# Patient Record
Sex: Female | Born: 1935
Health system: Southern US, Community
[De-identification: ages and names within clinical notes are randomized; demographics above are authoritative.]

## PROBLEM LIST (undated history)

## (undated) DIAGNOSIS — M1990 Unspecified osteoarthritis, unspecified site: Secondary | ICD-10-CM

## (undated) DIAGNOSIS — Z860100 Personal history of colon polyps, unspecified: Secondary | ICD-10-CM

## (undated) DIAGNOSIS — E785 Hyperlipidemia, unspecified: Secondary | ICD-10-CM

## (undated) DIAGNOSIS — D696 Thrombocytopenia, unspecified: Secondary | ICD-10-CM

## (undated) DIAGNOSIS — I10 Essential (primary) hypertension: Secondary | ICD-10-CM

## (undated) DIAGNOSIS — M5416 Radiculopathy, lumbar region: Secondary | ICD-10-CM

## (undated) DIAGNOSIS — Z7901 Long term (current) use of anticoagulants: Secondary | ICD-10-CM

## (undated) DIAGNOSIS — M47812 Spondylosis without myelopathy or radiculopathy, cervical region: Secondary | ICD-10-CM

## (undated) DIAGNOSIS — K5790 Diverticulosis of intestine, part unspecified, without perforation or abscess without bleeding: Secondary | ICD-10-CM

## (undated) DIAGNOSIS — K56609 Unspecified intestinal obstruction, unspecified as to partial versus complete obstruction: Secondary | ICD-10-CM

## (undated) DIAGNOSIS — M10071 Idiopathic gout, right ankle and foot: Secondary | ICD-10-CM

## (undated) DIAGNOSIS — I714 Abdominal aortic aneurysm, without rupture: Secondary | ICD-10-CM

## (undated) DIAGNOSIS — K5730 Diverticulosis of large intestine without perforation or abscess without bleeding: Secondary | ICD-10-CM

## (undated) DIAGNOSIS — E119 Type 2 diabetes mellitus without complications: Secondary | ICD-10-CM

## (undated) DIAGNOSIS — R809 Proteinuria, unspecified: Secondary | ICD-10-CM

## (undated) DIAGNOSIS — I499 Cardiac arrhythmia, unspecified: Secondary | ICD-10-CM

## (undated) DIAGNOSIS — I4891 Unspecified atrial fibrillation: Secondary | ICD-10-CM

## (undated) DIAGNOSIS — R1900 Intra-abdominal and pelvic swelling, mass and lump, unspecified site: Secondary | ICD-10-CM

## (undated) DIAGNOSIS — N2889 Other specified disorders of kidney and ureter: Secondary | ICD-10-CM

## (undated) DIAGNOSIS — N179 Acute kidney failure, unspecified: Secondary | ICD-10-CM

## (undated) HISTORY — DX: Personal history of colonic polyps: Z86.010

## (undated) HISTORY — DX: Unspecified osteoarthritis, unspecified site: M19.90

## (undated) HISTORY — DX: Spondylosis without myelopathy or radiculopathy, cervical region: M47.812

## (undated) HISTORY — DX: Proteinuria, unspecified: R80.9

## (undated) HISTORY — DX: Acute kidney failure, unspecified: N17.9

## (undated) HISTORY — DX: Diverticulosis of large intestine without perforation or abscess without bleeding: K57.30

## (undated) HISTORY — DX: Thrombocytopenia, unspecified: D69.6

## (undated) HISTORY — PX: ABDOMINAL AORTIC ANEURYSM REPAIR: SHX42

## (undated) HISTORY — DX: Intra-abdominal and pelvic swelling, mass and lump, unspecified site: R19.00

## (undated) HISTORY — PX: ABDOMINAL HYSTERECTOMY: SHX81

## (undated) HISTORY — DX: Long term (current) use of anticoagulants: Z79.01

## (undated) HISTORY — DX: Abdominal aortic aneurysm, without rupture: I71.4

## (undated) HISTORY — DX: Radiculopathy, lumbar region: M54.16

## (undated) HISTORY — DX: Unspecified atrial fibrillation: I48.91

## (undated) HISTORY — DX: Type 2 diabetes mellitus without complications: E11.9

## (undated) HISTORY — DX: Unspecified intestinal obstruction, unspecified as to partial versus complete obstruction: K56.609

## (undated) HISTORY — DX: Idiopathic gout, right ankle and foot: M10.071

---

## 2011-05-13 ENCOUNTER — Other Ambulatory Visit (HOSPITAL_COMMUNITY): Payer: Self-pay | Admitting: Urology

## 2011-05-13 DIAGNOSIS — N2889 Other specified disorders of kidney and ureter: Secondary | ICD-10-CM

## 2011-08-30 ENCOUNTER — Ambulatory Visit (HOSPITAL_COMMUNITY)
Admission: RE | Admit: 2011-08-30 | Discharge: 2011-08-30 | Disposition: A | Discharge status: Home / Self Care | Payer: PRIVATE HEALTH INSURANCE | Source: Ambulatory Visit | Attending: Urology | Admitting: Urology

## 2011-08-30 DIAGNOSIS — N289 Disorder of kidney and ureter, unspecified: Secondary | ICD-10-CM | POA: Insufficient documentation

## 2011-08-30 DIAGNOSIS — I714 Abdominal aortic aneurysm, without rupture, unspecified: Secondary | ICD-10-CM | POA: Insufficient documentation

## 2011-08-30 DIAGNOSIS — N2889 Other specified disorders of kidney and ureter: Secondary | ICD-10-CM

## 2011-08-30 MED ORDER — GADOBENATE DIMEGLUMINE 529 MG/ML IV SOLN
17.0000 mL | Freq: Once | INTRAVENOUS | Status: AC
  Administered 2011-08-30: 17 mL via INTRAVENOUS

## 2013-04-16 DIAGNOSIS — R809 Proteinuria, unspecified: Secondary | ICD-10-CM | POA: Insufficient documentation

## 2013-04-16 DIAGNOSIS — Z860100 Personal history of colon polyps, unspecified: Secondary | ICD-10-CM

## 2013-04-16 DIAGNOSIS — M1990 Unspecified osteoarthritis, unspecified site: Secondary | ICD-10-CM | POA: Insufficient documentation

## 2013-04-16 DIAGNOSIS — K5730 Diverticulosis of large intestine without perforation or abscess without bleeding: Secondary | ICD-10-CM

## 2013-04-16 DIAGNOSIS — D696 Thrombocytopenia, unspecified: Secondary | ICD-10-CM

## 2013-04-16 HISTORY — DX: Personal history of colon polyps, unspecified: Z86.0100

## 2013-04-16 HISTORY — DX: Thrombocytopenia, unspecified: D69.6

## 2013-04-16 HISTORY — DX: Diverticulosis of large intestine without perforation or abscess without bleeding: K57.30

## 2013-04-16 HISTORY — DX: Personal history of colonic polyps: Z86.010

## 2013-04-16 HISTORY — DX: Proteinuria, unspecified: R80.9

## 2013-04-16 HISTORY — DX: Unspecified osteoarthritis, unspecified site: M19.90

## 2013-07-10 DIAGNOSIS — Z5320 Procedure and treatment not carried out because of patient's decision for unspecified reasons: Secondary | ICD-10-CM | POA: Insufficient documentation

## 2013-09-15 DIAGNOSIS — I714 Abdominal aortic aneurysm, without rupture, unspecified: Secondary | ICD-10-CM | POA: Insufficient documentation

## 2013-09-15 HISTORY — DX: Abdominal aortic aneurysm, without rupture, unspecified: I71.40

## 2013-09-15 HISTORY — DX: Abdominal aortic aneurysm, without rupture: I71.4

## 2014-01-24 DIAGNOSIS — N2889 Other specified disorders of kidney and ureter: Secondary | ICD-10-CM | POA: Insufficient documentation

## 2014-03-28 DIAGNOSIS — M47812 Spondylosis without myelopathy or radiculopathy, cervical region: Secondary | ICD-10-CM | POA: Insufficient documentation

## 2014-03-28 DIAGNOSIS — M5416 Radiculopathy, lumbar region: Secondary | ICD-10-CM

## 2014-03-28 HISTORY — DX: Radiculopathy, lumbar region: M54.16

## 2014-03-28 HISTORY — DX: Spondylosis without myelopathy or radiculopathy, cervical region: M47.812

## 2014-09-12 ENCOUNTER — Other Ambulatory Visit: Payer: Self-pay | Admitting: Urology

## 2014-09-12 DIAGNOSIS — N2889 Other specified disorders of kidney and ureter: Secondary | ICD-10-CM

## 2014-10-10 ENCOUNTER — Ambulatory Visit
Admission: RE | Admit: 2014-10-10 | Discharge: 2014-10-10 | Disposition: A | Discharge status: Home / Self Care | Payer: Medicare Other | Source: Ambulatory Visit | Attending: Urology | Admitting: Urology

## 2014-10-10 DIAGNOSIS — N2889 Other specified disorders of kidney and ureter: Secondary | ICD-10-CM | POA: Insufficient documentation

## 2014-10-10 HISTORY — DX: Type 2 diabetes mellitus without complications: E11.9

## 2014-10-10 HISTORY — DX: Long term (current) use of anticoagulants: Z79.01

## 2014-10-10 HISTORY — DX: Hyperlipidemia, unspecified: E78.5

## 2014-10-10 HISTORY — DX: Personal history of colon polyps, unspecified: Z86.0100

## 2014-10-10 HISTORY — DX: Cardiac arrhythmia, unspecified: I49.9

## 2014-10-10 HISTORY — DX: Unspecified osteoarthritis, unspecified site: M19.90

## 2014-10-10 HISTORY — DX: Personal history of colonic polyps: Z86.010

## 2014-10-10 HISTORY — DX: Essential (primary) hypertension: I10

## 2014-10-10 HISTORY — DX: Diverticulosis of intestine, part unspecified, without perforation or abscess without bleeding: K57.90

## 2014-10-10 NOTE — Consult Note (Signed)
Chief Complaint: Patient was seen in consultation today for  Chief Complaint  Patient presents with  . Advice Only    Right Renal Mass   at the request of Hall,Marshall C  Referring Physician(s): Hall,Marshall C  History of Present Illness: Chelsea Dean is a 81 y.o. female with a prior history of non-insulin-dependent diabetes, hypertension, hyperlipidemia, and atrial fibrillation. Patient is on chronic Coumadin therapy. Patient has a known posterior exophytic right renal mass. Lesion demonstrates enlargement from 1.6 to  2 cm by surveillance imaging. She remains asymptomatic. No flank or abdominal pain. No hematuria. She presents to discuss cryoablation.  Past Medical History  Diagnosis Date  . Diabetes mellitus without complication   . Hypertension   . Hyperlipidemia   . Dysrhythmia     Atrial fib  . Anticoagulated on Coumadin   . Hx of colonic polyps   . Diverticulosis   . Arthritis     Past Surgical History  Procedure Laterality Date  . Abdominal hysterectomy      Allergies: Ace inhibitors; Contrast media; and Nsaids  Medications: Prior to Admission medications   Medication Sig Start Date End Date Taking? Authorizing Provider  atorvastatin (LIPITOR) 20 MG tablet Take 20 mg by mouth daily.   Yes Historical Provider, MD  diltiazem (TIAZAC) 120 MG 24 hr capsule Take 120 mg by mouth daily.   Yes Historical Provider, MD  glipiZIDE (GLUCOTROL XL) 10 MG 24 hr tablet Take 10 mg by mouth daily with breakfast.   Yes Historical Provider, MD  losartan (COZAAR) 25 MG tablet Take 25 mg by mouth daily.   Yes Historical Provider, MD  metoprolol (LOPRESSOR) 50 MG tablet Take 50 mg by mouth 2 (two) times daily.   Yes Historical Provider, MD  omega-3 acid ethyl esters (LOVAZA) 1 G capsule Take by mouth 2 (two) times daily.   Yes Historical Provider, MD  potassium chloride SA (K-DUR,KLOR-CON) 20 MEQ tablet Take 20 mEq by mouth 2 (two) times daily.   Yes Historical Provider,  MD  warfarin (COUMADIN) 5 MG tablet Take 5 mg by mouth daily.   Yes Historical Provider, MD     No family history on file.  Social History   Social History  . Marital Status: Married    Spouse Name: N/A  . Number of Children: N/A  . Years of Education: N/A   Social History Main Topics  . Smoking status: Never Smoker   . Smokeless tobacco: Current User    Types: Snuff  . Alcohol Use: No  . Drug Use: Not on file  . Sexual Activity: Not on file   Other Topics Concern  . Not on file   Social History Narrative  . No narrative on file     Review of Systems: A 12 point ROS discussed and pertinent positives are indicated in the HPI above.  All other systems are negative.  Review of Systems  Constitutional: Negative for fever, diaphoresis, activity change and fatigue.  Respiratory: Negative for shortness of breath.   Cardiovascular: Negative for chest pain.  Gastrointestinal: Negative for abdominal distention.  Genitourinary: Negative for hematuria and flank pain.    Vital Signs: BP 167/72 mmHg  Pulse 65  Temp(Src) 97.7 F (36.5 C) (Oral)  Resp 13  Ht 5\' 2"  (1.575 m)  SpO2 97%  Physical Exam  Constitutional: She appears well-nourished. No distress.  Frail appearing elderly female using a cane.  Neck:  No carotid bruit.  Cardiovascular: Normal heart  sounds.  Exam reveals no gallop and no friction rub.   No murmur heard. Irregular heartbeat compatible with known atrial fibrillation.  Pulmonary/Chest: Effort normal and breath sounds normal. No respiratory distress. She exhibits no tenderness.  Abdominal: Soft. Bowel sounds are normal. She exhibits no distension and no mass. There is no tenderness.  Skin: She is not diaphoretic.    Imaging: Ultrasound and CT imaging from [facility] demonstrates interval enlargement now measuring 2 cm of a posterior exophytic right renal mass. Lesion is solid and has enhancement by MRI and is consistent with a small renal cell  carcinoma. Size and location are amenable to cryoablation.  Labs:  BMP: Creatinine 0.9, 05/13/2014   Assessment and Plan:  81 year old female with a prior history of diabetes, hypertension, hyperlipidemia, and atrial fibrillation. She presents for evaluation of a small right renal mass consistent with a renal cell carcinoma. Surveillance imaging demonstrates slight interval enlargement from 1.6 cm to approximately 2 cm. The right renal cell carcinoma is posterior and exophytic. Patient remains asymptomatic. No flank pain or hematuria. Patient has a CT contrast allergy therefore surveillance imaging has been performed with MRI.  Today, I had a lengthy discussion with the patient and her daughter regarding the enlarging 2 cm right cell carcinoma. MR imaging was reviewed with the patient and her daughter. Lesion location and size is amenable to image guided cryoablation. The procedure, risks, benefits and alternatives were all reviewed. After discussion she does wish to proceed with cryoablation.  This will be scheduled at [facility] in the next few weeks. She will need a Lovenox bridge for the procedure.  Thank you for this interesting consult.  I greatly enjoyed meeting Daney Revoir and look forward to participating in their care.  A copy of this report was sent to the requesting provider on this date.  SignedGreggory Keen 10/10/2014, 11:07 AM   I spent a total of  30 Minutes   in face to face in clinical consultation, greater than 50% of which was counseling/coordinating care for this patient with a small right renal cell carcinoma.

## 2014-11-08 ENCOUNTER — Other Ambulatory Visit: Payer: Self-pay | Admitting: Radiology

## 2014-11-08 DIAGNOSIS — N2889 Other specified disorders of kidney and ureter: Secondary | ICD-10-CM

## 2014-12-10 ENCOUNTER — Ambulatory Visit
Admission: RE | Admit: 2014-12-10 | Discharge: 2014-12-10 | Disposition: A | Discharge status: Home / Self Care | Payer: Medicare Other | Source: Ambulatory Visit | Attending: Radiology | Admitting: Radiology

## 2014-12-10 DIAGNOSIS — N2889 Other specified disorders of kidney and ureter: Secondary | ICD-10-CM

## 2014-12-10 HISTORY — DX: Other specified disorders of kidney and ureter: N28.89

## 2014-12-10 NOTE — Progress Notes (Signed)
Patient ID: Chelsea Dean, female   DOB: 1927/09/01, 81 y.o.   MRN: [id number]       Chief Complaint: Patient was seen in consultation today for  Chief Complaint  Patient presents with  . Follow-up    1 mo follow up Cryoablation of Right Renal Mass   at the request of [place]  Referring Physician(s): Heather Roberts  History of Present Illness: Chelsea Dean is a 81 y.o. female with a history of non-insulin-dependent diabetes, hypertension, hyperlipidemia, and atrial fibrillation. She most recently underwent right renal cell carcinoma cryoablation at [facility] hospital for a 2 cm right renal mass. She has recovered at home very well. She currently is asymptomatic. No flank pain or abdominal pain. No hematuria or fever. She returns for outpatient postop care.  Past Medical History  Diagnosis Date  . Diabetes mellitus without complication ([place])   . Hypertension   . Hyperlipidemia   . Dysrhythmia     Atrial fib  . Anticoagulated on Coumadin   . Hx of colonic polyps   . Diverticulosis   . Arthritis   . Right renal mass     Past Surgical History  Procedure Laterality Date  . Abdominal hysterectomy      Allergies: Ace inhibitors; Contrast media; and Nsaids  Medications: Prior to Admission medications   Medication Sig Start Date End Date Taking? Authorizing Provider  atorvastatin (LIPITOR) 20 MG tablet Take 20 mg by mouth daily.   Yes Historical Provider, MD  diltiazem (TIAZAC) 120 MG 24 hr capsule Take 120 mg by mouth daily.   Yes Historical Provider, MD  glipiZIDE (GLUCOTROL XL) 10 MG 24 hr tablet Take 10 mg by mouth daily with breakfast.   Yes Historical Provider, MD  losartan (COZAAR) 25 MG tablet Take 25 mg by mouth daily.   Yes Historical Provider, MD  metoprolol (LOPRESSOR) 50 MG tablet Take 50 mg by mouth 2 (two) times daily.   Yes Historical Provider, MD  omega-3 acid ethyl esters (LOVAZA) 1 G capsule Take by mouth 2 (two) times daily.   Yes Historical  Provider, MD  potassium chloride SA (K-DUR,KLOR-CON) 20 MEQ tablet Take 20 mEq by mouth 2 (two) times daily.   Yes Historical Provider, MD  warfarin (COUMADIN) 5 MG tablet Take 5 mg by mouth daily.   Yes Historical Provider, MD     No family history on file.  Social History   Social History  . Marital Status: Married    Spouse Name: N/A  . Number of Children: N/A  . Years of Education: N/A   Social History Main Topics  . Smoking status: Never Smoker   . Smokeless tobacco: Current User    Types: Snuff  . Alcohol Use: No  . Drug Use: Not on file  . Sexual Activity: Not on file   Other Topics Concern  . Not on file   Social History Narrative     Review of Systems: A 12 point ROS discussed and pertinent positives are indicated in the HPI above.  All other systems are negative.  Review of Systems  Constitutional: Negative for fever, chills, diaphoresis, activity change, appetite change and fatigue.  Respiratory: Negative for chest tightness and shortness of breath.   Cardiovascular: Negative for chest pain and palpitations.  Genitourinary: Negative for frequency, hematuria and flank pain.    Vital Signs: BP 149/68 mmHg  Pulse 74  Temp(Src) 97.8 F (36.6 C) (Oral)  Resp 14  SpO2 98%  Physical Exam  Constitutional: She  appears well-developed and well-nourished. She appears distressed.  Elderly female who walks with a cane.  Cardiovascular:  Irregular rate compatible with known atrial fibrillation. No murmur.  Pulmonary/Chest: Effort normal and breath sounds normal. No respiratory distress. She has no wheezes.  Abdominal: Soft. Bowel sounds are normal. She exhibits no distension.  Right flank entry site is well-healed.  Skin: She is not diaphoretic.     Imaging: Initial post ablation imaging will be performed in 2 weeks.   Assessment and Plan:  1 month status post cryoablation of a right renal cell carcinoma. Procedure was performed at [facility]. She recovered overnight in the hospital and was discharged following day. She has recovered at home very well. No current flank or abdominal pain. No hematuria or fever. She is back to her baseline. No current complaints.  Plan: Repeat abdominal MRI without and with contrast at [facility] hospital in 2 months. I will see her back in the office after the MRI to review her imaging.  SignedGreggory Keen 12/10/2014, 2:47 PM   I spent a total of    25 Minutes in face to face in clinical consultation, greater than 50% of which was counseling/coordinating care for this patient with a right renal cell carcinoma, one month status post cryoablation.

## 2015-02-05 DIAGNOSIS — Z7901 Long term (current) use of anticoagulants: Secondary | ICD-10-CM

## 2015-02-05 DIAGNOSIS — Z5181 Encounter for therapeutic drug level monitoring: Secondary | ICD-10-CM | POA: Insufficient documentation

## 2015-02-05 HISTORY — DX: Long term (current) use of anticoagulants: Z79.01

## 2015-02-13 ENCOUNTER — Other Ambulatory Visit: Payer: Self-pay | Admitting: Radiology

## 2015-02-13 DIAGNOSIS — N2889 Other specified disorders of kidney and ureter: Secondary | ICD-10-CM

## 2015-02-26 ENCOUNTER — Other Ambulatory Visit: Payer: Self-pay | Admitting: Interventional Radiology

## 2015-02-26 DIAGNOSIS — N2889 Other specified disorders of kidney and ureter: Secondary | ICD-10-CM

## 2015-03-12 ENCOUNTER — Ambulatory Visit
Admission: RE | Admit: 2015-03-12 | Discharge: 2015-03-12 | Disposition: A | Discharge status: Home / Self Care | Payer: Medicare Other | Source: Ambulatory Visit | Attending: Interventional Radiology | Admitting: Interventional Radiology

## 2015-03-12 DIAGNOSIS — N2889 Other specified disorders of kidney and ureter: Secondary | ICD-10-CM

## 2015-03-12 NOTE — Progress Notes (Signed)
Patient ID: Chelsea Dean, female   DOB: 05/31/1927, 82 y.o.   MRN: [id number]    Chief Complaint: 4 months status post cryoablation of a small right renal cell carcinoma. Outpatient follow-up. Subsequent encounter.  Referring Physician(s): Hall  History of Present Illness: Chelsea Dean is a 82 y.o. female who is now 4 months status post cryoablation of a small right renal cell carcinoma measuring 2 cm. Procedure performed at [facility]. She has recovered at home very well. No current symptoms. No significant abdominal or flank pain. Negative for hematuria or fever. She is back to her baseline. She returns for outpatient follow-up and review of her most recent MRI.  Past Medical History  Diagnosis Date  . Diabetes mellitus without complication ([place])   . Hypertension   . Hyperlipidemia   . Dysrhythmia     Atrial fib  . Anticoagulated on Coumadin   . Hx of colonic polyps   . Diverticulosis   . Arthritis   . Right renal mass     Past Surgical History  Procedure Laterality Date  . Abdominal hysterectomy      Allergies: Ace inhibitors; Contrast media; and Nsaids  Medications: Prior to Admission medications   Medication Sig Start Date End Date Taking? Authorizing Provider  atorvastatin (LIPITOR) 20 MG tablet Take 20 mg by mouth daily.   Yes Historical Provider, MD  diltiazem (TIAZAC) 120 MG 24 hr capsule Take 120 mg by mouth daily.   Yes Historical Provider, MD  glipiZIDE (GLUCOTROL XL) 10 MG 24 hr tablet Take 10 mg by mouth daily with breakfast.   Yes Historical Provider, MD  losartan (COZAAR) 25 MG tablet Take 25 mg by mouth daily.   Yes Historical Provider, MD  metoprolol (LOPRESSOR) 50 MG tablet Take 50 mg by mouth 2 (two) times daily.   Yes Historical Provider, MD  omega-3 acid ethyl esters (LOVAZA) 1 G capsule Take by mouth 2 (two) times daily.   Yes Historical Provider, MD  potassium chloride SA (K-DUR,KLOR-CON) 20 MEQ tablet Take 20 mEq by mouth 2  (two) times daily.   Yes Historical Provider, MD  warfarin (COUMADIN) 5 MG tablet Take 5 mg by mouth daily.   Yes Historical Provider, MD     No family history on file.  Social History   Social History  . Marital Status: Married    Spouse Name: N/A  . Number of Children: N/A  . Years of Education: N/A   Social History Main Topics  . Smoking status: Never Smoker   . Smokeless tobacco: Current User    Types: Snuff  . Alcohol Use: No  . Drug Use: Not on file  . Sexual Activity: Not on file   Other Topics Concern  . Not on file   Social History Narrative    ECOG Status: 0 - Asymptomatic  Review of Systems: A 12 point ROS discussed and pertinent positives are indicated in the HPI above.  All other systems are negative.  Review of Systems  Vital Signs: BP 131/84 mmHg  Pulse 74  Temp(Src) 97.7 F (36.5 C) (Oral)  Resp 14  SpO2 97%  Physical Exam  Constitutional: She appears well-developed and well-nourished. No distress.  Pleasant elderly female. No acute distress.  Cardiovascular: Normal rate and regular rhythm.  Exam reveals no friction rub.   No murmur heard. Pulmonary/Chest: Effort normal and breath sounds normal. No respiratory distress.  Abdominal: Soft. Bowel sounds are normal. She exhibits no distension and no mass. There is  no tenderness.  Skin: Skin is warm and dry. No rash noted. She is not diaphoretic. No erythema.  Psychiatric: She has a normal mood and affect. Her behavior is normal.    Imaging: MRI performed at [facility] 02/28/2015. This demonstrates expected post ablation changes of the posterior right kidney. No residual or recurrent tumor. No evidence of metastatic disease. No delay complication or hydronephrosis.  Labs:  CBC: No results for input(s): WBC, HGB, HCT, PLT in the last 8760 hours.  COAGS: No results for input(s): INR, APTT in the last 8760 hours.  BMP: No results for input(s): NA, K, CL, CO2, GLUCOSE, BUN,  CALCIUM, CREATININE, GFRNONAA, GFRAA in the last 8760 hours.  Invalid input(s): CMP  LIVER FUNCTION TESTS: No results for input(s): BILITOT, AST, ALT, ALKPHOS, PROT, ALBUMIN in the last 8760 hours.  TUMOR MARKERS: No results for input(s): AFPTM, CEA, CA199, CHROMGRNA in the last 8760 hours.  Assessment and Plan:  4 months status post CT-guided cryoablation of a small exophytic right renal cell carcinoma. Post treatment MRI demonstrates expected ablation changes. No evidence of residual or recurrent tumor. No delay complication. No hydronephrosis. Overall she is doing very well.  Plan: Outpatient follow-up in 6 months with a repeat MRI without and with contrast.  Thank you for this interesting consult.  I greatly enjoyed meeting Damion Burgoon and look forward to participating in their care.  A copy of this report was sent to the requesting provider on this date.  Electronically Signed: Greggory Keen 03/12/2015, 1:59 PM   I spent a total of    15 Minutes in face to face in clinical consultation, greater than 50% of which was counseling/coordinating care for status post ablation of a small right renal cell carcinoma.

## 2015-06-19 ENCOUNTER — Other Ambulatory Visit: Payer: Self-pay | Admitting: Surgery

## 2015-06-19 DIAGNOSIS — IMO0002 Reserved for concepts with insufficient information to code with codable children: Secondary | ICD-10-CM | POA: Insufficient documentation

## 2015-06-19 DIAGNOSIS — T82330A Leakage of aortic (bifurcation) graft (replacement), initial encounter: Secondary | ICD-10-CM

## 2015-07-01 ENCOUNTER — Ambulatory Visit
Admission: RE | Admit: 2015-07-01 | Discharge: 2015-07-01 | Disposition: A | Discharge status: Home / Self Care | Payer: Medicare Other | Source: Ambulatory Visit | Attending: Surgery | Admitting: Surgery

## 2015-07-01 DIAGNOSIS — T82330A Leakage of aortic (bifurcation) graft (replacement), initial encounter: Secondary | ICD-10-CM

## 2015-07-01 DIAGNOSIS — IMO0002 Reserved for concepts with insufficient information to code with codable children: Secondary | ICD-10-CM

## 2015-07-01 HISTORY — PX: IR GENERIC HISTORICAL: IMG1180011

## 2015-07-01 NOTE — Consult Note (Signed)
Chief Complaint: Patient was seen in consultation today for  Chief Complaint  Patient presents with  . Advice Only    Type 2 Endoleak (s/p EVAR)   at the request of Cruz,Nestor Jr.  Referring Physician(s): [employer].  History of Present Illness: Chelsea Dean is a 82 y.o. female with a history of abdominal aortic aneurysm previously treated with endovascular aortic repair (Dr. Janet Berlin in January 2012) using a bifurcated Endograft. 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  Initial surveillance showed stability of the excluded aneurysm sac and no further intervention was performed. However, on the most recent CTA of the abdomen and pelvis performed 06/13/2015 the aneurysm sac measures 6.7 x 6.3 cm each is a significant enlargement compared to 5.4 cm in 2012.  Chelsea Dean now presents at the kind referral of Dr. Denyce Robert for further evaluation and management.  Chelsea Dean is asymptomatic. She denies abdominal or back pain.  Her renal function is normal although she does have a contrast allergy. She has a history of atrial fibrillation for which she is on chronic Coumadin therapy.  Past Medical History  Diagnosis Date  . Diabetes mellitus without complication ([place])   . Hypertension   . Hyperlipidemia   . Dysrhythmia     Atrial fib  . Anticoagulated on Coumadin   . Hx of colonic polyps   . Diverticulosis   . Arthritis   . Right renal mass     Past Surgical History  Procedure Laterality Date  . Abdominal hysterectomy      Allergies: Ace inhibitors; Contrast media; and Nsaids  Medications: Prior to Admission medications   Medication Sig Start Date End Date Taking? Authorizing Provider  atorvastatin (LIPITOR) 20 MG tablet Take 20 mg by mouth daily.   Yes Historical Provider, MD  diltiazem (CARDIZEM CD) 120 MG 24 hr capsule Take 120 mg by mouth daily.   Yes Historical Provider, MD    diltiazem (TIAZAC) 120 MG 24 hr capsule Take 120 mg by mouth daily.   Yes Historical Provider, MD  DULoxetine (CYMBALTA) 60 MG capsule Take 60 mg by mouth daily.   Yes Historical Provider, MD  glipiZIDE (GLUCOTROL XL) 10 MG 24 hr tablet Take 10 mg by mouth daily with breakfast.   Yes Historical Provider, MD  losartan (COZAAR) 25 MG tablet Take 25 mg by mouth daily.   Yes Historical Provider, MD  metoprolol (LOPRESSOR) 50 MG tablet Take 50 mg by mouth 2 (two) times daily.   Yes Historical Provider, MD  omega-3 acid ethyl esters (LOVAZA) 1 G capsule Take by mouth 2 (two) times daily.   Yes Historical Provider, MD  potassium chloride SA (K-DUR,KLOR-CON) 20 MEQ tablet Take 20 mEq by mouth 2 (two) times daily.   Yes Historical Provider, MD  warfarin (COUMADIN) 5 MG tablet Take 5 mg by mouth daily.   Yes Historical Provider, MD     No family history on file.  Social History   Social History  . Marital Status: Married    Spouse Name: N/A  . Number of Children: N/A  . Years of Education: N/A   Social History Main Topics  . Smoking status: Never Smoker   . Smokeless tobacco: Current User    Types: Snuff  . Alcohol Use: No  . Drug Use: Not on file  . Sexual Activity: Not on file   Other Topics Concern  . Not on file  Social History Narrative    Review of Systems: A 12 point ROS discussed and pertinent positives are indicated in the HPI above.  All other systems are negative.  Review of Systems  Vital Signs: BP 178/100 mmHg  Pulse 70  Temp(Src) 98.2 F (36.8 C) (Oral)  Resp 14  Ht 5\' 2"  (1.575 m)  Wt 170 lb (77.111 kg)  BMI 31.09 kg/m2  SpO2 99%  Physical Exam  Constitutional: She is oriented to person, place, and time. She appears well-developed and well-nourished. No distress.  Eyes: No scleral icterus.  Cardiovascular: Normal rate.   Pulmonary/Chest: Effort normal.  Abdominal: Soft. She exhibits no distension. There is no tenderness.  Neurological: She is alert and  oriented to person, place, and time.  Skin: Skin is warm and dry.  Psychiatric: She has a normal mood and affect. Her behavior is normal.  Nursing note and vitals reviewed.   Imaging: No results found.  Labs:  CBC: No results for input(s): WBC, HGB, HCT, PLT in the last 8760 hours.  COAGS: No results for input(s): INR, APTT in the last 8760 hours.  BMP: No results for input(s): NA, K, CL, CO2, GLUCOSE, BUN, CALCIUM, CREATININE, GFRNONAA, GFRAA in the last 8760 hours.  Invalid input(s): CMP  LIVER FUNCTION TESTS: No results for input(s): BILITOT, AST, ALT, ALKPHOS, PROT, ALBUMIN in the last 8760 hours.  TUMOR MARKERS: No results for input(s): AFPTM, CEA, CA199, CHROMGRNA in the last 8760 hours.  Assessment and Plan:  Pleasant 82 year old female with a significant enlargement of her previously treated abdominal aortic aneurysm when compared to prior imaging from 2012.  Her aneurysm now measures up to 6.7 cm which poses a significant risk for spontaneous rupture.  She has evidence of contrast enhancement within the excluded aneurysm sac likely secondary to a type II endoleak. While the endoleak source is not confirmed on her recent CTA imaging, a potential candidate vessels including the IMA and the L4 lumbar arteries, particularly the left L4 lumbar artery.  The risks including procedure failure, arterial injury, hematoma, pseudoaneurysm and nontarget embolization were discussed in detail as were the alternatives and the potential for a plan B (direct sac puncture under general anesthesia).  1.) We will arrange for arteriogram and probable endovascular repair of presumed type II endoleak. Ideally it would be best for the patient if this could be performed at [facility] but that will require some special supplies not typically in stock at that facility.  Additionally, patient will require bridging of Coumadin therapy with Lovenox as well as 13 hour premedication for  contrast allergy prior to the procedure.  If necessary, we will perform the procedure at [facility].  Thank you for this interesting consult.  I greatly enjoyed meeting Chelsea Dean and look forward to participating in their care.  A copy of this report was sent to the requesting provider on this date.  Electronically Signed: Jacqulynn Cadet 07/01/2015, 4:00 PM   I spent a total of  40 Minutes in face to face in clinical consultation, greater than 50% of which was counseling/coordinating care for AAA complicated by endoleak

## 2015-07-09 ENCOUNTER — Encounter (HOSPITAL_BASED_OUTPATIENT_CLINIC_OR_DEPARTMENT_OTHER): Payer: Self-pay | Admitting: Emergency Medicine

## 2015-07-09 ENCOUNTER — Emergency Department (HOSPITAL_BASED_OUTPATIENT_CLINIC_OR_DEPARTMENT_OTHER): Payer: Medicare Other

## 2015-07-09 ENCOUNTER — Emergency Department (HOSPITAL_BASED_OUTPATIENT_CLINIC_OR_DEPARTMENT_OTHER)
Admission: EM | Admit: 2015-07-09 | Discharge: 2015-07-09 | Disposition: A | Discharge status: Home / Self Care | Payer: Medicare Other | Attending: Emergency Medicine | Admitting: Emergency Medicine

## 2015-07-09 DIAGNOSIS — I10 Essential (primary) hypertension: Secondary | ICD-10-CM | POA: Insufficient documentation

## 2015-07-09 DIAGNOSIS — M545 Low back pain, unspecified: Secondary | ICD-10-CM

## 2015-07-09 DIAGNOSIS — E119 Type 2 diabetes mellitus without complications: Secondary | ICD-10-CM | POA: Diagnosis not present

## 2015-07-09 DIAGNOSIS — E785 Hyperlipidemia, unspecified: Secondary | ICD-10-CM | POA: Diagnosis not present

## 2015-07-09 DIAGNOSIS — Z7901 Long term (current) use of anticoagulants: Secondary | ICD-10-CM | POA: Diagnosis not present

## 2015-07-09 DIAGNOSIS — Z79899 Other long term (current) drug therapy: Secondary | ICD-10-CM | POA: Insufficient documentation

## 2015-07-09 DIAGNOSIS — Z7984 Long term (current) use of oral hypoglycemic drugs: Secondary | ICD-10-CM | POA: Insufficient documentation

## 2015-07-09 DIAGNOSIS — M1990 Unspecified osteoarthritis, unspecified site: Secondary | ICD-10-CM | POA: Diagnosis not present

## 2015-07-09 NOTE — Discharge Instructions (Signed)

## 2015-07-09 NOTE — ED Provider Notes (Signed)
CSN: [id number]     Arrival date & time 07/09/15  1706 History   First MD Initiated Contact with Patient 07/09/15 1724     Chief Complaint  Patient presents with  . Back Pain     HPI   82 year old female presents today with complaints of lower back pain. Patient reports that this morning she was lifting up a basket of close when she felt a sharp pain in her right lower lumbar region. Patient reports that she is never felt pain in this area previously. Patient denies any radiation of symptoms. She notes that pain is only present when she goes from sitting to standing or moving. Patient denies any pain at rest, denies any lower extremity sensory or strength deficits, denies any saddle anesthesia, or changes in bowel or bladder functioning. Patient reports that she is able to ambulate without difficulty without significant pain. Patient denies any abdominal pain, chest pain, shortness of breath, or cool extremities. Patient denies any fever, chills, nausea, vomiting. Patient denies any red flags for back pain. Patient has significant past medical history of AAA that she will be going to have surgery on. Patient has remained asymptomatic from this AAA, she has a stent in place that appears to be bleeding.  Past Medical History  Diagnosis Date  . Diabetes mellitus without complication ([place])   . Hypertension   . Hyperlipidemia   . Dysrhythmia     Atrial fib  . Anticoagulated on Coumadin   . Hx of colonic polyps   . Diverticulosis   . Arthritis   . Right renal mass    Past Surgical History  Procedure Laterality Date  . Abdominal hysterectomy     History reviewed. No pertinent family history. Social History  Substance Use Topics  . Smoking status: Never Smoker   . Smokeless tobacco: Current User    Types: Snuff  . Alcohol Use: No   OB History    No data available     Review of Systems  All other systems reviewed and are negative.   Allergies  Ace inhibitors; Contrast media; and  Nsaids  Home Medications   Prior to Admission medications   Medication Sig Start Date End Date Taking? Authorizing Provider  atorvastatin (LIPITOR) 20 MG tablet Take 20 mg by mouth daily.   Yes Historical Provider, MD  diltiazem (CARDIZEM CD) 120 MG 24 hr capsule Take 120 mg by mouth daily.   Yes Historical Provider, MD  diltiazem (TIAZAC) 120 MG 24 hr capsule Take 120 mg by mouth daily.   Yes Historical Provider, MD  glipiZIDE (GLUCOTROL XL) 10 MG 24 hr tablet Take 10 mg by mouth daily with breakfast.   Yes Historical Provider, MD  losartan (COZAAR) 25 MG tablet Take 25 mg by mouth daily.   Yes Historical Provider, MD  metoprolol (LOPRESSOR) 50 MG tablet Take 50 mg by mouth 2 (two) times daily.   Yes Historical Provider, MD  omega-3 acid ethyl esters (LOVAZA) 1 G capsule Take by mouth 2 (two) times daily.   Yes Historical Provider, MD  potassium chloride SA (K-DUR,KLOR-CON) 20 MEQ tablet Take 20 mEq by mouth 2 (two) times daily.   Yes Historical Provider, MD  warfarin (COUMADIN) 5 MG tablet Take 5 mg by mouth daily.   Yes Historical Provider, MD   BP 120/92 mmHg  Pulse 86  Temp(Src) 98.6 F (37 C) (Oral)  Resp 20  Ht 5\' 2"  (1.575 m)  Wt 77.111 kg  BMI 31.09 kg/m2  SpO2  97%   Physical Exam  Constitutional: She is oriented to person, place, and time. She appears well-developed and well-nourished. No distress.  HENT:  Head: Normocephalic and atraumatic.  Eyes: Conjunctivae are normal. Pupils are equal, round, and reactive to light. Right eye exhibits no discharge. Left eye exhibits no discharge. No scleral icterus.  Neck: Normal range of motion. Neck supple. No JVD present. No tracheal deviation present.  Pulmonary/Chest: Effort normal. No stridor.  Musculoskeletal: Normal range of motion. She exhibits tenderness. She exhibits no edema.  No C, T, or L spine tenderness to palpation. No obvious signs of trauma, deformity, infection, step-offs. Lung expansion normal. No scoliosis or  kyphosis. Bilateral lower extremity strength 5 out of 5, sensation grossly intact, patellar reflexes 2+, Refill less than 3 seconds. Bilateral UE/LE pulses equal and 2 plus   Straight leg negative Ambulates without difficulty  Neurological: She is alert and oriented to person, place, and time. Coordination normal.  Skin: Skin is warm and dry. She is not diaphoretic.  Psychiatric: She has a normal mood and affect. Her behavior is normal. Judgment and thought content normal.  Nursing note and vitals reviewed.   ED Course  Procedures (including critical care time) Labs Review Labs Reviewed - No data to display  Imaging Review Dg Lumbar Spine Complete  07/09/2015  CLINICAL DATA:  Low back pain since this morning. EXAM: LUMBAR SPINE - COMPLETE 4+ VIEW COMPARISON:  None. FINDINGS: There are 5 nonrib bearing lumbar-type vertebral bodies. The vertebral body heights are maintained.The alignment is anatomic. There is no spondylolysis. There is no static listhesis.There is no acute fracture. There is degenerative disc disease with disc height loss at L5-S1. There is bilateral facet arthropathy at L4-5 and L5-S1. The SI joints are unremarkable. There is an aorto bi-iliac stent graft present. IMPRESSION: No acute osseous injury of the lumbar spine. Electronically Signed   By: Kathreen Devoid   On: 07/09/2015 18:15   I have personally reviewed and evaluated these images and lab results as part of my medical decision-making.   EKG Interpretation None      MDM   Final diagnoses:  Right-sided low back pain without sciatica    Labs:  Imaging: DG lumbar complete  Consults:  Therapeutics:  Discharge Meds:   Assessment/Plan: A [age]-year-old female presents today with acute onset of back pain. Patients pain is unilateral, worse with movements, no pain at baseline, she has no abdominal pain, peripheral pulses equal bilateral. This is likely muscular back pain from lifting closer earlier. Patient does  have a history of AAA, she has no abdominal pain, hypertension, dizziness, or any other concerning signs or symptoms. Patient will be discharged home with symptomatic care instructions and strict return precautions. Patient care was shared with Alfonzo Beers M.D. who agreed to my assessment and plan        Okey Regal, PA-C 07/10/15 0015  Okey Regal, PA-C 07/10/15 0022  Alfonzo Beers, MD 07/10/15 [phone]

## 2015-07-09 NOTE — ED Notes (Signed)
Pt states she picked up basket of close this am and developed severe lower back, pt getting ready to have AAA surgery when approved by md

## 2015-07-23 ENCOUNTER — Other Ambulatory Visit: Payer: Self-pay | Admitting: *Deleted

## 2015-07-23 DIAGNOSIS — C641 Malignant neoplasm of right kidney, except renal pelvis: Secondary | ICD-10-CM

## 2015-07-30 ENCOUNTER — Telehealth: Payer: Self-pay | Admitting: Radiology

## 2015-07-30 DIAGNOSIS — T82330A Leakage of aortic (bifurcation) graft (replacement), initial encounter: Secondary | ICD-10-CM

## 2015-07-30 DIAGNOSIS — IMO0001 Reserved for inherently not codable concepts without codable children: Secondary | ICD-10-CM

## 2015-07-30 MED ORDER — DIPHENHYDRAMINE HCL 25 MG PO CAPS: 50.0000 mg | ORAL_CAPSULE | Freq: Once | ORAL | Status: DC

## 2015-07-30 MED ORDER — PREDNISONE 50 MG PO TABS: ORAL_TABLET | ORAL | Status: DC

## 2015-07-30 NOTE — Telephone Encounter (Signed)
Patient received instructions re:  Lovenox bridge from Dian Situ at [facility] for IR procedure (Endoleak Repair) scheduled for 08/14/2015).    Patient requires 13 prep prior to Endoleak repair.  Phoned patient's daughter, [name] to review instructions for 13 hr prep.     13 hr prep called to [employer], [place] [place] Legrand Como, PhD).  Amour Trigg Riki Rusk, [place] 07/30/2015 3:19 PM

## 2015-11-18 ENCOUNTER — Encounter: Payer: Self-pay | Admitting: Surgery

## 2015-11-21 DIAGNOSIS — H6120 Impacted cerumen, unspecified ear: Secondary | ICD-10-CM | POA: Insufficient documentation

## 2016-01-16 DIAGNOSIS — H903 Sensorineural hearing loss, bilateral: Secondary | ICD-10-CM | POA: Insufficient documentation

## 2016-02-03 ENCOUNTER — Other Ambulatory Visit: Payer: Self-pay | Admitting: Radiology

## 2016-02-03 ENCOUNTER — Other Ambulatory Visit: Payer: Self-pay | Admitting: *Deleted

## 2016-02-03 ENCOUNTER — Other Ambulatory Visit (HOSPITAL_COMMUNITY): Payer: Self-pay | Admitting: Interventional Radiology

## 2016-02-03 DIAGNOSIS — IMO0001 Reserved for inherently not codable concepts without codable children: Secondary | ICD-10-CM

## 2016-02-03 DIAGNOSIS — T82330A Leakage of aortic (bifurcation) graft (replacement), initial encounter: Secondary | ICD-10-CM

## 2016-02-03 DIAGNOSIS — IMO0002 Reserved for concepts with insufficient information to code with codable children: Secondary | ICD-10-CM

## 2016-02-03 MED ORDER — DIPHENHYDRAMINE HCL 25 MG PO CAPS: 50.0000 mg | ORAL_CAPSULE | Freq: Once | ORAL | 0 refills | Status: DC

## 2016-02-03 MED ORDER — PREDNISONE 50 MG PO TABS: ORAL_TABLET | ORAL | 0 refills | Status: DC

## 2016-02-25 ENCOUNTER — Other Ambulatory Visit: Payer: Medicare Other

## 2016-02-25 ENCOUNTER — Telehealth: Payer: Self-pay | Admitting: Radiology

## 2016-02-25 NOTE — Telephone Encounter (Signed)
Dr Jacqulynn Cadet phoned [name] (patient's daughter) to review resultts of CT Angio of 02/12/2016 for 6 mo follow upType 2 Endoleak repair s/p EVAR.    Next follow up to be scheduled for late June 2018 repeat CT Angio and app't w/ Dr Laurence Ferrari.  Jediah Horger Riki Rusk, RN 02/25/2016 9:28 AMf

## 2016-07-28 ENCOUNTER — Other Ambulatory Visit: Payer: Self-pay | Admitting: *Deleted

## 2016-07-28 DIAGNOSIS — Z8679 Personal history of other diseases of the circulatory system: Secondary | ICD-10-CM

## 2016-08-09 ENCOUNTER — Other Ambulatory Visit: Payer: Self-pay | Admitting: *Deleted

## 2016-08-09 DIAGNOSIS — Z8679 Personal history of other diseases of the circulatory system: Secondary | ICD-10-CM

## 2016-08-24 ENCOUNTER — Other Ambulatory Visit: Payer: Self-pay | Admitting: Radiology

## 2016-08-24 DIAGNOSIS — Z91041 Radiographic dye allergy status: Secondary | ICD-10-CM

## 2016-08-24 DIAGNOSIS — T82330A Leakage of aortic (bifurcation) graft (replacement), initial encounter: Secondary | ICD-10-CM

## 2016-08-24 DIAGNOSIS — IMO0001 Reserved for inherently not codable concepts without codable children: Secondary | ICD-10-CM

## 2016-08-24 MED ORDER — PREDNISONE 50 MG PO TABS: ORAL_TABLET | ORAL | 0 refills | Status: DC

## 2016-08-24 MED ORDER — DIPHENHYDRAMINE HCL 25 MG PO CAPS: 50.0000 mg | ORAL_CAPSULE | Freq: Once | ORAL | 0 refills | Status: DC

## 2016-10-18 ENCOUNTER — Inpatient Hospital Stay (HOSPITAL_BASED_OUTPATIENT_CLINIC_OR_DEPARTMENT_OTHER)
Admission: EM | Admit: 2016-10-18 | Discharge: 2016-10-27 | DRG: 389 | Disposition: A | Discharge status: Skilled Nursing Facility | Payer: Medicare Other | Attending: Internal Medicine | Admitting: Internal Medicine

## 2016-10-18 ENCOUNTER — Emergency Department (HOSPITAL_BASED_OUTPATIENT_CLINIC_OR_DEPARTMENT_OTHER): Payer: Medicare Other

## 2016-10-18 ENCOUNTER — Inpatient Hospital Stay (HOSPITAL_BASED_OUTPATIENT_CLINIC_OR_DEPARTMENT_OTHER): Payer: Medicare Other

## 2016-10-18 ENCOUNTER — Encounter (HOSPITAL_BASED_OUTPATIENT_CLINIC_OR_DEPARTMENT_OTHER): Payer: Self-pay

## 2016-10-18 DIAGNOSIS — Z90710 Acquired absence of both cervix and uterus: Secondary | ICD-10-CM

## 2016-10-18 DIAGNOSIS — R1900 Intra-abdominal and pelvic swelling, mass and lump, unspecified site: Secondary | ICD-10-CM | POA: Diagnosis present

## 2016-10-18 DIAGNOSIS — R109 Unspecified abdominal pain: Secondary | ICD-10-CM | POA: Diagnosis not present

## 2016-10-18 DIAGNOSIS — Z800 Family history of malignant neoplasm of digestive organs: Secondary | ICD-10-CM

## 2016-10-18 DIAGNOSIS — K56609 Unspecified intestinal obstruction, unspecified as to partial versus complete obstruction: Secondary | ICD-10-CM

## 2016-10-18 DIAGNOSIS — M10071 Idiopathic gout, right ankle and foot: Secondary | ICD-10-CM | POA: Diagnosis present

## 2016-10-18 DIAGNOSIS — E861 Hypovolemia: Secondary | ICD-10-CM | POA: Diagnosis present

## 2016-10-18 DIAGNOSIS — E876 Hypokalemia: Secondary | ICD-10-CM | POA: Diagnosis present

## 2016-10-18 DIAGNOSIS — I482 Chronic atrial fibrillation: Secondary | ICD-10-CM | POA: Diagnosis not present

## 2016-10-18 DIAGNOSIS — I4891 Unspecified atrial fibrillation: Secondary | ICD-10-CM | POA: Diagnosis present

## 2016-10-18 DIAGNOSIS — E785 Hyperlipidemia, unspecified: Secondary | ICD-10-CM | POA: Diagnosis present

## 2016-10-18 DIAGNOSIS — Z79899 Other long term (current) drug therapy: Secondary | ICD-10-CM

## 2016-10-18 DIAGNOSIS — Z888 Allergy status to other drugs, medicaments and biological substances status: Secondary | ICD-10-CM

## 2016-10-18 DIAGNOSIS — Z886 Allergy status to analgesic agent status: Secondary | ICD-10-CM

## 2016-10-18 DIAGNOSIS — Z85528 Personal history of other malignant neoplasm of kidney: Secondary | ICD-10-CM | POA: Diagnosis not present

## 2016-10-18 DIAGNOSIS — Z0189 Encounter for other specified special examinations: Secondary | ICD-10-CM

## 2016-10-18 DIAGNOSIS — N9489 Other specified conditions associated with female genital organs and menstrual cycle: Secondary | ICD-10-CM | POA: Diagnosis present

## 2016-10-18 DIAGNOSIS — R188 Other ascites: Secondary | ICD-10-CM | POA: Diagnosis present

## 2016-10-18 DIAGNOSIS — N179 Acute kidney failure, unspecified: Secondary | ICD-10-CM

## 2016-10-18 DIAGNOSIS — E119 Type 2 diabetes mellitus without complications: Secondary | ICD-10-CM | POA: Diagnosis present

## 2016-10-18 DIAGNOSIS — Z87440 Personal history of urinary (tract) infections: Secondary | ICD-10-CM

## 2016-10-18 DIAGNOSIS — Z7984 Long term (current) use of oral hypoglycemic drugs: Secondary | ICD-10-CM

## 2016-10-18 DIAGNOSIS — F17290 Nicotine dependence, other tobacco product, uncomplicated: Secondary | ICD-10-CM | POA: Diagnosis present

## 2016-10-18 DIAGNOSIS — I10 Essential (primary) hypertension: Secondary | ICD-10-CM | POA: Diagnosis present

## 2016-10-18 DIAGNOSIS — Z7901 Long term (current) use of anticoagulants: Secondary | ICD-10-CM

## 2016-10-18 DIAGNOSIS — Z86010 Personal history of colonic polyps: Secondary | ICD-10-CM

## 2016-10-18 DIAGNOSIS — M1990 Unspecified osteoarthritis, unspecified site: Secondary | ICD-10-CM | POA: Diagnosis present

## 2016-10-18 DIAGNOSIS — R791 Abnormal coagulation profile: Secondary | ICD-10-CM | POA: Diagnosis not present

## 2016-10-18 DIAGNOSIS — E872 Acidosis: Secondary | ICD-10-CM | POA: Diagnosis present

## 2016-10-18 DIAGNOSIS — R269 Unspecified abnormalities of gait and mobility: Secondary | ICD-10-CM | POA: Diagnosis not present

## 2016-10-18 DIAGNOSIS — I714 Abdominal aortic aneurysm, without rupture: Secondary | ICD-10-CM | POA: Diagnosis present

## 2016-10-18 DIAGNOSIS — Z91041 Radiographic dye allergy status: Secondary | ICD-10-CM

## 2016-10-18 HISTORY — DX: Unspecified intestinal obstruction, unspecified as to partial versus complete obstruction: K56.609

## 2016-10-18 HISTORY — DX: Acute kidney failure, unspecified: N17.9

## 2016-10-18 LAB — CBC WITH DIFFERENTIAL/PLATELET
Basophils Absolute: 0 10*3/uL (ref 0.0–0.1)
Basophils Relative: 0 %
EOS ABS: 0 10*3/uL (ref 0.0–0.7)
Eosinophils Relative: 0 %
HCT: 42.7 % (ref 36.0–46.0)
HEMOGLOBIN: 13.4 g/dL (ref 12.0–15.0)
LYMPHS ABS: 0.6 10*3/uL — AB (ref 0.7–4.0)
LYMPHS PCT: 7 %
MCH: 25.7 pg — AB (ref 26.0–34.0)
MCHC: 31.4 g/dL (ref 30.0–36.0)
MCV: 81.8 fL (ref 78.0–100.0)
Monocytes Absolute: 1.7 10*3/uL — ABNORMAL HIGH (ref 0.1–1.0)
Monocytes Relative: 18 %
Neutro Abs: 7.2 10*3/uL (ref 1.7–7.7)
Neutrophils Relative %: 75 %
Platelets: 124 10*3/uL — ABNORMAL LOW (ref 150–400)
RBC: 5.22 MIL/uL — AB (ref 3.87–5.11)
RDW: 14.6 % (ref 11.5–15.5)
WBC: 9.6 10*3/uL (ref 4.0–10.5)

## 2016-10-18 LAB — URINALYSIS, MICROSCOPIC (REFLEX): RBC / HPF: NONE SEEN RBC/hpf (ref 0–5)

## 2016-10-18 LAB — PROTIME-INR
INR: 2.68
PROTHROMBIN TIME: 28.3 s — AB (ref 11.4–15.2)

## 2016-10-18 LAB — COMPREHENSIVE METABOLIC PANEL
ALT: 13 U/L — ABNORMAL LOW (ref 14–54)
ANION GAP: 12 (ref 5–15)
AST: 27 U/L (ref 15–41)
Albumin: 3.4 g/dL — ABNORMAL LOW (ref 3.5–5.0)
Alkaline Phosphatase: 62 U/L (ref 38–126)
BUN: 39 mg/dL — ABNORMAL HIGH (ref 6–20)
CHLORIDE: 94 mmol/L — AB (ref 101–111)
CO2: 25 mmol/L (ref 22–32)
CREATININE: 2.29 mg/dL — AB (ref 0.44–1.00)
Calcium: 9.9 mg/dL (ref 8.9–10.3)
GFR, EST AFRICAN AMERICAN: 21 mL/min — AB (ref >60)
GFR, EST NON AFRICAN AMERICAN: 18 mL/min — AB (ref >60)
Glucose, Bld: 187 mg/dL — ABNORMAL HIGH (ref 65–99)
POTASSIUM: 4.2 mmol/L (ref 3.5–5.1)
Sodium: 131 mmol/L — ABNORMAL LOW (ref 135–145)
Total Bilirubin: 1.1 mg/dL (ref 0.3–1.2)
Total Protein: 6.9 g/dL (ref 6.5–8.1)

## 2016-10-18 LAB — URINALYSIS, ROUTINE W REFLEX MICROSCOPIC
Glucose, UA: NEGATIVE mg/dL
HGB URINE DIPSTICK: NEGATIVE
KETONES UR: 15 mg/dL — AB
Leukocytes, UA: NEGATIVE
NITRITE: NEGATIVE
PH: 5.5 (ref 5.0–8.0)
Protein, ur: 100 mg/dL — AB
Specific Gravity, Urine: >1.03 — ABNORMAL HIGH (ref 1.005–1.030)

## 2016-10-18 LAB — I-STAT CG4 LACTIC ACID, ED: Lactic Acid, Venous: 3.34 mmol/L (ref 0.5–1.9)

## 2016-10-18 MED ORDER — PIPERACILLIN-TAZOBACTAM IN DEX 2-0.25 GM/50ML IV SOLN
2.2500 g | Freq: Four times a day (QID) | INTRAVENOUS | Status: DC
  Administered 2016-10-19 – 2016-10-20 (×4): 2.25 g via INTRAVENOUS
  Filled 2016-10-18 (×7): qty 50

## 2016-10-18 MED ORDER — SODIUM CHLORIDE 0.9 % IV SOLN
INTRAVENOUS | Status: AC
  Administered 2016-10-19 (×2): via INTRAVENOUS

## 2016-10-18 MED ORDER — FENTANYL CITRATE (PF) 100 MCG/2ML IJ SOLN
50.0000 ug | Freq: Once | INTRAMUSCULAR | Status: AC
  Administered 2016-10-18: 50 ug via INTRAVENOUS
  Filled 2016-10-18: qty 2

## 2016-10-18 MED ORDER — SODIUM CHLORIDE 0.9 % IV BOLUS (SEPSIS)
500.0000 mL | Freq: Once | INTRAVENOUS | Status: AC
  Administered 2016-10-18: 500 mL via INTRAVENOUS

## 2016-10-18 MED ORDER — FENTANYL CITRATE (PF) 100 MCG/2ML IJ SOLN
50.0000 ug | Freq: Once | INTRAMUSCULAR | Status: AC
  Administered 2016-10-20: 50 ug via INTRAVENOUS
  Filled 2016-10-18: qty 2

## 2016-10-18 MED ORDER — PIPERACILLIN-TAZOBACTAM 3.375 G IVPB 30 MIN
3.3750 g | Freq: Once | INTRAVENOUS | Status: AC
  Administered 2016-10-18: 3.375 g via INTRAVENOUS
  Filled 2016-10-18 (×2): qty 50

## 2016-10-18 NOTE — Plan of Care (Signed)
Transfer from [facility]: 83 yo F with abd pain.  Has SBO, AKI, ascites with suspected intra-abd infection.  Also has a 16cm cystic ovarian mass, but this isnt really changed in size for several years now according to Gyn-onc's note.  Also aortic aneurysm but that's been stented (also looks stable and unchanged).  Initial BP 70s, but has been fine ever since in ED without any treatment needed.  IVF, NGT, got 1 dose of zosyn in ED but that was before SBO was found.  Not tachy with HR 88 now.  Will go ahead and send patient to inpatient med surg.

## 2016-10-18 NOTE — ED Notes (Signed)
EDP at [facility] with [place] to pt's abd-daughter at [facility]

## 2016-10-18 NOTE — ED Notes (Signed)
Received patient from [place], [place] from Rm [facility]

## 2016-10-18 NOTE — ED Provider Notes (Signed)
[facility] Provider Note   CSN: [id number] Arrival date & time: 10/18/16  2110     History   Chief Complaint Chief Complaint  Patient presents with  . Abdominal Pain    HPI Chelsea Dean is a 83 y.o. female.  HPI  83 year old female with a history of atrial fibrillation on Coumadin, diabetes, and hypertension presents with abdominal pain and distention. She's been having abdominal pain for about one week. Initially thought it was a kidney infection. Was treated as such by her PCP. Went to [facility] about 24 hours ago. She had a CT scan and lab work performed. The CT scan showed a large mass and she was referred to [place]. Since leaving the hospital she has had progressive distention over the last 24 hours. Increasing pain. No nausea, vomiting. No fevers. No cough or shortness of breath. Patient states she's had difficulty urinating and less UOP.  Past Medical History:  Diagnosis Date  . Anticoagulated on Coumadin   . Arthritis   . Diabetes mellitus without complication ([place])   . Diverticulosis   . Dysrhythmia    Atrial fib  . Hx of colonic polyps   . Hyperlipidemia   . Hypertension   . Right renal mass     Patient Active Problem List   Diagnosis Date Noted  . SBO (small bowel obstruction) ([place]) 10/18/2016  . AKI (acute kidney injury) ([place]) 10/18/2016  . Right renal mass     Past Surgical History:  Procedure Laterality Date  . ABDOMINAL HYSTERECTOMY    . IR GENERIC HISTORICAL  07/01/2015   IR RADIOLOGIST EVAL & MGMT 07/01/2015 [facility] INTERV RAD    OB History    No data available       Home Medications    Prior to Admission medications   Medication Sig Start Date End Date Taking? Authorizing Provider  amLODipine (NORVASC) 5 MG tablet Take 10 mg by mouth daily.   Yes [provider]  traMADol (ULTRAM) 50 MG tablet Take by mouth every 8 (eight) hours as needed.   Yes [provider]  atorvastatin (LIPITOR) 20 MG tablet  Take 20 mg by mouth daily.    [provider]  diltiazem (TIAZAC) 120 MG 24 hr capsule Take 120 mg by mouth daily.    [provider]  glipiZIDE (GLUCOTROL XL) 10 MG 24 hr tablet Take 10 mg by mouth daily with breakfast.    [provider]  losartan (COZAAR) 25 MG tablet Take 25 mg by mouth daily.    [provider]  metoprolol (LOPRESSOR) 50 MG tablet Take 50 mg by mouth 2 (two) times daily.    [provider]  omega-3 acid ethyl esters (LOVAZA) 1 G capsule Take by mouth 2 (two) times daily.    [provider]  potassium chloride SA (K-DUR,KLOR-CON) 20 MEQ tablet Take 20 mEq by mouth 2 (two) times daily.    [provider]  warfarin (COUMADIN) 5 MG tablet Take 5 mg by mouth daily.    [provider]    Family History No family history on file.  Social History Social History  Substance Use Topics  . Smoking status: Never Smoker  . Smokeless tobacco: Current User    Types: Snuff  . Alcohol use No     Allergies   Ace inhibitors; Contrast media [iodinated diagnostic agents]; and Nsaids   Review of Systems Review of Systems  Constitutional: Negative for fever.  Respiratory: Negative for cough and  shortness of breath.   Gastrointestinal: Positive for abdominal distention and abdominal pain. Negative for blood in stool, diarrhea and vomiting.  Genitourinary: Positive for decreased urine volume and difficulty urinating. Negative for dysuria.  All other systems reviewed and are negative.    Physical Exam Updated Vital Signs BP (!) 145/76   Pulse (!) 125   Temp 98.4 F (36.9 C) (Oral)   Resp 19   Ht 5\' 2"  (1.575 m)   Wt 74.8 kg (165 lb)   SpO2 (!) 80%   BMI 30.18 kg/m   Physical Exam  Constitutional: She is oriented to person, place, and time. She appears well-developed and well-nourished.  HENT:  Head: Normocephalic and atraumatic.  Right Ear: External ear normal.  Left Ear: External ear normal.    Nose: Nose normal.  Eyes: Right eye exhibits no discharge. Left eye exhibits no discharge.  Cardiovascular: Regular rhythm and normal heart sounds.  Tachycardia present.   Pulmonary/Chest: Effort normal and breath sounds normal.  Abdominal: Soft. She exhibits distension and mass. There is tenderness.  Significant lower abd distention and mass with tenderness in lower abdomen  Neurological: She is alert and oriented to person, place, and time.  Skin: Skin is warm and dry. She is not diaphoretic.  Nursing note and vitals reviewed.    ED Treatments / Results  Labs (all labs ordered are listed, but only abnormal results are displayed) Labs Reviewed  COMPREHENSIVE METABOLIC PANEL - Abnormal; Notable for the following:       Result Value   Sodium 131 (*)    Chloride 94 (*)    Glucose, Bld 187 (*)    BUN 39 (*)    Creatinine, Ser 2.29 (*)    Albumin 3.4 (*)    ALT 13 (*)    GFR calc non Af Amer 18 (*)    GFR calc Af Amer 21 (*)    All other components within normal limits  CBC WITH DIFFERENTIAL/PLATELET - Abnormal; Notable for the following:    RBC 5.22 (*)    MCH 25.7 (*)    Platelets 124 (*)    Lymphs Abs 0.6 (*)    Monocytes Absolute 1.7 (*)    All other components within normal limits  URINALYSIS, ROUTINE W REFLEX MICROSCOPIC - Abnormal; Notable for the following:    APPearance TURBID (*)    Specific Gravity, Urine >1.030 (*)    Bilirubin Urine MODERATE (*)    Ketones, ur 15 (*)    Protein, ur 100 (*)    All other components within normal limits  PROTIME-INR - Abnormal; Notable for the following:    Prothrombin Time 28.3 (*)    All other components within normal limits  URINALYSIS, MICROSCOPIC (REFLEX) - Abnormal; Notable for the following:    Bacteria, UA FEW (*)    Squamous Epithelial / LPF 6-30 (*)    All other components within normal limits  I-STAT CG4 LACTIC ACID, ED - Abnormal; Notable for the following:    Lactic Acid, Venous 3.34 (*)    All other components  within normal limits  URINE CULTURE  CULTURE, BLOOD (ROUTINE X 2)  CULTURE, BLOOD (ROUTINE X 2)    EKG  EKG Interpretation  Date/Time:  Monday October 18 2016 22:33:40 EDT Ventricular Rate:  104 PR Interval:    QRS Duration: 94 QT Interval:  361 QTC Calculation: 471 R Axis:   73 Text Interpretation:  Atrial fibrillation Consider anterior infarct No old tracing to compare Confirmed by Regenia Skeeter,  Yovanna Cogan [phone]) on 10/18/2016 10:50:02 PM       Radiology Ct Abdomen Pelvis Wo Contrast  Result Date: 10/18/2016 CLINICAL DATA:  Worsening abdominal distention and pain for several days. Seen at another facility yesterday. EXAM: CT ABDOMEN AND PELVIS WITHOUT CONTRAST TECHNIQUE: Multidetector CT imaging of the abdomen and pelvis was performed following the standard protocol without IV contrast. COMPARISON:  10/17/2016 FINDINGS: Lower chest: Unchanged small right pleural effusion and minimal atelectatic appearing linear lung base opacities. Hepatobiliary: No focal liver abnormality is seen. No gallstones, gallbladder wall thickening, or biliary dilatation. Pancreas: Unremarkable. No pancreatic ductal dilatation or surrounding inflammatory changes. Spleen: Normal in size without focal abnormality. Adrenals/Urinary Tract: Both adrenals are unremarkable. Stable scarring of the kidneys. No suspicious renal parenchymal lesions. No hydronephrosis. No urinary calculi. Unremarkable ureters. Urinary bladder is decompressed around a Foley catheter. Stomach/Bowel: New marked dilatation of proximal and mid small bowel. Decompressed distal small bowel. Enteric contrast persists in the small bowel from yesterday's CT, although some of the contrast has reached the transverse colon. There is mild mesenteric edema. Colon is unremarkable except for uncomplicated diverticulosis. Vascular/Lymphatic: Unchanged 6-7 cm abdominal aortic aneurysm with endovascular stent graft. No retroperitoneal hemorrhage. Reproductive: Unchanged  16 cm cystic mass in the pelvis, likely of right ovarian origin. Other: Small volume ascites, increased from yesterday. No free intraperitoneal air. Musculoskeletal: No significant skeletal lesion. IMPRESSION: 1. Small bowel obstruction, transition point probably in the low abdomen to the left of midline. 2. Small volume ascites, increased from yesterday. 3. Small right pleural effusion and minimal atelectatic lung base opacities, unchanged. 4. Unchanged abdominal aortic aneurysm with endovascular stent graft. Unchanged 16 cm cystic pelvic mass. Uncomplicated mild colonic diverticulosis. Electronically Signed   By: Andreas Newport M.D.   On: 10/18/2016 23:00   Dg Chest Portable 1 View  Result Date: 10/18/2016 CLINICAL DATA:  Abdominal distension for 8 days EXAM: PORTABLE CHEST 1 VIEW COMPARISON:  CT 01/13/2010 FINDINGS: Cardiomegaly with generous appearance of mediastinum. Aortic atherosclerosis. Small pleural effusions. Streaky bibasilar atelectasis or infiltrates. Diffuse interstitial prominence. No pneumothorax. IMPRESSION: 1. Cardiomegaly with tiny pleural effusions and streaky bibasilar atelectasis or infiltrates 2. Prominent mediastinum, uncertain chronicity and likely augmented by portable technique and rotation. If mediastinal abnormality is suspected, CT chest follow-up could be obtained for further evaluation 3. Mild diffuse interstitial opacity, suspect that this is related to chronic changes. Electronically Signed   By: Donavan Foil M.D.   On: 10/18/2016 23:06    Procedures Procedures (including critical care time)  Medications Ordered in ED Medications  fentaNYL (SUBLIMAZE) injection 50 mcg (50 mcg Intravenous Not Given 10/18/16 2329)  piperacillin-tazobactam (ZOSYN) IVPB 2.25 g (not administered)  0.9 %  sodium chloride infusion (not administered)  sodium chloride 0.9 % bolus 500 mL (0 mLs Intravenous Stopped 10/18/16 2322)  fentaNYL (SUBLIMAZE) injection 50 mcg (50 mcg Intravenous Given  10/18/16 2242)  piperacillin-tazobactam (ZOSYN) IVPB 3.375 g (0 g Intravenous Stopped 10/18/16 2310)     Initial Impression / Assessment and Plan / ED Course  I have reviewed the triage vital signs and the nursing notes.  Pertinent labs & imaging results that were available during my care of the patient were reviewed by me and considered in my medical decision making (see chart for details).     CT shows small bowel obstruction. Likely has reactive ascites. Her initial blood pressure was 75 systolic on arrival. However, when brought back to the treatment room her blood pressure has been over 169 systolic the  entire time without intervention. Unclear why that was low at the beginning but she could just be dehydrated as her creatinine is increased from just 24 hours ago. She was given IV fluids. Due to her hypotension and initial lactic acid, treated with Zosyn for intra-abdominal infection. However now that the CT shows more likely small bowel ejection nothing this can probably be discontinued. A Foley catheter was placed because bedside ultrasound showed a very large cystic fluid collection that seem to be her bladder. However, minimal urine output was obtained and this is more likely to be the cystic structure that she has had for several years seen on multiple CTs. She is feeling much better after NG tube placement. I discussed with hospitalist, Dr. Alcario Drought, who accepts in admission and transfer to [facility] long.  Final Clinical Impressions(s) / ED Diagnoses   Final diagnoses:  Small bowel obstruction ([place])  Acute kidney injury (nontraumatic) ([facility])    New Prescriptions New Prescriptions   No medications on file     Sherwood Gambler, MD 10/19/16 [phone]

## 2016-10-18 NOTE — Progress Notes (Signed)
Pharmacy Antibiotic Note  Chelsea Dean is a 83 y.o. female admitted on 10/18/2016 with C/o abd pain x 8 days/  Pharmacy has been consulted for Zosyn dosing for intra-abdominal infection.  She was seen at [facility] ED yesterday-dx "with mass in her abd".   WBC 9.6, LA 3.34, afebrile SCr = 2.29, estimated CrCl is 16.4 ml/min.   Zosyn 3.375 IV x1  given @ 22:43 in [facility] ED  Plan: Zosyn 2.25 gm IV q6h Monitor renal function and adjust dose accordingly F/u culture results  Height: 5\' 2"  (157.5 cm) Weight: 165 lb (74.8 kg) IBW/kg (Calculated) : 50.1  Temp (24hrs), Avg:98.2 F (36.8 C), Min:98 F (36.7 C), Max:98.4 F (36.9 C)   Recent Labs Lab 10/18/16 2147 10/18/16 2211  WBC 9.6  --   CREATININE 2.29*  --   LATICACIDVEN  --  3.34*    Estimated Creatinine Clearance: 16.4 mL/min (A) (by C-G formula based on SCr of 2.29 mg/dL (H)).    Allergies  Allergen Reactions  . Ace Inhibitors   . Contrast Media [Iodinated Diagnostic Agents]   . Nsaids     Antimicrobials this admission: Zosyn 9/3>>  Dose adjustments this admission: n/a  Microbiology results: 9/3 BCx: sent  9/3 UCx: sent  Thank you for allowing pharmacy to be a part of this patient's care. Nicole Cella, RPh Clinical Pharmacist Pager: [phone] 10/18/2016 10:59 PM    Arman Bogus 10/18/2016 10:57 PM

## 2016-10-18 NOTE — ED Triage Notes (Addendum)
C/o abd pain x 8 days-was seen by PCP treated for UTI-seen at [facility] ED yesterday-dx "with mass in her abd" per daughter-states pt with increase in pain and abd distention-pt is pale-skin w/d-NAD-presents to triage in w/c

## 2016-10-18 NOTE — ED Notes (Signed)
ED Provider at bedside. 

## 2016-10-19 ENCOUNTER — Encounter (HOSPITAL_COMMUNITY): Payer: Self-pay

## 2016-10-19 DIAGNOSIS — I4891 Unspecified atrial fibrillation: Secondary | ICD-10-CM | POA: Diagnosis present

## 2016-10-19 DIAGNOSIS — E119 Type 2 diabetes mellitus without complications: Secondary | ICD-10-CM

## 2016-10-19 DIAGNOSIS — R1900 Intra-abdominal and pelvic swelling, mass and lump, unspecified site: Secondary | ICD-10-CM

## 2016-10-19 DIAGNOSIS — I482 Chronic atrial fibrillation: Secondary | ICD-10-CM

## 2016-10-19 DIAGNOSIS — N179 Acute kidney failure, unspecified: Secondary | ICD-10-CM

## 2016-10-19 HISTORY — DX: Intra-abdominal and pelvic swelling, mass and lump, unspecified site: R19.00

## 2016-10-19 HISTORY — DX: Unspecified atrial fibrillation: I48.91

## 2016-10-19 HISTORY — DX: Type 2 diabetes mellitus without complications: E11.9

## 2016-10-19 LAB — BASIC METABOLIC PANEL
Anion gap: 12 (ref 5–15)
BUN: 41 mg/dL — AB (ref 6–20)
CHLORIDE: 101 mmol/L (ref 101–111)
CO2: 22 mmol/L (ref 22–32)
CREATININE: 1.99 mg/dL — AB (ref 0.44–1.00)
Calcium: 9.4 mg/dL (ref 8.9–10.3)
GFR calc Af Amer: 25 mL/min — ABNORMAL LOW (ref >60)
GFR calc non Af Amer: 21 mL/min — ABNORMAL LOW (ref >60)
GLUCOSE: 139 mg/dL — AB (ref 65–99)
POTASSIUM: 4 mmol/L (ref 3.5–5.1)
Sodium: 135 mmol/L (ref 135–145)

## 2016-10-19 LAB — CBC
HCT: 39.1 % (ref 36.0–46.0)
HEMOGLOBIN: 12.6 g/dL (ref 12.0–15.0)
MCH: 26.1 pg (ref 26.0–34.0)
MCHC: 32.2 g/dL (ref 30.0–36.0)
MCV: 81 fL (ref 78.0–100.0)
Platelets: 106 10*3/uL — ABNORMAL LOW (ref 150–400)
RBC: 4.83 MIL/uL (ref 3.87–5.11)
RDW: 14.5 % (ref 11.5–15.5)
WBC: 8.1 10*3/uL (ref 4.0–10.5)

## 2016-10-19 LAB — GLUCOSE, CAPILLARY
GLUCOSE-CAPILLARY: 144 mg/dL — AB (ref 65–99)
GLUCOSE-CAPILLARY: 167 mg/dL — AB (ref 65–99)
Glucose-Capillary: 124 mg/dL — ABNORMAL HIGH (ref 65–99)
Glucose-Capillary: 110 mg/dL — ABNORMAL HIGH (ref 65–99)
Glucose-Capillary: 122 mg/dL — ABNORMAL HIGH (ref 65–99)

## 2016-10-19 LAB — PROTIME-INR
INR: 2.99
Prothrombin Time: 30.8 seconds — ABNORMAL HIGH (ref 11.4–15.2)

## 2016-10-19 LAB — MAGNESIUM: MAGNESIUM: 1.6 mg/dL — AB (ref 1.7–2.4)

## 2016-10-19 LAB — LACTIC ACID, PLASMA: LACTIC ACID, VENOUS: 1.1 mmol/L (ref 0.5–1.9)

## 2016-10-19 MED ORDER — DEXTROSE-NACL 5-0.9 % IV SOLN
INTRAVENOUS | Status: DC
  Administered 2016-10-19: 13:00:00 via INTRAVENOUS
  Administered 2016-10-19 – 2016-10-21 (×3): 1000 mL via INTRAVENOUS
  Administered 2016-10-21: 15:00:00 via INTRAVENOUS
  Administered 2016-10-22: 1000 mL via INTRAVENOUS
  Administered 2016-10-23 – 2016-10-25 (×5): via INTRAVENOUS

## 2016-10-19 MED ORDER — METOPROLOL TARTRATE 5 MG/5ML IV SOLN
2.5000 mg | Freq: Four times a day (QID) | INTRAVENOUS | Status: DC
  Administered 2016-10-19 – 2016-10-26 (×24): 2.5 mg via INTRAVENOUS
  Filled 2016-10-19 (×23): qty 5

## 2016-10-19 MED ORDER — DIATRIZOATE MEGLUMINE & SODIUM 66-10 % PO SOLN: 90.0000 mL | Freq: Once | ORAL | Status: DC

## 2016-10-19 MED ORDER — INSULIN ASPART 100 UNIT/ML ~~LOC~~ SOLN
0.0000 [IU] | SUBCUTANEOUS | Status: DC
  Administered 2016-10-19 (×2): 1 [IU] via SUBCUTANEOUS
  Administered 2016-10-19: 2 [IU] via SUBCUTANEOUS
  Administered 2016-10-19 – 2016-10-20 (×2): 1 [IU] via SUBCUTANEOUS
  Administered 2016-10-20: 2 [IU] via SUBCUTANEOUS
  Administered 2016-10-20 (×2): 1 [IU] via SUBCUTANEOUS
  Administered 2016-10-20 (×2): 2 [IU] via SUBCUTANEOUS
  Administered 2016-10-21 (×2): 1 [IU] via SUBCUTANEOUS
  Administered 2016-10-21 (×2): 2 [IU] via SUBCUTANEOUS
  Administered 2016-10-21 – 2016-10-22 (×5): 1 [IU] via SUBCUTANEOUS
  Administered 2016-10-22 – 2016-10-23 (×2): 2 [IU] via SUBCUTANEOUS
  Administered 2016-10-23 (×3): 1 [IU] via SUBCUTANEOUS
  Administered 2016-10-23: 2 [IU] via SUBCUTANEOUS
  Administered 2016-10-23: 1 [IU] via SUBCUTANEOUS
  Administered 2016-10-24: 2 [IU] via SUBCUTANEOUS
  Administered 2016-10-24: 1 [IU] via SUBCUTANEOUS
  Administered 2016-10-24 – 2016-10-25 (×4): 2 [IU] via SUBCUTANEOUS
  Administered 2016-10-25: 1 [IU] via SUBCUTANEOUS
  Administered 2016-10-25: 2 [IU] via SUBCUTANEOUS
  Administered 2016-10-25: 1 [IU] via SUBCUTANEOUS
  Administered 2016-10-25 – 2016-10-26 (×3): 2 [IU] via SUBCUTANEOUS
  Administered 2016-10-26: 1 [IU] via SUBCUTANEOUS
  Administered 2016-10-26: 3 [IU] via SUBCUTANEOUS
  Administered 2016-10-26: 1 [IU] via SUBCUTANEOUS
  Administered 2016-10-26 – 2016-10-27 (×3): 2 [IU] via SUBCUTANEOUS
  Administered 2016-10-27: 1 [IU] via SUBCUTANEOUS
  Administered 2016-10-27: 2 [IU] via SUBCUTANEOUS

## 2016-10-19 MED ORDER — ACETAMINOPHEN 325 MG PO TABS
650.0000 mg | ORAL_TABLET | Freq: Four times a day (QID) | ORAL | Status: DC | PRN
  Administered 2016-10-22 – 2016-10-24 (×2): 650 mg via ORAL
  Filled 2016-10-19 (×3): qty 2

## 2016-10-19 MED ORDER — AMLODIPINE BESYLATE 10 MG PO TABS
10.0000 mg | ORAL_TABLET | Freq: Every day | ORAL | Status: DC
  Filled 2016-10-19: qty 1

## 2016-10-19 MED ORDER — ACETAMINOPHEN 650 MG RE SUPP: 650.0000 mg | Freq: Four times a day (QID) | RECTAL | Status: DC | PRN

## 2016-10-19 MED ORDER — ATORVASTATIN CALCIUM 20 MG PO TABS: 20.0000 mg | ORAL_TABLET | Freq: Every day | ORAL | Status: DC

## 2016-10-19 MED ORDER — SODIUM CHLORIDE 0.9 % IV BOLUS (SEPSIS)
500.0000 mL | Freq: Once | INTRAVENOUS | Status: AC
  Administered 2016-10-19: 500 mL via INTRAVENOUS

## 2016-10-19 MED ORDER — ONDANSETRON HCL 4 MG/2ML IJ SOLN
4.0000 mg | Freq: Four times a day (QID) | INTRAMUSCULAR | Status: DC | PRN
Start: 2016-10-19 — End: 2016-10-27
  Administered 2016-10-20: 4 mg via INTRAVENOUS
  Filled 2016-10-19: qty 2

## 2016-10-19 MED ORDER — METOPROLOL TARTRATE 50 MG PO TABS
50.0000 mg | ORAL_TABLET | Freq: Two times a day (BID) | ORAL | Status: DC
  Filled 2016-10-19: qty 1

## 2016-10-19 MED ORDER — ONDANSETRON HCL 4 MG PO TABS
4.0000 mg | ORAL_TABLET | Freq: Four times a day (QID) | ORAL | Status: DC | PRN
  Administered 2016-10-22: 4 mg via ORAL
  Filled 2016-10-19: qty 1

## 2016-10-19 MED ORDER — DILTIAZEM HCL ER COATED BEADS 120 MG PO CP24
120.0000 mg | ORAL_CAPSULE | Freq: Every day | ORAL | Status: DC
  Filled 2016-10-19: qty 1

## 2016-10-19 NOTE — Progress Notes (Signed)
PROGRESS NOTE    Chelsea Dean  TIW:[id number] DOB: [birth year] DOA: 10/18/2016 PCP: [name], MD    Brief Narrative: Chelsea Dean is a 83 y.o. 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  Patient presents to the ED at [facility] with c/o ongoing abd pain and distention.  Symptoms onset about 1 week ago.  Initially thought it was UTI and got Macrobid from PCP.  Went to [facility] 24 hours ago, CT scan and lab work showed large pelvic mass, was referred to Gyn/onc for follow up, although gyn onc notes in their note that mass appears stable and not really changed for several years now (despite its large size).  Returns to [facility] today with worsening abd pain.   ED Course: Repeat CT shows SBO.  Also has ascites that are increased from yesterday.  Otherwise has unchanged AAA EVAR and pelvic mass.   Assessment & Plan:   Principal Problem:   SBO (small bowel obstruction) (HCC) Active Problems:   AKI (acute kidney injury) ([place])   A-fib ([place])   DM2 (diabetes mellitus, type 2) (HCC)   Pelvic mass   1-SBO;  Keep NG tube.  IV fluids.  Surgery consulted.  On IV zosyn.   2-AKI;  Pre- renal , hypovolemia.  Will check renal US.  Bladder scan, will need foley.  Increase IV fluids.  Strict I and O.  Hold Cozaar.   3-Unchanged 16 cm cystic pelvic mass. Unclear if this is causing SBO, Surgery consulted.   4-Lactic acidosis; trending down. Continue with IV fluids.  On IV zosyn. Follow blood culture, urine culture.   5-A fib on coumadin; hold coumadin in case patient required Sx.  Follow INR>  Will order IV metoprolol. Hold oral Cardizem and Toprol.   6-DM; hold oral hypoglycemic agent.   7-HTN; SBP soft. Hold Cardizem and Norvasc.   AAA s/p EVAR    DVT prophylaxis: INR at 2. SCD<  Code Status: Full code. Family will discussed with patient Code status.    Family Communication: son and daughter who were at bedside.  Disposition Plan: to be determine.   Consultants:   Surgery    Procedures:   Renal US>    Antimicrobials: zosyn    Subjective: Per daughter patient had her ovary removed.  Patient started to have abdominal pain since one week ago. She was treated for UTI by PCP. She started to have nausea and vomiting last few days.  She has not had a BM since more than 5 days ago.   Objective: Vitals:   10/19/16 0013 10/19/16 0213 10/19/16 0655 10/19/16 1003  BP: 106/76 104/81 103/61 108/65  Pulse: 98 88 99 (!) 104  Resp: (!) 25 16 16    Temp: 98.3 F (36.8 C) 98.1 F (36.7 C) 99.5 F (37.5 C)   TempSrc: Oral Oral Oral   SpO2: 90% 98% 97%   Weight:  74.8 kg (165 lb)    Height:  5\' 2"  (1.575 m)      Intake/Output Summary (Last 24 hours) at 10/19/16 1132 Last data filed at 10/19/16 1027  Gross per 24 hour  Intake             1950 ml  Output             1220 ml  Net              730  ml   Filed Weights   10/18/16 2121 10/19/16 0213  Weight: 74.8 kg (165 lb) 74.8 kg (165 lb)    Examination:  General exam: sleepy, but wake up  And answer questions. NG tube in place.  Respiratory system: Clear to auscultation. Respiratory effort normal. Cardiovascular system: S1 & S2 heard, RRR. No JVD, murmurs, rubs, gallops or clicks. No pedal edema. Gastrointestinal system: Abdomen is very distended, soft and mild tender. No organomegaly or masses felt. Decrease bowel sound.  Central nervous system: Alert and oriented. No focal neurological deficits. Extremities: Symmetric 5 x 5 power. Skin: No rashes, lesions or ulcers    Data Reviewed: I have personally reviewed following labs and imaging studies  CBC:  Recent Labs Lab 10/18/16 2147 10/19/16 0357  WBC 9.6 8.1  NEUTROABS 7.2  --   HGB 13.4 12.6  HCT 42.7 39.1  MCV 81.8 81.0  PLT 124* 270*   Basic Metabolic Panel:  Recent Labs Lab 10/18/16 2147 10/19/16 0357   NA 131* 135  K 4.2 4.0  CL 94* 101  CO2 25 22  GLUCOSE 187* 139*  BUN 39* 41*  CREATININE 2.29* 1.99*  CALCIUM 9.9 9.4   GFR: Estimated Creatinine Clearance: 18.9 mL/min (A) (by C-G formula based on SCr of 1.99 mg/dL (H)). Liver Function Tests:  Recent Labs Lab 10/18/16 2147  AST 27  ALT 13*  ALKPHOS 62  BILITOT 1.1  PROT 6.9  ALBUMIN 3.4*   No results for input(s): LIPASE, AMYLASE in the last 168 hours. No results for input(s): AMMONIA in the last 168 hours. Coagulation Profile:  Recent Labs Lab 10/18/16 2147 10/19/16 0357  INR 2.68 2.99   Cardiac Enzymes: No results for input(s): CKTOTAL, CKMB, CKMBINDEX, TROPONINI in the last 168 hours. BNP (last 3 results) No results for input(s): PROBNP in the last 8760 hours. HbA1C: No results for input(s): HGBA1C in the last 72 hours. CBG:  Recent Labs Lab 10/19/16 0743  GLUCAP 124*   Lipid Profile: No results for input(s): CHOL, HDL, LDLCALC, TRIG, CHOLHDL, LDLDIRECT in the last 72 hours. Thyroid Function Tests: No results for input(s): TSH, T4TOTAL, FREET4, T3FREE, THYROIDAB in the last 72 hours. Anemia Panel: No results for input(s): VITAMINB12, FOLATE, FERRITIN, TIBC, IRON, RETICCTPCT in the last 72 hours. Sepsis Labs:  Recent Labs Lab 10/18/16 2211 10/19/16 0357  LATICACIDVEN 3.34* 1.1    No results found for this or any previous visit (from the past 240 hour(s)).       Radiology Studies: Ct Abdomen Pelvis Wo Contrast  Result Date: 10/18/2016 CLINICAL DATA:  Worsening abdominal distention and pain for several days. Seen at another facility yesterday. EXAM: CT ABDOMEN AND PELVIS WITHOUT CONTRAST TECHNIQUE: Multidetector CT imaging of the abdomen and pelvis was performed following the standard protocol without IV contrast. COMPARISON:  10/17/2016 FINDINGS: Lower chest: Unchanged small right pleural effusion and minimal atelectatic appearing linear lung base opacities. Hepatobiliary: No focal liver  abnormality is seen. No gallstones, gallbladder wall thickening, or biliary dilatation. Pancreas: Unremarkable. No pancreatic ductal dilatation or surrounding inflammatory changes. Spleen: Normal in size without focal abnormality. Adrenals/Urinary Tract: Both adrenals are unremarkable. Stable scarring of the kidneys. No suspicious renal parenchymal lesions. No hydronephrosis. No urinary calculi. Unremarkable ureters. Urinary bladder is decompressed around a Foley catheter. Stomach/Bowel: New marked dilatation of proximal and mid small bowel. Decompressed distal small bowel. Enteric contrast persists in the small bowel from yesterday's CT, although some of the contrast has reached the transverse colon. There is mild  mesenteric edema. Colon is unremarkable except for uncomplicated diverticulosis. Vascular/Lymphatic: Unchanged 6-7 cm abdominal aortic aneurysm with endovascular stent graft. No retroperitoneal hemorrhage. Reproductive: Unchanged 16 cm cystic mass in the pelvis, likely of right ovarian origin. Other: Small volume ascites, increased from yesterday. No free intraperitoneal air. Musculoskeletal: No significant skeletal lesion. IMPRESSION: 1. Small bowel obstruction, transition point probably in the low abdomen to the left of midline. 2. Small volume ascites, increased from yesterday. 3. Small right pleural effusion and minimal atelectatic lung base opacities, unchanged. 4. Unchanged abdominal aortic aneurysm with endovascular stent graft. Unchanged 16 cm cystic pelvic mass. Uncomplicated mild colonic diverticulosis. Electronically Signed   By: Andreas Newport M.D.   On: 10/18/2016 23:00   Dg Abdomen 1 View  Result Date: 10/19/2016 CLINICAL DATA:  Nasogastric tube placement EXAM: ABDOMEN - 1 VIEW COMPARISON:  CT 10/18/2016 FINDINGS: The nasogastric tube extends into the stomach, tip may be in the proximal duodenum. Persistent dilated stacked small bowel loops consistent with obstruction. No extraluminal  air is evident. IMPRESSION: 1. NG tube extends well into the stomach, possibly into the proximal duodenum. 2. Obstructive gas pattern with dilated stacked small bowel loops. Electronically Signed   By: Andreas Newport M.D.   On: 10/19/2016 00:15   Dg Chest Portable 1 View  Result Date: 10/18/2016 CLINICAL DATA:  Abdominal distension for 8 days EXAM: PORTABLE CHEST 1 VIEW COMPARISON:  CT 01/13/2010 FINDINGS: Cardiomegaly with generous appearance of mediastinum. Aortic atherosclerosis. Small pleural effusions. Streaky bibasilar atelectasis or infiltrates. Diffuse interstitial prominence. No pneumothorax. IMPRESSION: 1. Cardiomegaly with tiny pleural effusions and streaky bibasilar atelectasis or infiltrates 2. Prominent mediastinum, uncertain chronicity and likely augmented by portable technique and rotation. If mediastinal abnormality is suspected, CT chest follow-up could be obtained for further evaluation 3. Mild diffuse interstitial opacity, suspect that this is related to chronic changes. Electronically Signed   By: Donavan Foil M.D.   On: 10/18/2016 23:06        Scheduled Meds: . fentaNYL (SUBLIMAZE) injection  50 mcg Intravenous Once  . insulin aspart  0-9 Units Subcutaneous Q4H   Continuous Infusions: . [COMPLETED] sodium chloride 125 mL/hr at 10/19/16 0810  . dextrose 5 % and 0.9% NaCl    . piperacillin-tazobactam (ZOSYN)  IV 2.25 g (10/19/16 0710)  . sodium chloride       LOS: 1 day    Time spent: 35 minutes,     Courvoisier Hamblen, Cassie Freer, MD [facility] Pager [phone]  If 7PM-7AM, please contact night-coverage [email or web address] Password [email or web address] 10/19/2016, 11:32 AM

## 2016-10-19 NOTE — Consult Note (Signed)
[facility] Consult Note  Chelsea Dean 1927/05/08  [id number].    Requesting MD: Frederic Jericho, MD Chief Complaint/Reason for Consult: SBO  HPI:  Chelsea Dean is an 83 y/o female with PMH HTN, HLD, AAA s/p EVAR 2017, Right renal cell cancer s/p cryoablation 2016, DM2, cystic pelvic mass, and a.fib on coumadin who presents with abdominal pain and distention for one week. Her daughter and son are at bedside reporting a majority of patient history, as she is very somnolent. Patient initially attributed pain/sxs to a UTI but when antibiotic treatment for UTI did not improve her symptoms, she went to [facility] for further evaluation and she was discharged and referred to [facility] to follow up with Gynecology to further evaluate pelvic mass. Due to persistent/pain distention she returned to [facility] for re-evaluation. The patient is unable to describe her pain but she denies similar abdominal pain the the past. She endorses one episode of vomiting. No known history of bowel obstruction. Last BM was one week ago. At baseline patient has 3-4, hard, non-bloody BMs weekly and intermittently takes Senakot. Denies a personal history of colon cancer, but her sister diet of colon cancer. Denies abnormal colonoscopy. Has a history of abdominal hysterectomy. According to patients daughter, the patient is allergic to IV contrast which causes rash and trouble breathing.  Workup: a repeat CT scan showed a new small bowel obstruction, transition point probably in the low abdomen to the left of midline. Repeat labs showed no leukocytosis, UA negative for acute UTI, LFT's normal, AKI (SCr 2.29), Lactate 3.34 >> 1.1 s/p IVF rehydration, and no signs of intestinal or renal mass. She was admitted for treatment of SBO, NG tube placed.   ROS: Review of Systems  Constitutional: Negative for chills and fever.  Gastrointestinal: Positive for abdominal pain, constipation, nausea and vomiting.  All other systems reviewed  and are negative.   Family History  Problem Relation Age of Onset  . Cancer Sister        Rectal and Stomach    Past Medical History:  Diagnosis Date  . Anticoagulated on Coumadin   . Arthritis   . Diabetes mellitus without complication ([place])   . Diverticulosis   . Dysrhythmia    Atrial fib  . Hx of colonic polyps   . Hyperlipidemia   . Hypertension   . Right renal mass     Past Surgical History:  Procedure Laterality Date  . ABDOMINAL AORTIC ANEURYSM REPAIR    . ABDOMINAL HYSTERECTOMY    . IR GENERIC HISTORICAL  07/01/2015   IR RADIOLOGIST EVAL & MGMT 07/01/2015 [facility] INTERV RAD    Social History:  reports that she has never smoked. Her smokeless tobacco use includes Snuff. She reports that she does not drink alcohol. Her drug history is not on file.  Allergies:  Allergies  Allergen Reactions  . Ace Inhibitors   . Contrast Media [Iodinated Diagnostic Agents]   . Nsaids     Medications Prior to Admission  Medication Sig Dispense Refill  . acetaminophen (TYLENOL) 650 MG CR tablet Take 650 mg by mouth daily as needed for pain.    Marland Kitchen amLODipine (NORVASC) 10 MG tablet Take 10 mg by mouth daily.    Marland Kitchen atorvastatin (LIPITOR) 20 MG tablet Take 20 mg by mouth daily.    . diclofenac sodium (VOLTAREN) 1 % GEL Apply 2 g topically daily as needed (KNEE PAIN).    Marland Kitchen diltiazem (CARTIA XT) 120 MG 24 hr capsule Take 120 mg  by mouth daily.    Marland Kitchen glipiZIDE (GLUCOTROL XL) 10 MG 24 hr tablet Take 10 mg by mouth daily with breakfast.    . losartan (COZAAR) 100 MG tablet Take 100 mg by mouth daily.  0  . metoprolol tartrate (LOPRESSOR) 100 MG tablet Take 100 mg by mouth 2 (two) times daily.     . potassium chloride SA (K-DUR,KLOR-CON) 20 MEQ tablet Take 20 mEq by mouth 2 (two) times daily.    . traMADol (ULTRAM) 50 MG tablet Take 50 mg by mouth every 8 (eight) hours as needed for moderate pain.     Marland Kitchen warfarin (COUMADIN) 5 MG tablet Take 5 mg by mouth daily.      Blood pressure 128/70,  pulse 91, temperature 98.3 F (36.8 C), temperature source Oral, resp. rate 17, height 5' 2"  (1.575 m), weight 74.8 kg (165 lb), SpO2 92 %. Physical Exam: Physical Exam  Constitutional: She is oriented to person, place, and time. She appears well-developed. No distress.  HENT:  Head: Normocephalic and atraumatic.  Right Ear: External ear normal.  Left Ear: External ear normal.  Eyes: EOM are normal. Right eye exhibits no discharge. Left eye exhibits no discharge. No scleral icterus.  Neck: Normal range of motion. Neck supple. No tracheal deviation present. No thyromegaly present.  Cardiovascular: Regular rhythm, normal heart sounds and intact distal pulses.   No murmur heard. Pulmonary/Chest: Effort normal and breath sounds normal. No respiratory distress. She has no wheezes. She has no rales.  Abdominal: Soft. Bowel sounds are normal. She exhibits distension. There is tenderness (Right mid-abdomen). There is no rebound and no guarding. No hernia.  Musculoskeletal: She exhibits no edema, tenderness or deformity.  Neurological: She is alert and oriented to person, place, and time. No sensory deficit.  Skin: Skin is warm and dry. She is not diaphoretic.  Psychiatric: She has a normal mood and affect. Her behavior is normal.    Results for orders placed or performed during the hospital encounter of 10/18/16 (from the past 48 hour(s))  Comprehensive metabolic panel     Status: Abnormal   Collection Time: 10/18/16  9:47 PM  Result Value Ref Range   Sodium 131 (L) 135 - 145 mmol/L   Potassium 4.2 3.5 - 5.1 mmol/L   Chloride 94 (L) 101 - 111 mmol/L   CO2 25 22 - 32 mmol/L   Glucose, Bld 187 (H) 65 - 99 mg/dL   BUN 39 (H) 6 - 20 mg/dL   Creatinine, Ser 2.29 (H) 0.44 - 1.00 mg/dL   Calcium 9.9 8.9 - 10.3 mg/dL   Total Protein 6.9 6.5 - 8.1 g/dL   Albumin 3.4 (L) 3.5 - 5.0 g/dL   AST 27 15 - 41 U/L   ALT 13 (L) 14 - 54 U/L   Alkaline Phosphatase 62 38 - 126 U/L   Total Bilirubin 1.1 0.3 -  1.2 mg/dL   GFR calc non Af Amer 18 (L) >60 mL/min   GFR calc Af Amer 21 (L) >60 mL/min    Comment: (NOTE) The eGFR has been calculated using the CKD EPI equation. This calculation has not been validated in all clinical situations. eGFR's persistently <60 mL/min signify possible Chronic Kidney Disease.    Anion gap 12 5 - 15  CBC with Differential     Status: Abnormal   Collection Time: 10/18/16  9:47 PM  Result Value Ref Range   WBC 9.6 4.0 - 10.5 K/uL   RBC 5.22 (H) 3.87 - 5.11  MIL/uL   Hemoglobin 13.4 12.0 - 15.0 g/dL   HCT 42.7 36.0 - 46.0 %   MCV 81.8 78.0 - 100.0 fL   MCH 25.7 (L) 26.0 - 34.0 pg   MCHC 31.4 30.0 - 36.0 g/dL   RDW 14.6 11.5 - 15.5 %   Platelets 124 (L) 150 - 400 K/uL   Neutrophils Relative % 75 %   Neutro Abs 7.2 1.7 - 7.7 K/uL   Lymphocytes Relative 7 %   Lymphs Abs 0.6 (L) 0.7 - 4.0 K/uL   Monocytes Relative 18 %   Monocytes Absolute 1.7 (H) 0.1 - 1.0 K/uL   Eosinophils Relative 0 %   Eosinophils Absolute 0.0 0.0 - 0.7 K/uL   Basophils Relative 0 %   Basophils Absolute 0.0 0.0 - 0.1 K/uL  Urinalysis, Routine w reflex microscopic     Status: Abnormal   Collection Time: 10/18/16  9:47 PM  Result Value Ref Range   Color, Urine YELLOW YELLOW   APPearance TURBID (A) CLEAR   Specific Gravity, Urine >1.030 (H) 1.005 - 1.030   pH 5.5 5.0 - 8.0   Glucose, UA NEGATIVE NEGATIVE mg/dL   Hgb urine dipstick NEGATIVE NEGATIVE   Bilirubin Urine MODERATE (A) NEGATIVE   Ketones, ur 15 (A) NEGATIVE mg/dL   Protein, ur 100 (A) NEGATIVE mg/dL   Nitrite NEGATIVE NEGATIVE   Leukocytes, UA NEGATIVE NEGATIVE  Protime-INR     Status: Abnormal   Collection Time: 10/18/16  9:47 PM  Result Value Ref Range   Prothrombin Time 28.3 (H) 11.4 - 15.2 seconds   INR 2.68   Urinalysis, Microscopic (reflex)     Status: Abnormal   Collection Time: 10/18/16  9:47 PM  Result Value Ref Range   RBC / HPF NONE SEEN 0 - 5 RBC/hpf   WBC, UA 0-5 0 - 5 WBC/hpf   Bacteria, UA FEW (A)  NONE SEEN   Squamous Epithelial / LPF 6-30 (A) NONE SEEN   Amorphous Crystal PRESENT   I-Stat CG4 Lactic Acid, ED     Status: Abnormal   Collection Time: 10/18/16 10:11 PM  Result Value Ref Range   Lactic Acid, Venous 3.34 (HH) 0.5 - 1.9 mmol/L   Comment NOTIFIED PHYSICIAN   Lactic acid, plasma     Status: None   Collection Time: 10/19/16  3:57 AM  Result Value Ref Range   Lactic Acid, Venous 1.1 0.5 - 1.9 mmol/L  CBC     Status: Abnormal   Collection Time: 10/19/16  3:57 AM  Result Value Ref Range   WBC 8.1 4.0 - 10.5 K/uL   RBC 4.83 3.87 - 5.11 MIL/uL   Hemoglobin 12.6 12.0 - 15.0 g/dL   HCT 39.1 36.0 - 46.0 %   MCV 81.0 78.0 - 100.0 fL   MCH 26.1 26.0 - 34.0 pg   MCHC 32.2 30.0 - 36.0 g/dL   RDW 14.5 11.5 - 15.5 %   Platelets 106 (L) 150 - 400 K/uL    Comment: SPECIMEN CHECKED FOR CLOTS REPEATED TO VERIFY PLATELET COUNT CONFIRMED BY SMEAR   Basic metabolic panel     Status: Abnormal   Collection Time: 10/19/16  3:57 AM  Result Value Ref Range   Sodium 135 135 - 145 mmol/L   Potassium 4.0 3.5 - 5.1 mmol/L   Chloride 101 101 - 111 mmol/L   CO2 22 22 - 32 mmol/L   Glucose, Bld 139 (H) 65 - 99 mg/dL   BUN 41 (H) 6 -  20 mg/dL   Creatinine, Ser 1.99 (H) 0.44 - 1.00 mg/dL   Calcium 9.4 8.9 - 10.3 mg/dL   GFR calc non Af Amer 21 (L) >60 mL/min   GFR calc Af Amer 25 (L) >60 mL/min    Comment: (NOTE) The eGFR has been calculated using the CKD EPI equation. This calculation has not been validated in all clinical situations. eGFR's persistently <60 mL/min signify possible Chronic Kidney Disease.    Anion gap 12 5 - 15  Protime-INR     Status: Abnormal   Collection Time: 10/19/16  3:57 AM  Result Value Ref Range   Prothrombin Time 30.8 (H) 11.4 - 15.2 seconds   INR 2.99   Glucose, capillary     Status: Abnormal   Collection Time: 10/19/16  7:43 AM  Result Value Ref Range   Glucose-Capillary 124 (H) 65 - 99 mg/dL  Glucose, capillary     Status: Abnormal   Collection  Time: 10/19/16 11:37 AM  Result Value Ref Range   Glucose-Capillary 110 (H) 65 - 99 mg/dL  Magnesium     Status: Abnormal   Collection Time: 10/19/16  2:05 PM  Result Value Ref Range   Magnesium 1.6 (L) 1.7 - 2.4 mg/dL   Ct Abdomen Pelvis Wo Contrast  Result Date: 10/18/2016 CLINICAL DATA:  Worsening abdominal distention and pain for several days. Seen at another facility yesterday. EXAM: CT ABDOMEN AND PELVIS WITHOUT CONTRAST TECHNIQUE: Multidetector CT imaging of the abdomen and pelvis was performed following the standard protocol without IV contrast. COMPARISON:  10/17/2016 FINDINGS: Lower chest: Unchanged small right pleural effusion and minimal atelectatic appearing linear lung base opacities. Hepatobiliary: No focal liver abnormality is seen. No gallstones, gallbladder wall thickening, or biliary dilatation. Pancreas: Unremarkable. No pancreatic ductal dilatation or surrounding inflammatory changes. Spleen: Normal in size without focal abnormality. Adrenals/Urinary Tract: Both adrenals are unremarkable. Stable scarring of the kidneys. No suspicious renal parenchymal lesions. No hydronephrosis. No urinary calculi. Unremarkable ureters. Urinary bladder is decompressed around a Foley catheter. Stomach/Bowel: New marked dilatation of proximal and mid small bowel. Decompressed distal small bowel. Enteric contrast persists in the small bowel from yesterday's CT, although some of the contrast has reached the transverse colon. There is mild mesenteric edema. Colon is unremarkable except for uncomplicated diverticulosis. Vascular/Lymphatic: Unchanged 6-7 cm abdominal aortic aneurysm with endovascular stent graft. No retroperitoneal hemorrhage. Reproductive: Unchanged 16 cm cystic mass in the pelvis, likely of right ovarian origin. Other: Small volume ascites, increased from yesterday. No free intraperitoneal air. Musculoskeletal: No significant skeletal lesion. IMPRESSION: 1. Small bowel obstruction,  transition point probably in the low abdomen to the left of midline. 2. Small volume ascites, increased from yesterday. 3. Small right pleural effusion and minimal atelectatic lung base opacities, unchanged. 4. Unchanged abdominal aortic aneurysm with endovascular stent graft. Unchanged 16 cm cystic pelvic mass. Uncomplicated mild colonic diverticulosis. Electronically Signed   By: Andreas Newport M.D.   On: 10/18/2016 23:00   Dg Abdomen 1 View  Result Date: 10/19/2016 CLINICAL DATA:  Nasogastric tube placement EXAM: ABDOMEN - 1 VIEW COMPARISON:  CT 10/18/2016 FINDINGS: The nasogastric tube extends into the stomach, tip may be in the proximal duodenum. Persistent dilated stacked small bowel loops consistent with obstruction. No extraluminal air is evident. IMPRESSION: 1. NG tube extends well into the stomach, possibly into the proximal duodenum. 2. Obstructive gas pattern with dilated stacked small bowel loops. Electronically Signed   By: Andreas Newport M.D.   On: 10/19/2016 00:15  Dg Chest Portable 1 View  Result Date: 10/18/2016 CLINICAL DATA:  Abdominal distension for 8 days EXAM: PORTABLE CHEST 1 VIEW COMPARISON:  CT 01/13/2010 FINDINGS: Cardiomegaly with generous appearance of mediastinum. Aortic atherosclerosis. Small pleural effusions. Streaky bibasilar atelectasis or infiltrates. Diffuse interstitial prominence. No pneumothorax. IMPRESSION: 1. Cardiomegaly with tiny pleural effusions and streaky bibasilar atelectasis or infiltrates 2. Prominent mediastinum, uncertain chronicity and likely augmented by portable technique and rotation. If mediastinal abnormality is suspected, CT chest follow-up could be obtained for further evaluation 3. Mild diffuse interstitial opacity, suspect that this is related to chronic changes. Electronically Signed   By: Donavan Foil M.D.   On: 10/18/2016 23:06      Assessment/Plan SBO  - suspect 2/2 to intra-abdominal adhesions, but possibly pelvic mass - will  discuss use of gastrografin PO contrast in the setting of IV contrast allergy with pharmacist/MD prior to ordering small bowel protocol with gastrografin.  - continue NPO and NGT to LIWS.  - repeat AXR in AM - would likely benefit from a bowel regimen once tolerating PO in the setting of baseline constipation.   Hx Renal cell CA - s/p cryoablation small right renal cell cancer at [place] [facility] end of 2016 AKI  DM2  Pelvic mass - cystic, followed by Gyn/Onc, per notes stable/minimally enlarged compared to 2015 AAA s/p EVAR 2017 Afib on coumadin    Jill Alexanders, [facility] 10/19/2016, 3:27 PM Pager: [phone] Consults: [phone] Mon-Fri 7:00 am-4:30 pm Sat-Sun 7:00 am-11:30 am

## 2016-10-19 NOTE — Progress Notes (Signed)
ANTICOAGULATION CONSULT NOTE - Initial Consult  Pharmacy Consult for warfarin Indication: atrial fibrillation  Allergies  Allergen Reactions  . Ace Inhibitors   . Contrast Media [Iodinated Diagnostic Agents]   . Nsaids    Patient Measurements: Height: 5\' 2"  (157.5 cm) Weight: 165 lb (74.8 kg) IBW/kg (Calculated) : 50.1  Vital Signs: Temp: 99.5 F (37.5 C) (09/04 0655) Temp Source: Oral (09/04 0655) BP: 108/65 (09/04 1003) Pulse Rate: 104 (09/04 1003)  Labs:  Recent Labs  10/18/16 2147 10/19/16 0357  HGB 13.4 12.6  HCT 42.7 39.1  PLT 124* 106*  LABPROT 28.3* 30.8*  INR 2.68 2.99  CREATININE 2.29* 1.99*   Estimated Creatinine Clearance: 18.9 mL/min (A) (by C-G formula based on SCr of 1.99 mg/dL (H)).  Medical History: Past Medical History:  Diagnosis Date  . Anticoagulated on Coumadin   . Arthritis   . Diabetes mellitus without complication ([place])   . Diverticulosis   . Dysrhythmia    Atrial fib  . Hx of colonic polyps   . Hyperlipidemia   . Hypertension   . Right renal mass    Medications:  Scheduled:  . fentaNYL (SUBLIMAZE) injection  50 mcg Intravenous Once  . insulin aspart  0-9 Units Subcutaneous Q4H   Infusions:  . [COMPLETED] sodium chloride 125 mL/hr at 10/19/16 0810  . piperacillin-tazobactam (ZOSYN)  IV 2.25 g (10/19/16 0710)    Assessment: 6 yoF with c/o abdominal pain x 8 days.  PTA Warfarin for Afib, home dose 5mg  daily, last dose 9/3 INR this am 2.99, increased from 2.68 on admit 9/3 pm  Goal of Therapy:  INR 2-3 Monitor platelets by anticoagulation protocol: Yes   Plan:  Warfarin discontinued  Nyoka Cowden, Grahm Etsitty L 10/19/2016,10:56 AM

## 2016-10-19 NOTE — H&P (Signed)
History and Physical    Chelsea Dean GHW:[id number] DOB: April 08, 1927 DOA: 10/18/2016  PCP: [name], MD  Patient coming from: Home  I have personally briefly reviewed patient's old medical records in [place]  Chief Complaint: Abd pain  HPI: Chelsea Dean is a 83 y.o. 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  Patient presents to the ED at [facility] with c/o ongoing abd pain and distention.  Symptoms onset about 1 week ago.  Initially thought it was UTI and got Macrobid from PCP.  Went to [facility] 24 hours ago, CT scan and lab work showed large pelvic mass, was referred to Gyn/onc for follow up, although gyn onc notes in their note that mass appears stable and not really changed for several years now (despite its large size).  Returns to [facility] today with worsening abd pain.   ED Course: Repeat CT shows SBO.  Also has ascites that are increased from yesterday.  Otherwise has unchanged AAA EVAR and pelvic mass.   Review of Systems: As per HPI otherwise 10 point review of systems negative.   Past Medical History:  Diagnosis Date  . Anticoagulated on Coumadin   . Arthritis   . Diabetes mellitus without complication ([place])   . Diverticulosis   . Dysrhythmia    Atrial fib  . Hx of colonic polyps   . Hyperlipidemia   . Hypertension   . Right renal mass     Past Surgical History:  Procedure Laterality Date  . ABDOMINAL AORTIC ANEURYSM REPAIR    . ABDOMINAL HYSTERECTOMY    . IR GENERIC HISTORICAL  07/01/2015   IR RADIOLOGIST EVAL & MGMT 07/01/2015 [facility] INTERV RAD     reports that she has never smoked. Her smokeless tobacco use includes Snuff. She reports that she does not drink alcohol. Her drug history is not on file.  Allergies  Allergen Reactions  . Ace Inhibitors   . Contrast Media [Iodinated Diagnostic Agents]   . Nsaids     Family  History  Problem Relation Age of Onset  . Cancer Sister        Rectal and Stomach     Prior to Admission medications   Medication Sig Start Date End Date Taking? Authorizing Provider  amLODipine (NORVASC) 5 MG tablet Take 10 mg by mouth daily.   Yes [provider]  traMADol (ULTRAM) 50 MG tablet Take by mouth every 8 (eight) hours as needed.   Yes [provider]  atorvastatin (LIPITOR) 20 MG tablet Take 20 mg by mouth daily.    [provider]  diltiazem (TIAZAC) 120 MG 24 hr capsule Take 120 mg by mouth daily.    [provider]  glipiZIDE (GLUCOTROL XL) 10 MG 24 hr tablet Take 10 mg by mouth daily with breakfast.    [provider]  losartan (COZAAR) 25 MG tablet Take 25 mg by mouth daily.    [provider]  metoprolol (LOPRESSOR) 50 MG tablet Take 50 mg by mouth 2 (two) times daily.    [provider]  omega-3 acid ethyl esters (LOVAZA) 1 G capsule Take by mouth 2 (two) times daily.    [provider]  potassium chloride SA (K-DUR,KLOR-CON) 20 MEQ tablet Take 20 mEq by mouth 2 (two) times daily.    [provider]  warfarin (COUMADIN) 5 MG tablet Take 5 mg by  mouth daily.    [provider]    Physical Exam: Vitals:   10/18/16 2145 10/18/16 2200 10/19/16 0013 10/19/16 0213  BP: 105/62 (!) 145/76 106/76 104/81  Pulse: 99 (!) 125 98 88  Resp: (!) 29 19 (!) 25 16  Temp:  98.4 F (36.9 C) 98.3 F (36.8 C) 98.1 F (36.7 C)  TempSrc:  Oral Oral Oral  SpO2: 98% (!) 80% 90% 98%  Weight:    74.8 kg (165 lb)  Height:    5\' 2"  (1.575 m)    Constitutional: NAD, calm, comfortable Eyes: PERRL, lids and conjunctivae normal ENMT: Mucous membranes are moist. Posterior pharynx clear of any exudate or lesions.Normal dentition.  Neck: normal, supple, no masses, no thyromegaly Respiratory: clear to auscultation bilaterally, no wheezing, no crackles. Normal respiratory effort. No accessory muscle use.    Cardiovascular: Regular rate and rhythm, no murmurs / rubs / gallops. No extremity edema. 2+ pedal pulses. No carotid bruits.  Abdomen: no tenderness, no masses palpated. No hepatosplenomegaly. Bowel sounds positive.  Musculoskeletal: no clubbing / cyanosis. No joint deformity upper and lower extremities. Good ROM, no contractures. Normal muscle tone.  Skin: no rashes, lesions, ulcers. No induration Neurologic: CN 2-12 grossly intact. Sensation intact, DTR normal. Strength 5/5 in all 4.  Psychiatric: Normal judgment and insight. Alert and oriented x 3. Normal mood.    Labs on Admission: I have personally reviewed following labs and imaging studies  CBC:  Recent Labs Lab 10/18/16 2147  WBC 9.6  NEUTROABS 7.2  HGB 13.4  HCT 42.7  MCV 81.8  PLT 761*   Basic Metabolic Panel:  Recent Labs Lab 10/18/16 2147  NA 131*  K 4.2  CL 94*  CO2 25  GLUCOSE 187*  BUN 39*  CREATININE 2.29*  CALCIUM 9.9   GFR: Estimated Creatinine Clearance: 16.4 mL/min (A) (by C-G formula based on SCr of 2.29 mg/dL (H)). Liver Function Tests:  Recent Labs Lab 10/18/16 2147  AST 27  ALT 13*  ALKPHOS 62  BILITOT 1.1  PROT 6.9  ALBUMIN 3.4*   No results for input(s): LIPASE, AMYLASE in the last 168 hours. No results for input(s): AMMONIA in the last 168 hours. Coagulation Profile:  Recent Labs Lab 10/18/16 2147  INR 2.68   Cardiac Enzymes: No results for input(s): CKTOTAL, CKMB, CKMBINDEX, TROPONINI in the last 168 hours. BNP (last 3 results) No results for input(s): PROBNP in the last 8760 hours. HbA1C: No results for input(s): HGBA1C in the last 72 hours. CBG: No results for input(s): GLUCAP in the last 168 hours. Lipid Profile: No results for input(s): CHOL, HDL, LDLCALC, TRIG, CHOLHDL, LDLDIRECT in the last 72 hours. Thyroid Function Tests: No results for input(s): TSH, T4TOTAL, FREET4, T3FREE, THYROIDAB in the last 72 hours. Anemia Panel: No results for input(s): VITAMINB12,  FOLATE, FERRITIN, TIBC, IRON, RETICCTPCT in the last 72 hours. Urine analysis:    Component Value Date/Time   COLORURINE YELLOW 10/18/2016 2147   APPEARANCEUR TURBID (A) 10/18/2016 2147   LABSPEC >1.030 (H) 10/18/2016 2147   PHURINE 5.5 10/18/2016 2147   GLUCOSEU NEGATIVE 10/18/2016 2147   HGBUR NEGATIVE 10/18/2016 2147   BILIRUBINUR MODERATE (A) 10/18/2016 2147   KETONESUR 15 (A) 10/18/2016 2147   PROTEINUR 100 (A) 10/18/2016 2147   NITRITE NEGATIVE 10/18/2016 2147   LEUKOCYTESUR NEGATIVE 10/18/2016 2147    Radiological Exams on Admission: Ct Abdomen Pelvis Wo Contrast  Result Date: 10/18/2016 CLINICAL DATA:  Worsening abdominal distention and pain for several days.  Seen at another facility yesterday. EXAM: CT ABDOMEN AND PELVIS WITHOUT CONTRAST TECHNIQUE: Multidetector CT imaging of the abdomen and pelvis was performed following the standard protocol without IV contrast. COMPARISON:  10/17/2016 FINDINGS: Lower chest: Unchanged small right pleural effusion and minimal atelectatic appearing linear lung base opacities. Hepatobiliary: No focal liver abnormality is seen. No gallstones, gallbladder wall thickening, or biliary dilatation. Pancreas: Unremarkable. No pancreatic ductal dilatation or surrounding inflammatory changes. Spleen: Normal in size without focal abnormality. Adrenals/Urinary Tract: Both adrenals are unremarkable. Stable scarring of the kidneys. No suspicious renal parenchymal lesions. No hydronephrosis. No urinary calculi. Unremarkable ureters. Urinary bladder is decompressed around a Foley catheter. Stomach/Bowel: New marked dilatation of proximal and mid small bowel. Decompressed distal small bowel. Enteric contrast persists in the small bowel from yesterday's CT, although some of the contrast has reached the transverse colon. There is mild mesenteric edema. Colon is unremarkable except for uncomplicated diverticulosis. Vascular/Lymphatic: Unchanged 6-7 cm abdominal aortic  aneurysm with endovascular stent graft. No retroperitoneal hemorrhage. Reproductive: Unchanged 16 cm cystic mass in the pelvis, likely of right ovarian origin. Other: Small volume ascites, increased from yesterday. No free intraperitoneal air. Musculoskeletal: No significant skeletal lesion. IMPRESSION: 1. Small bowel obstruction, transition point probably in the low abdomen to the left of midline. 2. Small volume ascites, increased from yesterday. 3. Small right pleural effusion and minimal atelectatic lung base opacities, unchanged. 4. Unchanged abdominal aortic aneurysm with endovascular stent graft. Unchanged 16 cm cystic pelvic mass. Uncomplicated mild colonic diverticulosis. Electronically Signed   By: Andreas Newport M.D.   On: 10/18/2016 23:00   Dg Abdomen 1 View  Result Date: 10/19/2016 CLINICAL DATA:  Nasogastric tube placement EXAM: ABDOMEN - 1 VIEW COMPARISON:  CT 10/18/2016 FINDINGS: The nasogastric tube extends into the stomach, tip may be in the proximal duodenum. Persistent dilated stacked small bowel loops consistent with obstruction. No extraluminal air is evident. IMPRESSION: 1. NG tube extends well into the stomach, possibly into the proximal duodenum. 2. Obstructive gas pattern with dilated stacked small bowel loops. Electronically Signed   By: Andreas Newport M.D.   On: 10/19/2016 00:15   Dg Chest Portable 1 View  Result Date: 10/18/2016 CLINICAL DATA:  Abdominal distension for 8 days EXAM: PORTABLE CHEST 1 VIEW COMPARISON:  CT 01/13/2010 FINDINGS: Cardiomegaly with generous appearance of mediastinum. Aortic atherosclerosis. Small pleural effusions. Streaky bibasilar atelectasis or infiltrates. Diffuse interstitial prominence. No pneumothorax. IMPRESSION: 1. Cardiomegaly with tiny pleural effusions and streaky bibasilar atelectasis or infiltrates 2. Prominent mediastinum, uncertain chronicity and likely augmented by portable technique and rotation. If mediastinal abnormality is  suspected, CT chest follow-up could be obtained for further evaluation 3. Mild diffuse interstitial opacity, suspect that this is related to chronic changes. Electronically Signed   By: Donavan Foil M.D.   On: 10/18/2016 23:06    EKG: Independently reviewed.  Assessment/Plan Principal Problem:   SBO (small bowel obstruction) (HCC) Active Problems:   AKI (acute kidney injury) ([place])   A-fib (HCC)   DM2 (diabetes mellitus, type 2) (HCC)   Pelvic mass    1. SBO - 1. NPO 2. NGT 3. IVF 4. Call surgery in AM 2. AKI - 1. IVF 2. Holding ARB 3. Repeat BMP in AM 3. DM2 - 1. Sensitive scale SSI Q4H 4. Pelvic mass -  1. Chronic and apparently stable despite its 16cm size per Gyn/onc note 5. A.Fib - 1. Continue rate control 2. Continue coumadin  DVT prophylaxis: Coumadin Code Status: Full Family Communication: Family at  bedside Disposition Plan: Home after admit Consults called: None Admission status: Admit to inpatient   Etta Quill DO [facility] Pager [phone]  If 7AM-7PM, please contact day team taking care of patient [email or web address] Password [email or web address]  10/19/2016, 3:32 AM

## 2016-10-19 NOTE — Progress Notes (Signed)
ANTICOAGULATION CONSULT NOTE - Initial Consult  Pharmacy Consult for warfarin Indication: atrial fibrillation  Allergies  Allergen Reactions  . Ace Inhibitors   . Contrast Media [Iodinated Diagnostic Agents]   . Nsaids     Patient Measurements: Height: 5\' 2"  (157.5 cm) Weight: 165 lb (74.8 kg) IBW/kg (Calculated) : 50.1   Vital Signs: Temp: 98.1 F (36.7 C) (09/04 0213) Temp Source: Oral (09/04 0213) BP: 104/81 (09/04 0213) Pulse Rate: 88 (09/04 0213)  Labs:  Recent Labs  10/18/16 2147  HGB 13.4  HCT 42.7  PLT 124*  LABPROT 28.3*  INR 2.68  CREATININE 2.29*    Estimated Creatinine Clearance: 16.4 mL/min (A) (by C-G formula based on SCr of 2.29 mg/dL (H)).   Medical History: Past Medical History:  Diagnosis Date  . Anticoagulated on Coumadin   . Arthritis   . Diabetes mellitus without complication ([place])   . Diverticulosis   . Dysrhythmia    Atrial fib  . Hx of colonic polyps   . Hyperlipidemia   . Hypertension   . Right renal mass     Medications:  Scheduled:  . amLODipine  10 mg Oral Daily  . atorvastatin  20 mg Oral q1800  . diltiazem  120 mg Oral Daily  . fentaNYL (SUBLIMAZE) injection  50 mcg Intravenous Once  . insulin aspart  0-9 Units Subcutaneous Q4H  . metoprolol tartrate  50 mg Oral BID   Infusions:  . sodium chloride 125 mL/hr at 10/19/16 0000  . piperacillin-tazobactam (ZOSYN)  IV      Assessment: 62 yoF with abdominal pain.  Warfarin for A-fib. F/u home dose.  LD unsure .  INR=2.68 on admission Goal of Therapy:  INR 2-3 Monitor platelets by anticoagulation protocol: Yes   Plan:  Daily PT/NR  Dorrene German 10/19/2016,3:24 AM

## 2016-10-19 NOTE — Progress Notes (Signed)
Patient given AM medications, but vomited a large amount immediately after. Unsure of how much patient retained. Will continue to monitor.

## 2016-10-20 ENCOUNTER — Inpatient Hospital Stay (HOSPITAL_COMMUNITY): Payer: Medicare Other

## 2016-10-20 LAB — PROTIME-INR
INR: 3.66
Prothrombin Time: 36.1 seconds — ABNORMAL HIGH (ref 11.4–15.2)

## 2016-10-20 LAB — CBC
HEMATOCRIT: 35.6 % — AB (ref 36.0–46.0)
HEMOGLOBIN: 11.2 g/dL — AB (ref 12.0–15.0)
MCH: 25.7 pg — ABNORMAL LOW (ref 26.0–34.0)
MCHC: 31.5 g/dL (ref 30.0–36.0)
MCV: 81.7 fL (ref 78.0–100.0)
Platelets: 114 10*3/uL — ABNORMAL LOW (ref 150–400)
RBC: 4.36 MIL/uL (ref 3.87–5.11)
RDW: 14.5 % (ref 11.5–15.5)
WBC: 3.2 10*3/uL — AB (ref 4.0–10.5)

## 2016-10-20 LAB — BASIC METABOLIC PANEL
Anion gap: 9 (ref 5–15)
BUN: 36 mg/dL — ABNORMAL HIGH (ref 6–20)
CHLORIDE: 104 mmol/L (ref 101–111)
CO2: 24 mmol/L (ref 22–32)
Calcium: 9 mg/dL (ref 8.9–10.3)
Creatinine, Ser: 1.23 mg/dL — ABNORMAL HIGH (ref 0.44–1.00)
GFR calc Af Amer: 44 mL/min — ABNORMAL LOW (ref >60)
GFR, EST NON AFRICAN AMERICAN: 38 mL/min — AB (ref >60)
GLUCOSE: 170 mg/dL — AB (ref 65–99)
POTASSIUM: 2.9 mmol/L — AB (ref 3.5–5.1)
Sodium: 137 mmol/L (ref 135–145)

## 2016-10-20 LAB — GLUCOSE, CAPILLARY
GLUCOSE-CAPILLARY: 168 mg/dL — AB (ref 65–99)
GLUCOSE-CAPILLARY: 138 mg/dL — AB (ref 65–99)
Glucose-Capillary: 173 mg/dL — ABNORMAL HIGH (ref 65–99)
Glucose-Capillary: 152 mg/dL — ABNORMAL HIGH (ref 65–99)
Glucose-Capillary: 134 mg/dL — ABNORMAL HIGH (ref 65–99)

## 2016-10-20 LAB — URINE CULTURE: CULTURE: NO GROWTH

## 2016-10-20 MED ORDER — POTASSIUM CHLORIDE 10 MEQ/100ML IV SOLN
10.0000 meq | INTRAVENOUS | Status: AC
  Administered 2016-10-20 (×3): 10 meq via INTRAVENOUS
  Filled 2016-10-20 (×3): qty 100

## 2016-10-20 MED ORDER — FENTANYL CITRATE (PF) 100 MCG/2ML IJ SOLN
12.5000 ug | INTRAMUSCULAR | Status: DC | PRN
  Administered 2016-10-21: 25 ug via INTRAVENOUS
  Administered 2016-10-22: 12.5 ug via INTRAVENOUS
  Administered 2016-10-22 (×3): 25 ug via INTRAVENOUS
  Administered 2016-10-23 – 2016-10-24 (×2): 12.5 ug via INTRAVENOUS
  Filled 2016-10-20 (×8): qty 2

## 2016-10-20 MED ORDER — MAGNESIUM SULFATE 2 GM/50ML IV SOLN
2.0000 g | Freq: Once | INTRAVENOUS | Status: AC
  Administered 2016-10-20: 2 g via INTRAVENOUS
  Filled 2016-10-20: qty 50

## 2016-10-20 NOTE — Progress Notes (Addendum)
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  Patient presents to the ED at [facility] with c/o ongoing abd pain and distention. Symptoms onset about 1 week ago. Initially thought it was UTI and got Macrobid from PCP. Went to [facility] 24 hours ago, CT scan and lab work showed large pelvic mass, was referred to Gyn/onc for follow up, although gyn onc notes in their note that mass appears stable and not really changed for several years now (despite its large size). Returns to [facility] today with worsening abd pain.   ED Course:Repeat CT shows SBO. Also has ascites that are increased from yesterday. Otherwise has unchanged AAA EVAR and pelvic mass.    Subjective   Patient seen and examined, denies abdominal pain.   Assessment/Plan:     1. Small bowel obstruction- NG tube in place, continue IV fluids. General surgery following. Will discontinue IV Zosyn. 2. Hypokalemia-potassium 2.9 this morning, will replace potassium with IV KCl 10 mg 3. Follow BMP in a.m. 3. Acute kidney injury-creatinine improved with IV fluids. Today creatinine is 1.23. Renal ultrasound showed Dr. distention. Foley catheter placed. Hold Cozaar. Follow BMP in a.m. 4. Cystic pelvic mass- unchanged 6 mm cystic pelvic mass. Followed by OB/GYN as outpatient. 5. Lactic acidosis-resolved, repeat lactate 1.1  On 10/19/2016 will discontinue IV Zosyn. Blood cultures and urine cultures are negative so far 6. Atrial fibrillation- Coumadin is on hold. Continue IV metoprolol 2.5 mg every 6 hours. 7. Diabetes mellitus-continue sliding scale insulin with NovoLog. 8. Hypertension- blood pressure is stable.     DVT prophylaxis: SCDs, INR 3.66  Code Status: Full  code  Family Communication: Discussed with patient's daughter at bedside   Disposition Plan: To be Determined   Consultants:  General surgery  Procedures:  None  Continuous infusions . dextrose 5 % and 0.9% NaCl 1,000 mL (10/20/16 0542)      Antibiotics:   Anti-infectives    Start     Dose/Rate Route Frequency Ordered Stop   10/19/16 0600  piperacillin-tazobactam (ZOSYN) IVPB 2.25 g  Status:  Discontinued     2.25 g 100 mL/hr over 30 Minutes Intravenous Every 6 hours 10/18/16 2303 10/20/16 1106   10/18/16 2215  piperacillin-tazobactam (ZOSYN) IVPB 3.375 g     3.375 g 100 mL/hr over 30 Minutes Intravenous  Once 10/18/16 2214 10/18/16 2310       Objective   Vitals:   10/20/16 0100 10/20/16 0501 10/20/16 1400 10/20/16 1756  BP: (!) 103/47 118/73 117/67 129/63  Pulse: (!) 111 100 97 100  Resp: 15 14  16   Temp: 99.6 F (37.6 C) 98.1 F (36.7 C) 98 F (36.7 C)   TempSrc: Oral Oral Oral   SpO2: 91% 92% 90% 90%  Weight:      Height:        Intake/Output Summary (Last 24 hours) at 10/20/16 1907 Last data filed at 10/20/16 1800  Gross per 24 hour  Intake          3510.01 ml  Output             1450 ml  Net          2060.01 ml   Filed Weights   10/18/16 2121 10/19/16 0213  Weight:  74.8 kg (165 lb) 74.8 kg (165 lb)     Physical Examination:   Physical Exam: Eyes: No icterus, extraocular muscles intact  Mouth: Oral mucosa is moist, no lesions on palate,  Neck: Supple, no deformities, masses, or tenderness Lungs: Normal respiratory effort, bilateral clear to auscultation, no crackles or wheezes.  Heart: Regular rate and rhythm, S1 and S2 normal, no murmurs, rubs auscultated Abdomen: Distended, absent bowel sounds Extremities:  no erythema, no cyanosis, no clubbing, bilateral 1+ pitting edema Neuro : Alert and oriented to time, place and person, No focal deficits  Skin: No rashes seen on exam     Data Reviewed: I have personally reviewed following  labs and imaging studies  CBG:  Recent Labs Lab 10/19/16 2358 10/20/16 0447 10/20/16 0746 10/20/16 1147 10/20/16 1611  GLUCAP 144* 173* 168* 152* 134*    CBC:  Recent Labs Lab 10/18/16 2147 10/19/16 0357 10/20/16 0519  WBC 9.6 8.1 3.2*  NEUTROABS 7.2  --   --   HGB 13.4 12.6 11.2*  HCT 42.7 39.1 35.6*  MCV 81.8 81.0 81.7  PLT 124* 106* 114*    Basic Metabolic Panel:  Recent Labs Lab 10/18/16 2147 10/19/16 0357 10/19/16 1405 10/20/16 0519  NA 131* 135  --  137  K 4.2 4.0  --  2.9*  CL 94* 101  --  104  CO2 25 22  --  24  GLUCOSE 187* 139*  --  170*  BUN 39* 41*  --  36*  CREATININE 2.29* 1.99*  --  1.23*  CALCIUM 9.9 9.4  --  9.0  MG  --   --  1.6*  --     Recent Results (from the past 240 hour(s))  Blood Culture (routine x 2)     Status: None (Preliminary result)   Collection Time: 10/18/16  9:40 PM  Result Value Ref Range Status   Specimen Description BLOOD RIGHT WRIST  Final   Special Requests   Final    BOTTLES DRAWN AEROBIC AND ANAEROBIC Blood Culture adequate volume   Culture   Final    NO GROWTH 1 DAY Performed at [facility], [address] [place], [place] [address]    Report Status PENDING  Incomplete  Urine culture     Status: None   Collection Time: 10/18/16  9:47 PM  Result Value Ref Range Status   Specimen Description URINE, CATHETERIZED  Final   Special Requests NONE  Final   Culture   Final    NO GROWTH Performed at [facility], [address] [place], [place] [address]    Report Status 10/20/2016 FINAL  Final  Blood Culture (routine x 2)     Status: None (Preliminary result)   Collection Time: 10/18/16 10:40 PM  Result Value Ref Range Status   Specimen Description BLOOD RIGHT WRIST  Final   Special Requests IN PEDIATRIC BOTTLE Blood Culture adequate volume  Final   Culture   Final    NO GROWTH 1 DAY Performed at [facility], [place] [address], [place], [place] [address]    Report Status PENDING   Incomplete     Liver Function Tests:  Recent Labs Lab 10/18/16 2147  AST 27  ALT 13*  ALKPHOS 62  BILITOT 1.1  PROT 6.9  ALBUMIN 3.4*   No results for input(s): LIPASE, AMYLASE in the last 168 hours. No results for input(s): AMMONIA in the last 168 hours.  Cardiac Enzymes: No results  for input(s): CKTOTAL, CKMB, CKMBINDEX, TROPONINI in the last 168 hours. BNP (last 3 results) No results for input(s): BNP in the last 8760 hours.  ProBNP (last 3 results) No results for input(s): PROBNP in the last 8760 hours.    Studies: Ct Abdomen Pelvis Wo Contrast  Result Date: 10/18/2016 CLINICAL DATA:  Worsening abdominal distention and pain for several days. Seen at another facility yesterday. EXAM: CT ABDOMEN AND PELVIS WITHOUT CONTRAST TECHNIQUE: Multidetector CT imaging of the abdomen and pelvis was performed following the standard protocol without IV contrast. COMPARISON:  10/17/2016 FINDINGS: Lower chest: Unchanged small right pleural effusion and minimal atelectatic appearing linear lung base opacities. Hepatobiliary: No focal liver abnormality is seen. No gallstones, gallbladder wall thickening, or biliary dilatation. Pancreas: Unremarkable. No pancreatic ductal dilatation or surrounding inflammatory changes. Spleen: Normal in size without focal abnormality. Adrenals/Urinary Tract: Both adrenals are unremarkable. Stable scarring of the kidneys. No suspicious renal parenchymal lesions. No hydronephrosis. No urinary calculi. Unremarkable ureters. Urinary bladder is decompressed around a Foley catheter. Stomach/Bowel: New marked dilatation of proximal and mid small bowel. Decompressed distal small bowel. Enteric contrast persists in the small bowel from yesterday's CT, although some of the contrast has reached the transverse colon. There is mild mesenteric edema. Colon is unremarkable except for uncomplicated diverticulosis. Vascular/Lymphatic: Unchanged 6-7 cm abdominal aortic aneurysm with  endovascular stent graft. No retroperitoneal hemorrhage. Reproductive: Unchanged 16 cm cystic mass in the pelvis, likely of right ovarian origin. Other: Small volume ascites, increased from yesterday. No free intraperitoneal air. Musculoskeletal: No significant skeletal lesion. IMPRESSION: 1. Small bowel obstruction, transition point probably in the low abdomen to the left of midline. 2. Small volume ascites, increased from yesterday. 3. Small right pleural effusion and minimal atelectatic lung base opacities, unchanged. 4. Unchanged abdominal aortic aneurysm with endovascular stent graft. Unchanged 16 cm cystic pelvic mass. Uncomplicated mild colonic diverticulosis. Electronically Signed   By: Andreas Newport M.D.   On: 10/18/2016 23:00   Dg Abdomen 1 View  Result Date: 10/19/2016 CLINICAL DATA:  Nasogastric tube placement EXAM: ABDOMEN - 1 VIEW COMPARISON:  CT 10/18/2016 FINDINGS: The nasogastric tube extends into the stomach, tip may be in the proximal duodenum. Persistent dilated stacked small bowel loops consistent with obstruction. No extraluminal air is evident. IMPRESSION: 1. NG tube extends well into the stomach, possibly into the proximal duodenum. 2. Obstructive gas pattern with dilated stacked small bowel loops. Electronically Signed   By: Andreas Newport M.D.   On: 10/19/2016 00:15   US Renal  Result Date: 10/20/2016 CLINICAL DATA:  Acute kidney injury. EXAM: RENAL / URINARY TRACT ULTRASOUND COMPLETE COMPARISON:  Abdomen and pelvis CT dated 10/18/2016. FINDINGS: Right Kidney: Length: 10.9 cm. Mildly echogenic. Diffuse parenchymal thinning and prominent renal sinus fat. No hydronephrosis. Left Kidney: Length: 11.9 cm. Mildly echogenic. Diffuse parenchymal thinning and prominent renal sinus fat. 2.2 cm mid renal cyst. There is also a 1.4 cm upper pole cyst with internal echoes. No hydronephrosis. Bladder: Dilated urinary bladder with a calculated volume of 1075 cc. Small inferior bladder  diverticulum. IMPRESSION: 1. Dilated urinary bladder with a small inferior diverticulum. 2. Mildly echogenic kidneys compatible with medical renal disease. 3. Moderate bilateral renal parenchymal atrophy. 4. No hydronephrosis. Electronically Signed   By: Claudie Revering M.D.   On: 10/20/2016 11:46   Dg Chest Portable 1 View  Result Date: 10/18/2016 CLINICAL DATA:  Abdominal distension for 8 days EXAM: PORTABLE CHEST 1 VIEW COMPARISON:  CT 01/13/2010 FINDINGS: Cardiomegaly with generous appearance  of mediastinum. Aortic atherosclerosis. Small pleural effusions. Streaky bibasilar atelectasis or infiltrates. Diffuse interstitial prominence. No pneumothorax. IMPRESSION: 1. Cardiomegaly with tiny pleural effusions and streaky bibasilar atelectasis or infiltrates 2. Prominent mediastinum, uncertain chronicity and likely augmented by portable technique and rotation. If mediastinal abnormality is suspected, CT chest follow-up could be obtained for further evaluation 3. Mild diffuse interstitial opacity, suspect that this is related to chronic changes. Electronically Signed   By: Donavan Foil M.D.   On: 10/18/2016 23:06   Dg Abd Portable 1v  Result Date: 10/20/2016 CLINICAL DATA:  NG tube placement EXAM: PORTABLE ABDOMEN - 1 VIEW COMPARISON:  10/20/2016 FINDINGS: NG tube has been advanced slightly with the tip in the proximal stomach. The side-port is likely near the GE junction. Continued gaseous distention of bowel. IMPRESSION: NG tube slightly advanced with the tip in the proximal stomach. The side port appears to be near the GE junction. Electronically Signed   By: Rolm Baptise M.D.   On: 10/20/2016 11:36   Dg Abd Portable 1v  Result Date: 10/20/2016 CLINICAL DATA:  Small bowel obstruction, follow-up EXAM: PORTABLE ABDOMEN - 1 VIEW COMPARISON:  10/18/2016 abdominal radiograph FINDINGS: Aorto bi-iliac stent graft is in place. Enteric tube terminates at the esophagogastric junction. There are mildly to moderately  dilated small bowel loops in the central abdomen bilaterally measuring up to the 4.9 cm diameter, decreased in the interval, previously 6.9 cm diameter. Retained oral contrast in the large bowel. Mild-to-moderate colonic gas and stool. No evidence of pneumatosis or pneumoperitoneum. No radiopaque urolithiasis. Stable hazy right lung base opacities. IMPRESSION: 1. Enteric tube terminates at the esophagogastric junction, consider advancing 6-8 cm. 2. Persistent mild-to-moderate central small bowel dilatation, decreased in the interval, suggesting improving distal small bowel obstruction. These results will be called to the ordering clinician or representative by the Radiologist Assistant, and communication documented in the PACS or zVision Dashboard. Electronically Signed   By: Ilona Sorrel M.D.   On: 10/20/2016 08:34    Scheduled Meds: . insulin aspart  0-9 Units Subcutaneous Q4H  . metoprolol tartrate  2.5 mg Intravenous Q6H      Time spent: 25 min  [facility] Pager [phone]. If 7PM-7AM, please contact night-coverage at [email or web address], Office  [phone]  password [email or web address]  10/20/2016, 7:07 PM  LOS: 2 days

## 2016-10-20 NOTE — Progress Notes (Signed)
[facility] Progress Note     Subjective: CC:  More alert today. Reports significant, diffuse abdominal pain this morning along with back pain, improved s/p IV pain medication. Denies nausea or vomiting. Unsure if passing flatus but does not feel she is passing much. No BM. Sat up in chair for a while this morning but not ambulating in hall.  Objective: Vital signs in last 24 hours: Temp:  [98.1 F (36.7 C)-99.8 F (37.7 C)] 98.1 F (36.7 C) (09/05 0501) Pulse Rate:  [91-111] 100 (09/05 0501) Resp:  [14-17] 14 (09/05 0501) BP: (103-128)/(47-87) 118/73 (09/05 0501) SpO2:  [91 %-92 %] 92 % (09/05 0501)    Intake/Output from previous day: 09/04 0701 - 09/05 0700 In: 2775.8 [I.V.:2575.8; IV Piggyback:200] Out: 2800 [Urine:950; Emesis/NG output:1850] Intake/Output this shift: Total I/O In: 587.5 [I.V.:537.5; IV Piggyback:50] Out: -   PE: Gen:  Alert, NAD, pleasant HEENT: pupils equal and round, EOMs in tact Card:  Regular rate and rhythm, pedal pulses 2+ BL Pulm:  Normal effort, clear to auscultation bilaterally Abd: Soft, non-tender, mild to moderate distention, tinkering bowel sounds present in all 4 quadrants Skin: warm and dry, no rashes  Psych: A&Ox3   Lab Results:   Recent Labs  10/19/16 0357 10/20/16 0519  WBC 8.1 3.2*  HGB 12.6 11.2*  HCT 39.1 35.6*  PLT 106* 114*   BMET  Recent Labs  10/19/16 0357 10/20/16 0519  NA 135 137  K 4.0 2.9*  CL 101 104  CO2 22 24  GLUCOSE 139* 170*  BUN 41* 36*  CREATININE 1.99* 1.23*  CALCIUM 9.4 9.0   PT/INR  Recent Labs  10/19/16 0357 10/20/16 0519  LABPROT 30.8* 36.1*  INR 2.99 3.66   CMP     Component Value Date/Time   NA 137 10/20/2016 0519   K 2.9 (L) 10/20/2016 0519   CL 104 10/20/2016 0519   CO2 24 10/20/2016 0519   GLUCOSE 170 (H) 10/20/2016 0519   BUN 36 (H) 10/20/2016 0519   CREATININE 1.23 (H) 10/20/2016 0519   CALCIUM 9.0 10/20/2016 0519   PROT 6.9 10/18/2016 2147   ALBUMIN  3.4 (L) 10/18/2016 2147   AST 27 10/18/2016 2147   ALT 13 (L) 10/18/2016 2147   ALKPHOS 62 10/18/2016 2147   BILITOT 1.1 10/18/2016 2147   GFRNONAA 38 (L) 10/20/2016 0519   GFRAA 44 (L) 10/20/2016 0519   Lipase  No results found for: LIPASE     Studies/Results: Ct Abdomen Pelvis Wo Contrast  Result Date: 10/18/2016 CLINICAL DATA:  Worsening abdominal distention and pain for several days. Seen at another facility yesterday. EXAM: CT ABDOMEN AND PELVIS WITHOUT CONTRAST TECHNIQUE: Multidetector CT imaging of the abdomen and pelvis was performed following the standard protocol without IV contrast. COMPARISON:  10/17/2016 FINDINGS: Lower chest: Unchanged small right pleural effusion and minimal atelectatic appearing linear lung base opacities. Hepatobiliary: No focal liver abnormality is seen. No gallstones, gallbladder wall thickening, or biliary dilatation. Pancreas: Unremarkable. No pancreatic ductal dilatation or surrounding inflammatory changes. Spleen: Normal in size without focal abnormality. Adrenals/Urinary Tract: Both adrenals are unremarkable. Stable scarring of the kidneys. No suspicious renal parenchymal lesions. No hydronephrosis. No urinary calculi. Unremarkable ureters. Urinary bladder is decompressed around a Foley catheter. Stomach/Bowel: New marked dilatation of proximal and mid small bowel. Decompressed distal small bowel. Enteric contrast persists in the small bowel from yesterday's CT, although some of the contrast has reached the transverse colon. There is mild mesenteric edema. Colon is unremarkable except  for uncomplicated diverticulosis. Vascular/Lymphatic: Unchanged 6-7 cm abdominal aortic aneurysm with endovascular stent graft. No retroperitoneal hemorrhage. Reproductive: Unchanged 16 cm cystic mass in the pelvis, likely of right ovarian origin. Other: Small volume ascites, increased from yesterday. No free intraperitoneal air. Musculoskeletal: No significant skeletal lesion.  IMPRESSION: 1. Small bowel obstruction, transition point probably in the low abdomen to the left of midline. 2. Small volume ascites, increased from yesterday. 3. Small right pleural effusion and minimal atelectatic lung base opacities, unchanged. 4. Unchanged abdominal aortic aneurysm with endovascular stent graft. Unchanged 16 cm cystic pelvic mass. Uncomplicated mild colonic diverticulosis. Electronically Signed   By: Andreas Newport M.D.   On: 10/18/2016 23:00   Dg Abdomen 1 View  Result Date: 10/19/2016 CLINICAL DATA:  Nasogastric tube placement EXAM: ABDOMEN - 1 VIEW COMPARISON:  CT 10/18/2016 FINDINGS: The nasogastric tube extends into the stomach, tip may be in the proximal duodenum. Persistent dilated stacked small bowel loops consistent with obstruction. No extraluminal air is evident. IMPRESSION: 1. NG tube extends well into the stomach, possibly into the proximal duodenum. 2. Obstructive gas pattern with dilated stacked small bowel loops. Electronically Signed   By: Andreas Newport M.D.   On: 10/19/2016 00:15   Dg Chest Portable 1 View  Result Date: 10/18/2016 CLINICAL DATA:  Abdominal distension for 8 days EXAM: PORTABLE CHEST 1 VIEW COMPARISON:  CT 01/13/2010 FINDINGS: Cardiomegaly with generous appearance of mediastinum. Aortic atherosclerosis. Small pleural effusions. Streaky bibasilar atelectasis or infiltrates. Diffuse interstitial prominence. No pneumothorax. IMPRESSION: 1. Cardiomegaly with tiny pleural effusions and streaky bibasilar atelectasis or infiltrates 2. Prominent mediastinum, uncertain chronicity and likely augmented by portable technique and rotation. If mediastinal abnormality is suspected, CT chest follow-up could be obtained for further evaluation 3. Mild diffuse interstitial opacity, suspect that this is related to chronic changes. Electronically Signed   By: Donavan Foil M.D.   On: 10/18/2016 23:06   Dg Abd Portable 1v  Result Date: 10/20/2016 CLINICAL DATA:  Small  bowel obstruction, follow-up EXAM: PORTABLE ABDOMEN - 1 VIEW COMPARISON:  10/18/2016 abdominal radiograph FINDINGS: Aorto bi-iliac stent graft is in place. Enteric tube terminates at the esophagogastric junction. There are mildly to moderately dilated small bowel loops in the central abdomen bilaterally measuring up to the 4.9 cm diameter, decreased in the interval, previously 6.9 cm diameter. Retained oral contrast in the large bowel. Mild-to-moderate colonic gas and stool. No evidence of pneumatosis or pneumoperitoneum. No radiopaque urolithiasis. Stable hazy right lung base opacities. IMPRESSION: 1. Enteric tube terminates at the esophagogastric junction, consider advancing 6-8 cm. 2. Persistent mild-to-moderate central small bowel dilatation, decreased in the interval, suggesting improving distal small bowel obstruction. These results will be called to the ordering clinician or representative by the Radiologist Assistant, and communication documented in the PACS or zVision Dashboard. Electronically Signed   By: Ilona Sorrel M.D.   On: 10/20/2016 08:34    Anti-infectives: Anti-infectives    Start     Dose/Rate Route Frequency Ordered Stop   10/19/16 0600  piperacillin-tazobactam (ZOSYN) IVPB 2.25 g     2.25 g 100 mL/hr over 30 Minutes Intravenous Every 6 hours 10/18/16 2303     10/18/16 2215  piperacillin-tazobactam (ZOSYN) IVPB 3.375 g     3.375 g 100 mL/hr over 30 Minutes Intravenous  Once 10/18/16 2214 10/18/16 2310       Assessment/Plan SBO  - suspect 2/2 to intra-abdominal adhesions, but possibly pelvic mass - SB protocol delay films show contrast in the colon and improved  pSBO pattern. Small bowel is still dilated.  - NGT advanced per radiologist recommendation and film pending. - continue NPO and NGT to LIWS due to no return of bowel function and high NG output.  - repeat AXR in AM - would likely benefit from a bowel regimen once tolerating PO in the setting of baseline constipation.     ** of note, nurse spoke to me directly about results of a follow up bladder scan for urinary retention. foley placed yesterday due to retention. Repeat bladder scan today shows 1,000 cc urine in bladder. Foley not visualized on exam. The fluid filled structure visualized on bladder scan is likely the large pelvic cyst noted on CT. Will defer foley management to primary team. **  Hx Renal cell CA - s/p cryoablation small right renal cell cancer at [place] [facility] end of 2016 AKI  DM2  Pelvic mass - cystic, followed by Gyn/Onc, per notes stable/minimally enlarged compared to 2015 AAA s/p EVAR 2017 Afib on coumadin    LOS: 2 days    Jill Alexanders , [facility] 10/20/2016, 10:49 AM Pager: [phone] Consults: [phone] Mon-Fri 7:00 am-4:30 pm Sat-Sun 7:00 am-11:30 am

## 2016-10-21 ENCOUNTER — Inpatient Hospital Stay (HOSPITAL_COMMUNITY): Payer: Medicare Other

## 2016-10-21 LAB — GLUCOSE, CAPILLARY
GLUCOSE-CAPILLARY: 150 mg/dL — AB (ref 65–99)
GLUCOSE-CAPILLARY: 162 mg/dL — AB (ref 65–99)
Glucose-Capillary: 149 mg/dL — ABNORMAL HIGH (ref 65–99)
Glucose-Capillary: 133 mg/dL — ABNORMAL HIGH (ref 65–99)
Glucose-Capillary: 154 mg/dL — ABNORMAL HIGH (ref 65–99)
Glucose-Capillary: 121 mg/dL — ABNORMAL HIGH (ref 65–99)

## 2016-10-21 LAB — BASIC METABOLIC PANEL
ANION GAP: 5 (ref 5–15)
BUN: 20 mg/dL (ref 6–20)
CALCIUM: 9.1 mg/dL (ref 8.9–10.3)
CO2: 27 mmol/L (ref 22–32)
Chloride: 113 mmol/L — ABNORMAL HIGH (ref 101–111)
Creatinine, Ser: 0.87 mg/dL (ref 0.44–1.00)
GFR, EST NON AFRICAN AMERICAN: 58 mL/min — AB (ref >60)
Glucose, Bld: 165 mg/dL — ABNORMAL HIGH (ref 65–99)
Potassium: 3 mmol/L — ABNORMAL LOW (ref 3.5–5.1)
Sodium: 145 mmol/L (ref 135–145)

## 2016-10-21 LAB — PROTIME-INR
INR: 4.18 — AB
PROTHROMBIN TIME: 40 s — AB (ref 11.4–15.2)

## 2016-10-21 LAB — MAGNESIUM: MAGNESIUM: 1.5 mg/dL — AB (ref 1.7–2.4)

## 2016-10-21 MED ORDER — MAGNESIUM SULFATE 2 GM/50ML IV SOLN
2.0000 g | Freq: Once | INTRAVENOUS | Status: AC
  Administered 2016-10-21: 2 g via INTRAVENOUS
  Filled 2016-10-21 (×3): qty 50

## 2016-10-21 MED ORDER — BISACODYL 10 MG RE SUPP
10.0000 mg | Freq: Once | RECTAL | Status: AC
  Administered 2016-10-21: 10 mg via RECTAL
  Filled 2016-10-21: qty 1

## 2016-10-21 MED ORDER — POTASSIUM CHLORIDE 10 MEQ/100ML IV SOLN
10.0000 meq | INTRAVENOUS | Status: AC
  Administered 2016-10-21 – 2016-10-22 (×3): 10 meq via INTRAVENOUS
  Filled 2016-10-21 (×5): qty 100

## 2016-10-21 NOTE — Care Management Important Message (Signed)
Important Message  Patient Details  Name: Chelsea Dean MRN: [id number] Date of Birth: 19-Nov-1927   Medicare Important Message Given:  Yes    Kerin Salen 10/21/2016, 10:58 AMImportant Message  Patient Details  Name: Chelsea Dean MRN: [id number] Date of Birth: 18-Apr-1927   Medicare Important Message Given:  Yes    Kerin Salen 10/21/2016, 10:58 AM

## 2016-10-21 NOTE — Progress Notes (Signed)
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  Patient presents to the ED at [facility] with c/o ongoing abd pain and distention. Symptoms onset about 1 week ago. Initially thought it was UTI and got Macrobid from PCP. Went to [facility] 24 hours ago, CT scan and lab work showed large pelvic mass, was referred to Gyn/onc for follow up, although gyn onc notes in their note that mass appears stable and not really changed for several years now (despite its large size). Returns to [facility] today with worsening abd pain.   ED Course:Repeat CT shows SBO. Also has ascites that are increased from yesterday. Otherwise has unchanged AAA EVAR and pelvic mass.    Subjective   Patient seen and examined, denies abdominal pain. NG tube in place   Assessment/Plan:     1. Small bowel obstruction- NG tube in place, continue IV fluids. General surgery following. IV Zosyn discontinued 2. Hypokalemia-potassium is still low at 3.0. Will give IV KCl 10 mg 3. Follow BMP and EM 3. Hypomagnesemia-magnesium was 1.5, will give 2 g mag sulfate IV 1. Recheck magnesium in a.m. 4. Acute kidney injury-creatinine improved with IV fluids. Today creatinine is 1.23. Renal ultrasound showed Dr. distention. Foley catheter placed. Hold Cozaar. Follow BMP in a.m. 5. Cystic pelvic mass- unchanged 6 mm cystic pelvic mass. Followed by OB/GYN as outpatient. 6. Lactic acidosis-resolved, repeat lactate 1.1  On 10/19/2016 will discontinue IV Zosyn. Blood cultures and urine cultures are negative so far 7. Atrial fibrillation- Coumadin is on hold. Continue IV metoprolol 2.5 mg every 6 hours. INR 4.88. 8. Diabetes mellitus-continue sliding scale insulin with NovoLog. Blood  glucose controlled. 9. Hypertension- blood pressure is stable.     DVT prophylaxis: SCDs,   Code Status: Full code  Family Communication: Discussed with patient's daughter at bedside   Disposition Plan: To be Determined   Consultants:  General surgery  Procedures:  None  Continuous infusions . dextrose 5 % and 0.9% NaCl 125 mL/hr at 10/21/16 1432      Antibiotics:   Anti-infectives    Start     Dose/Rate Route Frequency Ordered Stop   10/19/16 0600  piperacillin-tazobactam (ZOSYN) IVPB 2.25 g  Status:  Discontinued     2.25 g 100 mL/hr over 30 Minutes Intravenous Every 6 hours 10/18/16 2303 10/20/16 1106   10/18/16 2215  piperacillin-tazobactam (ZOSYN) IVPB 3.375 g     3.375 g 100 mL/hr over 30 Minutes Intravenous  Once 10/18/16 2214 10/18/16 2310       Objective   Vitals:   10/21/16 0424 10/21/16 0632 10/21/16 0905 10/21/16 1409  BP: (!) 145/63 (!) 134/101 126/73 133/74  Pulse: (!) 116 (!) 104 99 (!) 115  Resp: 20     Temp: 100 F (37.8 C)  98.3 F (36.8 C) 98.5 F (36.9 C)  TempSrc: Oral  Oral Oral  SpO2: 93%  97% 93%  Weight:      Height:        Intake/Output Summary (Last 24 hours) at 10/21/16 1846 Last data filed at 10/21/16 1509  Gross per 24 hour  Intake             1520 ml  Output  1875 ml  Net             -355 ml   Filed Weights   10/18/16 2121 10/19/16 0213  Weight: 74.8 kg (165 lb) 74.8 kg (165 lb)     Physical Examination:  Physical Exam: Eyes: No icterus, extraocular muscles intact  Mouth: Oral mucosa is moist, no lesions on palate,  Neck: Supple, no deformities, masses, or tenderness Lungs: Normal respiratory effort, bilateral clear to auscultation, no crackles or wheezes.  Heart: Regular rate and rhythm, S1 and S2 normal, no murmurs, rubs auscultated Abdomen: BS normoactive,soft,nondistended,non-tender to palpation,no organomegaly Extremities: No pretibial edema, no erythema, no cyanosis, no clubbing Neuro :  Alert and oriented to time, place and person, No focal deficits      Data Reviewed: I have personally reviewed following labs and imaging studies  CBG:  Recent Labs Lab 10/21/16 0000 10/21/16 0413 10/21/16 0754 10/21/16 1226 10/21/16 1449  GLUCAP 150* 162* 149* 133* 154*    CBC:  Recent Labs Lab 10/18/16 2147 10/19/16 0357 10/20/16 0519  WBC 9.6 8.1 3.2*  NEUTROABS 7.2  --   --   HGB 13.4 12.6 11.2*  HCT 42.7 39.1 35.6*  MCV 81.8 81.0 81.7  PLT 124* 106* 114*    Basic Metabolic Panel:  Recent Labs Lab 10/18/16 2147 10/19/16 0357 10/19/16 1405 10/20/16 0519 10/21/16 0530  NA 131* 135  --  137 145  K 4.2 4.0  --  2.9* 3.0*  CL 94* 101  --  104 113*  CO2 25 22  --  24 27  GLUCOSE 187* 139*  --  170* 165*  BUN 39* 41*  --  36* 20  CREATININE 2.29* 1.99*  --  1.23* 0.87  CALCIUM 9.9 9.4  --  9.0 9.1  MG  --   --  1.6*  --  1.5*    Recent Results (from the past 240 hour(s))  Blood Culture (routine x 2)     Status: None (Preliminary result)   Collection Time: 10/18/16  9:40 PM  Result Value Ref Range Status   Specimen Description BLOOD RIGHT WRIST  Final   Special Requests   Final    BOTTLES DRAWN AEROBIC AND ANAEROBIC Blood Culture adequate volume   Culture   Final    NO GROWTH 2 DAYS Performed at [facility], [address], [place], [place] [address]    Report Status PENDING  Incomplete  Urine culture     Status: None   Collection Time: 10/18/16  9:47 PM  Result Value Ref Range Status   Specimen Description URINE, CATHETERIZED  Final   Special Requests NONE  Final   Culture   Final    NO GROWTH Performed at [facility], [address] [place], [place] [address]    Report Status 10/20/2016 FINAL  Final  Blood Culture (routine x 2)     Status: None (Preliminary result)   Collection Time: 10/18/16 10:40 PM  Result Value Ref Range Status   Specimen Description BLOOD RIGHT WRIST  Final   Special Requests IN PEDIATRIC BOTTLE Blood  Culture adequate volume  Final   Culture   Final    NO GROWTH 2 DAYS Performed at [facility], [address], [place], [place] [address]    Report Status PENDING  Incomplete     Liver Function Tests:  Recent Labs Lab 10/18/16 2147  AST 27  ALT 13*  ALKPHOS 62  BILITOT 1.1  PROT 6.9  ALBUMIN 3.4*   No results for input(s): LIPASE, AMYLASE in the last 168 hours. No results for input(s): AMMONIA in the last 168 hours.  Cardiac Enzymes: No results for input(s): CKTOTAL, CKMB, CKMBINDEX, TROPONINI in the last 168 hours. BNP (last 3 results) No results for input(s): BNP in the last 8760 hours.  ProBNP (last 3 results) No results for input(s): PROBNP in the last 8760 hours.    Studies: US Renal  Result Date: 10/20/2016 CLINICAL DATA:  Acute kidney injury. EXAM: RENAL / URINARY TRACT ULTRASOUND COMPLETE COMPARISON:  Abdomen and pelvis CT dated 10/18/2016. FINDINGS: Right Kidney: Length: 10.9 cm. Mildly echogenic. Diffuse parenchymal thinning and prominent renal sinus fat. No hydronephrosis. Left Kidney: Length: 11.9 cm. Mildly echogenic. Diffuse parenchymal thinning and prominent renal sinus fat. 2.2 cm mid renal cyst. There is also a 1.4 cm upper pole cyst with internal echoes. No hydronephrosis. Bladder: Dilated urinary bladder with a calculated volume of 1075 cc. Small inferior bladder diverticulum. IMPRESSION: 1. Dilated urinary bladder with a small inferior diverticulum. 2. Mildly echogenic kidneys compatible with medical renal disease. 3. Moderate bilateral renal parenchymal atrophy. 4. No hydronephrosis. Electronically Signed   By: Claudie Revering M.D.   On: 10/20/2016 11:46   Dg Abd Portable 1v  Result Date: 10/21/2016 CLINICAL DATA:  Small bowel obstruction, followup EXAM: PORTABLE ABDOMEN - 1 VIEW COMPARISON:  Portable exam 1736 hours compared to 0455 hours FINDINGS: Tip of nasogastric tube projects over stomach. Aortoiliac stent. Retained contrast in colon, which appears  decompressed. Distal colonic diverticulosis noted. Dilated small bowel loops in mid abdomen, little changed. No definite bowel wall thickening. Bones demineralized. IMPRESSION: Persistent small while distension little changed from earlier study. Electronically Signed   By: Lavonia Dana M.D.   On: 10/21/2016 18:00   Dg Abd Portable 1v  Result Date: 10/21/2016 CLINICAL DATA:  Follow-up small bowel obstruction EXAM: PORTABLE ABDOMEN - 1 VIEW COMPARISON:  Abdominal radiograph of October 20, 2016 FINDINGS: There remain loops of moderately distended gas-filled small bowel in the mid and left abdomen. There is contrast within the colon from the ascending colon to the rectum. The esophagogastric tubes proximal port lies at or above the GE junction with the tip in the cardia. There is an aorto bi-iliac stent graft. IMPRESSION: Persistent small bowel obstruction with perhaps slightly decreased gaseous distention of small-bowel loops. No free extraluminal gas collections are observed. Advancement of the nasogastric tube by 10 cm is recommended to assure that the proximal port is positioned below the GE junction. Electronically Signed   By: David  Martinique M.D.   On: 10/21/2016 07:08   Dg Abd Portable 1v  Result Date: 10/20/2016 CLINICAL DATA:  NG tube placement EXAM: PORTABLE ABDOMEN - 1 VIEW COMPARISON:  10/20/2016 FINDINGS: NG tube has been advanced slightly with the tip in the proximal stomach. The side-port is likely near the GE junction. Continued gaseous distention of bowel. IMPRESSION: NG tube slightly advanced with the tip in the proximal stomach. The side port appears to be near the GE junction. Electronically Signed   By: Rolm Baptise M.D.   On: 10/20/2016 11:36   Dg Abd Portable 1v  Result Date: 10/20/2016 CLINICAL DATA:  Small bowel obstruction, follow-up EXAM: PORTABLE ABDOMEN - 1 VIEW COMPARISON:  10/18/2016 abdominal radiograph FINDINGS: Aorto bi-iliac stent graft is in place. Enteric tube terminates at  the esophagogastric junction. There are mildly to moderately dilated small bowel loops in the central abdomen bilaterally measuring up to the  4.9 cm diameter, decreased in the interval, previously 6.9 cm diameter. Retained oral contrast in the large bowel. Mild-to-moderate colonic gas and stool. No evidence of pneumatosis or pneumoperitoneum. No radiopaque urolithiasis. Stable hazy right lung base opacities. IMPRESSION: 1. Enteric tube terminates at the esophagogastric junction, consider advancing 6-8 cm. 2. Persistent mild-to-moderate central small bowel dilatation, decreased in the interval, suggesting improving distal small bowel obstruction. These results will be called to the ordering clinician or representative by the Radiologist Assistant, and communication documented in the PACS or zVision Dashboard. Electronically Signed   By: Ilona Sorrel M.D.   On: 10/20/2016 08:34    Scheduled Meds: . insulin aspart  0-9 Units Subcutaneous Q4H  . metoprolol tartrate  2.5 mg Intravenous Q6H      Time spent: 25 min  [facility] Pager [phone]. If 7PM-7AM, please contact night-coverage at [email or web address], Office  [phone]  password [place]  10/21/2016, 6:46 PM  LOS: 3 days

## 2016-10-21 NOTE — Progress Notes (Signed)
Lab called with critical lab value of INR 4.18. Dr. Myna Hidalgo, [facility] informed via text page.

## 2016-10-21 NOTE — Progress Notes (Signed)
[facility] Progress Note     Subjective: CC:  Pt complaining of back pain but denies abdominal pain. Daughter at bedside assisting with history. Small amount of flatus yesterday but no BM. Walked in hallway yesterday. Daughter reports NG tube was flushed this morning and it immediately started suctioning better, filling the cannister 1/3 - 1/2 full.   When I asked the patient about abdominal surgery she stated that she understood surgery would result in a large abdominal incision. She also stated that if it would make her better she wanted to have surgery and "get it over with".   Supratherapeutic INR (4.18) noted   Objective: Vital signs in last 24 hours: Temp:  [97.5 F (36.4 C)-100 F (37.8 C)] 100 F (37.8 C) (09/06 0424) Pulse Rate:  [92-132] 104 (09/06 0632) Resp:  [16-20] 20 (09/06 0424) BP: (112-145)/(63-101) 134/101 (09/06 0632) SpO2:  [90 %-94 %] 93 % (09/06 0424)    Intake/Output from previous day: 09/05 0701 - 09/06 0700 In: 3447.5 [P.O.:60; I.V.:3037.5; IV Piggyback:350] Out: 1250 [Urine:1100; Emesis/NG output:150] Intake/Output this shift: No intake/output data recorded.  PE: Gen:  Alert, NAD, pleasant HEENT: pupils equal and round, EOMs in tact Card:  Regular rate and rhythm, pedal pulses 2+ BL Pulm:  Normal effort, clear to auscultation bilaterally Abd: Soft, mild TTP left hemiabdomen, mild distention,  bowel sounds present in all 4 quadrants, no hernias Skin: warm and dry, no rashes  Psych: A&Ox3   Lab Results:   Recent Labs  10/19/16 0357 10/20/16 0519  WBC 8.1 3.2*  HGB 12.6 11.2*  HCT 39.1 35.6*  PLT 106* 114*   BMET  Recent Labs  10/20/16 0519 10/21/16 0530  NA 137 145  K 2.9* 3.0*  CL 104 113*  CO2 24 27  GLUCOSE 170* 165*  BUN 36* 20  CREATININE 1.23* 0.87  CALCIUM 9.0 9.1   PT/INR  Recent Labs  10/20/16 0519 10/21/16 0530  LABPROT 36.1* 40.0*  INR 3.66 4.18*   CMP     Component Value Date/Time   NA 145  10/21/2016 0530   K 3.0 (L) 10/21/2016 0530   CL 113 (H) 10/21/2016 0530   CO2 27 10/21/2016 0530   GLUCOSE 165 (H) 10/21/2016 0530   BUN 20 10/21/2016 0530   CREATININE 0.87 10/21/2016 0530   CALCIUM 9.1 10/21/2016 0530   PROT 6.9 10/18/2016 2147   ALBUMIN 3.4 (L) 10/18/2016 2147   AST 27 10/18/2016 2147   ALT 13 (L) 10/18/2016 2147   ALKPHOS 62 10/18/2016 2147   BILITOT 1.1 10/18/2016 2147   GFRNONAA 58 (L) 10/21/2016 0530   GFRAA >60 10/21/2016 0530   Lipase  No results found for: LIPASE     Studies/Results: US Renal  Result Date: 10/20/2016 CLINICAL DATA:  Acute kidney injury. EXAM: RENAL / URINARY TRACT ULTRASOUND COMPLETE COMPARISON:  Abdomen and pelvis CT dated 10/18/2016. FINDINGS: Right Kidney: Length: 10.9 cm. Mildly echogenic. Diffuse parenchymal thinning and prominent renal sinus fat. No hydronephrosis. Left Kidney: Length: 11.9 cm. Mildly echogenic. Diffuse parenchymal thinning and prominent renal sinus fat. 2.2 cm mid renal cyst. There is also a 1.4 cm upper pole cyst with internal echoes. No hydronephrosis. Bladder: Dilated urinary bladder with a calculated volume of 1075 cc. Small inferior bladder diverticulum. IMPRESSION: 1. Dilated urinary bladder with a small inferior diverticulum. 2. Mildly echogenic kidneys compatible with medical renal disease. 3. Moderate bilateral renal parenchymal atrophy. 4. No hydronephrosis. Electronically Signed   By: Percell Locus.D.  On: 10/20/2016 11:46   Dg Abd Portable 1v  Result Date: 10/21/2016 CLINICAL DATA:  Follow-up small bowel obstruction EXAM: PORTABLE ABDOMEN - 1 VIEW COMPARISON:  Abdominal radiograph of October 20, 2016 FINDINGS: There remain loops of moderately distended gas-filled small bowel in the mid and left abdomen. There is contrast within the colon from the ascending colon to the rectum. The esophagogastric tubes proximal port lies at or above the GE junction with the tip in the cardia. There is an aorto bi-iliac  stent graft. IMPRESSION: Persistent small bowel obstruction with perhaps slightly decreased gaseous distention of small-bowel loops. No free extraluminal gas collections are observed. Advancement of the nasogastric tube by 10 cm is recommended to assure that the proximal port is positioned below the GE junction. Electronically Signed   By: David  Martinique M.D.   On: 10/21/2016 07:08   Dg Abd Portable 1v  Result Date: 10/20/2016 CLINICAL DATA:  NG tube placement EXAM: PORTABLE ABDOMEN - 1 VIEW COMPARISON:  10/20/2016 FINDINGS: NG tube has been advanced slightly with the tip in the proximal stomach. The side-port is likely near the GE junction. Continued gaseous distention of bowel. IMPRESSION: NG tube slightly advanced with the tip in the proximal stomach. The side port appears to be near the GE junction. Electronically Signed   By: Rolm Baptise M.D.   On: 10/20/2016 11:36   Dg Abd Portable 1v  Result Date: 10/20/2016 CLINICAL DATA:  Small bowel obstruction, follow-up EXAM: PORTABLE ABDOMEN - 1 VIEW COMPARISON:  10/18/2016 abdominal radiograph FINDINGS: Aorto bi-iliac stent graft is in place. Enteric tube terminates at the esophagogastric junction. There are mildly to moderately dilated small bowel loops in the central abdomen bilaterally measuring up to the 4.9 cm diameter, decreased in the interval, previously 6.9 cm diameter. Retained oral contrast in the large bowel. Mild-to-moderate colonic gas and stool. No evidence of pneumatosis or pneumoperitoneum. No radiopaque urolithiasis. Stable hazy right lung base opacities. IMPRESSION: 1. Enteric tube terminates at the esophagogastric junction, consider advancing 6-8 cm. 2. Persistent mild-to-moderate central small bowel dilatation, decreased in the interval, suggesting improving distal small bowel obstruction. These results will be called to the ordering clinician or representative by the Radiologist Assistant, and communication documented in the PACS or zVision  Dashboard. Electronically Signed   By: Ilona Sorrel M.D.   On: 10/20/2016 08:34    Anti-infectives: Anti-infectives    Start     Dose/Rate Route Frequency Ordered Stop   10/19/16 0600  piperacillin-tazobactam (ZOSYN) IVPB 2.25 g  Status:  Discontinued     2.25 g 100 mL/hr over 30 Minutes Intravenous Every 6 hours 10/18/16 2303 10/20/16 1106   10/18/16 2215  piperacillin-tazobactam (ZOSYN) IVPB 3.375 g     3.375 g 100 mL/hr over 30 Minutes Intravenous  Once 10/18/16 2214 10/18/16 2310       Assessment/Plan SBO - suspect 2/2 to intra-abdominal adhesions, but possibly pelvic mass - SB protocol delay films show contrast in the colon and improved but persistent pSBO pattern. Small bowel is still dilated.  - patient started having flatus yesterday. No BM.  - continue NPO and NGT to LIWS and await further bowel function. PO contrast reached the colon and patient having small amounts of flatus, hopeful she will improve with non-operative management. Failure to improve or clinical deterioration may require exploratory laparotomy in the next few days.  - add dulcolax suppository today - repeat AXR in AM  - would likely benefit from a bowel regimen once tolerating PO in  the setting of baseline constipation.    Hx Renal cell CA -s/p cryoablation small right renal cell cancer at [facility] end of 2016 AKI - foley care per primary team; suspect abnormal bladder scan (> 1,000cc) 2/2 known, large pelvic mass.  DM2  Pelvic mass - cystic, followed by Gyn/Onc, per notes stable/minimally enlarged compared to 2015 AAA s/p EVAR 2017 Afib on coumadin   LOS: 3 days    Jill Alexanders , [facility] 10/21/2016, 7:58 AM Pager: [phone] Consults: [phone] Mon-Fri 7:00 am-4:30 pm Sat-Sun 7:00 am-11:30 am

## 2016-10-22 ENCOUNTER — Inpatient Hospital Stay (HOSPITAL_COMMUNITY): Payer: Medicare Other

## 2016-10-22 LAB — BASIC METABOLIC PANEL
Anion gap: 5 (ref 5–15)
BUN: 10 mg/dL (ref 6–20)
CHLORIDE: 109 mmol/L (ref 101–111)
CO2: 30 mmol/L (ref 22–32)
Calcium: 9.1 mg/dL (ref 8.9–10.3)
Creatinine, Ser: 0.71 mg/dL (ref 0.44–1.00)
GFR calc Af Amer: >60 mL/min (ref >60)
GFR calc non Af Amer: >60 mL/min (ref >60)
Glucose, Bld: 147 mg/dL — ABNORMAL HIGH (ref 65–99)
POTASSIUM: 2.8 mmol/L — AB (ref 3.5–5.1)
SODIUM: 144 mmol/L (ref 135–145)

## 2016-10-22 LAB — CBC
HEMATOCRIT: 37 % (ref 36.0–46.0)
HEMOGLOBIN: 11.5 g/dL — AB (ref 12.0–15.0)
MCH: 25.8 pg — ABNORMAL LOW (ref 26.0–34.0)
MCHC: 31.1 g/dL (ref 30.0–36.0)
MCV: 83.1 fL (ref 78.0–100.0)
PLATELETS: 136 10*3/uL — AB (ref 150–400)
RBC: 4.45 MIL/uL (ref 3.87–5.11)
RDW: 14.8 % (ref 11.5–15.5)
WBC: 5.9 10*3/uL (ref 4.0–10.5)

## 2016-10-22 LAB — PROTIME-INR
INR: 4.42 — AB
Prothrombin Time: 41.8 seconds — ABNORMAL HIGH (ref 11.4–15.2)

## 2016-10-22 LAB — GLUCOSE, CAPILLARY
GLUCOSE-CAPILLARY: 141 mg/dL — AB (ref 65–99)
GLUCOSE-CAPILLARY: 131 mg/dL — AB (ref 65–99)
GLUCOSE-CAPILLARY: 117 mg/dL — AB (ref 65–99)
GLUCOSE-CAPILLARY: 152 mg/dL — AB (ref 65–99)
Glucose-Capillary: 128 mg/dL — ABNORMAL HIGH (ref 65–99)
Glucose-Capillary: 143 mg/dL — ABNORMAL HIGH (ref 65–99)

## 2016-10-22 MED ORDER — MAGNESIUM SULFATE 2 GM/50ML IV SOLN
2.0000 g | Freq: Once | INTRAVENOUS | Status: AC
  Administered 2016-10-22: 2 g via INTRAVENOUS
  Filled 2016-10-22 (×2): qty 50

## 2016-10-22 MED ORDER — POTASSIUM CHLORIDE 10 MEQ/100ML IV SOLN
10.0000 meq | INTRAVENOUS | Status: AC
  Administered 2016-10-22 (×5): 10 meq via INTRAVENOUS
  Filled 2016-10-22 (×7): qty 100

## 2016-10-22 NOTE — Progress Notes (Signed)
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  Patient presents to the ED at [facility] with c/o ongoing abd pain and distention. Symptoms onset about 1 week ago. Initially thought it was UTI and got Macrobid from PCP. Went to [facility] 24 hours ago, CT scan and lab work showed large pelvic mass, was referred to Gyn/onc for follow up, although gyn onc notes in their note that mass appears stable and not really changed for several years now (despite its large size). Returns to [facility] today with worsening abd pain.   ED Course:Repeat CT shows SBO. Also has ascites that are increased from yesterday. Otherwise has unchanged AAA EVAR and pelvic mass.    Subjective   No new complaints   Assessment/Plan:     1. Small bowel obstruction- NG tube in place, continue IV fluids. General surgery following. IV Zosyn discontinued 2. Hypokalemia-potassium is still low at 2.8. Potassium was replaced in the morning Follow BMP 3. Hypomagnesemia-magnesium was 1.5, Which was replaced. Will check magnesium level in a.m. 4. Acute kidney injury-creatinine improved with IV fluids. Today creatinine is 1.23. Renal ultrasound showed Dr. distention. Foley catheter placed. Hold Cozaar. Follow BMP in a.m. 5. Cystic pelvic mass- unchanged 6 mm cystic pelvic mass. Followed by OB/GYN as outpatient. 6. Lactic acidosis-resolved, repeat lactate 1.1  On 10/19/2016 will discontinue IV Zosyn. Blood cultures and urine cultures are negative so far 7. Atrial fibrillation- Coumadin is on hold. Continue IV metoprolol 2.5 mg every 6 hours. INR 4.88. 8. Diabetes mellitus-continue sliding scale insulin with NovoLog. Blood glucose controlled. 9. Hypertension- blood  pressure is stable.     DVT prophylaxis: SCDs,   Code Status: Full code  Family Communication: Discussed with patient's daughter at bedside   Disposition Plan: To be Determined   Consultants:  General surgery  Procedures:  None  Continuous infusions . dextrose 5 % and 0.9% NaCl 1,000 mL (10/22/16 3419)  . magnesium sulfate 1 - 4 g bolus IVPB        Antibiotics:   Anti-infectives    Start     Dose/Rate Route Frequency Ordered Stop   10/19/16 0600  piperacillin-tazobactam (ZOSYN) IVPB 2.25 g  Status:  Discontinued     2.25 g 100 mL/hr over 30 Minutes Intravenous Every 6 hours 10/18/16 2303 10/20/16 1106   10/18/16 2215  piperacillin-tazobactam (ZOSYN) IVPB 3.375 g     3.375 g 100 mL/hr over 30 Minutes Intravenous  Once 10/18/16 2214 10/18/16 2310       Objective   Vitals:   10/21/16 1409 10/21/16 2001 10/22/16 0559 10/22/16 1356  BP: 133/74 132/78 (!) 156/79 (!) 141/88  Pulse: (!) 115 (!) 102 (!) 109 (!) 110  Resp:  20 18 18   Temp: 98.5 F (36.9 C) 99 F (37.2 C) (!) 97.3 F (36.3 C) 100.2 F (37.9 C)  TempSrc: Oral Oral Oral Oral  SpO2: 93% 92% 94% 92%  Weight:      Height:        Intake/Output Summary (Last 24 hours) at 10/22/16 1623 Last data filed at 10/22/16 1503  Gross per 24 hour  Intake          4901.25 ml  Output  3125 ml  Net          1776.25 ml   Filed Weights   10/18/16 2121 10/19/16 0213  Weight: 74.8 kg (165 lb) 74.8 kg (165 lb)     Physical Examination:  Physical Exam: Eyes: No icterus, extraocular muscles intact  Mouth: Oral mucosa is moist, no lesions on palate,  Neck: Supple, no deformities, masses, or tenderness Lungs: Normal respiratory effort, bilateral clear to auscultation, no crackles or wheezes.  Heart: Regular rate and rhythm, S1 and S2 normal, no murmurs, rubs auscultated Abdomen: BS normoactive,soft,nondistended,non-tender to palpation,no organomegaly Extremities: No pretibial edema, no erythema,  no cyanosis, no clubbing Neuro : Alert and oriented to time, place and person, No focal deficits Skin: No rashes seen on exam      Data Reviewed: I have personally reviewed following labs and imaging studies  CBG:  Recent Labs Lab 10/21/16 1957 10/22/16 0004 10/22/16 0400 10/22/16 0805 10/22/16 1225  GLUCAP 121* 143* 128* 141* 131*    CBC:  Recent Labs Lab 10/18/16 2147 10/19/16 0357 10/20/16 0519 10/22/16 0506  WBC 9.6 8.1 3.2* 5.9  NEUTROABS 7.2  --   --   --   HGB 13.4 12.6 11.2* 11.5*  HCT 42.7 39.1 35.6* 37.0  MCV 81.8 81.0 81.7 83.1  PLT 124* 106* 114* 136*    Basic Metabolic Panel:  Recent Labs Lab 10/18/16 2147 10/19/16 0357 10/19/16 1405 10/20/16 0519 10/21/16 0530 10/22/16 0506  NA 131* 135  --  137 145 144  K 4.2 4.0  --  2.9* 3.0* 2.8*  CL 94* 101  --  104 113* 109  CO2 25 22  --  24 27 30   GLUCOSE 187* 139*  --  170* 165* 147*  BUN 39* 41*  --  36* 20 10  CREATININE 2.29* 1.99*  --  1.23* 0.87 0.71  CALCIUM 9.9 9.4  --  9.0 9.1 9.1  MG  --   --  1.6*  --  1.5*  --     Recent Results (from the past 240 hour(s))  Blood Culture (routine x 2)     Status: None (Preliminary result)   Collection Time: 10/18/16  9:40 PM  Result Value Ref Range Status   Specimen Description BLOOD RIGHT WRIST  Final   Special Requests   Final    BOTTLES DRAWN AEROBIC AND ANAEROBIC Blood Culture adequate volume   Culture   Final    NO GROWTH 3 DAYS Performed at [facility], [address], [place], [place] [address]    Report Status PENDING  Incomplete  Urine culture     Status: None   Collection Time: 10/18/16  9:47 PM  Result Value Ref Range Status   Specimen Description URINE, CATHETERIZED  Final   Special Requests NONE  Final   Culture   Final    NO GROWTH Performed at [facility], [address], [place], [place] [address]    Report Status 10/20/2016 FINAL  Final  Blood Culture (routine x 2)     Status: None (Preliminary result)    Collection Time: 10/18/16 10:40 PM  Result Value Ref Range Status   Specimen Description BLOOD RIGHT WRIST  Final   Special Requests IN PEDIATRIC BOTTLE Blood Culture adequate volume  Final   Culture   Final    NO GROWTH 3 DAYS Performed at [facility], [place] [address] [place],  [address]    Report Status PENDING  Incomplete  Liver Function Tests:  Recent Labs Lab 10/18/16 2147  AST 27  ALT 13*  ALKPHOS 62  BILITOT 1.1  PROT 6.9  ALBUMIN 3.4*   No results for input(s): LIPASE, AMYLASE in the last 168 hours. No results for input(s): AMMONIA in the last 168 hours.  Cardiac Enzymes: No results for input(s): CKTOTAL, CKMB, CKMBINDEX, TROPONINI in the last 168 hours. BNP (last 3 results) No results for input(s): BNP in the last 8760 hours.  ProBNP (last 3 results) No results for input(s): PROBNP in the last 8760 hours.    Studies: Dg Abd Portable 1v  Result Date: 10/22/2016 CLINICAL DATA:  Small-bowel obstruction EXAM: PORTABLE ABDOMEN - 1 VIEW COMPARISON:  Pain 07/2016 FINDINGS: Similar mild gas distention of the small bowel but there is air and contrast throughout the colon. Appearance more compatible with resolving obstruction or residual ileus. Diverticulosis noted. Aortic aneurysm endograft repair evident. NG tube in the stomach. Degenerative changes of the spine osteopenia. IMPRESSION: Similar gas distention of small bowel but with air and contrast in the colon, more compatible with resolving obstruction or mild ileus. Electronically Signed   By: Jerilynn Mages.  Shick M.D.   On: 10/22/2016 09:35   Dg Abd Portable 1v  Result Date: 10/21/2016 CLINICAL DATA:  Small bowel obstruction, followup EXAM: PORTABLE ABDOMEN - 1 VIEW COMPARISON:  Portable exam 1736 hours compared to 0455 hours FINDINGS: Tip of nasogastric tube projects over stomach. Aortoiliac stent. Retained contrast in colon, which appears decompressed. Distal colonic diverticulosis noted. Dilated small bowel  loops in mid abdomen, little changed. No definite bowel wall thickening. Bones demineralized. IMPRESSION: Persistent small while distension little changed from earlier study. Electronically Signed   By: Lavonia Dana M.D.   On: 10/21/2016 18:00   Dg Abd Portable 1v  Result Date: 10/21/2016 CLINICAL DATA:  Follow-up small bowel obstruction EXAM: PORTABLE ABDOMEN - 1 VIEW COMPARISON:  Abdominal radiograph of October 20, 2016 FINDINGS: There remain loops of moderately distended gas-filled small bowel in the mid and left abdomen. There is contrast within the colon from the ascending colon to the rectum. The esophagogastric tubes proximal port lies at or above the GE junction with the tip in the cardia. There is an aorto bi-iliac stent graft. IMPRESSION: Persistent small bowel obstruction with perhaps slightly decreased gaseous distention of small-bowel loops. No free extraluminal gas collections are observed. Advancement of the nasogastric tube by 10 cm is recommended to assure that the proximal port is positioned below the GE junction. Electronically Signed   By: David  Martinique M.D.   On: 10/21/2016 07:08    Scheduled Meds: . insulin aspart  0-9 Units Subcutaneous Q4H  . metoprolol tartrate  2.5 mg Intravenous Q6H      Time spent: 25 min  [facility] Pager [phone]. If 7PM-7AM, please contact night-coverage at [email or web address], Office  [phone]  password [place]  10/22/2016, 4:23 PM  LOS: 4 days

## 2016-10-22 NOTE — Progress Notes (Signed)
[facility] Progress Note     Subjective: CC:  Overall feels much better today and is more alert. Reports intermittent abdominal pain but states it is improved compared to 3 days ago. Having some, but minimal, flatus. 2 loose BMs yesterday s/p dulcolax suppository. Denies blood in stool. Ambulated in hall yesterday.   INR 4.42 today. Coumadin held K 2.8, CBC pending   Objective: Vital signs in last 24 hours: Temp:  [97.3 F (36.3 C)-99 F (37.2 C)] 97.3 F (36.3 C) (09/07 0559) Pulse Rate:  [99-115] 109 (09/07 0559) Resp:  [18-20] 18 (09/07 0559) BP: (126-156)/(73-79) 156/79 (09/07 0559) SpO2:  [92 %-97 %] 94 % (09/07 0559) Last BM Date: 10/21/16  Intake/Output from previous day: 09/06 0701 - 09/07 0700 In: 2890 [P.O.:20; I.V.:2500; NG/GT:120; IV Piggyback:250] Out: 3600 [Urine:2100; Emesis/NG output:1500] Intake/Output this shift: No intake/output data recorded.  PE: Gen: Alert, NAD, pleasant HEENT: pupils equal and round, EOMs in tact Card: Regular rate and rhythm, pedal pulses 2+ BL Pulm: Normal effort, clear to auscultation bilaterally Abd: Soft, non-tender, mild distention, bowel sounds present in all 4 quadrants, no hernias Skin: warm and dry, no rashes  Psych: A&Ox3    Lab Results:   Recent Labs  10/20/16 0519  WBC 3.2*  HGB 11.2*  HCT 35.6*  PLT 114*   BMET  Recent Labs  10/20/16 0519 10/21/16 0530  NA 137 145  K 2.9* 3.0*  CL 104 113*  CO2 24 27  GLUCOSE 170* 165*  BUN 36* 20  CREATININE 1.23* 0.87  CALCIUM 9.0 9.1   PT/INR  Recent Labs  10/21/16 0530 10/22/16 0514  LABPROT 40.0* 41.8*  INR 4.18* PENDING   CMP     Component Value Date/Time   NA 145 10/21/2016 0530   K 3.0 (L) 10/21/2016 0530   CL 113 (H) 10/21/2016 0530   CO2 27 10/21/2016 0530   GLUCOSE 165 (H) 10/21/2016 0530   BUN 20 10/21/2016 0530   CREATININE 0.87 10/21/2016 0530   CALCIUM 9.1 10/21/2016 0530   PROT 6.9 10/18/2016 2147   ALBUMIN 3.4  (L) 10/18/2016 2147   AST 27 10/18/2016 2147   ALT 13 (L) 10/18/2016 2147   ALKPHOS 62 10/18/2016 2147   BILITOT 1.1 10/18/2016 2147   GFRNONAA 58 (L) 10/21/2016 0530   GFRAA >60 10/21/2016 0530   Lipase  No results found for: LIPASE  Studies/Results: US Renal  Result Date: 10/20/2016 CLINICAL DATA:  Acute kidney injury. EXAM: RENAL / URINARY TRACT ULTRASOUND COMPLETE COMPARISON:  Abdomen and pelvis CT dated 10/18/2016. FINDINGS: Right Kidney: Length: 10.9 cm. Mildly echogenic. Diffuse parenchymal thinning and prominent renal sinus fat. No hydronephrosis. Left Kidney: Length: 11.9 cm. Mildly echogenic. Diffuse parenchymal thinning and prominent renal sinus fat. 2.2 cm mid renal cyst. There is also a 1.4 cm upper pole cyst with internal echoes. No hydronephrosis. Bladder: Dilated urinary bladder with a calculated volume of 1075 cc. Small inferior bladder diverticulum. IMPRESSION: 1. Dilated urinary bladder with a small inferior diverticulum. 2. Mildly echogenic kidneys compatible with medical renal disease. 3. Moderate bilateral renal parenchymal atrophy. 4. No hydronephrosis. Electronically Signed   By: Claudie Revering M.D.   On: 10/20/2016 11:46   Dg Abd Portable 1v  Result Date: 10/21/2016 CLINICAL DATA:  Small bowel obstruction, followup EXAM: PORTABLE ABDOMEN - 1 VIEW COMPARISON:  Portable exam 1736 hours compared to 0455 hours FINDINGS: Tip of nasogastric tube projects over stomach. Aortoiliac stent. Retained contrast in colon, which appears decompressed. Distal  colonic diverticulosis noted. Dilated small bowel loops in mid abdomen, little changed. No definite bowel wall thickening. Bones demineralized. IMPRESSION: Persistent small while distension little changed from earlier study. Electronically Signed   By: Lavonia Dana M.D.   On: 10/21/2016 18:00   Dg Abd Portable 1v  Result Date: 10/21/2016 CLINICAL DATA:  Follow-up small bowel obstruction EXAM: PORTABLE ABDOMEN - 1 VIEW COMPARISON:   Abdominal radiograph of October 20, 2016 FINDINGS: There remain loops of moderately distended gas-filled small bowel in the mid and left abdomen. There is contrast within the colon from the ascending colon to the rectum. The esophagogastric tubes proximal port lies at or above the GE junction with the tip in the cardia. There is an aorto bi-iliac stent graft. IMPRESSION: Persistent small bowel obstruction with perhaps slightly decreased gaseous distention of small-bowel loops. No free extraluminal gas collections are observed. Advancement of the nasogastric tube by 10 cm is recommended to assure that the proximal port is positioned below the GE junction. Electronically Signed   By: David  Martinique M.D.   On: 10/21/2016 07:08   Dg Abd Portable 1v  Result Date: 10/20/2016 CLINICAL DATA:  NG tube placement EXAM: PORTABLE ABDOMEN - 1 VIEW COMPARISON:  10/20/2016 FINDINGS: NG tube has been advanced slightly with the tip in the proximal stomach. The side-port is likely near the GE junction. Continued gaseous distention of bowel. IMPRESSION: NG tube slightly advanced with the tip in the proximal stomach. The side port appears to be near the GE junction. Electronically Signed   By: Rolm Baptise M.D.   On: 10/20/2016 11:36   Dg Abd Portable 1v  Result Date: 10/20/2016 CLINICAL DATA:  Small bowel obstruction, follow-up EXAM: PORTABLE ABDOMEN - 1 VIEW COMPARISON:  10/18/2016 abdominal radiograph FINDINGS: Aorto bi-iliac stent graft is in place. Enteric tube terminates at the esophagogastric junction. There are mildly to moderately dilated small bowel loops in the central abdomen bilaterally measuring up to the 4.9 cm diameter, decreased in the interval, previously 6.9 cm diameter. Retained oral contrast in the large bowel. Mild-to-moderate colonic gas and stool. No evidence of pneumatosis or pneumoperitoneum. No radiopaque urolithiasis. Stable hazy right lung base opacities. IMPRESSION: 1. Enteric tube terminates at the  esophagogastric junction, consider advancing 6-8 cm. 2. Persistent mild-to-moderate central small bowel dilatation, decreased in the interval, suggesting improving distal small bowel obstruction. These results will be called to the ordering clinician or representative by the Radiologist Assistant, and communication documented in the PACS or zVision Dashboard. Electronically Signed   By: Ilona Sorrel M.D.   On: 10/20/2016 08:34    Anti-infectives: Anti-infectives    Start     Dose/Rate Route Frequency Ordered Stop   10/19/16 0600  piperacillin-tazobactam (ZOSYN) IVPB 2.25 g  Status:  Discontinued     2.25 g 100 mL/hr over 30 Minutes Intravenous Every 6 hours 10/18/16 2303 10/20/16 1106   10/18/16 2215  piperacillin-tazobactam (ZOSYN) IVPB 3.375 g     3.375 g 100 mL/hr over 30 Minutes Intravenous  Once 10/18/16 2214 10/18/16 2310       Assessment/Plan SBO - suspect 2/2 to intra-abdominal adhesions, but possibly pelvic mass - SB protocol delay films show contrast in the colon. Repeat AXR today w/ small bowel distention but air in colon compatable with resolving SBO. - patient started having flatus 9/5. Loose BM 9/6. - patient with high NG output but bowel function returning. Will clamp NG tube and see how patient responds. Correct electrolytes to encourage motility. Continue NPO. Resume  NG to LIWS for significant nausea or emesis. - ambulate and OOB. - would likely benefit from a bowel regimen once tolerating PO in the setting of baseline constipation.   Hx Renal cell CA -s/p cryoablation small right renal cell cancer at [facility] end of 2016 AKI - foley care per primary team; suspect abnormal bladder scan (> 1,000cc) 2/2 known, large pelvic mass.  DM2  Pelvic mass - cystic, followed by Gyn/Onc, per notes stable/minimally enlarged compared to 2015 AAA s/p EVAR 2017 Afib on coumadin - coumadin held, supratherapeutic INR. Continue to hold. Bleeding or worsening obstructive sxs  that increase the likelihood of surgery may warrant FFP/Vit K.  FEN - hypokalemia (2.8) -replace with 6 runs IV KCl and give Mg.  ID - none VTE - SCD's Foley - per primary.    LOS: 4 days    Jill Alexanders , [facility] 10/22/2016, 7:23 AM Pager: [phone] Consults: [phone] Mon-Fri 7:00 am-4:30 pm Sat-Sun 7:00 am-11:30 am

## 2016-10-23 ENCOUNTER — Inpatient Hospital Stay (HOSPITAL_COMMUNITY): Payer: Medicare Other

## 2016-10-23 DIAGNOSIS — E876 Hypokalemia: Secondary | ICD-10-CM

## 2016-10-23 LAB — GLUCOSE, CAPILLARY
GLUCOSE-CAPILLARY: 114 mg/dL — AB (ref 65–99)
GLUCOSE-CAPILLARY: 142 mg/dL — AB (ref 65–99)
GLUCOSE-CAPILLARY: 144 mg/dL — AB (ref 65–99)
GLUCOSE-CAPILLARY: 168 mg/dL — AB (ref 65–99)
Glucose-Capillary: 154 mg/dL — ABNORMAL HIGH (ref 65–99)
Glucose-Capillary: 146 mg/dL — ABNORMAL HIGH (ref 65–99)

## 2016-10-23 LAB — COMPREHENSIVE METABOLIC PANEL
ALBUMIN: 2.5 g/dL — AB (ref 3.5–5.0)
ALK PHOS: 49 U/L (ref 38–126)
ALT: 15 U/L (ref 14–54)
AST: 18 U/L (ref 15–41)
Anion gap: 9 (ref 5–15)
BILIRUBIN TOTAL: 0.6 mg/dL (ref 0.3–1.2)
BUN: 6 mg/dL (ref 6–20)
CALCIUM: 8.8 mg/dL — AB (ref 8.9–10.3)
CO2: 30 mmol/L (ref 22–32)
Chloride: 103 mmol/L (ref 101–111)
Creatinine, Ser: 0.68 mg/dL (ref 0.44–1.00)
GLUCOSE: 143 mg/dL — AB (ref 65–99)
Potassium: 2.7 mmol/L — CL (ref 3.5–5.1)
Sodium: 142 mmol/L (ref 135–145)
TOTAL PROTEIN: 5.5 g/dL — AB (ref 6.5–8.1)

## 2016-10-23 LAB — PROTIME-INR
INR: 4.35
PROTHROMBIN TIME: 41.3 s — AB (ref 11.4–15.2)

## 2016-10-23 LAB — MAGNESIUM: Magnesium: 1.4 mg/dL — ABNORMAL LOW (ref 1.7–2.4)

## 2016-10-23 MED ORDER — POTASSIUM CHLORIDE 10 MEQ/100ML IV SOLN
10.0000 meq | INTRAVENOUS | Status: AC
  Administered 2016-10-23 (×6): 10 meq via INTRAVENOUS
  Filled 2016-10-23 (×6): qty 100

## 2016-10-23 MED ORDER — MAGNESIUM SULFATE 4 GM/100ML IV SOLN
4.0000 g | Freq: Once | INTRAVENOUS | Status: AC
  Administered 2016-10-23: 4 g via INTRAVENOUS
  Filled 2016-10-23: qty 100

## 2016-10-23 NOTE — Progress Notes (Signed)
[facility] Progress Note     Subjective: CC:  Overall feels much better today and is more alert. Reports intermittent abdominal pain but states it is improved compared to 3 days ago. Having some, but minimal, flatus. 2 loose BMs yesterday s/p dulcolax suppository. Denies blood in stool. Ambulated in hall yesterday.   Coumadin held   Objective: Vital signs in last 24 hours: Temp:  [99.5 F (37.5 C)-100.6 F (38.1 C)] 99.7 F (37.6 C) (09/08 0545) Pulse Rate:  [57-115] 57 (09/08 0545) Resp:  [18] 18 (09/08 0545) BP: (136-155)/(67-93) 136/92 (09/08 0545) SpO2:  [92 %-96 %] 93 % (09/08 0545) Last BM Date: 10/21/16  Intake/Output from previous day: 09/07 0701 - 09/08 0700 In: 3950 [I.V.:3500; IV Piggyback:450] Out: 3107 [Urine:2600; Emesis/NG output:507] Intake/Output this shift: No intake/output data recorded.  PE: Gen: Alert, NAD, pleasant HEENT: pupils equal and round, EOMs in tact Abd: Soft, non-tender, mild distention, no hernias Skin: warm and dry, no rashes  Psych: A&Ox3    Lab Results:   Recent Labs  10/22/16 0506  WBC 5.9  HGB 11.5*  HCT 37.0  PLT 136*   BMET  Recent Labs  10/22/16 0506 10/23/16 0640  NA 144 142  K 2.8* 2.7*  CL 109 103  CO2 30 30  GLUCOSE 147* 143*  BUN 10 6  CREATININE 0.71 0.68  CALCIUM 9.1 8.8*   PT/INR  Recent Labs  10/22/16 0514 10/23/16 0640  LABPROT 41.8* 41.3*  INR 4.42* PENDING   CMP     Component Value Date/Time   NA 142 10/23/2016 0640   K 2.7 (LL) 10/23/2016 0640   CL 103 10/23/2016 0640   CO2 30 10/23/2016 0640   GLUCOSE 143 (H) 10/23/2016 0640   BUN 6 10/23/2016 0640   CREATININE 0.68 10/23/2016 0640   CALCIUM 8.8 (L) 10/23/2016 0640   PROT 5.5 (L) 10/23/2016 0640   ALBUMIN 2.5 (L) 10/23/2016 0640   AST 18 10/23/2016 0640   ALT 15 10/23/2016 0640   ALKPHOS 49 10/23/2016 0640   BILITOT 0.6 10/23/2016 0640   GFRNONAA >60 10/23/2016 0640   GFRAA >60 10/23/2016 0640   Lipase  No  results found for: LIPASE  Studies/Results: Dg Abd Portable 1v  Result Date: 10/22/2016 CLINICAL DATA:  Small-bowel obstruction EXAM: PORTABLE ABDOMEN - 1 VIEW COMPARISON:  Pain 07/2016 FINDINGS: Similar mild gas distention of the small bowel but there is air and contrast throughout the colon. Appearance more compatible with resolving obstruction or residual ileus. Diverticulosis noted. Aortic aneurysm endograft repair evident. NG tube in the stomach. Degenerative changes of the spine osteopenia. IMPRESSION: Similar gas distention of small bowel but with air and contrast in the colon, more compatible with resolving obstruction or mild ileus. Electronically Signed   By: Jerilynn Mages.  Shick M.D.   On: 10/22/2016 09:35   Dg Abd Portable 1v  Result Date: 10/21/2016 CLINICAL DATA:  Small bowel obstruction, followup EXAM: PORTABLE ABDOMEN - 1 VIEW COMPARISON:  Portable exam 1736 hours compared to 0455 hours FINDINGS: Tip of nasogastric tube projects over stomach. Aortoiliac stent. Retained contrast in colon, which appears decompressed. Distal colonic diverticulosis noted. Dilated small bowel loops in mid abdomen, little changed. No definite bowel wall thickening. Bones demineralized. IMPRESSION: Persistent small while distension little changed from earlier study. Electronically Signed   By: Lavonia Dana M.D.   On: 10/21/2016 18:00    Anti-infectives: Anti-infectives    Start     Dose/Rate Route Frequency Ordered Stop   10/19/16  0600  piperacillin-tazobactam (ZOSYN) IVPB 2.25 g  Status:  Discontinued     2.25 g 100 mL/hr over 30 Minutes Intravenous Every 6 hours 10/18/16 2303 10/20/16 1106   10/18/16 2215  piperacillin-tazobactam (ZOSYN) IVPB 3.375 g     3.375 g 100 mL/hr over 30 Minutes Intravenous  Once 10/18/16 2214 10/18/16 2310       Assessment/Plan SBO - suspect 2/2 to intra-abdominal adhesions, but possibly pelvic mass - SB protocol delay films show contrast in the colon. Repeat AXR today w/ small  bowel distention but air in colon compatable with resolving SBO. - patient started having flatus 9/5. Loose BM 9/6. - patient with high NG output but bowel function returning. Appears to have tolerated clamping yesterday. Correct electrolytes to encourage motility. Continue NPO.  Wii recheck AXR this AM - ambulate and OOB. - would likely benefit from a bowel regimen once tolerating PO in the setting of baseline constipation.   Hx Renal cell CA -s/p cryoablation small right renal cell cancer at [facility] end of 2016 AKI - foley care per primary team; suspect abnormal bladder scan (> 1,000cc) 2/2 known, large pelvic mass.  DM2  Pelvic mass - cystic, followed by Gyn/Onc, per notes stable/minimally enlarged compared to 2015 AAA s/p EVAR 2017 Afib on coumadin - coumadin held, supratherapeutic INR. Continue to hold. Bleeding or worsening obstructive sxs that increase the likelihood of surgery may warrant FFP/Vit K.  FEN - hypokalemia (2.8) -potassium replaced with 60 meq IV ID - none VTE - SCD's Foley - per primary.    LOS: 5 days    Rosario Adie, MD  [facility]

## 2016-10-23 NOTE — Progress Notes (Signed)
CRITICAL VALUE ALERT  Critical Value: k+ 2.7  Date & Time Notied: 10/23/2016 0397  Provider Notified: [place] Orders Received/Actions taken:  K+  ordered

## 2016-10-23 NOTE — Progress Notes (Signed)
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  Patient presents to the ED at [facility] with c/o ongoing abd pain and distention. Symptoms onset about 1 week ago. Initially thought it was UTI and got Macrobid from PCP. Went to [facility] 24 hours ago, CT scan and lab work showed large pelvic mass, was referred to Gyn/onc for follow up, although gyn onc notes in their note that mass appears stable and not really changed for several years now (despite its large size). Returns to [facility] today with worsening abd pain.   ED Course:Repeat CT shows SBO. Also has ascites that are increased from yesterday. Otherwise has unchanged AAA EVAR and pelvic mass.    Subjective   Patient feels much better today. She had 2 loose BM yesterday.   Assessment/Plan:     1. Small bowel obstruction- NG tube in place, continue IV fluids. General surgery following. IV Zosyn discontinued 2. Hypokalemia-potassium is still low at 2.8. Potassium Is being replaced.Follow BMP 3. Hypomagnesemia-magnesium is 1.4. Replace magnesium and check myoglobin a.m. 4. Acute kidney injury-creatinine improved with IV fluids. Today creatinine is 1.23. Renal ultrasound showed Dr. distention. Foley catheter placed. Hold Cozaar. Follow BMP in a.m. 5. Cystic pelvic mass- unchanged 6 mm cystic pelvic mass. Followed by OB/GYN as outpatient. 6. Lactic acidosis-resolved, repeat lactate 1.1  On 10/19/2016 will discontinue IV Zosyn. Blood cultures and urine cultures are negative so far 7. Atrial fibrillation- Coumadin is on hold. Continue IV metoprolol 2.5 mg every 6 hours. INR 4.35. 8. Diabetes mellitus-continue sliding scale insulin with NovoLog. Blood glucose  controlled. 9. Hypertension- blood pressure is stable.     DVT prophylaxis: SCDs,   Code Status: Full code  Family Communication: Discussed with patient's daughter at bedside   Disposition Plan: To be Determined   Consultants:  General surgery  Procedures:  None  Continuous infusions . dextrose 5 % and 0.9% NaCl 125 mL/hr at 10/23/16 0630  . potassium chloride 10 mEq (10/23/16 1412)      Antibiotics:   Anti-infectives    Start     Dose/Rate Route Frequency Ordered Stop   10/19/16 0600  piperacillin-tazobactam (ZOSYN) IVPB 2.25 g  Status:  Discontinued     2.25 g 100 mL/hr over 30 Minutes Intravenous Every 6 hours 10/18/16 2303 10/20/16 1106   10/18/16 2215  piperacillin-tazobactam (ZOSYN) IVPB 3.375 g     3.375 g 100 mL/hr over 30 Minutes Intravenous  Once 10/18/16 2214 10/18/16 2310       Objective   Vitals:   10/22/16 1719 10/22/16 2150 10/23/16 0003 10/23/16 0545  BP: (!) 154/67 (!) 144/93 (!) 155/78 (!) 136/92  Pulse: (!) 115 99 (!) 102 (!) 57  Resp:  18  18  Temp:  (!) 100.6 F (38.1 C) 99.5 F (37.5 C) 99.7 F (37.6 C)  TempSrc:  Oral Oral Oral  SpO2: 96% 93%  93%  Weight:      Height:        Intake/Output Summary (Last 24 hours) at 10/23/16 1418 Last data filed at 10/23/16 0940  Gross per 24 hour  Intake             3950 ml  Output  2257 ml  Net             1693 ml   Filed Weights   10/18/16 2121 10/19/16 0213  Weight: 74.8 kg (165 lb) 74.8 kg (165 lb)     Physical Examination:  Physical Exam: Eyes: No icterus, extraocular muscles intact  Mouth: Oral mucosa is moist, no lesions on palate,  Neck: Supple, no deformities, masses, or tenderness Lungs: Normal respiratory effort, bilateral clear to auscultation, no crackles or wheezes.  Heart: Regular rate and rhythm, S1 and S2 normal, no murmurs, rubs auscultated Abdomen: BS Hypoactive, distended, nontender to palpation. NG tube in place Extremities: No pretibial edema,  no erythema, no cyanosis, no clubbing Neuro : Alert and oriented to time, place and person, No focal deficits Skin: No rashes seen on exam     Data Reviewed: I have personally reviewed following labs and imaging studies  CBG:  Recent Labs Lab 10/22/16 1942 10/23/16 0009 10/23/16 0419 10/23/16 0754 10/23/16 1220  GLUCAP 152* 114* 154* 144* 146*    CBC:  Recent Labs Lab 10/18/16 2147 10/19/16 0357 10/20/16 0519 10/22/16 0506  WBC 9.6 8.1 3.2* 5.9  NEUTROABS 7.2  --   --   --   HGB 13.4 12.6 11.2* 11.5*  HCT 42.7 39.1 35.6* 37.0  MCV 81.8 81.0 81.7 83.1  PLT 124* 106* 114* 136*    Basic Metabolic Panel:  Recent Labs Lab 10/19/16 0357 10/19/16 1405 10/20/16 0519 10/21/16 0530 10/22/16 0506 10/23/16 0640  NA 135  --  137 145 144 142  K 4.0  --  2.9* 3.0* 2.8* 2.7*  CL 101  --  104 113* 109 103  CO2 22  --  24 27 30 30   GLUCOSE 139*  --  170* 165* 147* 143*  BUN 41*  --  36* 20 10 6   CREATININE 1.99*  --  1.23* 0.87 0.71 0.68  CALCIUM 9.4  --  9.0 9.1 9.1 8.8*  MG  --  1.6*  --  1.5*  --  1.4*    Recent Results (from the past 240 hour(s))  Blood Culture (routine x 2)     Status: None (Preliminary result)   Collection Time: 10/18/16  9:40 PM  Result Value Ref Range Status   Specimen Description BLOOD RIGHT WRIST  Final   Special Requests   Final    BOTTLES DRAWN AEROBIC AND ANAEROBIC Blood Culture adequate volume   Culture   Final    NO GROWTH 4 DAYS Performed at [facility], [address] [place], [place] [address]    Report Status PENDING  Incomplete  Urine culture     Status: None   Collection Time: 10/18/16  9:47 PM  Result Value Ref Range Status   Specimen Description URINE, CATHETERIZED  Final   Special Requests NONE  Final   Culture   Final    NO GROWTH Performed at [facility], [address], [place], [place] [address]    Report Status 10/20/2016 FINAL  Final  Blood Culture (routine x 2)     Status: None (Preliminary  result)   Collection Time: 10/18/16 10:40 PM  Result Value Ref Range Status   Specimen Description BLOOD RIGHT WRIST  Final   Special Requests IN PEDIATRIC BOTTLE Blood Culture adequate volume  Final   Culture   Final    NO GROWTH 4 DAYS Performed at [facility], [place] [address] [place], [place] [address]  Report Status PENDING  Incomplete     Liver Function Tests:  Recent Labs Lab 10/18/16 2147 10/23/16 0640  AST 27 18  ALT 13* 15  ALKPHOS 62 49  BILITOT 1.1 0.6  PROT 6.9 5.5*  ALBUMIN 3.4* 2.5*   No results for input(s): LIPASE, AMYLASE in the last 168 hours. No results for input(s): AMMONIA in the last 168 hours.  Cardiac Enzymes: No results for input(s): CKTOTAL, CKMB, CKMBINDEX, TROPONINI in the last 168 hours. BNP (last 3 results) No results for input(s): BNP in the last 8760 hours.  ProBNP (last 3 results) No results for input(s): PROBNP in the last 8760 hours.    Studies: Dg Abd Portable 1v  Result Date: 10/22/2016 CLINICAL DATA:  Small-bowel obstruction EXAM: PORTABLE ABDOMEN - 1 VIEW COMPARISON:  Pain 07/2016 FINDINGS: Similar mild gas distention of the small bowel but there is air and contrast throughout the colon. Appearance more compatible with resolving obstruction or residual ileus. Diverticulosis noted. Aortic aneurysm endograft repair evident. NG tube in the stomach. Degenerative changes of the spine osteopenia. IMPRESSION: Similar gas distention of small bowel but with air and contrast in the colon, more compatible with resolving obstruction or mild ileus. Electronically Signed   By: Jerilynn Mages.  Shick M.D.   On: 10/22/2016 09:35   Dg Abd Portable 1v  Result Date: 10/21/2016 CLINICAL DATA:  Small bowel obstruction, followup EXAM: PORTABLE ABDOMEN - 1 VIEW COMPARISON:  Portable exam 1736 hours compared to 0455 hours FINDINGS: Tip of nasogastric tube projects over stomach. Aortoiliac stent. Retained contrast in colon, which appears decompressed. Distal  colonic diverticulosis noted. Dilated small bowel loops in mid abdomen, little changed. No definite bowel wall thickening. Bones demineralized. IMPRESSION: Persistent small while distension little changed from earlier study. Electronically Signed   By: Lavonia Dana M.D.   On: 10/21/2016 18:00   Dg Abd Portable 2v  Result Date: 10/23/2016 CLINICAL DATA:  Nausea, vomiting, abdominal pain. EXAM: PORTABLE ABDOMEN - 2 VIEW COMPARISON:  None. FINDINGS: Nasogastric tube with the tip projecting over the stomach, but recommend advancing the tube 8 cm. The bowel gas pattern is normal. There is oral contrast material in the colon. There is no evidence of free air. No radio-opaque calculi or other significant radiographic abnormality is seen. IMPRESSION: 1. Nasogastric tube with the tip projecting over the stomach, but recommend advancing the tube 8 cm. 2. No evidence bowel obstruction. Small amount contrast in the colon with mild gaseous distension of small bowel and colon likely reflecting resolving ileus. Electronically Signed   By: Kathreen Devoid   On: 10/23/2016 10:02    Scheduled Meds: . insulin aspart  0-9 Units Subcutaneous Q4H  . metoprolol tartrate  2.5 mg Intravenous Q6H      Time spent: 25 min  [facility] Pager [phone]. If 7PM-7AM, please contact night-coverage at [email or web address], Office  [phone]  password [email or web address]  10/23/2016, 2:18 PM  LOS: 5 days

## 2016-10-24 ENCOUNTER — Encounter (HOSPITAL_COMMUNITY): Payer: Self-pay

## 2016-10-24 LAB — BASIC METABOLIC PANEL
ANION GAP: 10 (ref 5–15)
BUN: 8 mg/dL (ref 6–20)
CO2: 30 mmol/L (ref 22–32)
Calcium: 8.6 mg/dL — ABNORMAL LOW (ref 8.9–10.3)
Chloride: 102 mmol/L (ref 101–111)
Creatinine, Ser: 0.71 mg/dL (ref 0.44–1.00)
GFR calc Af Amer: >60 mL/min (ref >60)
GFR calc non Af Amer: >60 mL/min (ref >60)
Glucose, Bld: 175 mg/dL — ABNORMAL HIGH (ref 65–99)
POTASSIUM: 2.8 mmol/L — AB (ref 3.5–5.1)
Sodium: 142 mmol/L (ref 135–145)

## 2016-10-24 LAB — CULTURE, BLOOD (ROUTINE X 2)
CULTURE: NO GROWTH
Culture: NO GROWTH
Special Requests: ADEQUATE
Special Requests: ADEQUATE

## 2016-10-24 LAB — PROTIME-INR
INR: 3.8
PROTHROMBIN TIME: 37.2 s — AB (ref 11.4–15.2)

## 2016-10-24 LAB — GLUCOSE, CAPILLARY
GLUCOSE-CAPILLARY: 160 mg/dL — AB (ref 65–99)
GLUCOSE-CAPILLARY: 165 mg/dL — AB (ref 65–99)
GLUCOSE-CAPILLARY: 186 mg/dL — AB (ref 65–99)
GLUCOSE-CAPILLARY: 193 mg/dL — AB (ref 65–99)
Glucose-Capillary: 122 mg/dL — ABNORMAL HIGH (ref 65–99)
Glucose-Capillary: 139 mg/dL — ABNORMAL HIGH (ref 65–99)
Glucose-Capillary: 173 mg/dL — ABNORMAL HIGH (ref 65–99)

## 2016-10-24 LAB — MAGNESIUM: Magnesium: 1.5 mg/dL — ABNORMAL LOW (ref 1.7–2.4)

## 2016-10-24 MED ORDER — METHYLPREDNISOLONE SODIUM SUCC 40 MG IJ SOLR
40.0000 mg | INTRAMUSCULAR | Status: DC
  Administered 2016-10-24 – 2016-10-25 (×2): 40 mg via INTRAVENOUS
  Filled 2016-10-24 (×3): qty 1

## 2016-10-24 MED ORDER — POTASSIUM CHLORIDE 10 MEQ/100ML IV SOLN
10.0000 meq | INTRAVENOUS | Status: AC
  Administered 2016-10-24 (×3): 10 meq via INTRAVENOUS
  Filled 2016-10-24 (×3): qty 100

## 2016-10-24 MED ORDER — MAGNESIUM SULFATE 4 GM/100ML IV SOLN
4.0000 g | Freq: Once | INTRAVENOUS | Status: AC
Start: 2016-10-24 — End: 2016-10-24
  Administered 2016-10-24: 4 g via INTRAVENOUS
  Filled 2016-10-24: qty 100

## 2016-10-24 NOTE — Progress Notes (Signed)
Pt pulled out ng tube. Ng tube only put out 100cc during the night and pt had 3 BMs. MD notfied. Order to leave ng tube out for now.

## 2016-10-24 NOTE — Progress Notes (Signed)
Dr. Darrick Meigs at bedside to assess pt and check labs. See new orders entered by MD. Pt to be transferred to [place]. Daughter aware at bedside.

## 2016-10-24 NOTE — Progress Notes (Signed)
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  Patient presents to the ED at [facility] with c/o ongoing abd pain and distention. Symptoms onset about 1 week ago. Initially thought it was UTI and got Macrobid from PCP. Went to [facility] 24 hours ago, CT scan and lab work showed large pelvic mass, was referred to Gyn/onc for follow up, although gyn onc notes in their note that mass appears stable and not really changed for several years now (despite its large size). Returns to [facility] today with worsening abd pain.   ED Course:Repeat CT shows SBO. Also has ascites that are increased from yesterday. Otherwise has unchanged AAA EVAR and pelvic mass.    Subjective   Patient went into A. Fib with RVR this morning. Potassium continues to be low   Assessment/Plan:     1. Small bowel obstruction- NG tube in place, continue IV fluids. General surgery following. IV Zosyn discontinued 2. Hypokalemia-potassium is still low at 2.8. Potassium Is being replaced.Follow BMP 3. Hypomagnesemia-magnesium is 1.5. Replace magnesium and check magnesium in am. 4. Acute kidney injury-creatinine improved with IV fluids. Today creatinine is 1.23. Renal ultrasound showed dilated urinary  distention. Foley catheter placed. Hold Cozaar. Follow BMP in a.m. 5. Cystic pelvic mass- unchanged 6 mm cystic pelvic mass. Followed by OB/GYN as outpatient. 6. Lactic acidosis-resolved, repeat lactate 1.1  On 10/19/2016 will discontinue IV Zosyn. Blood cultures and urine cultures are negative so far 7. Atrial fibrillation with RVR- Coumadin is on hold. Continue IV metoprolol 2.5 mg every 6 hours. INR 4.35. 8. Diabetes mellitus-continue sliding scale insulin with  NovoLog. Blood glucose controlled. 9. Hypertension- blood pressure is stable.     DVT prophylaxis: SCDs,   Code Status: Full code  Family Communication: Discussed with patient's daughter at bedside   Disposition Plan: To be Determined   Consultants:  General surgery  Procedures:  None  Continuous infusions . dextrose 5 % and 0.9% NaCl 125 mL/hr at 10/24/16 1338      Antibiotics:   Anti-infectives    Start     Dose/Rate Route Frequency Ordered Stop   10/19/16 0600  piperacillin-tazobactam (ZOSYN) IVPB 2.25 g  Status:  Discontinued     2.25 g 100 mL/hr over 30 Minutes Intravenous Every 6 hours 10/18/16 2303 10/20/16 1106   10/18/16 2215  piperacillin-tazobactam (ZOSYN) IVPB 3.375 g     3.375 g 100 mL/hr over 30 Minutes Intravenous  Once 10/18/16 2214 10/18/16 2310       Objective   Vitals:   10/24/16 0940 10/24/16 0945 10/24/16 1119 10/24/16 1236  BP: (!) 171/81 (!) 156/84 (!) 155/89 125/71  Pulse: 90 (!) 128 (!) 118 (!) 122  Resp: (!) 30 (!) 26  (!) 24  Temp:    99 F (37.2 C)  TempSrc:    Oral  SpO2: 92% 94%  93%  Weight:      Height:        Intake/Output Summary (Last 24 hours) at 10/24/16 1544 Last data filed at 10/24/16 1437  Gross per 24 hour  Intake          2872.92 ml  Output             2550 ml  Net  322.92 ml   Filed Weights   10/18/16 2121 10/19/16 0213  Weight: 74.8 kg (165 lb) 74.8 kg (165 lb)     Physical Examination:  Physical Exam: Eyes: No icterus, extraocular muscles intact  Mouth: Oral mucosa is moist, no lesions on palate,  Neck: Supple, no deformities, masses, or tenderness Lungs: Normal respiratory effort, bilateral clear to auscultation, no crackles or wheezes.  Heart: Irregular rhythm, S1 and S2 normal, no murmurs, rubs auscultated Abdomen: BS normoactive,soft,nondistended,non-tender to palpation,no organomegaly Extremities: No pretibial edema, no erythema, no cyanosis, no clubbing Neuro : Alert and  oriented to time, place and person, No focal deficits Skin: No rashes seen on exam    Data Reviewed: I have personally reviewed following labs and imaging studies  CBG:  Recent Labs Lab 10/23/16 1958 10/23/16 2331 10/24/16 0411 10/24/16 0837 10/24/16 1157  GLUCAP 168* 139* 160* 165* 122*    CBC:  Recent Labs Lab 10/18/16 2147 10/19/16 0357 10/20/16 0519 10/22/16 0506  WBC 9.6 8.1 3.2* 5.9  NEUTROABS 7.2  --   --   --   HGB 13.4 12.6 11.2* 11.5*  HCT 42.7 39.1 35.6* 37.0  MCV 81.8 81.0 81.7 83.1  PLT 124* 106* 114* 136*    Basic Metabolic Panel:  Recent Labs Lab 10/19/16 1405 10/20/16 0519 10/21/16 0530 10/22/16 0506 10/23/16 0640 10/24/16 0452  NA  --  137 145 144 142 142  K  --  2.9* 3.0* 2.8* 2.7* 2.8*  CL  --  104 113* 109 103 102  CO2  --  24 27 30 30 30   GLUCOSE  --  170* 165* 147* 143* 175*  BUN  --  36* 20 10 6 8   CREATININE  --  1.23* 0.87 0.71 0.68 0.71  CALCIUM  --  9.0 9.1 9.1 8.8* 8.6*  MG 1.6*  --  1.5*  --  1.4* 1.5*    Recent Results (from the past 240 hour(s))  Blood Culture (routine x 2)     Status: None   Collection Time: 10/18/16  9:40 PM  Result Value Ref Range Status   Specimen Description BLOOD RIGHT WRIST  Final   Special Requests   Final    BOTTLES DRAWN AEROBIC AND ANAEROBIC Blood Culture adequate volume   Culture   Final    NO GROWTH 5 DAYS Performed at [facility], [address] [place], [place] [address]    Report Status 10/24/2016 FINAL  Final  Urine culture     Status: None   Collection Time: 10/18/16  9:47 PM  Result Value Ref Range Status   Specimen Description URINE, CATHETERIZED  Final   Special Requests NONE  Final   Culture   Final    NO GROWTH Performed at [facility], [place] [address] [place], [place] [address]    Report Status 10/20/2016 FINAL  Final  Blood Culture (routine x 2)     Status: None   Collection Time: 10/18/16 10:40 PM  Result Value Ref Range Status   Specimen  Description BLOOD RIGHT WRIST  Final   Special Requests IN PEDIATRIC BOTTLE Blood Culture adequate volume  Final   Culture   Final    NO GROWTH 5 DAYS Performed at [facility], [place] [address] [place], [place] [address]    Report Status 10/24/2016 FINAL  Final     Liver Function Tests:  Recent Labs Lab 10/18/16 2147 10/23/16 0640  AST 27 18  ALT 13* 15  ALKPHOS 62 49  BILITOT 1.1 0.6  PROT 6.9 5.5*  ALBUMIN 3.4* 2.5*   No results for input(s): LIPASE, AMYLASE in the last 168 hours. No results for input(s): AMMONIA in the last 168 hours.  Cardiac Enzymes: No results for input(s): CKTOTAL, CKMB, CKMBINDEX, TROPONINI in the last 168 hours. BNP (last 3 results) No results for input(s): BNP in the last 8760 hours.  ProBNP (last 3 results) No results for input(s): PROBNP in the last 8760 hours.    Studies: Dg Abd Portable 2v  Result Date: 10/23/2016 CLINICAL DATA:  Nausea, vomiting, abdominal pain. EXAM: PORTABLE ABDOMEN - 2 VIEW COMPARISON:  None. FINDINGS: Nasogastric tube with the tip projecting over the stomach, but recommend advancing the tube 8 cm. The bowel gas pattern is normal. There is oral contrast material in the colon. There is no evidence of free air. No radio-opaque calculi or other significant radiographic abnormality is seen. IMPRESSION: 1. Nasogastric tube with the tip projecting over the stomach, but recommend advancing the tube 8 cm. 2. No evidence bowel obstruction. Small amount contrast in the colon with mild gaseous distension of small bowel and colon likely reflecting resolving ileus. Electronically Signed   By: Kathreen Devoid   On: 10/23/2016 10:02    Scheduled Meds: . insulin aspart  0-9 Units Subcutaneous Q4H  . methylPREDNISolone (SOLU-MEDROL) injection  40 mg Intravenous Q24H  . metoprolol tartrate  2.5 mg Intravenous Q6H      Time spent: 25 min  [facility] Pager [phone]. If 7PM-7AM, please contact night-coverage  at [email or web address], Office  [phone]  password [email or web address]  10/24/2016, 3:44 PM  LOS: 6 days

## 2016-10-24 NOTE — Progress Notes (Signed)
Pt remains stable with VS recorded, continues to deny chest pain, and no acute distress noted. Daughter at bedside calm, cooperative and encouraging to pt. Report called to Ify, RN on [place] regarding pt status and reason for transfer to telemetry. Pt transferred via low bed to 4th floor accompanied by RN, NT and daughter.

## 2016-10-24 NOTE — Progress Notes (Signed)
Dr. Darrick Meigs paged regarding pt status. BP stable yet heart rhythm irregular with fluctuating HR 70's into 130' with highest HR 139. Pt also tachyphemic at 28 resp/min and O2 sats in low 90's. No acute distress noted. Repositioned up in bed for increased comfort. Slight expiratory wheeze noted cleared with cough. No wheezes auscultated. Pt alert and oriented. Denied chest pain.

## 2016-10-24 NOTE — Progress Notes (Signed)
General Surgery [facility]  Assessment & Plan: Small bowel obstruction  Multiple BM's overnight  NG apparently removed during night  Will allow clear liquid diet this AM  would likely benefit from a bowel regimen once tolerating PO in the setting of baseline constipation Hypokalemia  Replete KCL  Per medical service Afib on coumadin  coumadin held, supratherapeutic INR. Continue to hold  RVR this AM - medicine to address Left ankle pain  Hx of Gout  Will ask medical service to evaluate and manage        Earnstine Regal, MD, [facility]       Office: [phone]    Chief Complaint: Small bowel obstruction  Subjective: Patient in bed.  No NG present this AM - apparently "came out" during the night.  Multiple BM's yesterday and last night.  Denies nausea or emesis this AM.  Family notes left ankle pain and hx of Gout.  Nursing notes difficulty with mobility.  Objective: Vital signs in last 24 hours: Temp:  [97.8 F (36.6 C)-99.8 F (37.7 C)] 99.4 F (37.4 C) (09/09 0920) Pulse Rate:  [65-145] 134 (09/09 0920) Resp:  [18-20] 20 (09/09 0920) BP: (89-157)/(73-95) 89/74 (09/09 0920) SpO2:  [88 %-93 %] 88 % (09/09 0920) Last BM Date: 10/23/16  Intake/Output from previous day: 09/08 0701 - 09/09 0700 In: 3700 [I.V.:3000; IV Piggyback:700] Out: 2150 [Urine:1700; Emesis/NG output:450] Intake/Output this shift: No intake/output data recorded.  Physical Exam: HEENT - sclerae clear, mucous membranes moist Neck - soft Chest - clear bilaterally Cor - irregular, tachycardic Abdomen - protuberant, few BS present; mild tenderness suprapubic  Lab Results:   Recent Labs  10/22/16 0506  WBC 5.9  HGB 11.5*  HCT 37.0  PLT 136*   BMET  Recent Labs  10/23/16 0640 10/24/16 0452  NA 142 142  K 2.7* 2.8*  CL 103 102  CO2 30 30  GLUCOSE 143* 175*  BUN 6 8  CREATININE 0.68 0.71  CALCIUM 8.8* 8.6*    PT/INR  Recent Labs  10/23/16 0640 10/24/16 0452  LABPROT 41.3* 37.2*  INR 4.35* 3.80   Comprehensive Metabolic Panel:    Component Value Date/Time   NA 142 10/24/2016 0452   NA 142 10/23/2016 0640   K 2.8 (L) 10/24/2016 0452   K 2.7 (LL) 10/23/2016 0640   CL 102 10/24/2016 0452   CL 103 10/23/2016 0640   CO2 30 10/24/2016 0452   CO2 30 10/23/2016 0640   BUN 8 10/24/2016 0452   BUN 6 10/23/2016 0640   CREATININE 0.71 10/24/2016 0452   CREATININE 0.68 10/23/2016 0640   GLUCOSE 175 (H) 10/24/2016 0452   GLUCOSE 143 (H) 10/23/2016 0640   CALCIUM 8.6 (L) 10/24/2016 0452   CALCIUM 8.8 (L) 10/23/2016 0640   AST 18 10/23/2016 0640   AST 27 10/18/2016 2147   ALT 15 10/23/2016 0640   ALT 13 (L) 10/18/2016 2147   ALKPHOS 49 10/23/2016 0640   ALKPHOS 62 10/18/2016 2147   BILITOT 0.6 10/23/2016 0640   BILITOT 1.1 10/18/2016 2147   PROT 5.5 (L) 10/23/2016 0640   PROT 6.9 10/18/2016 2147   ALBUMIN 2.5 (L) 10/23/2016 0640   ALBUMIN 3.4 (L) 10/18/2016 2147    Studies/Results: Dg Abd Portable 1v  Result Date: 10/22/2016 CLINICAL DATA:  Small-bowel obstruction EXAM: PORTABLE ABDOMEN - 1 VIEW COMPARISON:  Pain 07/2016 FINDINGS: Similar mild gas distention of the small bowel but  there is air and contrast throughout the colon. Appearance more compatible with resolving obstruction or residual ileus. Diverticulosis noted. Aortic aneurysm endograft repair evident. NG tube in the stomach. Degenerative changes of the spine osteopenia. IMPRESSION: Similar gas distention of small bowel but with air and contrast in the colon, more compatible with resolving obstruction or mild ileus. Electronically Signed   By: Jerilynn Mages.  Shick M.D.   On: 10/22/2016 09:35   Dg Abd Portable 2v  Result Date: 10/23/2016 CLINICAL DATA:  Nausea, vomiting, abdominal pain. EXAM: PORTABLE ABDOMEN - 2 VIEW COMPARISON:  None. FINDINGS: Nasogastric tube with the tip projecting over the stomach, but recommend advancing the tube 8  cm. The bowel gas pattern is normal. There is oral contrast material in the colon. There is no evidence of free air. No radio-opaque calculi or other significant radiographic abnormality is seen. IMPRESSION: 1. Nasogastric tube with the tip projecting over the stomach, but recommend advancing the tube 8 cm. 2. No evidence bowel obstruction. Small amount contrast in the colon with mild gaseous distension of small bowel and colon likely reflecting resolving ileus. Electronically Signed   By: Kathreen Devoid   On: 10/23/2016 10:02      [facility] M 10/24/2016  Patient ID: Sharmaine Base, female   DOB: 21-Oct-1927, 83 y.o.   MRN: [id number]

## 2016-10-25 ENCOUNTER — Encounter (HOSPITAL_COMMUNITY): Payer: Self-pay

## 2016-10-25 DIAGNOSIS — M10071 Idiopathic gout, right ankle and foot: Secondary | ICD-10-CM

## 2016-10-25 LAB — BASIC METABOLIC PANEL
Anion gap: 9 (ref 5–15)
BUN: 14 mg/dL (ref 6–20)
CALCIUM: 8.6 mg/dL — AB (ref 8.9–10.3)
CO2: 29 mmol/L (ref 22–32)
Chloride: 104 mmol/L (ref 101–111)
Creatinine, Ser: 0.69 mg/dL (ref 0.44–1.00)
GFR calc Af Amer: >60 mL/min (ref >60)
GLUCOSE: 123 mg/dL — AB (ref 65–99)
Potassium: 3.1 mmol/L — ABNORMAL LOW (ref 3.5–5.1)
Sodium: 142 mmol/L (ref 135–145)

## 2016-10-25 LAB — PROTIME-INR
INR: 4.56
Prothrombin Time: 42.9 seconds — ABNORMAL HIGH (ref 11.4–15.2)

## 2016-10-25 LAB — MAGNESIUM: Magnesium: 1.9 mg/dL (ref 1.7–2.4)

## 2016-10-25 LAB — GLUCOSE, CAPILLARY
GLUCOSE-CAPILLARY: 122 mg/dL — AB (ref 65–99)
GLUCOSE-CAPILLARY: 188 mg/dL — AB (ref 65–99)
Glucose-Capillary: 167 mg/dL — ABNORMAL HIGH (ref 65–99)
Glucose-Capillary: 136 mg/dL — ABNORMAL HIGH (ref 65–99)
Glucose-Capillary: 193 mg/dL — ABNORMAL HIGH (ref 65–99)

## 2016-10-25 MED ORDER — POTASSIUM CHLORIDE 10 MEQ/100ML IV SOLN
10.0000 meq | INTRAVENOUS | Status: AC
  Administered 2016-10-25 (×3): 10 meq via INTRAVENOUS
  Filled 2016-10-25 (×3): qty 100

## 2016-10-25 MED ORDER — BOOST / RESOURCE BREEZE PO LIQD
1.0000 | Freq: Three times a day (TID) | ORAL | Status: DC
  Administered 2016-10-25 (×2): 1 via ORAL
  Administered 2016-10-26: 10:00:00 via ORAL
  Administered 2016-10-26 – 2016-10-27 (×3): 1 via ORAL

## 2016-10-25 MED ORDER — IPRATROPIUM-ALBUTEROL 0.5-2.5 (3) MG/3ML IN SOLN: 3.0000 mL | RESPIRATORY_TRACT | Status: DC | PRN

## 2016-10-25 MED ORDER — POLYETHYLENE GLYCOL 3350 17 G PO PACK
17.0000 g | PACK | Freq: Every day | ORAL | Status: DC
  Administered 2016-10-25 – 2016-10-27 (×3): 17 g via ORAL
  Filled 2016-10-25 (×3): qty 1

## 2016-10-25 MED ORDER — FUROSEMIDE 10 MG/ML IJ SOLN
20.0000 mg | Freq: Once | INTRAMUSCULAR | Status: AC
  Administered 2016-10-25: 20 mg via INTRAVENOUS
  Filled 2016-10-25: qty 2

## 2016-10-25 MED ORDER — DILTIAZEM HCL 25 MG/5ML IV SOLN
15.0000 mg | Freq: Once | INTRAVENOUS | Status: AC
  Administered 2016-10-25: 15 mg via INTRAVENOUS
  Filled 2016-10-25: qty 5

## 2016-10-25 NOTE — Progress Notes (Signed)
[facility] Progress Note     Subjective: CC:   4-5 stools documented yesterday.  Objective: Vital signs in last 24 hours: Temp:  [98.1 F (36.7 C)-99 F (37.2 C)] 98.1 F (36.7 C) (09/10 0421) Pulse Rate:  [94-122] 113 (09/10 0421) Resp:  [19-24] 19 (09/10 0421) BP: (125-155)/(65-89) 130/65 (09/10 0421) SpO2:  [93 %-94 %] 93 % (09/10 0421) Last BM Date: 10/24/16  Intake/Output from previous day: 09/09 0701 - 09/10 0700 In: 3400 [I.V.:3000; IV Piggyback:400] Out: 1350 [Urine:1350] Intake/Output this shift: No intake/output data recorded.  PE: Gen:  Alert, NAD, pleasant Card:  Regular rate and rhythm, pedal pulses 2+ BL Pulm:  Normal effort, clear to auscultation bilaterally Abd: Soft, non-tender, non-distended, bowel sounds present in all 4 quadrants, no HSM, incisions C/D/I Skin: warm and dry, no rashes  Psych: A&Ox3   Lab Results:  No results for input(s): WBC, HGB, HCT, PLT in the last 72 hours. BMET  Recent Labs  10/23/16 0640 10/24/16 0452  NA 142 142  K 2.7* 2.8*  CL 103 102  CO2 30 30  GLUCOSE 143* 175*  BUN 6 8  CREATININE 0.68 0.71  CALCIUM 8.8* 8.6*   PT/INR  Recent Labs  10/24/16 0452 10/25/16 0416  LABPROT 37.2* 42.9*  INR 3.80 4.56*   CMP     Component Value Date/Time   NA 142 10/24/2016 0452   K 2.8 (L) 10/24/2016 0452   CL 102 10/24/2016 0452   CO2 30 10/24/2016 0452   GLUCOSE 175 (H) 10/24/2016 0452   BUN 8 10/24/2016 0452   CREATININE 0.71 10/24/2016 0452   CALCIUM 8.6 (L) 10/24/2016 0452   PROT 5.5 (L) 10/23/2016 0640   ALBUMIN 2.5 (L) 10/23/2016 0640   AST 18 10/23/2016 0640   ALT 15 10/23/2016 0640   ALKPHOS 49 10/23/2016 0640   BILITOT 0.6 10/23/2016 0640   GFRNONAA >60 10/24/2016 0452   GFRAA >60 10/24/2016 0452   Lipase  No results found for: LIPASE  Studies/Results: No results found.  Anti-infectives: Anti-infectives    Start     Dose/Rate Route Frequency Ordered Stop   10/19/16 0600   piperacillin-tazobactam (ZOSYN) IVPB 2.25 g  Status:  Discontinued     2.25 g 100 mL/hr over 30 Minutes Intravenous Every 6 hours 10/18/16 2303 10/20/16 1106   10/18/16 2215  piperacillin-tazobactam (ZOSYN) IVPB 3.375 g     3.375 g 100 mL/hr over 30 Minutes Intravenous  Once 10/18/16 2214 10/18/16 2310     Assessment/Plan Small bowel obstruction             -Multiple BM's starting evening of 9/8             -NG apparently removed during night 9/8             -Clear liquid diet              -Would likely benefit from a bowel regimen once tolerating PO in the setting of baseline constipation  Hypokalemia Afib on coumadin             coumadin held, supratherapeutic INR. Continue to hold             RVR being addressed by medicine Left ankle pain             Hx of Gout - per medical service.  FEN: clears; hypokalemia (BMET penidng; medicine giving KCl runs and 4 mg Mg) ID: none VTE: SCD's, coumadin held   Remove  Foley   LOS: 7 days    Jill Alexanders , [facility] 10/25/2016, 10:36 AM Pager: [phone] Consults: [phone] Mon-Fri 7:00 am-4:30 pm Sat-Sun 7:00 am-11:30 am

## 2016-10-25 NOTE — Progress Notes (Signed)
CRITICAL VALUE ALERT  Critical Value:  INR 4.56  Date & Time Notied:  10/25/16 @ 0522  Provider Notified: [place]  Orders Received/Actions taken:

## 2016-10-25 NOTE — Progress Notes (Signed)
Patient ID: Chelsea Dean, female   DOB: 10-21-1927, 83 y.o.   MRN: [id number] [facility] Progress Note:   * No surgery found *  Subjective: Mental status is awake but daughter did most of the talking Objective: Vital signs in last 24 hours: Temp:  [98.1 F (36.7 C)-98.9 F (37.2 C)] 98.1 F (36.7 C) (09/10 0421) Pulse Rate:  [94-114] 113 (09/10 0421) Resp:  [19-20] 19 (09/10 0421) BP: (130-141)/(65-78) 130/65 (09/10 0421) SpO2:  [93 %-94 %] 93 % (09/10 0421)  Intake/Output from previous day: 09/09 0701 - 09/10 0700 In: 3400 [I.V.:3000; IV Piggyback:400] Out: 1350 [Urine:1350] Intake/Output this shift: Total I/O In: 0  Out: 50 [Urine:50]  Physical Exam: Work of breathing is not labored.  Some flatus  Lab Results:  Results for orders placed or performed during the hospital encounter of 10/18/16 (from the past 48 hour(s))  Glucose, capillary     Status: Abnormal   Collection Time: 10/23/16  4:21 PM  Result Value Ref Range   Glucose-Capillary 142 (H) 65 - 99 mg/dL  Glucose, capillary     Status: Abnormal   Collection Time: 10/23/16  7:58 PM  Result Value Ref Range   Glucose-Capillary 168 (H) 65 - 99 mg/dL  Glucose, capillary     Status: Abnormal   Collection Time: 10/23/16 11:31 PM  Result Value Ref Range   Glucose-Capillary 139 (H) 65 - 99 mg/dL  Glucose, capillary     Status: Abnormal   Collection Time: 10/24/16  4:11 AM  Result Value Ref Range   Glucose-Capillary 160 (H) 65 - 99 mg/dL  Protime-INR     Status: Abnormal   Collection Time: 10/24/16  4:52 AM  Result Value Ref Range   Prothrombin Time 37.2 (H) 11.4 - 15.2 seconds   INR 0.86   Basic metabolic panel     Status: Abnormal   Collection Time: 10/24/16  4:52 AM  Result Value Ref Range   Sodium 142 135 - 145 mmol/L   Potassium 2.8 (L) 3.5 - 5.1 mmol/L   Chloride 102 101 - 111 mmol/L   CO2 30 22 - 32 mmol/L   Glucose, Bld 175 (H) 65 - 99 mg/dL   BUN 8 6 - 20 mg/dL   Creatinine, Ser 0.71 0.44 -  1.00 mg/dL   Calcium 8.6 (L) 8.9 - 10.3 mg/dL   GFR calc non Af Amer >60 >60 mL/min   GFR calc Af Amer >60 >60 mL/min    Comment: (NOTE) The eGFR has been calculated using the CKD EPI equation. This calculation has not been validated in all clinical situations. eGFR's persistently <60 mL/min signify possible Chronic Kidney Disease.    Anion gap 10 5 - 15  Magnesium     Status: Abnormal   Collection Time: 10/24/16  4:52 AM  Result Value Ref Range   Magnesium 1.5 (L) 1.7 - 2.4 mg/dL  Glucose, capillary     Status: Abnormal   Collection Time: 10/24/16  8:37 AM  Result Value Ref Range   Glucose-Capillary 165 (H) 65 - 99 mg/dL  Glucose, capillary     Status: Abnormal   Collection Time: 10/24/16 11:57 AM  Result Value Ref Range   Glucose-Capillary 122 (H) 65 - 99 mg/dL  Glucose, capillary     Status: Abnormal   Collection Time: 10/24/16  4:37 PM  Result Value Ref Range   Glucose-Capillary 186 (H) 65 - 99 mg/dL  Glucose, capillary     Status: Abnormal   Collection Time:  10/24/16  9:00 PM  Result Value Ref Range   Glucose-Capillary 193 (H) 65 - 99 mg/dL   Comment 1 Notify RN   Glucose, capillary     Status: Abnormal   Collection Time: 10/25/16 12:00 AM  Result Value Ref Range   Glucose-Capillary 173 (H) 65 - 99 mg/dL   Comment 1 Notify RN   Protime-INR     Status: Abnormal   Collection Time: 10/25/16  4:16 AM  Result Value Ref Range   Prothrombin Time 42.9 (H) 11.4 - 15.2 seconds   INR 4.56 (HH)     Comment: CRITICAL RESULT CALLED TO, READ BACK BY AND VERIFIED WITH: OBRYANT,M. RN @0521  ON 9.10.18 BY NMCCOY   Glucose, capillary     Status: Abnormal   Collection Time: 10/25/16  4:17 AM  Result Value Ref Range   Glucose-Capillary 167 (H) 65 - 99 mg/dL   Comment 1 Notify RN   Glucose, capillary     Status: Abnormal   Collection Time: 10/25/16  7:37 AM  Result Value Ref Range   Glucose-Capillary 122 (H) 65 - 99 mg/dL  Basic metabolic panel     Status: Abnormal   Collection  Time: 10/25/16  9:55 AM  Result Value Ref Range   Sodium 142 135 - 145 mmol/L   Potassium 3.1 (L) 3.5 - 5.1 mmol/L   Chloride 104 101 - 111 mmol/L   CO2 29 22 - 32 mmol/L   Glucose, Bld 123 (H) 65 - 99 mg/dL   BUN 14 6 - 20 mg/dL   Creatinine, Ser 0.69 0.44 - 1.00 mg/dL   Calcium 8.6 (L) 8.9 - 10.3 mg/dL   GFR calc non Af Amer >60 >60 mL/min   GFR calc Af Amer >60 >60 mL/min    Comment: (NOTE) The eGFR has been calculated using the CKD EPI equation. This calculation has not been validated in all clinical situations. eGFR's persistently <60 mL/min signify possible Chronic Kidney Disease.    Anion gap 9 5 - 15  Magnesium     Status: None   Collection Time: 10/25/16  9:55 AM  Result Value Ref Range   Magnesium 1.9 1.7 - 2.4 mg/dL  Glucose, capillary     Status: Abnormal   Collection Time: 10/25/16 11:57 AM  Result Value Ref Range   Glucose-Capillary 136 (H) 65 - 99 mg/dL    Radiology/Results: No results found.  Anti-infectives: Anti-infectives    Start     Dose/Rate Route Frequency Ordered Stop   10/19/16 0600  piperacillin-tazobactam (ZOSYN) IVPB 2.25 g  Status:  Discontinued     2.25 g 100 mL/hr over 30 Minutes Intravenous Every 6 hours 10/18/16 2303 10/20/16 1106   10/18/16 2215  piperacillin-tazobactam (ZOSYN) IVPB 3.375 g     3.375 g 100 mL/hr over 30 Minutes Intravenous  Once 10/18/16 2214 10/18/16 2310      Assessment/Plan: Problem List: Patient Active Problem List   Diagnosis Date Noted  . A-fib ([place]) 10/19/2016  . DM2 (diabetes mellitus, type 2) ([place]) 10/19/2016  . Pelvic mass 10/19/2016  . SBO (small bowel obstruction) ([place]) 10/18/2016  . AKI (acute kidney injury) ([place]) 10/18/2016  . Right renal mass     Hopeful resolution of ileus;  Foley to be removed * No surgery found *    LOS: 7 days   Matt B. Hassell Done, MD, [facility] [phone] beeper [phone]  10/25/2016 12:57 PM

## 2016-10-25 NOTE — Progress Notes (Signed)
Initial Nutrition Assessment  DOCUMENTATION CODES:   Obesity unspecified  INTERVENTION:  Monitor for diet advancement  Boost Breeze po TID, each supplement provides 250 kcal and 9 grams of protein  Monitor and supplement electrolytes as need per MD descretion.   NUTRITION DIAGNOSIS:   Inadequate oral intake related to inability to eat (SBO) as evidenced by meal completion < 25%.  GOAL:   Patient will meet greater than or equal to 90% of their needs  MONITOR:   PO intake, Supplement acceptance, Labs, Weight trends  REASON FOR ASSESSMENT:   NPO/Clear Liquid Diet   ASSESSMENT:   Pt with PMH of DM, diverticulosis, HLD, HTN, and ovarian cystic tumor, renal carcinoma s/p ablation in 2016. Presents this admission with c/o abd pain and distention. CT scan shows SBO.   Pt screened as NPO >5 days. Spoke with pt at bedside. Reports she is feeling much better. Had a small bowel movement 9/10 and is passing flatus. Reports having loss in appetite a few days prior to admission related to abdominal pain. Pt has not consumed anything PO in 7 days. Pt ordering on clear liquid menu upon visit and eager to eat. Will monitor for toleration and further diet advancement.   Pt reports no unintentional wt loss. Records limited in wt history but suspect wt has remained stable. Nutrition-Focused physical exam completed. Findings are no fat depletion, mild muscle depletion in temple and clavicle regions, and mild edema in BLE. RD to provide clear supplementation for increased protein needs.   Medications reviewed and include: SSI, solumedrol, NS with D5 10 ml/hr Labs reviewed: K 2.8 (L) CBG 175 (H) Mg 1.5 (L)  Diet Order:  Diet clear liquid Room service appropriate? Yes; Fluid consistency: Thin  Skin:  Reviewed, no issues  Last BM:  10/24/16  Height:   Ht Readings from Last 1 Encounters:  10/19/16 5\' 2"  (1.575 m)    Weight:   Wt Readings from Last 1 Encounters:  10/19/16 165 lb (74.8 kg)     Ideal Body Weight:  50 kg  BMI:  Body mass index is 30.18 kg/m.  Estimated Nutritional Needs:   Kcal:  1143 (MSJ)  Protein:  65-75 grams (1.3-1.5 g/kg IBW)  Fluid:  >1 L/day  EDUCATION NEEDS:   No education needs identified at this time  [place], LDN Clinical Nutrition Pager # - [phone]

## 2016-10-25 NOTE — Progress Notes (Signed)
Pt from home will continue to follow for discharge needs.

## 2016-10-25 NOTE — Progress Notes (Signed)
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  Patient presents to the ED at [facility] with c/o ongoing abd pain and distention. Symptoms onset about 1 week ago. Initially thought it was UTI and got Macrobid from PCP. Went to [facility] 24 hours ago, CT scan and lab work showed large pelvic mass, was referred to Gyn/onc for follow up, although gyn onc notes in their note that mass appears stable and not really changed for several years now (despite its large size). Returns to [facility] today with worsening abd pain.   ED Course:Repeat CT shows SBO. Also has ascites that are increased from yesterday. Otherwise has unchanged AAA EVAR and pelvic mass.    Subjective   Patient seen and examined, NG tube removed. Patient tolerating liquid diet well. Potassium continues to be low though has improved from yesterday.   Assessment/Plan:     1. Small bowel obstruction- NG tube Removed,, continue IV fluids. General surgery following. IV Zosyn discontinued patient is tolerating liquid diet well. 2. Hypokalemia-potassium is still low at 3.1. Potassium Is being replaced.Follow BMP 3. Hypomagnesemia-replete, magnesium is 1. 9.  4. Acute kidney injury-creatinine improved with IV fluids. Today creatinine is 1.23. Renal ultrasound showed urinary  distention. Foley catheter was placed. Will discontinue Foley catheter. 5. Gout-patient has flareup of gout, had pain with swelling in the right foot and right knee, was started on Solu-Medrol 40 mg IV every 12 hours. Significant improvement in swelling and pain. Will taper to prednisone in a.m. 6. Cystic pelvic mass- unchanged 6 mm cystic pelvic mass. Followed by OB/GYN as outpatient. 7. Lactic  acidosis-resolved, repeat lactate 1.1  the discontinue IV Zosyn. Blood cultures and urine cultures are negative so far 8. Atrial fibrillation with RVR- Coumadin is on hold. Continue IV metoprolol 2.5 mg every 6 hours. INR 4.35. 9. Diabetes mellitus-continue sliding scale insulin with NovoLog. Blood glucose controlled. 10. Hypertension- blood pressure is stable.     DVT prophylaxis: SCDs,   Code Status: Full code  Family Communication: Discussed with patient's daughter at bedside   Disposition Plan: To be Determined   Consultants:  General surgery  Procedures:  None  Continuous infusions . dextrose 5 % and 0.9% NaCl 10 mL/hr at 10/25/16 0614  . potassium chloride        Antibiotics:   Anti-infectives    Start     Dose/Rate Route Frequency Ordered Stop   10/19/16 0600  piperacillin-tazobactam (ZOSYN) IVPB 2.25 g  Status:  Discontinued     2.25 g 100 mL/hr over 30 Minutes Intravenous Every 6 hours 10/18/16 2303 10/20/16 1106   10/18/16 2215  piperacillin-tazobactam (ZOSYN) IVPB 3.375 g     3.375 g 100 mL/hr over 30 Minutes Intravenous  Once 10/18/16 2214 10/18/16 2310       Objective   Vitals:   10/24/16 1236 10/24/16 1700 10/24/16 2103 10/25/16 0421  BP: 125/71 (!) 141/78 139/76 130/65  Pulse: (!) 122 (!) 114 94 (!) 113  Resp: (!) 24  20 19   Temp: 99 F (37.2 C)  98.9 F (37.2 C) 98.1 F (36.7 C)  TempSrc: Oral  Oral Oral  SpO2: 93%  94% 93%  Weight:      Height:  Intake/Output Summary (Last 24 hours) at 10/25/16 1342 Last data filed at 10/25/16 0800  Gross per 24 hour  Intake             2600 ml  Output             1100 ml  Net             1500 ml   Filed Weights   10/18/16 2121 10/19/16 0213  Weight: 74.8 kg (165 lb) 74.8 kg (165 lb)     Physical Examination:  Physical Exam: Eyes: No icterus, extraocular muscles intact  Mouth: Oral mucosa is moist, no lesions on palate,  Neck: Supple, no deformities, masses, or tenderness Lungs:  Normal respiratory effort, bilateral clear to auscultation, no crackles or wheezes.  Heart: Regular rate and rhythm, S1 and S2 normal, no murmurs, rubs auscultated Abdomen: BS normoactive,soft,nondistended,non-tender to palpation,no organomegaly Extremities: Right foot-swelling, erythema, tenderness to palpation noted at first MTP joint. Right knee-edema, warmth, tender to palpation noted of right knee. Neuro : Alert and oriented to time, place and person, No focal deficits     Data Reviewed: I have personally reviewed following labs and imaging studies  CBG:  Recent Labs Lab 10/24/16 2100 10/25/16 0000 10/25/16 0417 10/25/16 0737 10/25/16 1157  GLUCAP 193* 173* 167* 122* 136*    CBC:  Recent Labs Lab 10/18/16 2147 10/19/16 0357 10/20/16 0519 10/22/16 0506  WBC 9.6 8.1 3.2* 5.9  NEUTROABS 7.2  --   --   --   HGB 13.4 12.6 11.2* 11.5*  HCT 42.7 39.1 35.6* 37.0  MCV 81.8 81.0 81.7 83.1  PLT 124* 106* 114* 136*    Basic Metabolic Panel:  Recent Labs Lab 10/19/16 1405  10/21/16 0530 10/22/16 0506 10/23/16 0640 10/24/16 0452 10/25/16 0955  NA  --   < > 145 144 142 142 142  K  --   < > 3.0* 2.8* 2.7* 2.8* 3.1*  CL  --   < > 113* 109 103 102 104  CO2  --   < > 27 30 30 30 29   GLUCOSE  --   < > 165* 147* 143* 175* 123*  BUN  --   < > 20 10 6 8 14   CREATININE  --   < > 0.87 0.71 0.68 0.71 0.69  CALCIUM  --   < > 9.1 9.1 8.8* 8.6* 8.6*  MG 1.6*  --  1.5*  --  1.4* 1.5* 1.9  < > = values in this interval not displayed.  Recent Results (from the past 240 hour(s))  Blood Culture (routine x 2)     Status: None   Collection Time: 10/18/16  9:40 PM  Result Value Ref Range Status   Specimen Description BLOOD RIGHT WRIST  Final   Special Requests   Final    BOTTLES DRAWN AEROBIC AND ANAEROBIC Blood Culture adequate volume   Culture   Final    NO GROWTH 5 DAYS Performed at [facility], [address] [place], [place] [address]    Report Status 10/24/2016  FINAL  Final  Urine culture     Status: None   Collection Time: 10/18/16  9:47 PM  Result Value Ref Range Status   Specimen Description URINE, CATHETERIZED  Final   Special Requests NONE  Final   Culture   Final    NO GROWTH Performed at [facility], [place] [address], [place], [place] [address]    Report Status 10/20/2016  FINAL  Final  Blood Culture (routine x 2)     Status: None   Collection Time: 10/18/16 10:40 PM  Result Value Ref Range Status   Specimen Description BLOOD RIGHT WRIST  Final   Special Requests IN PEDIATRIC BOTTLE Blood Culture adequate volume  Final   Culture   Final    NO GROWTH 5 DAYS Performed at [facility], [address], [place], [place] [address]    Report Status 10/24/2016 FINAL  Final     Liver Function Tests:  Recent Labs Lab 10/18/16 2147 10/23/16 0640  AST 27 18  ALT 13* 15  ALKPHOS 62 49  BILITOT 1.1 0.6  PROT 6.9 5.5*  ALBUMIN 3.4* 2.5*   No results for input(s): LIPASE, AMYLASE in the last 168 hours. No results for input(s): AMMONIA in the last 168 hours.  Cardiac Enzymes: No results for input(s): CKTOTAL, CKMB, CKMBINDEX, TROPONINI in the last 168 hours. BNP (last 3 results) No results for input(s): BNP in the last 8760 hours.  ProBNP (last 3 results) No results for input(s): PROBNP in the last 8760 hours.    Studies: No results found.  Scheduled Meds: . feeding supplement  1 Container Oral TID BM  . insulin aspart  0-9 Units Subcutaneous Q4H  . methylPREDNISolone (SOLU-MEDROL) injection  40 mg Intravenous Q24H  . metoprolol tartrate  2.5 mg Intravenous Q6H  . polyethylene glycol  17 g Oral Daily      Time spent: 25 min  [facility] Pager [phone]. If 7PM-7AM, please contact night-coverage at [email or web address], Office  [phone]  password [email or web address]  10/25/2016, 1:42 PM  LOS: 7 days

## 2016-10-26 LAB — PROTIME-INR
INR: 4.15 — AB
PROTHROMBIN TIME: 39.8 s — AB (ref 11.4–15.2)

## 2016-10-26 LAB — BASIC METABOLIC PANEL
Anion gap: 8 (ref 5–15)
BUN: 20 mg/dL (ref 6–20)
CO2: 28 mmol/L (ref 22–32)
Calcium: 8.6 mg/dL — ABNORMAL LOW (ref 8.9–10.3)
Chloride: 103 mmol/L (ref 101–111)
Creatinine, Ser: 0.79 mg/dL (ref 0.44–1.00)
GFR calc Af Amer: >60 mL/min (ref >60)
GFR calc non Af Amer: >60 mL/min (ref >60)
Glucose, Bld: 222 mg/dL — ABNORMAL HIGH (ref 65–99)
Potassium: 3.4 mmol/L — ABNORMAL LOW (ref 3.5–5.1)
Sodium: 139 mmol/L (ref 135–145)

## 2016-10-26 LAB — GLUCOSE, CAPILLARY
GLUCOSE-CAPILLARY: 161 mg/dL — AB (ref 65–99)
Glucose-Capillary: 131 mg/dL — ABNORMAL HIGH (ref 65–99)
Glucose-Capillary: 141 mg/dL — ABNORMAL HIGH (ref 65–99)
Glucose-Capillary: 178 mg/dL — ABNORMAL HIGH (ref 65–99)
Glucose-Capillary: 185 mg/dL — ABNORMAL HIGH (ref 65–99)
Glucose-Capillary: 220 mg/dL — ABNORMAL HIGH (ref 65–99)

## 2016-10-26 LAB — HEMOGLOBIN AND HEMATOCRIT, BLOOD
HCT: 32.3 % — ABNORMAL LOW (ref 36.0–46.0)
HEMOGLOBIN: 9.9 g/dL — AB (ref 12.0–15.0)

## 2016-10-26 MED ORDER — PREDNISONE 20 MG PO TABS
20.0000 mg | ORAL_TABLET | Freq: Every day | ORAL | Status: DC
  Administered 2016-10-26: 20 mg via ORAL
  Filled 2016-10-26: qty 1

## 2016-10-26 MED ORDER — WARFARIN - PHARMACIST DOSING INPATIENT: Freq: Every day | Status: DC

## 2016-10-26 MED ORDER — POTASSIUM CHLORIDE 2 MEQ/ML IV SOLN
INTRAVENOUS | Status: DC
  Administered 2016-10-26 – 2016-10-27 (×2): via INTRAVENOUS
  Filled 2016-10-26 (×3): qty 1000

## 2016-10-26 MED ORDER — METOPROLOL TARTRATE 50 MG PO TABS
100.0000 mg | ORAL_TABLET | Freq: Two times a day (BID) | ORAL | Status: DC
  Administered 2016-10-26 – 2016-10-27 (×3): 100 mg via ORAL
  Filled 2016-10-26 (×3): qty 2

## 2016-10-26 MED ORDER — PREDNISONE 20 MG PO TABS
30.0000 mg | ORAL_TABLET | Freq: Every day | ORAL | Status: AC
  Administered 2016-10-27: 30 mg via ORAL
  Filled 2016-10-26: qty 1

## 2016-10-26 MED ORDER — PREDNISONE 20 MG PO TABS: 20.0000 mg | ORAL_TABLET | Freq: Every day | ORAL | Status: DC

## 2016-10-26 MED ORDER — DILTIAZEM HCL ER COATED BEADS 120 MG PO CP24
120.0000 mg | ORAL_CAPSULE | Freq: Every day | ORAL | Status: DC
  Administered 2016-10-26 – 2016-10-27 (×2): 120 mg via ORAL
  Filled 2016-10-26 (×2): qty 1

## 2016-10-26 MED ORDER — PREDNISONE 10 MG PO TABS: 10.0000 mg | ORAL_TABLET | Freq: Every day | ORAL | Status: DC

## 2016-10-26 NOTE — Evaluation (Signed)
Physical Therapy Evaluation Patient Details Name: Chelsea Dean MRN: [id number] DOB: 1927/03/01 Today's Date: 10/26/2016   History of Present Illness  83 yo female admitted with SBO. Now also with R LE gout flare. Hx of Afib, AAA, HTN, DM, renal carcinoma.  Clinical Impression  On eval, pt required Min assist for mobility. She walked ~20 feet with a RW. R knee pain rated 8/10 with activity. Ambulation distance was limited by fatigue and pain. Pt presents with general weakness, decreased activity tolerance, and impaired gait and balance. Discussed d/c plan with pt and daughter. Pt lives with her husband who cannot provide any physical assistance. Unsure if pt can arrange to have 24 hour supervision/assistance at home (children work). Encouraged pt and family to discuss d/c plan. Recommended to pt that she consider a short stay at rehab prior to returning home.     Follow Up Recommendations SNF;Supervision/Assistance - 24 hour (HHPT, HHOT, [facility] if pt declines placement)    Equipment Recommendations  None recommended by PT    Recommendations for Other Services       Precautions / Restrictions Precautions Precautions: Fall Restrictions Weight Bearing Restrictions: No      Mobility  Bed Mobility               General bed mobility comments: oob in recliner  Transfers Overall transfer level: Needs assistance Equipment used: Rolling walker (2 wheeled) Transfers: Sit to/from Stand Sit to Stand: Min assist         General transfer comment: Assist to rise, stabilize, control descent. VCS safety, technique, hand/LE placement. Increased time. Task is effortful for pt.   Ambulation/Gait Ambulation/Gait assistance: Min assist;+2 safety/equipment Ambulation Distance (Feet): 20 Feet Assistive device: Rolling walker (2 wheeled) Gait Pattern/deviations: Step-to pattern;Antalgic;Decreased stance time - right     General Gait Details: VCs safety, sequence, technique,  distance from RW, step length. Assist to stabilize throughout distance. Daughter followed with recliner. Distance is limited by fatigue and pain  Stairs            Wheelchair Mobility    Modified Rankin (Stroke Patients Only)       Balance Overall balance assessment: Needs assistance         Standing balance support: Bilateral upper extremity supported Standing balance-Leahy Scale: Poor                               Pertinent Vitals/Pain Pain Assessment: 0-10 Pain Score: 8  Pain Location: R knee Pain Descriptors / Indicators: Sore;Aching Pain Intervention(s): Limited activity within patient's tolerance;Repositioned    Home Living Family/patient expects to be discharged to:: Private residence Living Arrangements: Spouse/significant other (children live close by) Available Help at Discharge: Family;Available PRN/intermittently Type of Home: House Home Access: Stairs to enter;Ramped entrance   Entrance Stairs-Number of Steps: 2 Home Layout: One level Home Equipment: Walker - 2 wheels;Cane - single point      Prior Function Level of Independence: Independent with assistive device(s)         Comments: cane for ambulation     Hand Dominance        Extremity/Trunk Assessment   Upper Extremity Assessment Upper Extremity Assessment: Generalized weakness    Lower Extremity Assessment Lower Extremity Assessment: Generalized weakness;RLE deficits/detail RLE Deficits / Details: swelling, painful    Cervical / Trunk Assessment Cervical / Trunk Assessment: Normal  Communication   Communication: No difficulties  Cognition Arousal/Alertness: Awake/alert Behavior During  Therapy: WFL for tasks assessed/performed Overall Cognitive Status: Within Functional Limits for tasks assessed                                        General Comments      Exercises     Assessment/Plan    PT Assessment Patient needs continued PT  services  PT Problem List Decreased strength;Decreased mobility;Decreased activity tolerance;Decreased knowledge of use of DME;Decreased balance;Pain       PT Treatment Interventions DME instruction;Gait training;Therapeutic activities;Patient/family education;Therapeutic exercise;Balance training;Functional mobility training    PT Goals (Current goals can be found in the Care Plan section)  Acute Rehab PT Goals Patient Stated Goal: home. less pain.  PT Goal Formulation: With patient/family Time For Goal Achievement: 11/09/16 Potential to Achieve Goals: Good    Frequency Min 3X/week   Barriers to discharge        Co-evaluation               AM-PAC PT "6 Clicks" Daily Activity  Outcome Measure Difficulty turning over in bed (including adjusting bedclothes, sheets and blankets)?: Unable Difficulty moving from lying on back to sitting on the side of the bed? : Unable Difficulty sitting down on and standing up from a chair with arms (e.g., wheelchair, bedside commode, etc,.)?: Unable Help needed moving to and from a bed to chair (including a wheelchair)?: A Little Help needed walking in hospital room?: A Little Help needed climbing 3-5 steps with a railing? : A Lot 6 Click Score: 11    End of Session Equipment Utilized During Treatment: Gait belt Activity Tolerance: Patient limited by fatigue;Patient limited by pain Patient left: in chair;with family/visitor present;with chair alarm set;with call bell/phone within reach   PT Visit Diagnosis: Muscle weakness (generalized) (M62.81);Difficulty in walking, not elsewhere classified (R26.2)    Time: 1610-9604 PT Time Calculation (min) (ACUTE ONLY): 13 min   Charges:   PT Evaluation $PT Eval Low Complexity: 1 Low     PT G Codes:          Weston Anna, MPT Pager: [phone]

## 2016-10-26 NOTE — Progress Notes (Signed)
[facility] Progress Note     Subjective: CC:  Denies abdominal pain. Tolerating clears. Had 2 BMs today. Denies nausea or vomiting. Had a PT eval today. They are recommending SNF. Per pt and nurse she has been urinating s/p foley removal.   Objective: Vital signs in last 24 hours: Temp:  [98.5 F (36.9 C)-98.8 F (37.1 C)] 98.8 F (37.1 C) (09/11 0500) Pulse Rate:  [110-145] 110 (09/11 1200) Resp:  [18] 18 (09/11 0500) BP: (120-141)/(55-98) 131/75 (09/11 1200) SpO2:  [90 %-93 %] 93 % (09/11 0500) Last BM Date: 10/25/16  Intake/Output from previous day: 09/10 0701 - 09/11 0700 In: 1266.8 [P.O.:840; I.V.:226.8; IV Piggyback:200] Out: 1700 [Urine:1700] Intake/Output this shift: Total I/O In: -  Out: 225 [Urine:225]  PE: Gen:  Alert, NAD, pleasant Card:  Irregular rhythm, pedal pulses palpable.  Pulm:  Normal effort, clear to auscultation bilaterally Abd: Soft, non-tender, non-distended, bowel sounds present in all 4 quadrants Skin: warm and dry, no rashes  Psych: A&Ox3   Lab Results:   Recent Labs  10/26/16 0703  HGB 9.9*  HCT 32.3*   BMET  Recent Labs  10/25/16 0955 10/26/16 0854  NA 142 139  K 3.1* 3.4*  CL 104 103  CO2 29 28  GLUCOSE 123* 222*  BUN 14 20  CREATININE 0.69 0.79  CALCIUM 8.6* 8.6*   PT/INR  Recent Labs  10/25/16 0416 10/26/16 0418  LABPROT 42.9* 39.8*  INR 4.56* 4.15*   CMP     Component Value Date/Time   NA 139 10/26/2016 0854   K 3.4 (L) 10/26/2016 0854   CL 103 10/26/2016 0854   CO2 28 10/26/2016 0854   GLUCOSE 222 (H) 10/26/2016 0854   BUN 20 10/26/2016 0854   CREATININE 0.79 10/26/2016 0854   CALCIUM 8.6 (L) 10/26/2016 0854   PROT 5.5 (L) 10/23/2016 0640   ALBUMIN 2.5 (L) 10/23/2016 0640   AST 18 10/23/2016 0640   ALT 15 10/23/2016 0640   ALKPHOS 49 10/23/2016 0640   BILITOT 0.6 10/23/2016 0640   GFRNONAA >60 10/26/2016 0854   GFRAA >60 10/26/2016 0854   Lipase  No results found for:  LIPASE  Studies/Results: No results found.  Anti-infectives: Anti-infectives    Start     Dose/Rate Route Frequency Ordered Stop   10/19/16 0600  piperacillin-tazobactam (ZOSYN) IVPB 2.25 g  Status:  Discontinued     2.25 g 100 mL/hr over 30 Minutes Intravenous Every 6 hours 10/18/16 2303 10/20/16 1106   10/18/16 2215  piperacillin-tazobactam (ZOSYN) IVPB 3.375 g     3.375 g 100 mL/hr over 30 Minutes Intravenous  Once 10/18/16 2214 10/18/16 2310     Assessment/Plan Small bowel obstruction -Multiple BM's starting evening of 9/8 -NG apparently removed during night 9/8 - advance diet to full liquids, then SOFT as tolerated -recommend continuation of daily miralax PRN at discharge to avoid constipation.   - stable for discharge to SNF or recommended facility from a surgical perspective once tolerated a SOFT diet.   LOS: 8 days    Jill Alexanders , [facility] Surgery 10/26/2016, 12:42 PM Pager: [phone] Consults: [phone] Mon-Fri 7:00 am-4:30 pm Sat-Sun 7:00 am-11:30 am

## 2016-10-26 NOTE — Progress Notes (Signed)
ANTICOAGULATION CONSULT NOTE - Initial Consult  Pharmacy Consult for warfarin Indication: atrial fibrillation  Allergies  Allergen Reactions  . Ace Inhibitors   . Contrast Media [Iodinated Diagnostic Agents]   . Nsaids    Patient Measurements: Height: 5\' 2"  (157.5 cm) Weight: 165 lb (74.8 kg) IBW/kg (Calculated) : 50.1  Vital Signs: Temp: 98.8 F (37.1 C) (09/11 0500) Temp Source: Oral (09/11 0500) BP: 131/75 (09/11 1200) Pulse Rate: 110 (09/11 1200)  Labs:  Recent Labs  10/24/16 0452 10/25/16 0416 10/25/16 0955 10/26/16 0418 10/26/16 0703 10/26/16 0854  HGB  --   --   --   --  9.9*  --   HCT  --   --   --   --  32.3*  --   LABPROT 37.2* 42.9*  --  39.8*  --   --   INR 3.80 4.56*  --  4.15*  --   --   CREATININE 0.71  --  0.69  --   --  0.79   Estimated Creatinine Clearance: 46.9 mL/min (by C-G formula based on SCr of 0.79 mg/dL).  Medical History: Past Medical History:  Diagnosis Date  . Anticoagulated on Coumadin   . Arthritis   . Diabetes mellitus without complication ([place])   . Diverticulosis   . Dysrhythmia    Atrial fib  . Hx of colonic polyps   . Hyperlipidemia   . Hypertension   . Right renal mass    Medications:  Scheduled:  . diltiazem  120 mg Oral Daily  . feeding supplement  1 Container Oral TID BM  . insulin aspart  0-9 Units Subcutaneous Q4H  . metoprolol tartrate  100 mg Oral BID  . polyethylene glycol  17 g Oral Daily  . [START ON 10/27/2016] predniSONE  30 mg Oral Q breakfast   Followed by  . [START ON 10/28/2016] predniSONE  20 mg Oral Q breakfast   Followed by  . [START ON 10/29/2016] predniSONE  10 mg Oral Q breakfast   Infusions:  . dextrose 5 % and 0.45% NaCl 1,000 mL with potassium chloride 20 mEq infusion 75 mL/hr at 10/26/16 1231    Assessment: 50 yoF with c/o abdominal pain x 8 days. Patient on warfarin for atrial fibrillation. Warfarin has been held since admission due to SBO. Pharmacy asked to resume warfarin now that  SBO resolving.   PTA Warfarin for Afib, home dose 5mg  daily, last dose 9/3  Today, 10/26/2016  INR SUPRAtherapeutic  Suspect from steroids and NPO status   CBC: Hgb = 9.9, pltc was decreased early this admission but was improving (last checked 9/7)  Drug-drug interactions: Steroids can increased warfarin's effect  Goal of Therapy:  INR 2-3 Monitor platelets by anticoagulation protocol: Yes   Plan:   No warfarin with INR > 4  Daily INR  Doreene Eland, PharmD, BCPS.   Pager: [phone] 10/26/2016 1:07 PM

## 2016-10-26 NOTE — Progress Notes (Signed)
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  Patient presents [facility] ED at [facility] with c/o ongoing abd pain and distention. Symptoms onset about 1 week ago. Initially thought it was UTI and got Macrobid from PCP. Went to [facility] 24 hours ago, CT scan and lab work showed large pelvic mass, was referred to Gyn/onc for follow up, although gyn onc notes in their note that mass appears stable and not really changed for several years now (despite its large size). Returns to [facility] today with worsening abd pain.  ED Course:Repeat CT shows SBO. Also has ascites that are increased from yesterday. Otherwise has unchanged AAA EVAR and pelvic mass.  During hospital stay patient had a prolonged episode of small bowel obstruction which is currently resolving. Patient is tolerating liquid diet.. NG tube was discontinued. She also had persistent hypokalemia and hypomagnesemia likely from loss of electrolytes from NG output. Slowly improving.   Subjective   Patient seen and examined, denies abdominal pain. Had bowel movement yesterday. Tolerating clear liquid diet.   Assessment/Plan:     1. Small bowel obstruction- Resolved, NG tube Removed,, continue IV fluids. General surgery following. IV Zosyn discontinued, patient is tolerating liquid diet well. 2. Hypokalemia-potassium is still low at 3.4. Will add potassium to IV fluids. Follow BMP in a.m. 3. Hypomagnesemia-replete, magnesium is 1. 9.  4. Acute kidney injury-creatinine improved with IV fluids. Today creatinine is 1.23. Renal ultrasound showed urinary  distention. Foley catheter was placed, which has been discontinued. Continue to hold Cozaar. 5. Gout-patient has flareup of  gout, had pain with swelling in the right foot and right knee, was started on Solu-Medrol 40 mg IV every 12 hours. Significant improvement in swelling and pain. Will taper to prednisone today, will be tapered over  next three days. 6. Cystic pelvic mass- unchanged 6 mm cystic pelvic mass. Followed by OB/GYN as outpatient. 7. Lactic acidosis-resolved, repeat lactate 1.1 IV Zosyn discontinued. Blood cultures and urine cultures are negative so far 8. Atrial fibrillation with RVR- will restart Coumadin, metoprolol, Cardizem. INR has been supratherapeutic. Last INR 4.56 9. Diabetes mellitus-continue sliding scale insulin with NovoLog. Blood glucose controlled. 10. Hypertension- blood pressure is stable.     DVT prophylaxis: SCDs,   Code Status: Full code  Family Communication: Discussed with patient's daughter at bedside   Disposition Plan: SNF   Consultants:  General surgery  Procedures:  None  Continuous infusions . dextrose 5 % and 0.9% NaCl 10 mL/hr at 10/25/16 8416      Antibiotics:   Anti-infectives    Start     Dose/Rate Route Frequency Ordered Stop   10/19/16 0600  piperacillin-tazobactam (ZOSYN) IVPB 2.25 g  Status:  Discontinued     2.25 g 100 mL/hr over 30 Minutes Intravenous Every 6 hours 10/18/16 2303 10/20/16 1106   10/18/16 2215  piperacillin-tazobactam (ZOSYN) IVPB 3.375 g     3.375 g 100 mL/hr over 30 Minutes Intravenous  Once 10/18/16 2214 10/18/16 2310       Objective   Vitals:   10/25/16 1602 10/25/16 2022 10/25/16 2211 10/26/16 0500  BP: 130/85 (!) 120/98 (!) 122/55 (!) 141/94  Pulse: (!) 145 (!) 130 (!) 129 (!) 123  Resp: 18 18  18  Temp: 98.5 F (36.9 C) 98.8 F (37.1 C)  98.8 F (37.1 C)  TempSrc: Oral Oral  Oral  SpO2: 90% 93%  93%  Weight:      Height:        Intake/Output Summary (Last 24 hours) at 10/26/16 1132 Last data filed at 10/26/16 0800  Gross per 24 hour  Intake          1266.83 ml  Output             1875 ml  Net           -608.17 ml   Filed Weights   10/18/16 2121 10/19/16 0213  Weight: 74.8 kg (165 lb) 74.8 kg (165 lb)     Physical Examination:  Physical Exam: Eyes: No icterus, extraocular muscles intact  Mouth: Oral mucosa is moist, no lesions on palate,  Neck: Supple, no deformities, masses, or tenderness Lungs: Normal respiratory effort, bilateral clear to auscultation, no crackles or wheezes.  Heart: Irregular rhythm,  S1 and S2 normal, no murmurs, rubs auscultated Abdomen: BS normoactive,soft,nondistended,non-tender to palpation,no organomegaly Extremities: No pretibial edema, no erythema, no cyanosis, no clubbing Neuro : Alert and oriented to time, place and person, No focal deficits Skin: No rashes seen on exam     Data Reviewed: I have personally reviewed following labs and imaging studies  CBG:  Recent Labs Lab 10/25/16 2013 10/26/16 0038 10/26/16 0434 10/26/16 0727 10/26/16 1129  GLUCAP 188* 185* 131* 141* 220*    CBC:  Recent Labs Lab 10/20/16 0519 10/22/16 0506 10/26/16 0703  WBC 3.2* 5.9  --   HGB 11.2* 11.5* 9.9*  HCT 35.6* 37.0 32.3*  MCV 81.7 83.1  --   PLT 114* 136*  --     Basic Metabolic Panel:  Recent Labs Lab 10/19/16 1405  10/21/16 0530 10/22/16 0506 10/23/16 0640 10/24/16 0452 10/25/16 0955 10/26/16 0854  NA  --   < > 145 144 142 142 142 139  K  --   < > 3.0* 2.8* 2.7* 2.8* 3.1* 3.4*  CL  --   < > 113* 109 103 102 104 103  CO2  --   < > 27 30 30 30 29 28   GLUCOSE  --   < > 165* 147* 143* 175* 123* 222*  BUN  --   < > 20 10 6 8 14 20   CREATININE  --   < > 0.87 0.71 0.68 0.71 0.69 0.79  CALCIUM  --   < > 9.1 9.1 8.8* 8.6* 8.6* 8.6*  MG 1.6*  --  1.5*  --  1.4* 1.5* 1.9  --   < > = values in this interval not displayed.  Recent Results (from the past 240 hour(s))  Blood Culture (routine x 2)     Status: None   Collection Time: 10/18/16  9:40 PM  Result Value Ref Range Status   Specimen Description BLOOD RIGHT WRIST  Final    Special Requests   Final    BOTTLES DRAWN AEROBIC AND ANAEROBIC Blood Culture adequate volume   Culture   Final    NO GROWTH 5 DAYS Performed at [facility], [address], [place], [place] [address]    Report Status 10/24/2016 FINAL  Final  Urine culture     Status: None   Collection Time: 10/18/16  9:47 PM  Result Value Ref Range Status   Specimen Description URINE, CATHETERIZED  Final   Special Requests NONE  Final  Culture   Final    NO GROWTH Performed at [facility], [place] [address] [place], [place] [address]    Report Status 10/20/2016 FINAL  Final  Blood Culture (routine x 2)     Status: None   Collection Time: 10/18/16 10:40 PM  Result Value Ref Range Status   Specimen Description BLOOD RIGHT WRIST  Final   Special Requests IN PEDIATRIC BOTTLE Blood Culture adequate volume  Final   Culture   Final    NO GROWTH 5 DAYS Performed at [facility], [place] [address] [place], [place] [address]    Report Status 10/24/2016 FINAL  Final     Liver Function Tests:  Recent Labs Lab 10/23/16 0640  AST 18  ALT 15  ALKPHOS 49  BILITOT 0.6  PROT 5.5*  ALBUMIN 2.5*   No results for input(s): LIPASE, AMYLASE in the last 168 hours. No results for input(s): AMMONIA in the last 168 hours.  Cardiac Enzymes: No results for input(s): CKTOTAL, CKMB, CKMBINDEX, TROPONINI in the last 168 hours. BNP (last 3 results) No results for input(s): BNP in the last 8760 hours.  ProBNP (last 3 results) No results for input(s): PROBNP in the last 8760 hours.    Studies: No results found.  Scheduled Meds: . feeding supplement  1 Container Oral TID BM  . insulin aspart  0-9 Units Subcutaneous Q4H  . metoprolol tartrate  2.5 mg Intravenous Q6H  . polyethylene glycol  17 g Oral Daily  . predniSONE  20 mg Oral Q breakfast      Time spent: 25 min  [facility] Pager [phone]. If 7PM-7AM, please contact night-coverage at  [email or web address], Office  [phone]  password [email or web address]  10/26/2016, 11:32 AM  LOS: 8 days

## 2016-10-26 NOTE — NC FL2 (Signed)
[place] LEVEL OF CARE SCREENING TOOL     IDENTIFICATION  Patient Name: Chelsea Dean Birthdate: 21-Jul-1927 Sex: female Admission Date (Current Location): 10/18/2016  [facility] and [place] Number:  Herbalist and Address:  [facility],  [place] [address] [place]      Provider Number: [id number]  Attending Physician Name and Address:  Oswald Hillock, MD  Relative Name and Phone Number:       Current Level of Care: Hospital Recommended Level of Care: [place] Prior Approval Number:    Date Approved/Denied:   PASRR Number: [id number]  Discharge Plan: SNF    Current Diagnoses: Patient Active Problem List   Diagnosis Date Noted  . Acute idiopathic gout of right foot   . A-fib ([place]) 10/19/2016  . DM2 (diabetes mellitus, type 2) ([place]) 10/19/2016  . Pelvic mass 10/19/2016  . SBO (small bowel obstruction) ([place]) 10/18/2016  . AKI (acute kidney injury) ([place]) 10/18/2016  . Right renal mass     Orientation RESPIRATION BLADDER Height & Weight     Self, Time, Situation, Place  O2 (2L) Incontinent Weight: 165 lb (74.8 kg) Height:  5\' 2"  (157.5 cm)  BEHAVIORAL SYMPTOMS/MOOD NEUROLOGICAL BOWEL NUTRITION STATUS      Incontinent Diet (full liquid diet (advancing, see DC summary))  AMBULATORY STATUS COMMUNICATION OF NEEDS Skin   Limited Assist Verbally Normal                       Personal Care Assistance Level of Assistance  Bathing, Feeding, Dressing Bathing Assistance: Limited assistance Feeding assistance: Independent Dressing Assistance: Limited assistance     Functional Limitations Info  Sight, Hearing, Speech Sight Info: Adequate Hearing Info: Adequate Speech Info: Adequate    SPECIAL CARE FACTORS FREQUENCY  PT (By licensed PT), OT (By licensed OT)     PT Frequency: 5x OT Frequency: 5x            Contractures Contractures Info: Not present    Additional Factors Info  Code Status,  Allergies Code Status Info: full code Allergies Info: Ace Inhibitors, Contrast Media Iodinated Diagnostic Agents, Nsaids           Current Medications (10/26/2016):  This is the current hospital active medication list Current Facility-Administered Medications  Medication Dose Route Frequency Provider Last Rate Last Dose  . acetaminophen (TYLENOL) tablet 650 mg  650 mg Oral Q6H PRN Etta Quill, DO   650 mg at 10/24/16 2221   Or  . acetaminophen (TYLENOL) suppository 650 mg  650 mg Rectal Q6H PRN Etta Quill, DO      . dextrose 5 % and 0.45% NaCl 1,000 mL with potassium chloride 20 mEq infusion   Intravenous Continuous Oswald Hillock, MD 75 mL/hr at 10/26/16 1231    . diltiazem (CARDIZEM CD) 24 hr capsule 120 mg  120 mg Oral Daily Oswald Hillock, MD   120 mg at 10/26/16 1229  . feeding supplement (BOOST / RESOURCE BREEZE) liquid 1 Container  1 Container Oral TID BM Darrick Meigs, Marge Duncans, MD      . fentaNYL (SUBLIMAZE) injection 12.5-25 mcg  12.5-25 mcg Intravenous Q1H PRN Opyd, Ilene Qua, MD   12.5 mcg at 10/24/16 0214  . insulin aspart (novoLOG) injection 0-9 Units  0-9 Units Subcutaneous Q4H Etta Quill, DO   3 Units at 10/26/16 1229  . ipratropium-albuterol (DUONEB) 0.5-2.5 (3) MG/3ML nebulizer solution 3 mL  3 mL Nebulization  Q4H PRN Fuller Plan A, MD      . metoprolol tartrate (LOPRESSOR) tablet 100 mg  100 mg Oral BID Oswald Hillock, MD   100 mg at 10/26/16 1228  . ondansetron (ZOFRAN) tablet 4 mg  4 mg Oral Q6H PRN Etta Quill, DO   4 mg at 10/22/16 2049   Or  . ondansetron [facility]) injection 4 mg  4 mg Intravenous Q6H PRN Etta Quill, DO   4 mg at 10/20/16 2027  . polyethylene glycol (MIRALAX / GLYCOLAX) packet 17 g  17 g Oral Daily Jill Alexanders, PA-C   17 g at 10/26/16 0935  . [START ON 10/27/2016] predniSONE (DELTASONE) tablet 30 mg  30 mg Oral Q breakfast Oswald Hillock, MD       Followed by  . [START ON 10/28/2016] predniSONE (DELTASONE) tablet 20 mg  20 mg  Oral Q breakfast Oswald Hillock, MD       Followed by  . [START ON 10/29/2016] predniSONE (DELTASONE) tablet 10 mg  10 mg Oral Q breakfast Oswald Hillock, MD      . Warfarin - Pharmacist Dosing Inpatient   Does not apply q1800 Berton Mount, [facility]         Discharge Medications: Please see discharge summary for a list of discharge medications.  Relevant Imaging Results:  Relevant Lab Results:   Additional Information SS# [id number]  Nila Nephew, LCSW

## 2016-10-26 NOTE — Clinical Social Work Note (Signed)
Clinical Social Work Assessment  Patient Details  Name: Chelsea Dean MRN: [id number] Date of Birth: 1927/12/15  Date of referral:  10/26/16               Reason for consult:  Facility Placement                Permission sought to share information with:  Family Supports Permission granted to share information::  Yes, Verbal Permission Granted  Name::     daughter Hydrologist::     Relationship::     Contact Information:     Housing/Transportation Living arrangements for the past 2 months:  Single Family Home Source of Information:  Patient, Medical Team, Adult Children Patient Interpreter Needed:  None Criminal Activity/Legal Involvement Pertinent to Current Situation/Hospitalization:  No - Comment as needed Significant Relationships:  Adult Children, Spouse Lives with:  Spouse Do you feel safe going back to the place where you live?  Yes Need for family participation in patient care:  No (Coment)  Care giving concerns:  Pt from home where she resides with her husband. Was independent (sometimes using rolling walker or cane) prior to hospitalization. She is currently needing assistance for transfers and ambulating. Feels her husband (who is older than her and she cares for to some degree) could not manage her care at home.    Social Worker assessment / plan:  CSW consulted to assist with DC planning - potential SNF placement. Met with pt at bedside- daughter present and participated as well. Discussed with both the recommendation for SNF for short term rehab. Pt deliberated for some time, expressing she would rather return home, however as she and daughter discussed what her care situation is at home (husband at home requires some assistance and therefore could not manage all her care needs), decided that SNF is best care plan for her.  Prefers [facility] or [place] area. Obtained PASSR, completed FL2 and made area referrals.  Plan: SNF at DC- will follow up with bed offers.    Employment status:   retired Programmer, applications PT Recommendations:  [place], [place] / Referral to community resources:  [place]  Patient/Family's Response to care:  Pt and daughter both appreciative of care  Patient/Family's Understanding of and Emotional Response to Diagnosis, Current Treatment, and Prognosis:  Both pt and daughter demonstrate thorough understanding of plan. Both engaged appropriately. Pt seemed sad, states, "I wish I could go home, but I realize that is not the best plan for me, I need rehab to get stronger."   Emotional Assessment Appearance:  Appears older than stated age Attitude/Demeanor/Rapport:   (pleasant) Affect (typically observed):  Sad, Accepting Orientation:  Oriented to Place, Oriented to  Time, Oriented to Situation, Oriented to Self Alcohol / Substance use:  Not Applicable Psych involvement (Current and /or in the community):  No (Comment)  Discharge Needs  Concerns to be addressed:  Discharge Planning Concerns Readmission within the last 30 days:  No Current discharge risk:  None Barriers to Discharge:  Continued Medical Work up   [employer], LCSW 10/26/2016, 1:49 PM [phone]

## 2016-10-26 NOTE — Care Management Important Message (Signed)
Important Message  Patient Details  Name: Chelsea Dean MRN: [id number] Date of Birth: 1927-10-24   Medicare Important Message Given:  Yes    Kerin Salen 10/26/2016, 11:03 AMImportant Message  Patient Details  Name: Chelsea Dean MRN: [id number] Date of Birth: 1927-05-28   Medicare Important Message Given:  Yes    Kerin Salen 10/26/2016, 11:03 AM

## 2016-10-27 DIAGNOSIS — K56609 Unspecified intestinal obstruction, unspecified as to partial versus complete obstruction: Principal | ICD-10-CM

## 2016-10-27 LAB — CBC
HEMATOCRIT: 37.5 % (ref 36.0–46.0)
HEMOGLOBIN: 11.7 g/dL — AB (ref 12.0–15.0)
MCH: 25.7 pg — AB (ref 26.0–34.0)
MCHC: 31.2 g/dL (ref 30.0–36.0)
MCV: 82.2 fL (ref 78.0–100.0)
Platelets: 182 10*3/uL (ref 150–400)
RBC: 4.56 MIL/uL (ref 3.87–5.11)
RDW: 15 % (ref 11.5–15.5)
WBC: 9.5 10*3/uL (ref 4.0–10.5)

## 2016-10-27 LAB — COMPREHENSIVE METABOLIC PANEL
ALBUMIN: 2.4 g/dL — AB (ref 3.5–5.0)
ALT: 61 U/L — ABNORMAL HIGH (ref 14–54)
AST: 69 U/L — AB (ref 15–41)
Alkaline Phosphatase: 63 U/L (ref 38–126)
Anion gap: 11 (ref 5–15)
BUN: 16 mg/dL (ref 6–20)
CHLORIDE: 98 mmol/L — AB (ref 101–111)
CO2: 29 mmol/L (ref 22–32)
Calcium: 8.5 mg/dL — ABNORMAL LOW (ref 8.9–10.3)
Creatinine, Ser: 0.66 mg/dL (ref 0.44–1.00)
GFR calc Af Amer: >60 mL/min (ref >60)
Glucose, Bld: 143 mg/dL — ABNORMAL HIGH (ref 65–99)
POTASSIUM: 3.3 mmol/L — AB (ref 3.5–5.1)
SODIUM: 138 mmol/L (ref 135–145)
Total Bilirubin: 0.5 mg/dL (ref 0.3–1.2)
Total Protein: 5.6 g/dL — ABNORMAL LOW (ref 6.5–8.1)

## 2016-10-27 LAB — GLUCOSE, CAPILLARY
GLUCOSE-CAPILLARY: 166 mg/dL — AB (ref 65–99)
GLUCOSE-CAPILLARY: 117 mg/dL — AB (ref 65–99)
GLUCOSE-CAPILLARY: 177 mg/dL — AB (ref 65–99)
Glucose-Capillary: 129 mg/dL — ABNORMAL HIGH (ref 65–99)

## 2016-10-27 LAB — PROTIME-INR
INR: 2.75
PROTHROMBIN TIME: 28.9 s — AB (ref 11.4–15.2)

## 2016-10-27 MED ORDER — WARFARIN SODIUM 2.5 MG PO TABS: 1.2500 mg | ORAL_TABLET | Freq: Once | ORAL | Status: DC

## 2016-10-27 MED ORDER — POTASSIUM CHLORIDE CRYS ER 20 MEQ PO TBCR
40.0000 meq | EXTENDED_RELEASE_TABLET | Freq: Once | ORAL | Status: AC
Start: 2016-10-27 — End: 2016-10-27
  Administered 2016-10-27: 40 meq via ORAL
  Filled 2016-10-27: qty 2

## 2016-10-27 MED ORDER — PREDNISONE 20 MG PO TABS: 20.0000 mg | ORAL_TABLET | Freq: Every day | ORAL | 0 refills | Status: DC

## 2016-10-27 MED ORDER — PREDNISONE 10 MG PO TABS: 10.0000 mg | ORAL_TABLET | Freq: Every day | ORAL | 0 refills | Status: DC

## 2016-10-27 MED ORDER — WARFARIN SODIUM 2.5 MG PO TABS: ORAL_TABLET | ORAL | 0 refills | Status: DC

## 2016-10-27 MED ORDER — POLYETHYLENE GLYCOL 3350 17 G PO PACK: 17.0000 g | PACK | Freq: Every day | ORAL | 0 refills | Status: DC

## 2016-10-27 NOTE — Clinical Social Work Placement (Signed)
Pt/family provided with SNF bed offers and they select [facility]. CSW selected facility in [place] and confirmed with admissions. Pt's daughter going to facility to complete admissions paperwork All information provided to facility via the [facility]. Pt will transport via [facility] completed medical necessity form and arranged transport.   Report # for [id number]  See below for placement details   CLINICAL SOCIAL WORK PLACEMENT  NOTE  Date:  10/27/2016  Patient Details  Name: Chelsea Dean MRN: [id number] Date of Birth: [birth year]  Clinical Social Work is seeking post-discharge placement for this patient at the [place] level of care (*CSW will initial, date and re-position this form in  chart as items are completed):  Yes   Patient/family provided with [facility] Work Department's list of facilities offering this level of care within the geographic area requested by the patient (or if unable, by the patient's family).  Yes   Patient/family informed of their freedom to choose among providers that offer the needed level of care, that participate in Medicare, Medicaid or managed care program needed by the patient, have an available bed and are willing to accept the patient.  Yes   Patient/family informed of [facility]'s ownership interest in [facility] and [facility], as well as of the fact that they are under no obligation to receive care at these facilities.  PASRR submitted to EDS on       PASRR number received on 10/26/16     Existing PASRR number confirmed on       FL2 transmitted to all facilities in geographic area requested by pt/family on 10/26/16     FL2 transmitted to all facilities within larger geographic area on       Patient informed that his/her managed care company has contracts with or will negotiate with certain facilities, including the following:        Yes   Patient/family informed of bed offers received.  Patient  chooses bed at [facility] and [place] recommends and patient chooses bed at [facility] Rehab    Patient to be transferred to [facility] on 10/27/16.  Patient to be transferred to facility by [facility]     Patient family notified on 10/27/16 of transfer.  Name of family member notified:  daughter [name]     PHYSICIAN       Additional Comment:    _______________________________________________ Nila Nephew, LCSW 10/27/2016, 11:42 AM

## 2016-10-27 NOTE — Discharge Summary (Signed)
Physician Discharge Summary  Chelsea Dean ZOX:[id number] DOB: [birth year] DOA: 10/18/2016  PCP: [name], MD  Admit date: 10/18/2016 Discharge date: 10/27/2016  Admitted From: Home Disposition:  SNF  Recommendations for Outpatient Follow-up:  1. Follow up with PCP in 1 week 2. Please obtain BMP in 1 week to check hypokalemia, repeat LFT as outpatient  3. Continue to check INR and adjust Coumadin as needed. Dose is reduced at time of discharge due to supratherapeutic INR. 4. 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 Patient presents to the ED at [facility] with c/o ongoing abd pain and distention. Symptoms onset about 1 week ago. Initially thought it was UTI and got Macrobid from PCP. CT scan and lab work showed large pelvic mass, was referred to Gyn/onc for follow up, although gyn onc notes in their note that mass appears stable and not really changed for several years now (despite its large size). Returned to [facility] today with worsening abd pain. Repeat CT scan showed SBO. Gen. surgery was consulted, patient was managed conservatively for small bowel obstruction. NG tube was placed, IV fluids continued. After removal of NG tube, patient had multiple bowel movements, diet was slowly advanced. On day of discharge, patient was tolerating soft diet. Physical therapy worked with patient and recommended skilled nursing facility on discharge. Gen. surgery also recommending continuing daily. MiraLAX as needed to avoid constipation.  Discharge Diagnoses:  Principal Problem:   SBO  (small bowel obstruction) (HCC) Active Problems:   AKI (acute kidney injury) ([place])   A-fib ([place])   DM2 (diabetes mellitus, type 2) (HCC)   Pelvic mass   Acute idiopathic gout of right foot   1. Small bowel obstruction- Resolved, NG tube removed. General surgery following. Diet advanced. Had 5 BM yesterday, no nausea, vomiting, abdominal pain. Continue miralax to avoid constipation.  2. Hypokalemia- Replaced, repeat lab as outpatient  3. Acute kidney injury- Resolved with IV fluids. Renal ultrasound showed urinary distention. Foley catheter was placed, which has been discontinued. Resume Cozaar. 4. Gout- Patient has flareup of gout, had pain with swelling in the right foot and right knee, was started on Solu-Medrol 40 mg IV every 12 hours. Significant improvement in swelling and pain. Taper prednisone on discharge, 20mg  on 9/13 and 10mg  on 9/14  5. Cystic pelvic mass- Unchanged 6 mm cystic pelvic mass. Followed by OB/GYN as outpatient. 6. Lactic acidosis- Resolved 7. Atrial fibrillation with RVR- Restart Coumadin, metoprolol, Cardizem. Rate controlled today.  8. Diabetes mellitus- Blood glucose controlled on SSI in hospital. Continue home glipizide, will defer to PCP on further blood sugar control and perhaps reducing dose in this 83 yo patient  9. Hypertension- Resume cozaar, norvasc, cardizem, metoprolol  10. HLD- Hold lipitor, repeat LFT as outpatient prior to resuming      Discharge Instructions  Discharge Instructions    Discharge instructions    Complete by:  As directed    You were cared for by a hospitalist during your hospital stay. If you have any questions about your discharge medications or the care you received while you were in  the hospital after you are discharged, you can call the unit and asked to speak with the hospitalist on call if the hospitalist that took care of you is not available. Once you are discharged, your primary care physician will handle any further medical  issues. Please note that NO REFILLS for any discharge medications will be authorized once you are discharged, as it is imperative that you return to your primary care physician (or establish a relationship with a primary care physician if you do not have one) for your aftercare needs so that they can reassess your need for medications and monitor your lab values.   Increase activity slowly    Complete by:  As directed      Allergies as of 10/27/2016      Reactions   Ace Inhibitors    Contrast Media [iodinated Diagnostic Agents]    Nsaids       Medication List    STOP taking these medications   atorvastatin 20 MG tablet Commonly known as:  LIPITOR     TAKE these medications   acetaminophen 650 MG CR tablet Commonly known as:  TYLENOL Take 650 mg by mouth daily as needed for pain.   amLODipine 10 MG tablet Commonly known as:  NORVASC Take 10 mg by mouth daily.   CARTIA XT 120 MG 24 hr capsule Generic drug:  diltiazem Take 120 mg by mouth daily.   diclofenac sodium 1 % Gel Commonly known as:  VOLTAREN Apply 2 g topically daily as needed (KNEE PAIN).   glipiZIDE 10 MG 24 hr tablet Commonly known as:  GLUCOTROL XL Take 10 mg by mouth daily with breakfast.   losartan 100 MG tablet Commonly known as:  COZAAR Take 100 mg by mouth daily.   metoprolol tartrate 100 MG tablet Commonly known as:  LOPRESSOR Take 100 mg by mouth 2 (two) times daily.   polyethylene glycol packet Commonly known as:  MIRALAX / GLYCOLAX Take 17 g by mouth daily.   potassium chloride SA 20 MEQ tablet Commonly known as:  K-DUR,KLOR-CON Take 20 mEq by mouth 2 (two) times daily.   predniSONE 20 MG tablet Commonly known as:  DELTASONE Take 1 tablet (20 mg total) by mouth daily with breakfast.   predniSONE 10 MG tablet Commonly known as:  DELTASONE Take 1 tablet (10 mg total) by mouth daily with breakfast. Start taking on:  10/29/2016   traMADol 50 MG tablet Commonly known as:  ULTRAM Take 50  mg by mouth every 8 (eight) hours as needed for moderate pain.   warfarin 2.5 MG tablet Commonly known as:  COUMADIN Take 2.5mg  daily except 1.25mg  on Tue, Thur, Sat What changed:  medication strength  how much to take  how to take this  when to take this  additional instructions            Discharge Care Instructions        Start     Ordered   10/29/16 0000  predniSONE (DELTASONE) 10 MG tablet  Daily with breakfast     10/27/16 1105   10/28/16 0000  polyethylene glycol (MIRALAX / GLYCOLAX) packet  Daily     10/27/16 1105   10/28/16 0000  predniSONE (DELTASONE) 20 MG tablet  Daily with breakfast     10/27/16 1105   10/27/16 0000  warfarin (COUMADIN) 2.5 MG tablet     10/27/16 1105   10/27/16 0000  Increase activity slowly     10/27/16 1105   10/27/16  0000  Discharge instructions    Comments:  You were cared for by a hospitalist during your hospital stay. If you have any questions about your discharge medications or the care you received while you were in the hospital after you are discharged, you can call the unit and asked to speak with the hospitalist on call if the hospitalist that took care of you is not available. Once you are discharged, your primary care physician will handle any further medical issues. Please note that NO REFILLS for any discharge medications will be authorized once you are discharged, as it is imperative that you return to your primary care physician (or establish a relationship with a primary care physician if you do not have one) for your aftercare needs so that they can reassess your need for medications and monitor your lab values.   10/27/16 1105     Contact information for after-discharge care    Destination    [facility] .   Specialty:  [place] information: [place] [phone]             Allergies  Allergen Reactions  . Ace Inhibitors    . Contrast Media [Iodinated Diagnostic Agents]   . Nsaids     Consultations:  General Surgery    Procedures/Studies: Ct Abdomen Pelvis Wo Contrast  Result Date: 10/18/2016 CLINICAL DATA:  Worsening abdominal distention and pain for several days. Seen at another facility yesterday. EXAM: CT ABDOMEN AND PELVIS WITHOUT CONTRAST TECHNIQUE: Multidetector CT imaging of the abdomen and pelvis was performed following the standard protocol without IV contrast. COMPARISON:  10/17/2016 FINDINGS: Lower chest: Unchanged small right pleural effusion and minimal atelectatic appearing linear lung base opacities. Hepatobiliary: No focal liver abnormality is seen. No gallstones, gallbladder wall thickening, or biliary dilatation. Pancreas: Unremarkable. No pancreatic ductal dilatation or surrounding inflammatory changes. Spleen: Normal in size without focal abnormality. Adrenals/Urinary Tract: Both adrenals are unremarkable. Stable scarring of the kidneys. No suspicious renal parenchymal lesions. No hydronephrosis. No urinary calculi. Unremarkable ureters. Urinary bladder is decompressed around a Foley catheter. Stomach/Bowel: New marked dilatation of proximal and mid small bowel. Decompressed distal small bowel. Enteric contrast persists in the small bowel from yesterday's CT, although some of the contrast has reached the transverse colon. There is mild mesenteric edema. Colon is unremarkable except for uncomplicated diverticulosis. Vascular/Lymphatic: Unchanged 6-7 cm abdominal aortic aneurysm with endovascular stent graft. No retroperitoneal hemorrhage. Reproductive: Unchanged 16 cm cystic mass in the pelvis, likely of right ovarian origin. Other: Small volume ascites, increased from yesterday. No free intraperitoneal air. Musculoskeletal: No significant skeletal lesion. IMPRESSION: 1. Small bowel obstruction, transition point probably in the low abdomen to the left of midline. 2. Small volume ascites, increased from  yesterday. 3. Small right pleural effusion and minimal atelectatic lung base opacities, unchanged. 4. Unchanged abdominal aortic aneurysm with endovascular stent graft. Unchanged 16 cm cystic pelvic mass. Uncomplicated mild colonic diverticulosis. Electronically Signed   By: Andreas Newport M.D.   On: 10/18/2016 23:00   Dg Abdomen 1 View  Result Date: 10/19/2016 CLINICAL DATA:  Nasogastric tube placement EXAM: ABDOMEN - 1 VIEW COMPARISON:  CT 10/18/2016 FINDINGS: The nasogastric tube extends into the stomach, tip may be in the proximal duodenum. Persistent dilated stacked small bowel loops consistent with obstruction. No extraluminal air is evident. IMPRESSION: 1. NG tube extends well into the stomach, possibly into the proximal duodenum. 2. Obstructive gas pattern with dilated stacked  small bowel loops. Electronically Signed   By: Andreas Newport M.D.   On: 10/19/2016 00:15   US Renal  Result Date: 10/20/2016 CLINICAL DATA:  Acute kidney injury. EXAM: RENAL / URINARY TRACT ULTRASOUND COMPLETE COMPARISON:  Abdomen and pelvis CT dated 10/18/2016. FINDINGS: Right Kidney: Length: 10.9 cm. Mildly echogenic. Diffuse parenchymal thinning and prominent renal sinus fat. No hydronephrosis. Left Kidney: Length: 11.9 cm. Mildly echogenic. Diffuse parenchymal thinning and prominent renal sinus fat. 2.2 cm mid renal cyst. There is also a 1.4 cm upper pole cyst with internal echoes. No hydronephrosis. Bladder: Dilated urinary bladder with a calculated volume of 1075 cc. Small inferior bladder diverticulum. IMPRESSION: 1. Dilated urinary bladder with a small inferior diverticulum. 2. Mildly echogenic kidneys compatible with medical renal disease. 3. Moderate bilateral renal parenchymal atrophy. 4. No hydronephrosis. Electronically Signed   By: Claudie Revering M.D.   On: 10/20/2016 11:46   Dg Chest Portable 1 View  Result Date: 10/18/2016 CLINICAL DATA:  Abdominal distension for 8 days EXAM: PORTABLE CHEST 1 VIEW  COMPARISON:  CT 01/13/2010 FINDINGS: Cardiomegaly with generous appearance of mediastinum. Aortic atherosclerosis. Small pleural effusions. Streaky bibasilar atelectasis or infiltrates. Diffuse interstitial prominence. No pneumothorax. IMPRESSION: 1. Cardiomegaly with tiny pleural effusions and streaky bibasilar atelectasis or infiltrates 2. Prominent mediastinum, uncertain chronicity and likely augmented by portable technique and rotation. If mediastinal abnormality is suspected, CT chest follow-up could be obtained for further evaluation 3. Mild diffuse interstitial opacity, suspect that this is related to chronic changes. Electronically Signed   By: Donavan Foil M.D.   On: 10/18/2016 23:06   Dg Abd Portable 1v  Result Date: 10/22/2016 CLINICAL DATA:  Small-bowel obstruction EXAM: PORTABLE ABDOMEN - 1 VIEW COMPARISON:  Pain 07/2016 FINDINGS: Similar mild gas distention of the small bowel but there is air and contrast throughout the colon. Appearance more compatible with resolving obstruction or residual ileus. Diverticulosis noted. Aortic aneurysm endograft repair evident. NG tube in the stomach. Degenerative changes of the spine osteopenia. IMPRESSION: Similar gas distention of small bowel but with air and contrast in the colon, more compatible with resolving obstruction or mild ileus. Electronically Signed   By: Jerilynn Mages.  Shick M.D.   On: 10/22/2016 09:35   Dg Abd Portable 1v  Result Date: 10/21/2016 CLINICAL DATA:  Small bowel obstruction, followup EXAM: PORTABLE ABDOMEN - 1 VIEW COMPARISON:  Portable exam 1736 hours compared to 0455 hours FINDINGS: Tip of nasogastric tube projects over stomach. Aortoiliac stent. Retained contrast in colon, which appears decompressed. Distal colonic diverticulosis noted. Dilated small bowel loops in mid abdomen, little changed. No definite bowel wall thickening. Bones demineralized. IMPRESSION: Persistent small while distension little changed from earlier study. Electronically  Signed   By: Lavonia Dana M.D.   On: 10/21/2016 18:00   Dg Abd Portable 1v  Result Date: 10/21/2016 CLINICAL DATA:  Follow-up small bowel obstruction EXAM: PORTABLE ABDOMEN - 1 VIEW COMPARISON:  Abdominal radiograph of October 20, 2016 FINDINGS: There remain loops of moderately distended gas-filled small bowel in the mid and left abdomen. There is contrast within the colon from the ascending colon to the rectum. The esophagogastric tubes proximal port lies at or above the GE junction with the tip in the cardia. There is an aorto bi-iliac stent graft. IMPRESSION: Persistent small bowel obstruction with perhaps slightly decreased gaseous distention of small-bowel loops. No free extraluminal gas collections are observed. Advancement of the nasogastric tube by 10 cm is recommended to assure that the proximal port is positioned below  the GE junction. Electronically Signed   By: David  Martinique M.D.   On: 10/21/2016 07:08   Dg Abd Portable 1v  Result Date: 10/20/2016 CLINICAL DATA:  NG tube placement EXAM: PORTABLE ABDOMEN - 1 VIEW COMPARISON:  10/20/2016 FINDINGS: NG tube has been advanced slightly with the tip in the proximal stomach. The side-port is likely near the GE junction. Continued gaseous distention of bowel. IMPRESSION: NG tube slightly advanced with the tip in the proximal stomach. The side port appears to be near the GE junction. Electronically Signed   By: Rolm Baptise M.D.   On: 10/20/2016 11:36   Dg Abd Portable 1v  Result Date: 10/20/2016 CLINICAL DATA:  Small bowel obstruction, follow-up EXAM: PORTABLE ABDOMEN - 1 VIEW COMPARISON:  10/18/2016 abdominal radiograph FINDINGS: Aorto bi-iliac stent graft is in place. Enteric tube terminates at the esophagogastric junction. There are mildly to moderately dilated small bowel loops in the central abdomen bilaterally measuring up to the 4.9 cm diameter, decreased in the interval, previously 6.9 cm diameter. Retained oral contrast in the large bowel.  Mild-to-moderate colonic gas and stool. No evidence of pneumatosis or pneumoperitoneum. No radiopaque urolithiasis. Stable hazy right lung base opacities. IMPRESSION: 1. Enteric tube terminates at the esophagogastric junction, consider advancing 6-8 cm. 2. Persistent mild-to-moderate central small bowel dilatation, decreased in the interval, suggesting improving distal small bowel obstruction. These results will be called to the ordering clinician or representative by the Radiologist Assistant, and communication documented in the PACS or zVision Dashboard. Electronically Signed   By: Ilona Sorrel M.D.   On: 10/20/2016 08:34   Dg Abd Portable 2v  Result Date: 10/23/2016 CLINICAL DATA:  Nausea, vomiting, abdominal pain. EXAM: PORTABLE ABDOMEN - 2 VIEW COMPARISON:  None. FINDINGS: Nasogastric tube with the tip projecting over the stomach, but recommend advancing the tube 8 cm. The bowel gas pattern is normal. There is oral contrast material in the colon. There is no evidence of free air. No radio-opaque calculi or other significant radiographic abnormality is seen. IMPRESSION: 1. Nasogastric tube with the tip projecting over the stomach, but recommend advancing the tube 8 cm. 2. No evidence bowel obstruction. Small amount contrast in the colon with mild gaseous distension of small bowel and colon likely reflecting resolving ileus. Electronically Signed   By: Kathreen Devoid   On: 10/23/2016 10:02       Discharge Exam: Vitals:   10/27/16 0838 10/27/16 0951  BP: (!) 160/93 (!) 160/88  Pulse: 85 82  Resp: 20   Temp: 97.8 F (36.6 C)   SpO2: 92%    Vitals:   10/27/16 0400 10/27/16 0454 10/27/16 0838 10/27/16 0951  BP: 138/87  (!) 160/93 (!) 160/88  Pulse: (!) 155 76 85 82  Resp: 18  20   Temp: 98.3 F (36.8 C)  97.8 F (36.6 C)   TempSrc: Oral  Oral   SpO2: 92%  92%   Weight:      Height:        General: Pt is alert, awake, not in acute distress Cardiovascular: irregular rhythm, S1/S2 +, no  rubs, no gallops Respiratory: CTA bilaterally, no wheezing, no rhonchi Abdominal: Soft, NT, ND, bowel sounds + Extremities: no edema, no cyanosis    The results of significant diagnostics from this hospitalization (including imaging, microbiology, ancillary and laboratory) are listed below for reference.     Microbiology: Recent Results (from the past 240 hour(s))  Blood Culture (routine x 2)     Status: None  Collection Time: 10/18/16  9:40 PM  Result Value Ref Range Status   Specimen Description BLOOD RIGHT WRIST  Final   Special Requests   Final    BOTTLES DRAWN AEROBIC AND ANAEROBIC Blood Culture adequate volume   Culture   Final    NO GROWTH 5 DAYS Performed at [facility], [address], [place], [place] [address]    Report Status 10/24/2016 FINAL  Final  Urine culture     Status: None   Collection Time: 10/18/16  9:47 PM  Result Value Ref Range Status   Specimen Description URINE, CATHETERIZED  Final   Special Requests NONE  Final   Culture   Final    NO GROWTH Performed at [facility], [place] [address] [place], [place] [address]    Report Status 10/20/2016 FINAL  Final  Blood Culture (routine x 2)     Status: None   Collection Time: 10/18/16 10:40 PM  Result Value Ref Range Status   Specimen Description BLOOD RIGHT WRIST  Final   Special Requests IN PEDIATRIC BOTTLE Blood Culture adequate volume  Final   Culture   Final    NO GROWTH 5 DAYS Performed at [facility], [place] [address], [place], [place] [address]    Report Status 10/24/2016 FINAL  Final     Labs: BNP (last 3 results) No results for input(s): BNP in the last 8760 hours. Basic Metabolic Panel:  Recent Labs Lab 10/21/16 0530  10/23/16 0640 10/24/16 0452 10/25/16 0955 10/26/16 0854 10/27/16 0438  NA 145  < > 142 142 142 139 138  K 3.0*  < > 2.7* 2.8* 3.1* 3.4* 3.3*  CL 113*  < > 103 102 104 103 98*  CO2 27  < > 30 30 29 28 29   GLUCOSE 165*  < > 143* 175* 123* 222* 143*   BUN 20  < > 6 8 14 20 16   CREATININE 0.87  < > 0.68 0.71 0.69 0.79 0.66  CALCIUM 9.1  < > 8.8* 8.6* 8.6* 8.6* 8.5*  MG 1.5*  --  1.4* 1.5* 1.9  --   --   < > = values in this interval not displayed. Liver Function Tests:  Recent Labs Lab 10/23/16 0640 10/27/16 0438  AST 18 69*  ALT 15 61*  ALKPHOS 49 63  BILITOT 0.6 0.5  PROT 5.5* 5.6*  ALBUMIN 2.5* 2.4*   No results for input(s): LIPASE, AMYLASE in the last 168 hours. No results for input(s): AMMONIA in the last 168 hours. CBC:  Recent Labs Lab 10/22/16 0506 10/26/16 0703 10/27/16 0930  WBC 5.9  --  9.5  HGB 11.5* 9.9* 11.7*  HCT 37.0 32.3* 37.5  MCV 83.1  --  82.2  PLT 136*  --  182   Cardiac Enzymes: No results for input(s): CKTOTAL, CKMB, CKMBINDEX, TROPONINI in the last 168 hours. BNP: Invalid input(s): POCBNP CBG:  Recent Labs Lab 10/26/16 1639 10/26/16 2021 10/27/16 0142 10/27/16 0337 10/27/16 0732  GLUCAP 178* 161* 166* 129* 117*   D-Dimer No results for input(s): DDIMER in the last 72 hours. Hgb A1c No results for input(s): HGBA1C in the last 72 hours. Lipid Profile No results for input(s): CHOL, HDL, LDLCALC, TRIG, CHOLHDL, LDLDIRECT in the last 72 hours. Thyroid function studies No results for input(s): TSH, T4TOTAL, T3FREE, THYROIDAB in the last 72 hours.  Invalid input(s): FREET3 Anemia work up No results for input(s): VITAMINB12, FOLATE, FERRITIN, TIBC, IRON, RETICCTPCT in  the last 72 hours. Urinalysis    Component Value Date/Time   COLORURINE YELLOW 10/18/2016 2147   APPEARANCEUR TURBID (A) 10/18/2016 2147   LABSPEC >1.030 (H) 10/18/2016 2147   PHURINE 5.5 10/18/2016 2147   GLUCOSEU NEGATIVE 10/18/2016 2147   HGBUR NEGATIVE 10/18/2016 2147   BILIRUBINUR MODERATE (A) 10/18/2016 2147   KETONESUR 15 (A) 10/18/2016 2147   PROTEINUR 100 (A) 10/18/2016 2147   NITRITE NEGATIVE 10/18/2016 2147   LEUKOCYTESUR NEGATIVE 10/18/2016 2147   Sepsis Labs Invalid input(s): PROCALCITONIN,   WBC,  LACTICIDVEN Microbiology Recent Results (from the past 240 hour(s))  Blood Culture (routine x 2)     Status: None   Collection Time: 10/18/16  9:40 PM  Result Value Ref Range Status   Specimen Description BLOOD RIGHT WRIST  Final   Special Requests   Final    BOTTLES DRAWN AEROBIC AND ANAEROBIC Blood Culture adequate volume   Culture   Final    NO GROWTH 5 DAYS Performed at [facility], [place] [address], [place], [place] [address]    Report Status 10/24/2016 FINAL  Final  Urine culture     Status: None   Collection Time: 10/18/16  9:47 PM  Result Value Ref Range Status   Specimen Description URINE, CATHETERIZED  Final   Special Requests NONE  Final   Culture   Final    NO GROWTH Performed at [facility], [place] [address], [place], [place] [address]    Report Status 10/20/2016 FINAL  Final  Blood Culture (routine x 2)     Status: None   Collection Time: 10/18/16 10:40 PM  Result Value Ref Range Status   Specimen Description BLOOD RIGHT WRIST  Final   Special Requests IN PEDIATRIC BOTTLE Blood Culture adequate volume  Final   Culture   Final    NO GROWTH 5 DAYS Performed at [facility], [place] [address] [place], [place] [address]    Report Status 10/24/2016 FINAL  Final     Time coordinating discharge: 40 minutes  SIGNED:  Dessa Phi, DO [facility] Pager [phone]  If 7PM-7AM, please contact night-coverage [email or web address] Password [email or web address] 10/27/2016, 11:05 AM

## 2016-10-27 NOTE — Progress Notes (Signed)
[place] for warfarin Indication: atrial fibrillation  Allergies  Allergen Reactions  . Ace Inhibitors   . Contrast Media [Iodinated Diagnostic Agents]   . Nsaids    Patient Measurements: Height: 5\' 2"  (157.5 cm) Weight: 165 lb (74.8 kg) IBW/kg (Calculated) : 50.1  Vital Signs: Temp: 98.3 F (36.8 C) (09/12 0400) Temp Source: Oral (09/12 0400) BP: 138/87 (09/12 0400) Pulse Rate: 76 (09/12 0454)  Labs:  Recent Labs  10/25/16 0416 10/25/16 0955 10/26/16 0418 10/26/16 0703 10/26/16 0854 10/27/16 0438  HGB  --   --   --  9.9*  --   --   HCT  --   --   --  32.3*  --   --   LABPROT 42.9*  --  39.8*  --   --  28.9*  INR 4.56*  --  4.15*  --   --  2.75  CREATININE  --  0.69  --   --  0.79 0.66   Estimated Creatinine Clearance: 46.9 mL/min (by C-G formula based on SCr of 0.66 mg/dL).  Infusions:    Assessment: 80 yoF with c/o abdominal pain x 8 days. Patient on warfarin for atrial fibrillation. Warfarin has been held since admission due to SBO. Pharmacy asked to resume warfarin now that SBO resolving.    Home regimen: - patient reported taking 5mg  daily but warfarin clinic note from 8/23 states take 5mg  daily except 2.5mg  on TuThSa  Today, 10/27/2016  INR therapeutic this morning at 2.75 (INR was supratherapeutic since 9/5 - no warfarin given as inpatient)  Suspect from steroids and NPO status   CBC: CBC pending  Drug-drug interactions: Steroids can increased warfarin's effect. Prednisone taper for gout  Goal of Therapy:  INR 2-3 Monitor platelets by anticoagulation protocol: Yes   Plan:   INR therapeutic today but concern for increased warfarin sensitivity due to current diet (advancing to soft diet today).  Warfarin 1.25mg  PO x 1 today   Daily INR - will need close monitoring with re-initiation of warfarin  Doreene Eland, PharmD, BCPS.   Pager: [phone] 10/27/2016 8:13 AM

## 2016-10-27 NOTE — Progress Notes (Signed)
Physical Therapy Treatment Patient Details Name: Chelsea Dean MRN: [id number] DOB: [birth year] Today's Date: 10/27/2016    History of Present Illness 83 yo female admitted with SBO. Now also with R LE gout flare. Hx of Afib, AAA, HTN, DM, renal carcinoma.    PT Comments    Progressing with mobility. Some improvement in pain control for R knee on today. Pt was able to tolerate walking a longer distance on today. Continue to recommend SNF for ST rehab.   Follow Up Recommendations  SNF     Equipment Recommendations  None recommended by PT    Recommendations for Other Services       Precautions / Restrictions Precautions Precautions: Fall Restrictions Weight Bearing Restrictions: No    Mobility  Bed Mobility               General bed mobility comments: oob in recliner  Transfers Overall transfer level: Needs assistance Equipment used: Rolling walker (2 wheeled) Transfers: Sit to/from Stand Sit to Stand: Min assist         General transfer comment: Assist to rise, stabilize, control descent. VCS safety, technique, hand/LE placement. Increased time.  Ambulation/Gait Ambulation/Gait assistance: Min assist Ambulation Distance (Feet): 40 Feet Assistive device: Rolling walker (2 wheeled) Gait Pattern/deviations: Step-to pattern;Antalgic;Decreased stance time - right     General Gait Details: VCs safety, sequence, technique, distance from RW, step length. Assist to stabilize throughout distance. Distance is limited by fatigue.   Stairs            Wheelchair Mobility    Modified Rankin (Stroke Patients Only)       Balance Overall balance assessment: Needs assistance         Standing balance support: Bilateral upper extremity supported Standing balance-Leahy Scale: Poor                              Cognition Arousal/Alertness: Awake/alert Behavior During Therapy: WFL for tasks assessed/performed Overall Cognitive Status: Within  Functional Limits for tasks assessed                                        Exercises      General Comments        Pertinent Vitals/Pain Pain Assessment: Faces Faces Pain Scale: Hurts even more Pain Location: R knee Pain Descriptors / Indicators: Sore;Aching Pain Intervention(s): Limited activity within patient's tolerance;Repositioned    Home Living                      Prior Function            PT Goals (current goals can now be found in the care plan section) Progress towards PT goals: Progressing toward goals    Frequency    Min 3X/week      PT Plan Current plan remains appropriate    Co-evaluation              AM-PAC PT "6 Clicks" Daily Activity  Outcome Measure  Difficulty turning over in bed (including adjusting bedclothes, sheets and blankets)?: Unable Difficulty moving from lying on back to sitting on the side of the bed? : Unable Difficulty sitting down on and standing up from a chair with arms (e.g., wheelchair, bedside commode, etc,.)?: Unable Help needed moving to and from a bed to chair (including a wheelchair)?: A  Little Help needed walking in hospital room?: A Little Help needed climbing 3-5 steps with a railing? : A Lot 6 Click Score: 11    End of Session Equipment Utilized During Treatment: Gait belt Activity Tolerance: Patient limited by fatigue Patient left: in chair;with call bell/phone within reach;with family/visitor present;with chair alarm set   PT Visit Diagnosis: Muscle weakness (generalized) (M62.81);Difficulty in walking, not elsewhere classified (R26.2)     Time: 7542-3702 PT Time Calculation (min) (ACUTE ONLY): 8 min  Charges:  $Gait Training: 8-22 mins                    G Codes:          Weston Anna, MPT Pager: [phone]

## 2016-10-27 NOTE — Progress Notes (Signed)
[facility] Progress Note     Subjective: CC:  Denies abdominal pain. Tolerating PO intake. Had 2 BMs this AM. urinating without hesitancy.  INR is now therapeutic and pt resuming warfarin. Potassium improving (3.3).  Objective: Vital signs in last 24 hours: Temp:  [97.8 F (36.6 C)-98.5 F (36.9 C)] 97.8 F (36.6 C) (09/12 0838) Pulse Rate:  [76-155] 82 (09/12 0951) Resp:  [18-20] 20 (09/12 0838) BP: (113-160)/(69-93) 160/88 (09/12 0951) SpO2:  [92 %-95 %] 92 % (09/12 0838) Last BM Date: 10/26/16  Intake/Output from previous day: 09/11 0701 - 09/12 0700 In: 1491.3 [P.O.:480; I.V.:1011.3] Out: 226 [Urine:225; Stool:1] Intake/Output this shift: Total I/O In: -  Out: 650 [Urine:650]  PE: Gen:  Alert, NAD, pleasant HEENT: pupils equal and round, anicteric sclerae  Card:  Regular rate, irregular rhythm, pedal pulses 2+ BL Pulm:  Normal effort, clear to auscultation bilaterally Abd: Soft, non-tender, non-distended, bowel sounds present in all 4 quadrants Skin: warm and dry, no rashes  Psych: A&Ox3   Lab Results:   Recent Labs  10/26/16 0703 10/27/16 0930  WBC  --  9.5  HGB 9.9* 11.7*  HCT 32.3* 37.5  PLT  --  182   BMET  Recent Labs  10/26/16 0854 10/27/16 0438  NA 139 138  K 3.4* 3.3*  CL 103 98*  CO2 28 29  GLUCOSE 222* 143*  BUN 20 16  CREATININE 0.79 0.66  CALCIUM 8.6* 8.5*   PT/INR  Recent Labs  10/26/16 0418 10/27/16 0438  LABPROT 39.8* 28.9*  INR 4.15* 2.75   CMP     Component Value Date/Time   NA 138 10/27/2016 0438   K 3.3 (L) 10/27/2016 0438   CL 98 (L) 10/27/2016 0438   CO2 29 10/27/2016 0438   GLUCOSE 143 (H) 10/27/2016 0438   BUN 16 10/27/2016 0438   CREATININE 0.66 10/27/2016 0438   CALCIUM 8.5 (L) 10/27/2016 0438   PROT 5.6 (L) 10/27/2016 0438   ALBUMIN 2.4 (L) 10/27/2016 0438   AST 69 (H) 10/27/2016 0438   ALT 61 (H) 10/27/2016 0438   ALKPHOS 63 10/27/2016 0438   BILITOT 0.5 10/27/2016 0438   GFRNONAA  >60 10/27/2016 0438   GFRAA >60 10/27/2016 0438   Lipase  No results found for: LIPASE  Anti-infectives: Anti-infectives    Start     Dose/Rate Route Frequency Ordered Stop   10/19/16 0600  piperacillin-tazobactam (ZOSYN) IVPB 2.25 g  Status:  Discontinued     2.25 g 100 mL/hr over 30 Minutes Intravenous Every 6 hours 10/18/16 2303 10/20/16 1106   10/18/16 2215  piperacillin-tazobactam (ZOSYN) IVPB 3.375 g     3.375 g 100 mL/hr over 30 Minutes Intravenous  Once 10/18/16 2214 10/18/16 2310     Assessment/Plan Small bowel obstruction -Multiple BM's starting evening of 9/8 -NG apparently removed during night 9/8 -tolerating SOFT diet -recommend continuation of daily miralax PRN at discharge to avoid constipation.              - stable for discharge to [facility] from a surgical perspective.   LOS: 9 days    Jill Alexanders , [facility] 10/27/2016, 10:25 AM Pager: [phone] Consults: [phone] Mon-Fri 7:00 am-4:30 pm Sat-Sun 7:00 am-11:30 am

## 2016-10-28 LAB — POCT INR: INR: 1.8 — AB (ref 0.9–1.1)

## 2016-10-29 ENCOUNTER — Encounter: Payer: Self-pay | Admitting: Internal Medicine

## 2016-10-29 ENCOUNTER — Non-Acute Institutional Stay (SKILLED_NURSING_FACILITY): Payer: Medicare Other | Admitting: Internal Medicine

## 2016-10-29 DIAGNOSIS — E876 Hypokalemia: Secondary | ICD-10-CM | POA: Diagnosis not present

## 2016-10-29 DIAGNOSIS — R748 Abnormal levels of other serum enzymes: Secondary | ICD-10-CM | POA: Diagnosis not present

## 2016-10-29 DIAGNOSIS — E785 Hyperlipidemia, unspecified: Secondary | ICD-10-CM

## 2016-10-29 DIAGNOSIS — R791 Abnormal coagulation profile: Secondary | ICD-10-CM

## 2016-10-29 DIAGNOSIS — R1900 Intra-abdominal and pelvic swelling, mass and lump, unspecified site: Secondary | ICD-10-CM

## 2016-10-29 DIAGNOSIS — K56609 Unspecified intestinal obstruction, unspecified as to partial versus complete obstruction: Secondary | ICD-10-CM

## 2016-10-29 DIAGNOSIS — I4891 Unspecified atrial fibrillation: Secondary | ICD-10-CM | POA: Diagnosis not present

## 2016-10-29 DIAGNOSIS — E872 Acidosis, unspecified: Secondary | ICD-10-CM

## 2016-10-29 DIAGNOSIS — N179 Acute kidney failure, unspecified: Secondary | ICD-10-CM

## 2016-10-29 DIAGNOSIS — Z7901 Long term (current) use of anticoagulants: Secondary | ICD-10-CM

## 2016-10-29 DIAGNOSIS — Z5181 Encounter for therapeutic drug level monitoring: Secondary | ICD-10-CM

## 2016-10-29 DIAGNOSIS — E119 Type 2 diabetes mellitus without complications: Secondary | ICD-10-CM | POA: Diagnosis not present

## 2016-10-29 DIAGNOSIS — I10 Essential (primary) hypertension: Secondary | ICD-10-CM

## 2016-10-29 DIAGNOSIS — M10071 Idiopathic gout, right ankle and foot: Secondary | ICD-10-CM

## 2016-10-29 NOTE — Progress Notes (Addendum)
: Provider:  Noah Delaine. Sheppard Coil, MD Location:  [place] Room Number: [place] of Service:  SNF ([phone])  PCP: [name], MD Patient Care Team: [name], MD as PCP - General [facility])  Extended Emergency Contact Information Primary Emergency Contact: Brown,Steve Address: [address]          [place], [place] [address] Johnnette Litter of [place] Phone: [phone] Mobile Phone: [phone] Relation: Son Secondary Emergency Contact: Alison Murray [place] of [place] Phone: [phone] Mobile Phone: [phone] Relation: Daughter     Allergies: Ace inhibitors; Contrast media [iodinated diagnostic agents]; and Nsaids  Chief Complaint  Patient presents with  . New Admit To SNF    following hospitalization 10/18/16 to 10/27/16 abdominal pain, small bowel obstruction.     HPI: Patient is 83 y.o. female with hypertension, A. 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 He was seen by an PCP which who initially thought it was a UTI and prescribed Macrobid for it. CT scan and lab work showed a large pelvic mass, patient was transferred to GYN/on for follow-up and GYN/on notes in their note that mass. Stable and not really changed for several years now. Patient return to the ED today because of worsening pain and abdominal distention. CT scan there showed a small bowel obstruction. Patient was admitted to [facility] from 9/3-12 for small bowel obstruction that was managed conservatively with NG tube and IV fluids. Hospital course was, K by acute kidney injury, hyperkalemia and lactic acidosis all which resolved with IV fluids as well. Hospital course was further complicated by a small rise in patient's liver functions and  patient's Lipitor was held for this reason. Patient also went into atrial fibrillation with RVR which was controlled with a diltiazem drip the patient is back on her routine medications and her Coumadin. Patient also had an acute gout flare of her right foot and knee which was treated with IV Solu-Medrol and then a prednisone taper that will continue upon discharge to skilled nursing facility. Over time patient improved and NG tube was removed, place and began to eat and having normal bowel movements and was ready for discharge to skilled nursing facility. While at skilled nursing facility patient will be followed for hypertension treated with Norvasc diltiazem losartan and metoprolol, hyperlipidemia, and treated with Lipitor, now on hold until liver functions normalize, and diabetes mellitus type 2 treated with Glucotrol.  Past Medical History:  Diagnosis Date  . A-fib ([place]) 10/19/2016  . Abdominal aortic aneurysm (AAA) without rupture ([place]) 09/15/2013  . Acute idiopathic gout of right foot   . AKI (acute kidney injury) ([place]) 10/18/2016  . Anticoagulated on Coumadin   . Arthritis   . Diabetes mellitus without complication ([place])   . Diverticular disease of large intestine 04/16/2013  . Diverticulosis   . DM2 (diabetes mellitus, type 2) ([place]) 10/19/2016  . Dysrhythmia    Atrial fib  . History of colon polyps 04/16/2013  . Hx of colonic polyps   . Hyperlipidemia   . Hypertension   . Long term current use of anticoagulant therapy 02/05/2015  . Lumbar radiculopathy 03/28/2014  . Microalbuminuria 04/16/2013  . Osteoarthritis 04/16/2013  . Osteoarthritis of cervical spine 03/28/2014  . Pelvic mass 10/19/2016  . Right renal  mass   . SBO (small bowel obstruction) ([place]) 10/18/2016  . Thrombocytopenia ([place]) 04/16/2013   Overview:  referred to hematology 01/2104    Past Surgical History:  Procedure Laterality Date  . ABDOMINAL AORTIC ANEURYSM REPAIR    . ABDOMINAL HYSTERECTOMY    . IR GENERIC HISTORICAL  07/01/2015     IR RADIOLOGIST EVAL & MGMT 07/01/2015 [facility] INTERV RAD    Allergies as of 10/29/2016      Reactions   Ace Inhibitors    Contrast Media [iodinated Diagnostic Agents]    Nsaids       Medication List       Accurate as of 10/29/16  9:39 AM. Always use your most recent med list.          acetaminophen 650 MG CR tablet Commonly known as:  TYLENOL Take 650 mg by mouth daily as needed for pain.   amLODipine 10 MG tablet Commonly known as:  NORVASC Take 10 mg by mouth daily.   CARTIA XT 120 MG 24 hr capsule Generic drug:  diltiazem Take 120 mg by mouth daily.   diclofenac sodium 1 % Gel Commonly known as:  VOLTAREN Apply 2 g topically daily as needed (KNEE PAIN).   glipiZIDE 10 MG 24 hr tablet Commonly known as:  GLUCOTROL XL Take 10 mg by mouth daily with breakfast.   losartan 100 MG tablet Commonly known as:  COZAAR Take 100 mg by mouth daily.   metoprolol tartrate 100 MG tablet Commonly known as:  LOPRESSOR Take 100 mg by mouth 2 (two) times daily.   polyethylene glycol packet Commonly known as:  MIRALAX / GLYCOLAX Take 17 g by mouth daily.   potassium chloride SA 20 MEQ tablet Commonly known as:  K-DUR,KLOR-CON Take 20 mEq by mouth 2 (two) times daily.   predniSONE 20 MG tablet Commonly known as:  DELTASONE Take 1 tablet (20 mg total) by mouth daily with breakfast.   predniSONE 10 MG tablet Commonly known as:  DELTASONE Take 1 tablet (10 mg total) by mouth daily with breakfast.   traMADol 50 MG tablet Commonly known as:  ULTRAM Take 50 mg by mouth every 8 (eight) hours as needed for moderate pain.   warfarin 2.5 MG tablet Commonly known as:  COUMADIN Take 2.5mg  daily except 1.25mg  on Tue, Thur, Sat       No orders of the defined types were placed in this encounter.   Immunization History  Administered Date(s) Administered  . PPD Test 10/27/2016    Social History  Substance Use Topics  . Smoking status: Never Smoker  . Smokeless tobacco:  Current User    Types: Snuff  . Alcohol use No    Family history is   Family History  Problem Relation Age of Onset  . Cancer Sister        Rectal and Stomach      Review of Systems  DATA OBTAINED: from patient, nurse GENERAL:  no fevers, fatigue, appetite changes SKIN: No itching, or rash EYES: No eye pain, redness, discharge EARS: No earache, tinnitus, change in hearing NOSE: No congestion, drainage or bleeding  MOUTH/THROAT: No mouth or tooth pain, No sore throat RESPIRATORY: No cough, wheezing, SOB CARDIAC: No chest pain, palpitations, lower extremity edema  GI: No abdominal pain, No N/V/D or constipation, No heartburn or reflux  GU: No dysuria, frequency or urgency, or incontinence  MUSCULOSKELETAL: No unrelieved bone/joint pain NEUROLOGIC: No headache, dizziness or focal weakness PSYCHIATRIC: No c/o anxiety or sadness  Vitals:   10/29/16 0922  BP: 129/77  Pulse: 65  Resp: 18  Temp: 98.4 F (36.9 C)    SpO2 Readings from Last 1 Encounters:  10/27/16 97%   Body mass index is 30.18 kg/m.     Physical Exam  GENERAL APPEARANCE: Alert, conversant,  No acute distress.  SKIN: No diaphoresis rash HEAD: Normocephalic, atraumatic  EYES: Conjunctiva/lids clear. Pupils round, reactive. EOMs intact.  EARS: External exam WNL, canals clear. Hearing grossly normal.  NOSE: No deformity or discharge.  MOUTH/THROAT: Lips w/o lesions  RESPIRATORY: Breathing is even, unlabored. Lung sounds are clear   CARDIOVASCULAR: HeartIrregularly irregular no murmurs, rubs or gallops. 1+ peripheral edema.   GASTROINTESTINAL: Abdomen is soft, non-tender, Mild  distended w/ normal bowel sounds. GENITOURINARY: Bladder non tender, not distended  MUSCULOSKELETAL: No abnormal joints or musculature NEUROLOGIC:  Cranial nerves 2-12 grossly intact. Moves all extremities  PSYCHIATRIC: Mood and affect appropriate to situation, no behavioral issues  Patient Active Problem List   Diagnosis  Date Noted  . Acute idiopathic gout of right foot   . A-fib ([place]) 10/19/2016  . DM2 (diabetes mellitus, type 2) ([place]) 10/19/2016  . Pelvic mass 10/19/2016  . SBO (small bowel obstruction) ([place]) 10/18/2016  . AKI (acute kidney injury) ([place]) 10/18/2016  . Long term current use of anticoagulant therapy 02/05/2015  . Right renal mass   . Lumbar radiculopathy 03/28/2014  . Osteoarthritis of cervical spine 03/28/2014  . Abdominal aortic aneurysm (AAA) without rupture ([place]) 09/15/2013  . Diverticular disease of large intestine 04/16/2013  . History of colon polyps 04/16/2013  . Microalbuminuria 04/16/2013  . Osteoarthritis 04/16/2013  . Thrombocytopenia ([place]) 04/16/2013      Labs reviewed: Basic Metabolic Panel:    Component Value Date/Time   NA 138 10/27/2016 0438   K 3.3 (L) 10/27/2016 0438   CL 98 (L) 10/27/2016 0438   CO2 29 10/27/2016 0438   GLUCOSE 143 (H) 10/27/2016 0438   BUN 16 10/27/2016 0438   CREATININE 0.66 10/27/2016 0438   CALCIUM 8.5 (L) 10/27/2016 0438   PROT 5.6 (L) 10/27/2016 0438   ALBUMIN 2.4 (L) 10/27/2016 0438   AST 69 (H) 10/27/2016 0438   ALT 61 (H) 10/27/2016 0438   ALKPHOS 63 10/27/2016 0438   BILITOT 0.5 10/27/2016 0438   GFRNONAA >60 10/27/2016 0438   GFRAA >60 10/27/2016 0438     Recent Labs  10/23/16 0640 10/24/16 0452 10/25/16 0955 10/26/16 0854 10/27/16 0438  NA 142 142 142 139 138  K 2.7* 2.8* 3.1* 3.4* 3.3*  CL 103 102 104 103 98*  CO2 30 30 29 28 29   GLUCOSE 143* 175* 123* 222* 143*  BUN 6 8 14 20 16   CREATININE 0.68 0.71 0.69 0.79 0.66  CALCIUM 8.8* 8.6* 8.6* 8.6* 8.5*  MG 1.4* 1.5* 1.9  --   --    Liver Function Tests:  Recent Labs  10/18/16 2147 10/23/16 0640 10/27/16 0438  AST 27 18 69*  ALT 13* 15 61*  ALKPHOS 62 49 63  BILITOT 1.1 0.6 0.5  PROT 6.9 5.5* 5.6*  ALBUMIN 3.4* 2.5* 2.4*   No results for input(s): LIPASE, AMYLASE in the last 8760 hours. No results for input(s): AMMONIA in the last 8760  hours. CBC:  Recent Labs  10/18/16 2147  10/20/16 0519 10/22/16 0506 10/26/16 0703 10/27/16 0930  WBC 9.6  < > 3.2* 5.9  --  9.5  NEUTROABS 7.2  --   --   --   --   --  HGB 13.4  < > 11.2* 11.5* 9.9* 11.7*  HCT 42.7  < > 35.6* 37.0 32.3* 37.5  MCV 81.8  < > 81.7 83.1  --  82.2  PLT 124*  < > 114* 136*  --  182  < > = values in this interval not displayed. Lipid No results for input(s): CHOL, HDL, LDLCALC, TRIG in the last 8760 hours.  Cardiac Enzymes: No results for input(s): CKTOTAL, CKMB, CKMBINDEX, TROPONINI in the last 8760 hours. BNP: No results for input(s): BNP in the last 8760 hours. No results found for: MICROALBUR No results found for: HGBA1C No results found for: TSH No results found for: VITAMINB12 No results found for: FOLATE No results found for: IRON, TIBC, FERRITIN  Imaging and Procedures obtained prior to SNF admission: Ct Abdomen Pelvis Wo Contrast  Result Date: 10/18/2016 CLINICAL DATA:  Worsening abdominal distention and pain for several days. Seen at another facility yesterday. EXAM: CT ABDOMEN AND PELVIS WITHOUT CONTRAST TECHNIQUE: Multidetector CT imaging of the abdomen and pelvis was performed following the standard protocol without IV contrast. COMPARISON:  10/17/2016 FINDINGS: Lower chest: Unchanged small right pleural effusion and minimal atelectatic appearing linear lung base opacities. Hepatobiliary: No focal liver abnormality is seen. No gallstones, gallbladder wall thickening, or biliary dilatation. Pancreas: Unremarkable. No pancreatic ductal dilatation or surrounding inflammatory changes. Spleen: Normal in size without focal abnormality. Adrenals/Urinary Tract: Both adrenals are unremarkable. Stable scarring of the kidneys. No suspicious renal parenchymal lesions. No hydronephrosis. No urinary calculi. Unremarkable ureters. Urinary bladder is decompressed around a Foley catheter. Stomach/Bowel: New marked dilatation of proximal and mid small bowel.  Decompressed distal small bowel. Enteric contrast persists in the small bowel from yesterday's CT, although some of the contrast has reached the transverse colon. There is mild mesenteric edema. Colon is unremarkable except for uncomplicated diverticulosis. Vascular/Lymphatic: Unchanged 6-7 cm abdominal aortic aneurysm with endovascular stent graft. No retroperitoneal hemorrhage. Reproductive: Unchanged 16 cm cystic mass in the pelvis, likely of right ovarian origin. Other: Small volume ascites, increased from yesterday. No free intraperitoneal air. Musculoskeletal: No significant skeletal lesion. IMPRESSION: 1. Small bowel obstruction, transition point probably in the low abdomen to the left of midline. 2. Small volume ascites, increased from yesterday. 3. Small right pleural effusion and minimal atelectatic lung base opacities, unchanged. 4. Unchanged abdominal aortic aneurysm with endovascular stent graft. Unchanged 16 cm cystic pelvic mass. Uncomplicated mild colonic diverticulosis. Electronically Signed   By: Andreas Newport M.D.   On: 10/18/2016 23:00   Dg Abdomen 1 View  Result Date: 10/19/2016 CLINICAL DATA:  Nasogastric tube placement EXAM: ABDOMEN - 1 VIEW COMPARISON:  CT 10/18/2016 FINDINGS: The nasogastric tube extends into the stomach, tip may be in the proximal duodenum. Persistent dilated stacked small bowel loops consistent with obstruction. No extraluminal air is evident. IMPRESSION: 1. NG tube extends well into the stomach, possibly into the proximal duodenum. 2. Obstructive gas pattern with dilated stacked small bowel loops. Electronically Signed   By: Andreas Newport M.D.   On: 10/19/2016 00:15   Dg Chest Portable 1 View  Result Date: 10/18/2016 CLINICAL DATA:  Abdominal distension for 8 days EXAM: PORTABLE CHEST 1 VIEW COMPARISON:  CT 01/13/2010 FINDINGS: Cardiomegaly with generous appearance of mediastinum. Aortic atherosclerosis. Small pleural effusions. Streaky bibasilar atelectasis  or infiltrates. Diffuse interstitial prominence. No pneumothorax. IMPRESSION: 1. Cardiomegaly with tiny pleural effusions and streaky bibasilar atelectasis or infiltrates 2. Prominent mediastinum, uncertain chronicity and likely augmented by portable technique and rotation. If mediastinal abnormality is  suspected, CT chest follow-up could be obtained for further evaluation 3. Mild diffuse interstitial opacity, suspect that this is related to chronic changes. Electronically Signed   By: Donavan Foil M.D.   On: 10/18/2016 23:06     Not all labs, radiology exams or other studies done during hospitalization come through on my EPIC note; however they are reviewed by me.    Assessment and Plan  SMALL BOWEL OBSTRUCTION-status post; resolved with conservative management of NG and IV fluids; patient is tolerating diet and passing bowel movements on discharge SNF -admitted to skilled nursing facility for OT/PT  ACUTE KIDNEY INJURY/HYPOKALEMIA/LACTIC ACIDOSIS-all resolved with IV fluids and replacement; radiology sounds shows urinary distention which resolved her Foley catheter was replaced, which is now been discontinued; lactic acid resolved as well SNF - will follow-up BMP  ATRIAL FIBRILLATION WITH RVR/ELEVATED INR-presumed treated with diltiazem drip; patient discharged on routine medicines SNF - continue metoprolol 100 mg twice a day, and diltiazem 120 mg XT daily for rate and Coumadin as prophylaxis, will be held until the INR falls below 2.7 then will be actively titrated  ACUTE GOUT-patient had a flare of gout in her right foot and right knee; was started on Solu-Medrol 40 mg every 12 hours with significant improvement and then patient was transitioned to prednisone taper which will continue for several days after her discharge to the skilled nursing facility SNF - continue steroid taper, 20 mg on 9/13 and 10 mg of prednisone on 9/14 then done  CYSTIC PELVIC MASS-unchanged 6 mm cystic pelvic mass  seen recently twice on CT scan; patient is followed up by OB/GYN  ELEVATED LIVER ENZYMES-during hospital stay there was a small rise in patient's liver enzymes, no known cause; Lipitor was stopped secondary to SNF -will follow-up liver enzymes and respond accordingly  DIABETES MELLITUS 2-was controlled by sliding scale insulin in hospital as patient was nothing by mouth SNF - will resume glipizide 10 mg daily; will check fasting blood sugars in a.m.; will add in the future meds as needed based on her blood sugars at the skilled nursing facility  HYPERTENSION SNF - controlled; continue Norvasc 10 mg daily, diltiazem 120 mg XT one daily, losartan 100 mg daily, and metoprolol 100 mg by mouth twice a day  HYPERLIPIDEMIA SNF -patient was on Lipitor 20 mg until her liver functions started to rise; will hold on Lipitor until liver functions are back to normal   Time spent greater than 45 minutes;> 50% of time with patient was spent reviewing records, labs, tests and studies, counseling and developing plan of care  Webb Silversmith D. Sheppard Coil, MD

## 2016-10-31 ENCOUNTER — Encounter: Payer: Self-pay | Admitting: Internal Medicine

## 2016-10-31 DIAGNOSIS — E872 Acidosis, unspecified: Secondary | ICD-10-CM | POA: Insufficient documentation

## 2016-10-31 DIAGNOSIS — I10 Essential (primary) hypertension: Secondary | ICD-10-CM | POA: Insufficient documentation

## 2016-10-31 DIAGNOSIS — I4891 Unspecified atrial fibrillation: Secondary | ICD-10-CM | POA: Insufficient documentation

## 2016-10-31 DIAGNOSIS — E785 Hyperlipidemia, unspecified: Secondary | ICD-10-CM | POA: Insufficient documentation

## 2016-10-31 DIAGNOSIS — R748 Abnormal levels of other serum enzymes: Secondary | ICD-10-CM | POA: Insufficient documentation

## 2016-10-31 DIAGNOSIS — R791 Abnormal coagulation profile: Secondary | ICD-10-CM | POA: Insufficient documentation

## 2016-10-31 DIAGNOSIS — Z7901 Long term (current) use of anticoagulants: Secondary | ICD-10-CM | POA: Insufficient documentation

## 2016-10-31 DIAGNOSIS — E876 Hypokalemia: Secondary | ICD-10-CM | POA: Insufficient documentation

## 2016-10-31 DIAGNOSIS — E1159 Type 2 diabetes mellitus with other circulatory complications: Secondary | ICD-10-CM | POA: Insufficient documentation

## 2016-11-01 ENCOUNTER — Non-Acute Institutional Stay (SKILLED_NURSING_FACILITY): Payer: Medicare Other | Admitting: Internal Medicine

## 2016-11-01 ENCOUNTER — Encounter: Payer: Self-pay | Admitting: Internal Medicine

## 2016-11-01 DIAGNOSIS — E876 Hypokalemia: Secondary | ICD-10-CM

## 2016-11-01 DIAGNOSIS — E785 Hyperlipidemia, unspecified: Secondary | ICD-10-CM | POA: Diagnosis not present

## 2016-11-01 DIAGNOSIS — E872 Acidosis, unspecified: Secondary | ICD-10-CM

## 2016-11-01 DIAGNOSIS — R748 Abnormal levels of other serum enzymes: Secondary | ICD-10-CM | POA: Diagnosis not present

## 2016-11-01 DIAGNOSIS — N179 Acute kidney failure, unspecified: Secondary | ICD-10-CM | POA: Diagnosis not present

## 2016-11-01 DIAGNOSIS — E875 Hyperkalemia: Secondary | ICD-10-CM | POA: Diagnosis not present

## 2016-11-01 DIAGNOSIS — K56609 Unspecified intestinal obstruction, unspecified as to partial versus complete obstruction: Secondary | ICD-10-CM | POA: Diagnosis not present

## 2016-11-01 DIAGNOSIS — I4891 Unspecified atrial fibrillation: Secondary | ICD-10-CM | POA: Diagnosis not present

## 2016-11-01 DIAGNOSIS — I10 Essential (primary) hypertension: Secondary | ICD-10-CM

## 2016-11-01 DIAGNOSIS — M10071 Idiopathic gout, right ankle and foot: Secondary | ICD-10-CM

## 2016-11-01 DIAGNOSIS — R791 Abnormal coagulation profile: Secondary | ICD-10-CM | POA: Diagnosis not present

## 2016-11-01 DIAGNOSIS — R1900 Intra-abdominal and pelvic swelling, mass and lump, unspecified site: Secondary | ICD-10-CM | POA: Diagnosis not present

## 2016-11-01 NOTE — Progress Notes (Addendum)
Location:  [place] Room Number: [place]:  SNF [phone])  Provider: Noah Delaine. Sheppard Coil, MD  PCP: [name], MD Patient Care Team: [name], MD as PCP - General [facility] Medicine)  Extended Emergency Contact Information Primary Emergency Contact: Brown,Steve Address: [address]          [place], [place] [address] Johnnette Litter of [place] Phone: [phone] Mobile Phone: [phone] Relation: Son Secondary Emergency Contact: Alison Murray [place] of [place] Phone: [phone] Mobile Phone: [phone] Relation: Daughter  Allergies  Allergen Reactions  . Ace Inhibitors   . Contrast Media [Iodinated Diagnostic Agents]   . Nsaids     Chief Complaint  Patient presents with  . Discharge Note    discharge from SNF to home    HPI:  83 y.o. 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 Hospital course was complicated by acute kidney injury hypokalemia and lactic acidosis all which resolved with IV fluids. Patient has small rise in her liver functions and patient's Lipitor was held. Patient also went into atrial fib with RVR which was controlled with diltiazem drip then patient was placed back on her oral meds. Patient also had an acute gout flare for right foot and knee which responded to IV and then by mouth steroids. Patient was admitted to skilled nursing facility for OT/PT and is now ready to be DC'd to home. Patient had her liver enzymes drawn this morning, they are not back yet, this is a not planned discharge secondary to patient request. Depending on whether the liver functions have normalized out or not patient may go back on her Lipitor.       Past Medical History:  Diagnosis Date  . A-fib ([place]) 10/19/2016  . Abdominal aortic aneurysm (AAA) without rupture ([place]) 09/15/2013  . Acute idiopathic gout of right foot   . AKI (acute kidney injury) ([place]) 10/18/2016  . Anticoagulated on Coumadin   . Arthritis   . Diabetes mellitus without complication ([place])   . Diverticular disease of large intestine 04/16/2013  . Diverticulosis   . DM2 (diabetes mellitus, type 2) ([place]) 10/19/2016  . Dysrhythmia    Atrial fib  . History of colon polyps 04/16/2013  . Hx of colonic polyps   . Hyperlipidemia   . Hypertension   . Long term current use of anticoagulant therapy 02/05/2015  . Lumbar radiculopathy 03/28/2014  . Microalbuminuria 04/16/2013  . Osteoarthritis 04/16/2013  . Osteoarthritis of cervical spine 03/28/2014  . Pelvic mass 10/19/2016  . Right renal mass   . SBO (small bowel obstruction) ([place]) 10/18/2016  . Thrombocytopenia ([place]) 04/16/2013   Overview:  referred to hematology 01/2104    Past Surgical History:  Procedure Laterality Date  . ABDOMINAL AORTIC ANEURYSM REPAIR    . ABDOMINAL HYSTERECTOMY    . IR GENERIC HISTORICAL  07/01/2015   IR RADIOLOGIST EVAL & MGMT 07/01/2015 [facility] INTERV RAD     reports that she has never smoked. Her smokeless tobacco use includes Snuff. She reports that she does not drink alcohol or use drugs. Social History   Social History  . Marital status: Married    Spouse name: N/A  . Number of children: N/A  .  Years of education: N/A   Occupational History  . retired [employer]    Social History Main Topics  . Smoking status: Never Smoker  . Smokeless tobacco: Current User    Types: Snuff  . Alcohol use No  . Drug use: No  . Sexual activity: Not on file   Other Topics Concern  . Not on file   Social History Narrative   Admitted to [facility] 10/27/16   Married   Use snuff   Alcohol none   DNR       Pertinent  Health Maintenance Due  Topic Date Due  . HEMOGLOBIN A1C   04/22/1929  . FOOT EXAM  05/11/1939  . OPHTHALMOLOGY EXAM  05/11/1939  . DEXA SCAN  05/11/1994  . PNA vac Low Risk Adult (1 of 2 - PCV13) 05/11/1994  . INFLUENZA VACCINE  09/15/2016    Medications: Allergies as of 11/01/2016      Reactions   Ace Inhibitors    Contrast Media [iodinated Diagnostic Agents]    Nsaids       Medication List       Accurate as of 11/01/16  2:57 PM. Always use your most recent med list.          acetaminophen 650 MG CR tablet Commonly known as:  TYLENOL Take 650 mg by mouth daily as needed for pain.   amLODipine 10 MG tablet Commonly known as:  NORVASC Take 10 mg by mouth daily.   CARTIA XT 120 MG 24 hr capsule Generic drug:  diltiazem Take 120 mg by mouth daily.   diclofenac sodium 1 % Gel Commonly known as:  VOLTAREN Apply 2 g topically daily as needed (KNEE PAIN).   glipiZIDE 10 MG 24 hr tablet Commonly known as:  GLUCOTROL XL Take 10 mg by mouth daily with breakfast.   losartan 100 MG tablet Commonly known as:  COZAAR Take 100 mg by mouth daily.   metoprolol tartrate 100 MG tablet Commonly known as:  LOPRESSOR Take 100 mg by mouth 2 (two) times daily.   polyethylene glycol packet Commonly known as:  MIRALAX / GLYCOLAX Take 17 g by mouth daily.   potassium chloride SA 20 MEQ tablet Commonly known as:  K-DUR,KLOR-CON Take 20 mEq by mouth 2 (two) times daily.   traMADol 50 MG tablet Commonly known as:  ULTRAM Take 50 mg by mouth every 8 (eight) hours as needed for moderate pain.   warfarin 2.5 MG tablet Commonly known as:  COUMADIN Take 2.5 mg by mouth daily. 2.5 mg daily except for Tuesday Thursday Saturday patient takes one half tablet (1.25 mg)            Discharge Care Instructions        Start     Ordered   11/01/16 0000  POCT INR    Comments:  This external order was created through the Results Console.    11/01/16 1336       Vitals:   11/01/16 1328  BP: 109/68  Pulse: 62  Resp: 18  Temp: 98.4 F  (36.9 C)  SpO2: 97%  Weight: 172 lb (78 kg)  Height: 5\' 2"  (1.575 m)   Body mass index is 31.46 kg/m.  Physical Exam  GENERAL APPEARANCE: Alert, conversant. No acute distress.  HEENT: Unremarkable. RESPIRATORY: Breathing is even, unlabored. Lung sounds are clear   CARDIOVASCULAR: Heart RRR no murmurs, rubs or gallops. No peripheral edema.  GASTROINTESTINAL: Abdomen is soft, non-tender, not distended  w/ normal bowel sounds.  NEUROLOGIC: Cranial nerves 2-12 grossly intact. Moves all extremities   Labs reviewed: Basic Metabolic Panel:  Recent Labs  10/23/16 0640 10/24/16 0452 10/25/16 0955 10/26/16 0854 10/27/16 0438  NA 142 142 142 139 138  K 2.7* 2.8* 3.1* 3.4* 3.3*  CL 103 102 104 103 98*  CO2 30 30 29 28 29   GLUCOSE 143* 175* 123* 222* 143*  BUN 6 8 14 20 16   CREATININE 0.68 0.71 0.69 0.79 0.66  CALCIUM 8.8* 8.6* 8.6* 8.6* 8.5*  MG 1.4* 1.5* 1.9  --   --    No results found for: [facility] Liver Function Tests:  Recent Labs  10/18/16 2147 10/23/16 0640 10/27/16 0438  AST 27 18 69*  ALT 13* 15 61*  ALKPHOS 62 49 63  BILITOT 1.1 0.6 0.5  PROT 6.9 5.5* 5.6*  ALBUMIN 3.4* 2.5* 2.4*   No results for input(s): LIPASE, AMYLASE in the last 8760 hours. No results for input(s): AMMONIA in the last 8760 hours. CBC:  Recent Labs  10/18/16 2147  10/20/16 0519 10/22/16 0506 10/26/16 0703 10/27/16 0930  WBC 9.6  < > 3.2* 5.9  --  9.5  NEUTROABS 7.2  --   --   --   --   --   HGB 13.4  < > 11.2* 11.5* 9.9* 11.7*  HCT 42.7  < > 35.6* 37.0 32.3* 37.5  MCV 81.8  < > 81.7 83.1  --  82.2  PLT 124*  < > 114* 136*  --  182  < > = values in this interval not displayed. Lipid No results for input(s): CHOL, HDL, LDLCALC, TRIG in the last 8760 hours. Cardiac Enzymes: No results for input(s): CKTOTAL, CKMB, CKMBINDEX, TROPONINI in the last 8760 hours. BNP: No results for input(s): BNP in the last 8760 hours. CBG:  Recent Labs  10/27/16 0337 10/27/16 0732  10/27/16 1200  GLUCAP 129* 117* 177*    Procedures and Imaging Studies During Stay: Ct Abdomen Pelvis Wo Contrast  Result Date: 10/18/2016 CLINICAL DATA:  Worsening abdominal distention and pain for several days. Seen at another facility yesterday. EXAM: CT ABDOMEN AND PELVIS WITHOUT CONTRAST TECHNIQUE: Multidetector CT imaging of the abdomen and pelvis was performed following the standard protocol without IV contrast. COMPARISON:  10/17/2016 FINDINGS: Lower chest: Unchanged small right pleural effusion and minimal atelectatic appearing linear lung base opacities. Hepatobiliary: No focal liver abnormality is seen. No gallstones, gallbladder wall thickening, or biliary dilatation. Pancreas: Unremarkable. No pancreatic ductal dilatation or surrounding inflammatory changes. Spleen: Normal in size without focal abnormality. Adrenals/Urinary Tract: Both adrenals are unremarkable. Stable scarring of the kidneys. No suspicious renal parenchymal lesions. No hydronephrosis. No urinary calculi. Unremarkable ureters. Urinary bladder is decompressed around a Foley catheter. Stomach/Bowel: New marked dilatation of proximal and mid small bowel. Decompressed distal small bowel. Enteric contrast persists in the small bowel from yesterday's CT, although some of the contrast has reached the transverse colon. There is mild mesenteric edema. Colon is unremarkable except for uncomplicated diverticulosis. Vascular/Lymphatic: Unchanged 6-7 cm abdominal aortic aneurysm with endovascular stent graft. No retroperitoneal hemorrhage. Reproductive: Unchanged 16 cm cystic mass in the pelvis, likely of right ovarian origin. Other: Small volume ascites, increased from yesterday. No free intraperitoneal air. Musculoskeletal: No significant skeletal lesion. IMPRESSION: 1. Small bowel obstruction, transition point probably in the low abdomen to the left of midline. 2. Small volume ascites, increased from yesterday. 3. Small right pleural  effusion and minimal atelectatic lung base opacities, unchanged.  4. Unchanged abdominal aortic aneurysm with endovascular stent graft. Unchanged 16 cm cystic pelvic mass. Uncomplicated mild colonic diverticulosis. Electronically Signed   By: Andreas Newport M.D.   On: 10/18/2016 23:00   Dg Abdomen 1 View  Result Date: 10/19/2016 CLINICAL DATA:  Nasogastric tube placement EXAM: ABDOMEN - 1 VIEW COMPARISON:  CT 10/18/2016 FINDINGS: The nasogastric tube extends into the stomach, tip may be in the proximal duodenum. Persistent dilated stacked small bowel loops consistent with obstruction. No extraluminal air is evident. IMPRESSION: 1. NG tube extends well into the stomach, possibly into the proximal duodenum. 2. Obstructive gas pattern with dilated stacked small bowel loops. Electronically Signed   By: Andreas Newport M.D.   On: 10/19/2016 00:15   US Renal  Result Date: 10/20/2016 CLINICAL DATA:  Acute kidney injury. EXAM: RENAL / URINARY TRACT ULTRASOUND COMPLETE COMPARISON:  Abdomen and pelvis CT dated 10/18/2016. FINDINGS: Right Kidney: Length: 10.9 cm. Mildly echogenic. Diffuse parenchymal thinning and prominent renal sinus fat. No hydronephrosis. Left Kidney: Length: 11.9 cm. Mildly echogenic. Diffuse parenchymal thinning and prominent renal sinus fat. 2.2 cm mid renal cyst. There is also a 1.4 cm upper pole cyst with internal echoes. No hydronephrosis. Bladder: Dilated urinary bladder with a calculated volume of 1075 cc. Small inferior bladder diverticulum. IMPRESSION: 1. Dilated urinary bladder with a small inferior diverticulum. 2. Mildly echogenic kidneys compatible with medical renal disease. 3. Moderate bilateral renal parenchymal atrophy. 4. No hydronephrosis. Electronically Signed   By: Claudie Revering M.D.   On: 10/20/2016 11:46   Dg Chest Portable 1 View  Result Date: 10/18/2016 CLINICAL DATA:  Abdominal distension for 8 days EXAM: PORTABLE CHEST 1 VIEW COMPARISON:  CT 01/13/2010 FINDINGS:  Cardiomegaly with generous appearance of mediastinum. Aortic atherosclerosis. Small pleural effusions. Streaky bibasilar atelectasis or infiltrates. Diffuse interstitial prominence. No pneumothorax. IMPRESSION: 1. Cardiomegaly with tiny pleural effusions and streaky bibasilar atelectasis or infiltrates 2. Prominent mediastinum, uncertain chronicity and likely augmented by portable technique and rotation. If mediastinal abnormality is suspected, CT chest follow-up could be obtained for further evaluation 3. Mild diffuse interstitial opacity, suspect that this is related to chronic changes. Electronically Signed   By: Donavan Foil M.D.   On: 10/18/2016 23:06   Dg Abd Portable 1v  Result Date: 10/22/2016 CLINICAL DATA:  Small-bowel obstruction EXAM: PORTABLE ABDOMEN - 1 VIEW COMPARISON:  Pain 07/2016 FINDINGS: Similar mild gas distention of the small bowel but there is air and contrast throughout the colon. Appearance more compatible with resolving obstruction or residual ileus. Diverticulosis noted. Aortic aneurysm endograft repair evident. NG tube in the stomach. Degenerative changes of the spine osteopenia. IMPRESSION: Similar gas distention of small bowel but with air and contrast in the colon, more compatible with resolving obstruction or mild ileus. Electronically Signed   By: Jerilynn Mages.  Shick M.D.   On: 10/22/2016 09:35   Dg Abd Portable 1v  Result Date: 10/21/2016 CLINICAL DATA:  Small bowel obstruction, followup EXAM: PORTABLE ABDOMEN - 1 VIEW COMPARISON:  Portable exam 1736 hours compared to 0455 hours FINDINGS: Tip of nasogastric tube projects over stomach. Aortoiliac stent. Retained contrast in colon, which appears decompressed. Distal colonic diverticulosis noted. Dilated small bowel loops in mid abdomen, little changed. No definite bowel wall thickening. Bones demineralized. IMPRESSION: Persistent small while distension little changed from earlier study. Electronically Signed   By: Lavonia Dana M.D.   On:  10/21/2016 18:00   Dg Abd Portable 1v  Result Date: 10/21/2016 CLINICAL DATA:  Follow-up small bowel obstruction  EXAM: PORTABLE ABDOMEN - 1 VIEW COMPARISON:  Abdominal radiograph of October 20, 2016 FINDINGS: There remain loops of moderately distended gas-filled small bowel in the mid and left abdomen. There is contrast within the colon from the ascending colon to the rectum. The esophagogastric tubes proximal port lies at or above the GE junction with the tip in the cardia. There is an aorto bi-iliac stent graft. IMPRESSION: Persistent small bowel obstruction with perhaps slightly decreased gaseous distention of small-bowel loops. No free extraluminal gas collections are observed. Advancement of the nasogastric tube by 10 cm is recommended to assure that the proximal port is positioned below the GE junction. Electronically Signed   By: David  Martinique M.D.   On: 10/21/2016 07:08   Dg Abd Portable 1v  Result Date: 10/20/2016 CLINICAL DATA:  NG tube placement EXAM: PORTABLE ABDOMEN - 1 VIEW COMPARISON:  10/20/2016 FINDINGS: NG tube has been advanced slightly with the tip in the proximal stomach. The side-port is likely near the GE junction. Continued gaseous distention of bowel. IMPRESSION: NG tube slightly advanced with the tip in the proximal stomach. The side port appears to be near the GE junction. Electronically Signed   By: Rolm Baptise M.D.   On: 10/20/2016 11:36   Dg Abd Portable 1v  Result Date: 10/20/2016 CLINICAL DATA:  Small bowel obstruction, follow-up EXAM: PORTABLE ABDOMEN - 1 VIEW COMPARISON:  10/18/2016 abdominal radiograph FINDINGS: Aorto bi-iliac stent graft is in place. Enteric tube terminates at the esophagogastric junction. There are mildly to moderately dilated small bowel loops in the central abdomen bilaterally measuring up to the 4.9 cm diameter, decreased in the interval, previously 6.9 cm diameter. Retained oral contrast in the large bowel. Mild-to-moderate colonic gas and stool.  No evidence of pneumatosis or pneumoperitoneum. No radiopaque urolithiasis. Stable hazy right lung base opacities. IMPRESSION: 1. Enteric tube terminates at the esophagogastric junction, consider advancing 6-8 cm. 2. Persistent mild-to-moderate central small bowel dilatation, decreased in the interval, suggesting improving distal small bowel obstruction. These results will be called to the ordering clinician or representative by the Radiologist Assistant, and communication documented in the PACS or zVision Dashboard. Electronically Signed   By: Ilona Sorrel M.D.   On: 10/20/2016 08:34   Dg Abd Portable 2v  Result Date: 10/23/2016 CLINICAL DATA:  Nausea, vomiting, abdominal pain. EXAM: PORTABLE ABDOMEN - 2 VIEW COMPARISON:  None. FINDINGS: Nasogastric tube with the tip projecting over the stomach, but recommend advancing the tube 8 cm. The bowel gas pattern is normal. There is oral contrast material in the colon. There is no evidence of free air. No radio-opaque calculi or other significant radiographic abnormality is seen. IMPRESSION: 1. Nasogastric tube with the tip projecting over the stomach, but recommend advancing the tube 8 cm. 2. No evidence bowel obstruction. Small amount contrast in the colon with mild gaseous distension of small bowel and colon likely reflecting resolving ileus. Electronically Signed   By: Kathreen Devoid   On: 10/23/2016 10:02    Assessment/Plan:   SBO (small bowel obstruction) (HCC)  AKI (acute kidney injury) ([place])  Hypokalemia  Lactic acidosis  Elevated INR  Acute idiopathic gout of right foot  Atrial fibrillation with RVR ([place])  Essential hypertension  Elevated liver enzymes  Hyperlipidemia, unspecified hyperlipidemia type  Pelvic mass   Patient is being discharged with the following home health services:  OT/PT/nursing  Patient is being discharged with the following durable medical equipment:  None  Patient has been advised to f/u with their PCP  in 1-2  weeks to bring them up to date on their rehab stay.  Social services at facility was responsible for arranging this appointment.  Pt was provided with a 30 day supply of prescriptions for medications and refills must be obtained from their PCP.  For controlled substances, a more limited supply may be provided adequate until PCP appointment only.  Future labs/tests needed: INR/PT on 9/20; results to PCP; may wish to try a BMP that day as well, see below Also, patient's five-day labs were done today and her potassium was 6.0. Therefore we'll DC K-Dur 20 milliequivalents by mouth twice a day, she is not on a diuretic, and administer Kayexalate 30 g by mouth now before she is discharged.  Medications have been reconciled.  Time spent greater than 30 minutes;> 50% of time with patient was spent reviewing records, labs, tests and studies, counseling and developing plan of care  Noah Delaine. Sheppard Coil, MD

## 2016-11-10 ENCOUNTER — Telehealth: Payer: Self-pay | Admitting: *Deleted

## 2016-11-10 NOTE — Telephone Encounter (Signed)
Received fax from [facility] with patient's INR Results. Faxed back to [place] stating to fax to PCP and also called [facility] [phone] and spoke with [facility] and informed her we were no longer seeing patient because she was discharged from [facility] and she would need to send lab to patient's PCP. Lab placed in shredder.

## 2017-03-02 ENCOUNTER — Encounter: Payer: Self-pay | Admitting: Family Medicine

## 2017-03-02 ENCOUNTER — Ambulatory Visit (INDEPENDENT_AMBULATORY_CARE_PROVIDER_SITE_OTHER): Payer: Medicare Other | Admitting: Family Medicine

## 2017-03-02 VITALS — BP 118/74 | HR 68 | Ht 62.0 in | Wt 161.4 lb

## 2017-03-02 DIAGNOSIS — E119 Type 2 diabetes mellitus without complications: Secondary | ICD-10-CM | POA: Diagnosis not present

## 2017-03-02 DIAGNOSIS — I482 Chronic atrial fibrillation, unspecified: Secondary | ICD-10-CM

## 2017-03-02 DIAGNOSIS — D696 Thrombocytopenia, unspecified: Secondary | ICD-10-CM | POA: Diagnosis not present

## 2017-03-02 DIAGNOSIS — I10 Essential (primary) hypertension: Secondary | ICD-10-CM | POA: Diagnosis not present

## 2017-03-02 LAB — COMPREHENSIVE METABOLIC PANEL
ALT: 42 U/L — AB (ref 0–35)
AST: 32 U/L (ref 0–37)
Albumin: 4.4 g/dL (ref 3.5–5.2)
Alkaline Phosphatase: 76 U/L (ref 39–117)
BUN: 27 mg/dL — AB (ref 6–23)
CHLORIDE: 109 meq/L (ref 96–112)
CO2: 22 mEq/L (ref 19–32)
CREATININE: 1.3 mg/dL — AB (ref 0.40–1.20)
Calcium: 10.2 mg/dL (ref 8.4–10.5)
GFR: 41.1 mL/min — ABNORMAL LOW (ref >60.00)
Glucose, Bld: 118 mg/dL — ABNORMAL HIGH (ref 70–99)
Potassium: 4.9 mEq/L (ref 3.5–5.1)
SODIUM: 141 meq/L (ref 135–145)
Total Bilirubin: 0.4 mg/dL (ref 0.2–1.2)
Total Protein: 7.1 g/dL (ref 6.0–8.3)

## 2017-03-02 LAB — MICROALBUMIN / CREATININE URINE RATIO
Creatinine,U: 219.7 mg/dL
MICROALB/CREAT RATIO: 16.2 mg/g (ref 0.0–30.0)
Microalb, Ur: 35.7 mg/dL — ABNORMAL HIGH (ref 0.0–1.9)

## 2017-03-02 LAB — URINALYSIS, ROUTINE W REFLEX MICROSCOPIC
Bilirubin Urine: NEGATIVE
Hgb urine dipstick: NEGATIVE
NITRITE: NEGATIVE
PH: 5.5 (ref 5.0–8.0)
Specific Gravity, Urine: >=1.03 — AB (ref 1.000–1.030)
Total Protein, Urine: 30 — AB
URINE GLUCOSE: NEGATIVE
Urobilinogen, UA: 0.2 (ref 0.0–1.0)

## 2017-03-02 LAB — CBC
HCT: 39.5 % (ref 36.0–46.0)
Hemoglobin: 12.3 g/dL (ref 12.0–15.0)
MCHC: 31 g/dL (ref 30.0–36.0)
MCV: 83.4 fl (ref 78.0–100.0)
Platelets: 88 10*3/uL — ABNORMAL LOW (ref 150.0–400.0)
RBC: 4.73 Mil/uL (ref 3.87–5.11)
RDW: 15.8 % — ABNORMAL HIGH (ref 11.5–15.5)
WBC: 4.3 10*3/uL (ref 4.0–10.5)

## 2017-03-02 LAB — TSH: TSH: 3.26 u[IU]/mL (ref 0.35–4.50)

## 2017-03-02 LAB — HEMOGLOBIN A1C: HEMOGLOBIN A1C: 6.2 % (ref 4.6–6.5)

## 2017-03-02 MED ORDER — METOPROLOL TARTRATE 100 MG PO TABS: 100.0000 mg | ORAL_TABLET | Freq: Two times a day (BID) | ORAL | 1 refills | Status: DC

## 2017-03-02 MED ORDER — AMLODIPINE BESYLATE 10 MG PO TABS: 10.0000 mg | ORAL_TABLET | Freq: Every day | ORAL | 1 refills | Status: DC

## 2017-03-02 MED ORDER — GLIPIZIDE ER 10 MG PO TB24: 10.0000 mg | ORAL_TABLET | Freq: Every day | ORAL | 1 refills | Status: DC

## 2017-03-02 MED ORDER — LOSARTAN POTASSIUM 100 MG PO TABS: 100.0000 mg | ORAL_TABLET | Freq: Every day | ORAL | 1 refills | Status: DC

## 2017-03-02 NOTE — Progress Notes (Addendum)
Subjective:  Patient ID: Chelsea Dean, female    DOB: 12-14-1927  Age: 84 y.o. MRN: [id number]  CC: Medication Refill   HPI Chelsea Dean presents for to establish care and for follow-up of her hypertension, atrial fibrillation and diabetes.  These have been well controlled on her current medical regimen.  She has been doing well with these medicines.  She is concerned about her husband's inability to sleep.  She is currently followed at the [facility] clinic and her INR was found to be in range 2 weeks ago.  They will eventually be followed by our Coumadin clinic over here.  Outpatient Medications Prior to Visit  Medication Sig Dispense Refill  . acetaminophen (TYLENOL) 650 MG CR tablet Take 650 mg by mouth daily as needed for pain.    Marland Kitchen diclofenac sodium (VOLTAREN) 1 % GEL Apply 2 g topically daily as needed (KNEE PAIN).    Marland Kitchen polyethylene glycol (MIRALAX / GLYCOLAX) packet Take 17 g by mouth daily. 14 each 0  . traMADol (ULTRAM) 50 MG tablet Take 50 mg by mouth every 8 (eight) hours as needed for moderate pain.     Marland Kitchen warfarin (COUMADIN) 2.5 MG tablet Take 2.5 mg by mouth daily. 2.5 mg daily except for Tuesday Thursday Saturday patient takes one half tablet (1.25 mg)    . amLODipine (NORVASC) 10 MG tablet Take 10 mg by mouth daily.    Marland Kitchen atorvastatin (LIPITOR) 20 MG tablet Take 1 tablet by mouth daily.    Marland Kitchen diltiazem (CARTIA XT) 120 MG 24 hr capsule Take 120 mg by mouth daily.    Marland Kitchen glipiZIDE (GLUCOTROL XL) 10 MG 24 hr tablet Take 10 mg by mouth daily with breakfast.    . losartan (COZAAR) 100 MG tablet Take 100 mg by mouth daily.  0  . metoprolol tartrate (LOPRESSOR) 100 MG tablet Take 100 mg by mouth 2 (two) times daily.     . potassium chloride SA (K-DUR,KLOR-CON) 20 MEQ tablet Take 20 mEq by mouth 2 (two) times daily.     No facility-administered medications prior to visit.     ROS Review of Systems  Constitutional: Negative.   HENT: Negative.   Eyes: Negative.   Respiratory:  Negative.   Cardiovascular: Negative for chest pain.  Gastrointestinal: Negative.  Negative for anal bleeding and blood in stool.  Endocrine: Negative for polyphagia and polyuria.  Genitourinary: Negative for frequency and hematuria.  Skin: Negative for pallor and rash.  Neurological: Negative for weakness and headaches.  Hematological: Does not bruise/bleed easily.  Psychiatric/Behavioral: Negative.     Objective:  BP 118/74 (BP Location: Left Arm, Patient Position: Sitting, Cuff Size: Normal)   Pulse 68   Ht 5\' 2"  (1.575 m)   Wt 161 lb 6 oz (73.2 kg)   SpO2 98%   BMI 29.52 kg/m   BP Readings from Last 3 Encounters:  03/02/17 118/74  11/01/16 109/68  10/29/16 129/77    Wt Readings from Last 3 Encounters:  03/02/17 161 lb 6 oz (73.2 kg)  11/01/16 172 lb (78 kg)  10/29/16 165 lb (74.8 kg)    Physical Exam  Constitutional: She is oriented to person, place, and time. She appears well-developed and well-nourished. No distress.  HENT:  Head: Normocephalic and atraumatic.  Right Ear: External ear normal.  Left Ear: External ear normal.  Mouth/Throat: Oropharynx is clear and moist. No oropharyngeal exudate.  Eyes: Conjunctivae are normal. Pupils are equal, round, and reactive to light. Right eye exhibits no discharge.  Left eye exhibits no discharge. No scleral icterus.  Neck: Neck supple. No JVD present. No tracheal deviation present. No thyromegaly present.  Cardiovascular: An irregularly irregular rhythm present.  Pulmonary/Chest: Effort normal and breath sounds normal. No stridor.  Lymphadenopathy:    She has no cervical adenopathy.  Neurological: She is alert and oriented to person, place, and time.  Skin: Skin is warm and dry. She is not diaphoretic.  Psychiatric: She has a normal mood and affect. Her behavior is normal.    Lab Results  Component Value Date   WBC 4.3 03/02/2017   HGB 12.3 03/02/2017   HCT 39.5 03/02/2017   PLT 88.0 (L) 03/02/2017   GLUCOSE 118  (H) 03/02/2017   ALT 42 (H) 03/02/2017   AST 32 03/02/2017   NA 141 03/02/2017   K 4.9 03/02/2017   CL 109 03/02/2017   CREATININE 1.30 (H) 03/02/2017   BUN 27 (H) 03/02/2017   CO2 22 03/02/2017   TSH 3.26 03/02/2017   INR 1.8 (A) 10/28/2016   HGBA1C 6.2 03/02/2017   MICROALBUR 35.7 (H) 03/02/2017    Ct Abdomen Pelvis Wo Contrast  Result Date: 10/18/2016 CLINICAL DATA:  Worsening abdominal distention and pain for several days. Seen at another facility yesterday. EXAM: CT ABDOMEN AND PELVIS WITHOUT CONTRAST TECHNIQUE: Multidetector CT imaging of the abdomen and pelvis was performed following the standard protocol without IV contrast. COMPARISON:  10/17/2016 FINDINGS: Lower chest: Unchanged small right pleural effusion and minimal atelectatic appearing linear lung base opacities. Hepatobiliary: No focal liver abnormality is seen. No gallstones, gallbladder wall thickening, or biliary dilatation. Pancreas: Unremarkable. No pancreatic ductal dilatation or surrounding inflammatory changes. Spleen: Normal in size without focal abnormality. Adrenals/Urinary Tract: Both adrenals are unremarkable. Stable scarring of the kidneys. No suspicious renal parenchymal lesions. No hydronephrosis. No urinary calculi. Unremarkable ureters. Urinary bladder is decompressed around a Foley catheter. Stomach/Bowel: New marked dilatation of proximal and mid small bowel. Decompressed distal small bowel. Enteric contrast persists in the small bowel from yesterday's CT, although some of the contrast has reached the transverse colon. There is mild mesenteric edema. Colon is unremarkable except for uncomplicated diverticulosis. Vascular/Lymphatic: Unchanged 6-7 cm abdominal aortic aneurysm with endovascular stent graft. No retroperitoneal hemorrhage. Reproductive: Unchanged 16 cm cystic mass in the pelvis, likely of right ovarian origin. Other: Small volume ascites, increased from yesterday. No free intraperitoneal air.  Musculoskeletal: No significant skeletal lesion. IMPRESSION: 1. Small bowel obstruction, transition point probably in the low abdomen to the left of midline. 2. Small volume ascites, increased from yesterday. 3. Small right pleural effusion and minimal atelectatic lung base opacities, unchanged. 4. Unchanged abdominal aortic aneurysm with endovascular stent graft. Unchanged 16 cm cystic pelvic mass. Uncomplicated mild colonic diverticulosis. Electronically Signed   By: Andreas Newport M.D.   On: 10/18/2016 23:00   Dg Abdomen 1 View  Result Date: 10/19/2016 CLINICAL DATA:  Nasogastric tube placement EXAM: ABDOMEN - 1 VIEW COMPARISON:  CT 10/18/2016 FINDINGS: The nasogastric tube extends into the stomach, tip may be in the proximal duodenum. Persistent dilated stacked small bowel loops consistent with obstruction. No extraluminal air is evident. IMPRESSION: 1. NG tube extends well into the stomach, possibly into the proximal duodenum. 2. Obstructive gas pattern with dilated stacked small bowel loops. Electronically Signed   By: Andreas Newport M.D.   On: 10/19/2016 00:15   Dg Chest Portable 1 View  Result Date: 10/18/2016 CLINICAL DATA:  Abdominal distension for 8 days EXAM: PORTABLE CHEST 1 VIEW COMPARISON:  CT 01/13/2010 FINDINGS: Cardiomegaly with generous appearance of mediastinum. Aortic atherosclerosis. Small pleural effusions. Streaky bibasilar atelectasis or infiltrates. Diffuse interstitial prominence. No pneumothorax. IMPRESSION: 1. Cardiomegaly with tiny pleural effusions and streaky bibasilar atelectasis or infiltrates 2. Prominent mediastinum, uncertain chronicity and likely augmented by portable technique and rotation. If mediastinal abnormality is suspected, CT chest follow-up could be obtained for further evaluation 3. Mild diffuse interstitial opacity, suspect that this is related to chronic changes. Electronically Signed   By: Donavan Foil M.D.   On: 10/18/2016 23:06    Assessment &  Plan:   Amara was seen today for medication refill.  Diagnoses and all orders for this visit:  Chronic atrial fibrillation (HCC) -     metoprolol tartrate (LOPRESSOR) 100 MG tablet; Take 1 tablet (100 mg total) by mouth 2 (two) times daily. -     TSH  Essential hypertension -     amLODipine (NORVASC) 10 MG tablet; Take 1 tablet (10 mg total) by mouth daily. -     losartan (COZAAR) 100 MG tablet; Take 1 tablet (100 mg total) by mouth daily. -     metoprolol tartrate (LOPRESSOR) 100 MG tablet; Take 1 tablet (100 mg total) by mouth 2 (two) times daily. -     CBC -     Comprehensive metabolic panel -     TSH -     Urinalysis, Routine w reflex microscopic  Type 2 diabetes mellitus without complication, without long-term current use of insulin (HCC) -     glipiZIDE (GLUCOTROL XL) 10 MG 24 hr tablet; Take 1 tablet (10 mg total) by mouth daily with breakfast. -     losartan (COZAAR) 100 MG tablet; Take 1 tablet (100 mg total) by mouth daily. -     CBC -     Comprehensive metabolic panel -     Hemoglobin A1c -     Microalbumin / creatinine urine ratio -     Urinalysis, Routine w reflex microscopic  Thrombocytopenia (HCC)   I have discontinued Chelsea Dean's potassium chloride SA, diltiazem, and atorvastatin. I have also changed her amLODipine, glipiZIDE, losartan, and metoprolol tartrate. Additionally, I am having her maintain her traMADol, diclofenac sodium, acetaminophen, polyethylene glycol, and warfarin.   Patient is medicines were refilled as above.  Expressed concern about Glucotrol again today and how important it is for her to make certain that she eats after taking it.  She has done okay with this medicine for quite some time now.  She has been taking potassium supplementation because her potassium was found to be low 3 months ago.  She is currently on no medicines that would lower her potassium.  He will be measured again today and will restart her potassium pending hypokalemia  shown in the blood work today.  Meds ordered this encounter  Medications  . amLODipine (NORVASC) 10 MG tablet    Sig: Take 1 tablet (10 mg total) by mouth daily.    Dispense:  90 tablet    Refill:  1  . glipiZIDE (GLUCOTROL XL) 10 MG 24 hr tablet    Sig: Take 1 tablet (10 mg total) by mouth daily with breakfast.    Dispense:  90 tablet    Refill:  1  . losartan (COZAAR) 100 MG tablet    Sig: Take 1 tablet (100 mg total) by mouth daily.    Dispense:  90 tablet    Refill:  1  . metoprolol tartrate (LOPRESSOR) 100 MG tablet  Sig: Take 1 tablet (100 mg total) by mouth 2 (two) times daily.    Dispense:  180 tablet    Refill:  1     Follow-up: Return in about 3 months (around 05/31/2017).  [name], MD

## 2017-03-16 ENCOUNTER — Encounter: Payer: Self-pay | Admitting: Family Medicine

## 2017-03-16 ENCOUNTER — Ambulatory Visit (INDEPENDENT_AMBULATORY_CARE_PROVIDER_SITE_OTHER): Payer: Medicare Other | Admitting: Family Medicine

## 2017-03-16 VITALS — BP 110/70 | HR 81 | Ht 62.0 in | Wt 165.5 lb

## 2017-03-16 DIAGNOSIS — R8290 Unspecified abnormal findings in urine: Secondary | ICD-10-CM

## 2017-03-16 DIAGNOSIS — D696 Thrombocytopenia, unspecified: Secondary | ICD-10-CM

## 2017-03-16 DIAGNOSIS — E119 Type 2 diabetes mellitus without complications: Secondary | ICD-10-CM

## 2017-03-16 NOTE — Progress Notes (Addendum)
Subjective:  Patient ID: Chelsea Dean, female    DOB: December 03, 1927  Age: 84 y.o. MRN: [id number]  CC: Follow-up   HPI Chelsea Dean presents for follow-up of her diabetes, thrombocytopenia and abnormal UA.  She is having no symptoms referrable to the urinary tract.  Her platelet count at last check was 84.  She has a history of thrombocytopenia and is status post extensive workup for this that was negative.  It is her daily habit to have a full breakfast after she takes her glipizide.  Outpatient Medications Prior to Visit  Medication Sig Dispense Refill  . acetaminophen (TYLENOL) 650 MG CR tablet Take 650 mg by mouth daily as needed for pain.    Marland Kitchen amLODipine (NORVASC) 10 MG tablet Take 1 tablet (10 mg total) by mouth daily. 90 tablet 1  . diclofenac sodium (VOLTAREN) 1 % GEL Apply 2 g topically daily as needed (KNEE PAIN).    Marland Kitchen glipiZIDE (GLUCOTROL XL) 10 MG 24 hr tablet Take 1 tablet (10 mg total) by mouth daily with breakfast. 90 tablet 1  . losartan (COZAAR) 100 MG tablet Take 1 tablet (100 mg total) by mouth daily. 90 tablet 1  . metoprolol tartrate (LOPRESSOR) 100 MG tablet Take 1 tablet (100 mg total) by mouth 2 (two) times daily. 180 tablet 1  . polyethylene glycol (MIRALAX / GLYCOLAX) packet Take 17 g by mouth daily. 14 each 0  . traMADol (ULTRAM) 50 MG tablet Take 50 mg by mouth every 8 (eight) hours as needed for moderate pain.     Marland Kitchen warfarin (COUMADIN) 2.5 MG tablet Take 2.5 mg by mouth daily. 2.5 mg daily except for Tuesday Thursday Saturday patient takes one half tablet (1.25 mg)     No facility-administered medications prior to visit.     ROS Review of Systems  Constitutional: Negative for chills, fatigue and fever.  Gastrointestinal: Negative for anal bleeding and blood in stool.  Genitourinary: Negative for decreased urine volume, difficulty urinating, dysuria, frequency and hematuria.  Hematological: Does not bruise/bleed easily.    Objective:  BP 110/70 (BP  Location: Left Arm, Patient Position: Sitting, Cuff Size: Normal)   Pulse 81   Ht 5\' 2"  (1.575 m)   Wt 165 lb 8 oz (75.1 kg)   SpO2 97%   BMI 30.27 kg/m   BP Readings from Last 3 Encounters:  03/20/17 136/65  03/16/17 110/70  03/02/17 118/74    Wt Readings from Last 3 Encounters:  03/16/17 165 lb 8 oz (75.1 kg)  03/02/17 161 lb 6 oz (73.2 kg)  11/01/16 172 lb (78 kg)    Physical Exam  Constitutional: She is oriented to person, place, and time. She appears well-developed and well-nourished. No distress.  HENT:  Head: Normocephalic and atraumatic.  Right Ear: External ear normal.  Left Ear: External ear normal.  Eyes: Right eye exhibits no discharge. Left eye exhibits no discharge. No scleral icterus.  Neck: Neck supple. No JVD present. No tracheal deviation present.  Pulmonary/Chest: Effort normal. No stridor.  Neurological: She is alert and oriented to person, place, and time.  Skin: Skin is warm and dry. She is not diaphoretic.  Psychiatric: She has a normal mood and affect. Her behavior is normal.    Lab Results  Component Value Date   WBC 5.9 03/20/2017   HGB 12.0 03/20/2017   HCT 38.4 03/20/2017   PLT 91 (L) 03/20/2017   GLUCOSE 141 (H) 03/20/2017   ALT 36 03/20/2017   AST 33 03/20/2017  NA 141 03/20/2017   K 3.1 (L) 03/20/2017   CL 105 03/20/2017   CREATININE 1.11 (H) 03/20/2017   BUN 20 03/20/2017   CO2 25 03/20/2017   TSH 3.26 03/02/2017   INR 1.8 (A) 10/28/2016   HGBA1C 6.2 03/02/2017   MICROALBUR 35.7 (H) 03/02/2017    Ct Abdomen Pelvis Wo Contrast  Result Date: 10/18/2016 CLINICAL DATA:  Worsening abdominal distention and pain for several days. Seen at another facility yesterday. EXAM: CT ABDOMEN AND PELVIS WITHOUT CONTRAST TECHNIQUE: Multidetector CT imaging of the abdomen and pelvis was performed following the standard protocol without IV contrast. COMPARISON:  10/17/2016 FINDINGS: Lower chest: Unchanged small right pleural effusion and minimal  atelectatic appearing linear lung base opacities. Hepatobiliary: No focal liver abnormality is seen. No gallstones, gallbladder wall thickening, or biliary dilatation. Pancreas: Unremarkable. No pancreatic ductal dilatation or surrounding inflammatory changes. Spleen: Normal in size without focal abnormality. Adrenals/Urinary Tract: Both adrenals are unremarkable. Stable scarring of the kidneys. No suspicious renal parenchymal lesions. No hydronephrosis. No urinary calculi. Unremarkable ureters. Urinary bladder is decompressed around a Foley catheter. Stomach/Bowel: New marked dilatation of proximal and mid small bowel. Decompressed distal small bowel. Enteric contrast persists in the small bowel from yesterday's CT, although some of the contrast has reached the transverse colon. There is mild mesenteric edema. Colon is unremarkable except for uncomplicated diverticulosis. Vascular/Lymphatic: Unchanged 6-7 cm abdominal aortic aneurysm with endovascular stent graft. No retroperitoneal hemorrhage. Reproductive: Unchanged 16 cm cystic mass in the pelvis, likely of right ovarian origin. Other: Small volume ascites, increased from yesterday. No free intraperitoneal air. Musculoskeletal: No significant skeletal lesion. IMPRESSION: 1. Small bowel obstruction, transition point probably in the low abdomen to the left of midline. 2. Small volume ascites, increased from yesterday. 3. Small right pleural effusion and minimal atelectatic lung base opacities, unchanged. 4. Unchanged abdominal aortic aneurysm with endovascular stent graft. Unchanged 16 cm cystic pelvic mass. Uncomplicated mild colonic diverticulosis. Electronically Signed   By: Andreas Newport M.D.   On: 10/18/2016 23:00   Dg Abdomen 1 View  Result Date: 10/19/2016 CLINICAL DATA:  Nasogastric tube placement EXAM: ABDOMEN - 1 VIEW COMPARISON:  CT 10/18/2016 FINDINGS: The nasogastric tube extends into the stomach, tip may be in the proximal duodenum. Persistent  dilated stacked small bowel loops consistent with obstruction. No extraluminal air is evident. IMPRESSION: 1. NG tube extends well into the stomach, possibly into the proximal duodenum. 2. Obstructive gas pattern with dilated stacked small bowel loops. Electronically Signed   By: Andreas Newport M.D.   On: 10/19/2016 00:15   Dg Chest Portable 1 View  Result Date: 10/18/2016 CLINICAL DATA:  Abdominal distension for 8 days EXAM: PORTABLE CHEST 1 VIEW COMPARISON:  CT 01/13/2010 FINDINGS: Cardiomegaly with generous appearance of mediastinum. Aortic atherosclerosis. Small pleural effusions. Streaky bibasilar atelectasis or infiltrates. Diffuse interstitial prominence. No pneumothorax. IMPRESSION: 1. Cardiomegaly with tiny pleural effusions and streaky bibasilar atelectasis or infiltrates 2. Prominent mediastinum, uncertain chronicity and likely augmented by portable technique and rotation. If mediastinal abnormality is suspected, CT chest follow-up could be obtained for further evaluation 3. Mild diffuse interstitial opacity, suspect that this is related to chronic changes. Electronically Signed   By: Donavan Foil M.D.   On: 10/18/2016 23:06    Assessment & Plan:   Lesleyanne was seen today for follow-up.  Diagnoses and all orders for this visit:  Abnormal urinalysis -     Cancel: Urinalysis, Routine w reflex microscopic -     Urinalysis, Routine  w reflex microscopic; Future -     nitrofurantoin, macrocrystal-monohydrate, (MACROBID) 100 MG capsule; Take 1 capsule (100 mg total) by mouth 2 (two) times daily for 7 days.  Thrombocytopenia ([place])  Type 2 diabetes mellitus without complication, without long-term current use of insulin ([place])   I am having Sharmaine Base start on nitrofurantoin (macrocrystal-monohydrate). I am also having her maintain her traMADol, diclofenac sodium, acetaminophen, polyethylene glycol, warfarin, amLODipine, glipiZIDE, losartan, and metoprolol tartrate.  Meds ordered this  encounter  Medications  . nitrofurantoin, macrocrystal-monohydrate, (MACROBID) 100 MG capsule    Sig: Take 1 capsule (100 mg total) by mouth 2 (two) times daily for 7 days.    Dispense:  14 capsule    Refill:  0   Encouraged her to maintain her hydration and be sure to eat after taking the glipizide which she is already doing.  She has a history of thrombocytopenia in the past we will recheck her platelets again in 3 months she will follow-up sooner with any abnormal bleeding or bruising.  Follow-up: Return in about 3 months (around 06/14/2017).  [name], MD

## 2017-03-18 ENCOUNTER — Other Ambulatory Visit (INDEPENDENT_AMBULATORY_CARE_PROVIDER_SITE_OTHER): Payer: Medicare Other

## 2017-03-18 DIAGNOSIS — R8290 Unspecified abnormal findings in urine: Secondary | ICD-10-CM | POA: Diagnosis not present

## 2017-03-18 LAB — URINALYSIS, ROUTINE W REFLEX MICROSCOPIC
BILIRUBIN URINE: NEGATIVE
Bacteria, UA: NONE SEEN /HPF
GLUCOSE, UA: NEGATIVE
HYALINE CAST: NONE SEEN /LPF
Ketones, ur: NEGATIVE
Nitrite: NEGATIVE
SPECIFIC GRAVITY, URINE: 1.024 (ref 1.001–1.03)
Squamous Epithelial / LPF: NONE SEEN /HPF (ref <=5)
WBC, UA: >=60 /HPF — AB (ref 0–5)
pH: <=5 (ref 5.0–8.0)

## 2017-03-20 ENCOUNTER — Emergency Department (HOSPITAL_BASED_OUTPATIENT_CLINIC_OR_DEPARTMENT_OTHER)
Admission: EM | Admit: 2017-03-20 | Discharge: 2017-03-20 | Disposition: A | Discharge status: Home / Self Care | Payer: Medicare Other | Attending: Emergency Medicine | Admitting: Emergency Medicine

## 2017-03-20 ENCOUNTER — Encounter (HOSPITAL_BASED_OUTPATIENT_CLINIC_OR_DEPARTMENT_OTHER): Payer: Self-pay | Admitting: *Deleted

## 2017-03-20 ENCOUNTER — Emergency Department (HOSPITAL_BASED_OUTPATIENT_CLINIC_OR_DEPARTMENT_OTHER): Payer: Medicare Other

## 2017-03-20 ENCOUNTER — Other Ambulatory Visit: Payer: Self-pay

## 2017-03-20 DIAGNOSIS — N200 Calculus of kidney: Secondary | ICD-10-CM | POA: Insufficient documentation

## 2017-03-20 DIAGNOSIS — Z7901 Long term (current) use of anticoagulants: Secondary | ICD-10-CM | POA: Diagnosis not present

## 2017-03-20 DIAGNOSIS — Z79899 Other long term (current) drug therapy: Secondary | ICD-10-CM | POA: Insufficient documentation

## 2017-03-20 DIAGNOSIS — E119 Type 2 diabetes mellitus without complications: Secondary | ICD-10-CM | POA: Diagnosis not present

## 2017-03-20 DIAGNOSIS — Z7984 Long term (current) use of oral hypoglycemic drugs: Secondary | ICD-10-CM | POA: Diagnosis not present

## 2017-03-20 DIAGNOSIS — I10 Essential (primary) hypertension: Secondary | ICD-10-CM | POA: Insufficient documentation

## 2017-03-20 DIAGNOSIS — F17228 Nicotine dependence, chewing tobacco, with other nicotine-induced disorders: Secondary | ICD-10-CM | POA: Insufficient documentation

## 2017-03-20 DIAGNOSIS — R1030 Lower abdominal pain, unspecified: Secondary | ICD-10-CM

## 2017-03-20 DIAGNOSIS — R109 Unspecified abdominal pain: Secondary | ICD-10-CM | POA: Diagnosis present

## 2017-03-20 LAB — COMPREHENSIVE METABOLIC PANEL
ALK PHOS: 69 U/L (ref 38–126)
ALT: 36 U/L (ref 14–54)
AST: 33 U/L (ref 15–41)
Albumin: 4.2 g/dL (ref 3.5–5.0)
Anion gap: 11 (ref 5–15)
BUN: 20 mg/dL (ref 6–20)
CALCIUM: 9.9 mg/dL (ref 8.9–10.3)
CO2: 25 mmol/L (ref 22–32)
Chloride: 105 mmol/L (ref 101–111)
Creatinine, Ser: 1.11 mg/dL — ABNORMAL HIGH (ref 0.44–1.00)
GFR, EST AFRICAN AMERICAN: 50 mL/min — AB (ref >60)
GFR, EST NON AFRICAN AMERICAN: 43 mL/min — AB (ref >60)
Glucose, Bld: 141 mg/dL — ABNORMAL HIGH (ref 65–99)
Potassium: 3.1 mmol/L — ABNORMAL LOW (ref 3.5–5.1)
Sodium: 141 mmol/L (ref 135–145)
Total Bilirubin: 0.5 mg/dL (ref 0.3–1.2)
Total Protein: 7.5 g/dL (ref 6.5–8.1)

## 2017-03-20 LAB — CBC WITH DIFFERENTIAL/PLATELET
Basophils Absolute: 0 10*3/uL (ref 0.0–0.1)
Basophils Relative: 0 %
Eosinophils Absolute: 0 10*3/uL (ref 0.0–0.7)
Eosinophils Relative: 0 %
HCT: 38.4 % (ref 36.0–46.0)
HEMOGLOBIN: 12 g/dL (ref 12.0–15.0)
LYMPHS PCT: 14 %
Lymphs Abs: 0.8 10*3/uL (ref 0.7–4.0)
MCH: 25.8 pg — ABNORMAL LOW (ref 26.0–34.0)
MCHC: 31.3 g/dL (ref 30.0–36.0)
MCV: 82.4 fL (ref 78.0–100.0)
Monocytes Absolute: 0.6 10*3/uL (ref 0.1–1.0)
Monocytes Relative: 9 %
NEUTROS PCT: 77 %
Neutro Abs: 4.5 10*3/uL (ref 1.7–7.7)
Platelets: 91 10*3/uL — ABNORMAL LOW (ref 150–400)
RBC: 4.66 MIL/uL (ref 3.87–5.11)
RDW: 14.8 % (ref 11.5–15.5)
WBC: 5.9 10*3/uL (ref 4.0–10.5)

## 2017-03-20 LAB — URINALYSIS, ROUTINE W REFLEX MICROSCOPIC
BILIRUBIN URINE: NEGATIVE
Glucose, UA: NEGATIVE mg/dL
Ketones, ur: NEGATIVE mg/dL
Leukocytes, UA: NEGATIVE
Nitrite: NEGATIVE
PROTEIN: 100 mg/dL — AB
SPECIFIC GRAVITY, URINE: 1.02 (ref 1.005–1.030)
pH: 6 (ref 5.0–8.0)

## 2017-03-20 LAB — URINALYSIS, MICROSCOPIC (REFLEX)

## 2017-03-20 LAB — LIPASE, BLOOD: LIPASE: 22 U/L (ref 11–51)

## 2017-03-20 MED ORDER — SODIUM CHLORIDE 0.9 % IV BOLUS (SEPSIS)
1000.0000 mL | Freq: Once | INTRAVENOUS | Status: AC
  Administered 2017-03-20: 1000 mL via INTRAVENOUS

## 2017-03-20 NOTE — ED Triage Notes (Signed)
Pt c/o nausea and generalized abd pain since 0100. Pt states pain is improved at this time and denies nausea

## 2017-03-20 NOTE — ED Provider Notes (Signed)
[facility] EMERGENCY DEPARTMENT Provider Note   CSN: [id number] Arrival date & time: 03/20/17  1124     History   Chief Complaint Chief Complaint  Patient presents with  . Abdominal Pain    HPI Chelsea Dean is a 84 y.o. female.  HPI   Presents with concern for abdominal pain and left flank pain. Started when woke up with burning pain in the stomach at 1AM.  Pain has improved now.  No dysuria, no frequency.  Nausea, no vomiting.  No diarrhea or constipation. Pain was severe, now pain is resolved.  No fever. Daughter reports her abdomen has looked more distended since yesterday.  She had a history of small bowel obstruction earlier this year, and they are concerned that this is causing her pain.  Patient reports last bowel movement was earlier today.  Reports she might of had a history of nephrolithiasis many many years ago, as well as a history of UTIs, but she does not remember if this felt similar.  Past Medical History:  Diagnosis Date  . A-fib ([place]) 10/19/2016  . Abdominal aortic aneurysm (AAA) without rupture ([place]) 09/15/2013  . Acute idiopathic gout of right foot   . AKI (acute kidney injury) ([place]) 10/18/2016  . Anticoagulated on Coumadin   . Arthritis   . Diabetes mellitus without complication ([place])   . Diverticular disease of large intestine 04/16/2013  . Diverticulosis   . DM2 (diabetes mellitus, type 2) ([place]) 10/19/2016  . Dysrhythmia    Atrial fib  . History of colon polyps 04/16/2013  . Hx of colonic polyps   . Hyperlipidemia   . Hypertension   . Long term current use of anticoagulant therapy 02/05/2015  . Lumbar radiculopathy 03/28/2014  . Microalbuminuria 04/16/2013  . Osteoarthritis 04/16/2013  . Osteoarthritis of cervical spine 03/28/2014  . Pelvic mass 10/19/2016  . Right renal mass   . SBO (small bowel obstruction) ([place]) 10/18/2016  . Thrombocytopenia ([place]) 04/16/2013   Overview:  referred to hematology 01/2104    Patient Active Problem List   Diagnosis Date  Noted  . Abnormal urinalysis 03/16/2017  . Hypokalemia 10/31/2016  . Lactic acidosis 10/31/2016  . Atrial fibrillation with RVR ([place]) 10/31/2016  . Elevated liver enzymes 10/31/2016  . Anticoagulated on Coumadin 10/31/2016  . Elevated INR 10/31/2016  . Hyperlipidemia 10/31/2016  . Hypertension 10/31/2016  . Acute idiopathic gout of right foot   . A-fib ([place]) 10/19/2016  . DM2 (diabetes mellitus, type 2) ([place]) 10/19/2016  . Pelvic mass 10/19/2016  . SBO (small bowel obstruction) ([place]) 10/18/2016  . AKI (acute kidney injury) ([place]) 10/18/2016  . Long term current use of anticoagulant therapy 02/05/2015  . Right renal mass   . Lumbar radiculopathy 03/28/2014  . Osteoarthritis of cervical spine 03/28/2014  . Abdominal aortic aneurysm (AAA) without rupture ([place]) 09/15/2013  . Diverticular disease of large intestine 04/16/2013  . History of colon polyps 04/16/2013  . Microalbuminuria 04/16/2013  . Osteoarthritis 04/16/2013  . Thrombocytopenia ([place]) 04/16/2013    Past Surgical History:  Procedure Laterality Date  . ABDOMINAL AORTIC ANEURYSM REPAIR    . ABDOMINAL HYSTERECTOMY    . IR GENERIC HISTORICAL  07/01/2015   IR RADIOLOGIST EVAL & MGMT 07/01/2015 [facility] INTERV RAD    OB History    No data available       Home Medications    Prior to Admission medications   Medication Sig Start Date End Date Taking? Authorizing Provider  acetaminophen (TYLENOL) 650 MG CR tablet  Take 650 mg by mouth daily as needed for pain.    [provider]  amLODipine (NORVASC) 10 MG tablet Take 1 tablet (10 mg total) by mouth daily. 03/02/17   [name], MD  diclofenac sodium (VOLTAREN) 1 % GEL Apply 2 g topically daily as needed (KNEE PAIN).    [provider]  glipiZIDE (GLUCOTROL XL) 10 MG 24 hr tablet Take 1 tablet (10 mg total) by mouth daily with breakfast. 03/02/17   [name], MD  losartan (COZAAR) 100 MG tablet Take 1 tablet (100 mg total) by mouth  daily. 03/02/17   [name], MD  metoprolol tartrate (LOPRESSOR) 100 MG tablet Take 1 tablet (100 mg total) by mouth 2 (two) times daily. 03/02/17   [name], MD  polyethylene glycol [facility] Floria Raveling) packet Take 17 g by mouth daily. 10/28/16   Dessa Phi, DO  traMADol (ULTRAM) 50 MG tablet Take 50 mg by mouth every 8 (eight) hours as needed for moderate pain.     [provider]  warfarin (COUMADIN) 2.5 MG tablet Take 2.5 mg by mouth daily. 2.5 mg daily except for Tuesday Thursday Saturday patient takes one half tablet (1.25 mg)    [provider]    Family History Family History  Problem Relation Age of Onset  . Cancer Sister        Rectal and Stomach    Social History Social History   Tobacco Use  . Smoking status: Never Smoker  . Smokeless tobacco: Current User    Types: Snuff  Substance Use Topics  . Alcohol use: No    Alcohol/week: 0.0 oz  . Drug use: No     Allergies   Ace inhibitors; Contrast media [iodinated diagnostic agents]; and Nsaids   Review of Systems Review of Systems  Constitutional: Negative for fever.  HENT: Negative for sore throat.   Eyes: Negative for visual disturbance.  Respiratory: Negative for cough and shortness of breath.   Cardiovascular: Negative for chest pain.  Gastrointestinal: Positive for abdominal pain, constipation, nausea and vomiting. Negative for diarrhea.  Genitourinary: Positive for flank pain. Negative for difficulty urinating and dysuria.  Musculoskeletal: Negative for back pain and neck pain.  Skin: Negative for rash.  Neurological: Negative for syncope and headaches.     Physical Exam Updated Vital Signs BP 136/65 (BP Location: Left Arm)   Pulse 68   Temp 98.3 F (36.8 C) (Oral)   Resp 18   SpO2 97%   Physical Exam  Constitutional: She is oriented to person, place, and time. She appears well-developed and well-nourished. No distress.  HENT:  Head: Normocephalic  and atraumatic.  Eyes: Conjunctivae and EOM are normal.  Neck: Normal range of motion.  Cardiovascular: Normal rate, regular rhythm, normal heart sounds and intact distal pulses. Exam reveals no gallop and no friction rub.  No murmur heard. Pulmonary/Chest: Effort normal and breath sounds normal. No respiratory distress. She has no wheezes. She has no rales.  Abdominal: Soft. She exhibits distension. There is no tenderness. There is no guarding, no CVA tenderness, no tenderness at McBurney's point and negative Murphy's sign.  Musculoskeletal: She exhibits no edema or tenderness.  Neurological: She is alert and oriented to person, place, and time.  Skin: Skin is warm and dry. No rash noted. She is not diaphoretic. No erythema.  Nursing note and vitals reviewed.    ED Treatments / Results  Labs (all labs ordered are listed, but only abnormal results  are displayed) Labs Reviewed  CBC WITH DIFFERENTIAL/PLATELET - Abnormal; Notable for the following components:      Result Value   MCH 25.8 (*)    Platelets 91 (*)    All other components within normal limits  COMPREHENSIVE METABOLIC PANEL - Abnormal; Notable for the following components:   Potassium 3.1 (*)    Glucose, Bld 141 (*)    Creatinine, Ser 1.11 (*)    GFR calc non Af Amer 43 (*)    GFR calc Af Amer 50 (*)    All other components within normal limits  URINALYSIS, ROUTINE W REFLEX MICROSCOPIC - Abnormal; Notable for the following components:   APPearance CLOUDY (*)    Hgb urine dipstick LARGE (*)    Protein, ur 100 (*)    All other components within normal limits  URINALYSIS, MICROSCOPIC (REFLEX) - Abnormal; Notable for the following components:   Bacteria, UA FEW (*)    Squamous Epithelial / LPF 0-5 (*)    All other components within normal limits  URINE CULTURE  LIPASE, BLOOD    EKG  EKG Interpretation None       Radiology Ct Abdomen Pelvis Wo Contrast  Result Date: 03/20/2017 CLINICAL DATA:  Left abdominal  pain. EXAM: CT ABDOMEN AND PELVIS WITHOUT CONTRAST TECHNIQUE: Multidetector CT imaging of the abdomen and pelvis was performed following the standard protocol without IV contrast. COMPARISON:  10/18/2016. FINDINGS: Lower chest: The heart remains enlarged. Minimal bibasilar linear atelectasis or scarring with improvement. Hepatobiliary: No focal liver abnormality is seen. No gallstones, gallbladder wall thickening, or biliary dilatation. Pancreas: Unremarkable. No pancreatic ductal dilatation or surrounding inflammatory changes. Spleen: Normal in size without focal abnormality. Adrenals/Urinary Tract: Normal appearing adrenal glands. Interval mild dilatation of the left renal collecting system and ureter to the level of the urinary bladder. There are multiple phleboliths adjacent to the distal left ureter in the pelvis with no definite ureteral calculi visualized. There is interval left perinephric soft tissue stranding. And upper pole left renal cyst is unchanged. There has also been no significant change in a tiny calculus in the upper pole of the left kidney as well as several small right renal calculi. The largest calculus on the right is in the lower pole, measuring 6 mm in maximum diameter. No bladder or right ureteral calculi are visualized. Stomach/Bowel: Multiple colonic diverticula without evidence of diverticulitis. Normal appearing appendix, stomach and small bowel. Vascular/Lymphatic: An aorto bi-iliac stent is again demonstrated with a large surrounding distal abdominal aortic aneurysm measuring 7.5 cm in maximum diameter, previously 7.4 cm in corresponding diameter. No enlarged lymph nodes. Reproductive: Status post hysterectomy. No adnexal masses. Other: Right pelvic fluid collection measuring 10.5 x 6.1 cm on axial image number 58 of series 2 and 9.5 cm in length on coronal image number 47. This was previously in the midline with a maximum diameter of 16.1 cm. Musculoskeletal: Lumbar and lower  thoracic spine degenerative changes and mild scoliosis. Mild bilateral hip degenerative changes. IMPRESSION: 1. Interval mild left hydronephrosis and hydroureter to the level of the urinary bladder. This may be due to passage of a recent ureteral calculus. There are multiple phleboliths adjacent to the distal left ureter with no definite calculus visualized in the ureter today. 2. Small right renal calculi and tiny left renal calculus. 3. Colonic diverticulosis. 4. Stable aorto bi-iliac stent with a large surrounding abdominal aortic aneurysm. 5. Interval decrease in size of a loculated fluid collection in the pelvis, currently measuring 10.5 x 9.5  x 6.1 cm. This may represent a large right ovarian cyst or ovarian remnant cyst, loculated ascites, seroma or lymphocele. 6. Stable cardiomegaly. Electronically Signed   By: Claudie Revering M.D.   On: 03/20/2017 14:04    Procedures Procedures (including critical care time)  Medications Ordered in ED Medications  sodium chloride 0.9 % bolus 1,000 mL (0 mLs Intravenous Stopped 03/20/17 1456)     Initial Impression / Assessment and Plan / ED Course  I have reviewed the triage vital signs and the nursing notes.  Pertinent labs & imaging results that were available during my care of the patient were reviewed by me and considered in my medical decision making (see chart for details).    84 year old female with a history above, including history of prior small bowel obstruction, presents with concern for abdominal pain, nausea and vomiting.  Patient reports her symptoms are improved in the emergency department, however does continue distention on exam and history concerning for possible SBO.  CT abdomen pelvis was ordered which did not show any evidence of obstruction, however does show interval mild left hydronephrosis and hydroureter to the level of the bladder, which may be due to passage of recent ureteral calculus.  There are multiple phleboliths adjacent to  the left ureter with no definite calculi is visualized today.  Given patient did have left flank pain, has hematuria on urinalysis, and reports symptoms have improved now, suspect patient did likely have nephrolithiasis with passage of the stone.  No other acute findings seen on CT abdomen pelvis.  Recommend follow-up with her primary care physician.  Final Clinical Impressions(s) / ED Diagnoses   Final diagnoses:  Lower abdominal pain  Nephrolithiasis    ED Discharge Orders    None       Gareth Morgan, MD 03/20/17 1744

## 2017-03-20 NOTE — ED Notes (Signed)
Patient transported to CT 

## 2017-03-21 MED ORDER — NITROFURANTOIN MONOHYD MACRO 100 MG PO CAPS: 100.0000 mg | ORAL_CAPSULE | Freq: Two times a day (BID) | ORAL | 0 refills | Status: AC

## 2017-03-21 NOTE — Addendum Note (Signed)
Addended by: Jon Billings on: 03/21/2017 08:24 AM   Modules accepted: Orders

## 2017-03-22 LAB — URINE CULTURE

## 2017-03-29 ENCOUNTER — Ambulatory Visit: Payer: Self-pay | Admitting: *Deleted

## 2017-03-29 ENCOUNTER — Emergency Department (HOSPITAL_BASED_OUTPATIENT_CLINIC_OR_DEPARTMENT_OTHER)
Admission: EM | Admit: 2017-03-29 | Discharge: 2017-03-29 | Disposition: A | Discharge status: Home / Self Care | Payer: Medicare Other | Attending: Emergency Medicine | Admitting: Emergency Medicine

## 2017-03-29 ENCOUNTER — Encounter (HOSPITAL_BASED_OUTPATIENT_CLINIC_OR_DEPARTMENT_OTHER): Payer: Self-pay | Admitting: Emergency Medicine

## 2017-03-29 ENCOUNTER — Other Ambulatory Visit: Payer: Self-pay

## 2017-03-29 ENCOUNTER — Emergency Department (HOSPITAL_BASED_OUTPATIENT_CLINIC_OR_DEPARTMENT_OTHER): Payer: Medicare Other

## 2017-03-29 DIAGNOSIS — Z7901 Long term (current) use of anticoagulants: Secondary | ICD-10-CM | POA: Insufficient documentation

## 2017-03-29 DIAGNOSIS — Z79899 Other long term (current) drug therapy: Secondary | ICD-10-CM | POA: Insufficient documentation

## 2017-03-29 DIAGNOSIS — I714 Abdominal aortic aneurysm, without rupture: Secondary | ICD-10-CM | POA: Insufficient documentation

## 2017-03-29 DIAGNOSIS — M545 Low back pain, unspecified: Secondary | ICD-10-CM

## 2017-03-29 DIAGNOSIS — E119 Type 2 diabetes mellitus without complications: Secondary | ICD-10-CM | POA: Diagnosis not present

## 2017-03-29 LAB — COMPREHENSIVE METABOLIC PANEL
ALBUMIN: 3.3 g/dL — AB (ref 3.5–5.0)
ALT: 16 U/L (ref 14–54)
ANION GAP: 6 (ref 5–15)
AST: 17 U/L (ref 15–41)
Alkaline Phosphatase: 53 U/L (ref 38–126)
BUN: 18 mg/dL (ref 6–20)
CHLORIDE: 102 mmol/L (ref 101–111)
CO2: 26 mmol/L (ref 22–32)
Calcium: 8.6 mg/dL — ABNORMAL LOW (ref 8.9–10.3)
Creatinine, Ser: 0.99 mg/dL (ref 0.44–1.00)
GFR calc Af Amer: 58 mL/min — ABNORMAL LOW (ref >60)
GFR calc non Af Amer: 50 mL/min — ABNORMAL LOW (ref >60)
GLUCOSE: 111 mg/dL — AB (ref 65–99)
POTASSIUM: 2.6 mmol/L — AB (ref 3.5–5.1)
SODIUM: 134 mmol/L — AB (ref 135–145)
Total Bilirubin: 1 mg/dL (ref 0.3–1.2)
Total Protein: 6.5 g/dL (ref 6.5–8.1)

## 2017-03-29 LAB — CBC WITH DIFFERENTIAL/PLATELET
BASOS PCT: 0 %
Basophils Absolute: 0 10*3/uL (ref 0.0–0.1)
EOS PCT: 3 %
Eosinophils Absolute: 0.3 10*3/uL (ref 0.0–0.7)
HEMATOCRIT: 37.2 % (ref 36.0–46.0)
HEMOGLOBIN: 11.6 g/dL — AB (ref 12.0–15.0)
Lymphocytes Relative: 7 %
Lymphs Abs: 0.7 10*3/uL (ref 0.7–4.0)
MCH: 25.8 pg — ABNORMAL LOW (ref 26.0–34.0)
MCHC: 31.2 g/dL (ref 30.0–36.0)
MCV: 82.7 fL (ref 78.0–100.0)
MONOS PCT: 15 %
Monocytes Absolute: 1.4 10*3/uL — ABNORMAL HIGH (ref 0.1–1.0)
NEUTROS ABS: 6.9 10*3/uL (ref 1.7–7.7)
Neutrophils Relative %: 75 %
Platelets: 76 10*3/uL — ABNORMAL LOW (ref 150–400)
RBC: 4.5 MIL/uL (ref 3.87–5.11)
RDW: 14.8 % (ref 11.5–15.5)
Smear Review: DECREASED
WBC: 9.3 10*3/uL (ref 4.0–10.5)

## 2017-03-29 LAB — URINALYSIS, ROUTINE W REFLEX MICROSCOPIC
Glucose, UA: NEGATIVE mg/dL
KETONES UR: NEGATIVE mg/dL
NITRITE: NEGATIVE
Protein, ur: 100 mg/dL — AB
Specific Gravity, Urine: 1.025 (ref 1.005–1.030)
pH: 6 (ref 5.0–8.0)

## 2017-03-29 LAB — URINALYSIS, MICROSCOPIC (REFLEX)

## 2017-03-29 LAB — MAGNESIUM: Magnesium: 1.5 mg/dL — ABNORMAL LOW (ref 1.7–2.4)

## 2017-03-29 MED ORDER — ACETAMINOPHEN 325 MG PO TABS
650.0000 mg | ORAL_TABLET | Freq: Once | ORAL | Status: AC
  Administered 2017-03-29: 650 mg via ORAL
  Filled 2017-03-29: qty 2

## 2017-03-29 MED ORDER — POTASSIUM CHLORIDE CRYS ER 20 MEQ PO TBCR
60.0000 meq | EXTENDED_RELEASE_TABLET | Freq: Once | ORAL | Status: AC
  Administered 2017-03-29: 60 meq via ORAL
  Filled 2017-03-29: qty 3

## 2017-03-29 MED ORDER — MAGNESIUM SULFATE IN D5W 1-5 GM/100ML-% IV SOLN
INTRAVENOUS | Status: AC
  Administered 2017-03-29: 1 g via INTRAVENOUS
  Filled 2017-03-29: qty 100

## 2017-03-29 MED ORDER — MAGNESIUM SULFATE 50 % IJ SOLN: 1.0000 g | Freq: Once | INTRAMUSCULAR | Status: DC

## 2017-03-29 MED ORDER — MAGNESIUM SULFATE IN D5W 1-5 GM/100ML-% IV SOLN
1.0000 g | Freq: Once | INTRAVENOUS | Status: AC
  Administered 2017-03-29: 1 g via INTRAVENOUS

## 2017-03-29 MED ORDER — MAGNESIUM OXIDE 400 (241.3 MG) MG PO TABS
800.0000 mg | ORAL_TABLET | Freq: Once | ORAL | Status: DC
  Filled 2017-03-29: qty 2

## 2017-03-29 NOTE — Discharge Instructions (Signed)
Take Tylenol every 4 hours as needed for pain.  Call your primary care physician tomorrow to arrange to get your blood potassium and blood magnesium rechecked within a week.  Today's ultrasound showed a slight amount of sludge in your gallbladder, but not likely the cause of your pain your urine was sent for culture.  We will call you if an infection shows that was not seen today

## 2017-03-29 NOTE — ED Notes (Signed)
Family at bedside. 

## 2017-03-29 NOTE — Telephone Encounter (Signed)
I just checked in her chart, they have taken her to the ED.

## 2017-03-29 NOTE — ED Provider Notes (Signed)
[facility] EMERGENCY DEPARTMENT Provider Note   CSN: [id number] Arrival date & time: 03/29/17  1220     History   Chief Complaint Chief Complaint  Patient presents with  . Back Pain    HPI Chelsea Dean is a 84 y.o. female.  HPI Planes of right-sided low back pain onset greater than a week ago.  She was seen here for same complaint March 20, 2017  had CT scan of abdomen pelvis consistent with mild left hydronephrosis and hydroureter consistent with possible passage of recent ureteral calculus.  Multiple phleboliths and stable aortic aneurysm, stented she also had a large loculated fluid collection in the pelvis representing ovarian cyst, loculated ascites, seromas or lymphocele.  Pain is not made better or worse by anything.  No fever.  She was treated with antibiotics.  Her family reports she has not eaten in 2 days.  She denies feeling hungry presently.  Denies lightheadedness denies abdominal pain denies fever.  No treatment prior to coming here Past Medical History:  Diagnosis Date  . A-fib ([place]) 10/19/2016  . Abdominal aortic aneurysm (AAA) without rupture ([place]) 09/15/2013  . Acute idiopathic gout of right foot   . AKI (acute kidney injury) ([place]) 10/18/2016  . Anticoagulated on Coumadin   . Arthritis   . Diabetes mellitus without complication ([place])   . Diverticular disease of large intestine 04/16/2013  . Diverticulosis   . DM2 (diabetes mellitus, type 2) ([place]) 10/19/2016  . Dysrhythmia    Atrial fib  . History of colon polyps 04/16/2013  . Hx of colonic polyps   . Hyperlipidemia   . Hypertension   . Long term current use of anticoagulant therapy 02/05/2015  . Lumbar radiculopathy 03/28/2014  . Microalbuminuria 04/16/2013  . Osteoarthritis 04/16/2013  . Osteoarthritis of cervical spine 03/28/2014  . Pelvic mass 10/19/2016  . Right renal mass   . SBO (small bowel obstruction) ([place]) 10/18/2016  . Thrombocytopenia ([place]) 04/16/2013   Overview:  referred to hematology 01/2104     Patient Active Problem List   Diagnosis Date Noted  . Abnormal urinalysis 03/16/2017  . Hypokalemia 10/31/2016  . Lactic acidosis 10/31/2016  . Atrial fibrillation with RVR ([place]) 10/31/2016  . Elevated liver enzymes 10/31/2016  . Anticoagulated on Coumadin 10/31/2016  . Elevated INR 10/31/2016  . Hyperlipidemia 10/31/2016  . Hypertension 10/31/2016  . Acute idiopathic gout of right foot   . A-fib ([place]) 10/19/2016  . DM2 (diabetes mellitus, type 2) ([place]) 10/19/2016  . Pelvic mass 10/19/2016  . SBO (small bowel obstruction) ([place]) 10/18/2016  . AKI (acute kidney injury) ([place]) 10/18/2016  . Long term current use of anticoagulant therapy 02/05/2015  . Right renal mass   . Lumbar radiculopathy 03/28/2014  . Osteoarthritis of cervical spine 03/28/2014  . Abdominal aortic aneurysm (AAA) without rupture ([place]) 09/15/2013  . Diverticular disease of large intestine 04/16/2013  . History of colon polyps 04/16/2013  . Microalbuminuria 04/16/2013  . Osteoarthritis 04/16/2013  . Thrombocytopenia ([place]) 04/16/2013    Past Surgical History:  Procedure Laterality Date  . ABDOMINAL AORTIC ANEURYSM REPAIR    . ABDOMINAL HYSTERECTOMY    . IR GENERIC HISTORICAL  07/01/2015   IR RADIOLOGIST EVAL & MGMT 07/01/2015 [facility] INTERV RAD    OB History    No data available       Home Medications    Prior to Admission medications   Medication Sig Start Date End Date Taking? Authorizing Provider  acetaminophen (TYLENOL) 650 MG CR tablet Take 650  mg by mouth daily as needed for pain.    [provider]  amLODipine (NORVASC) 10 MG tablet Take 1 tablet (10 mg total) by mouth daily. 03/02/17   [name], MD  diclofenac sodium (VOLTAREN) 1 % GEL Apply 2 g topically daily as needed (KNEE PAIN).    [provider]  glipiZIDE (GLUCOTROL XL) 10 MG 24 hr tablet Take 1 tablet (10 mg total) by mouth daily with breakfast. 03/02/17   [name], MD  losartan (COZAAR)  100 MG tablet Take 1 tablet (100 mg total) by mouth daily. 03/02/17   [name], MD  metoprolol tartrate (LOPRESSOR) 100 MG tablet Take 1 tablet (100 mg total) by mouth 2 (two) times daily. 03/02/17   [name], MD  polyethylene glycol [facility] / Floria Raveling) packet Take 17 g by mouth daily. 10/28/16   Dessa Phi, DO  traMADol (ULTRAM) 50 MG tablet Take 50 mg by mouth every 8 (eight) hours as needed for moderate pain.     [provider]  warfarin (COUMADIN) 2.5 MG tablet Take 2.5 mg by mouth daily. 2.5 mg daily except for Tuesday Thursday Saturday patient takes one half tablet (1.25 mg)    [provider]    Family History Family History  Problem Relation Age of Onset  . Cancer Sister        Rectal and Stomach    Social History Social History   Tobacco Use  . Smoking status: Never Smoker  . Smokeless tobacco: Current User    Types: Snuff  Substance Use Topics  . Alcohol use: No    Alcohol/week: 0.0 oz  . Drug use: No     Allergies   Ace inhibitors; Contrast media [iodinated diagnostic agents]; and Nsaids   Review of Systems Review of Systems  Constitutional: Positive for appetite change.  HENT: Negative.   Respiratory: Negative.   Cardiovascular: Negative.   Gastrointestinal: Negative.   Genitourinary: Positive for flank pain.  Musculoskeletal: Positive for back pain.  Skin: Negative.   Neurological: Negative.   Psychiatric/Behavioral: Negative.   All other systems reviewed and are negative.    Physical Exam Updated Vital Signs BP (!) 131/94 (BP Location: Right Arm)   Pulse 68   Temp 99.8 F (37.7 C) (Oral)   Resp 18   Ht 5\' 2"  (1.575 m)   Wt 74.8 kg (165 lb)   SpO2 94%   BMI 30.18 kg/m   Physical Exam  Constitutional: She appears well-developed and well-nourished. No distress.  HENT:  Head: Normocephalic and atraumatic.  Eyes: Conjunctivae are normal. Pupils are equal, round, and reactive to light.  Neck:  Neck supple. No tracheal deviation present. No thyromegaly present.  Cardiovascular: Normal rate and regular rhythm.  No murmur heard. Pulmonary/Chest: Effort normal and breath sounds normal.  Abdominal: Soft. Bowel sounds are normal. She exhibits no distension. There is no tenderness.  Genitourinary:  Genitourinary Comments: No flank tenderness  Musculoskeletal: Normal range of motion. She exhibits no edema or tenderness.  Neurological: She is alert. Coordination normal.  Skin: Skin is warm and dry. No rash noted.  Psychiatric: She has a normal mood and affect.  Nursing note and vitals reviewed.    ED Treatments / Results  Labs (all labs ordered are listed, but only abnormal results are displayed) Labs Reviewed  URINALYSIS, ROUTINE W REFLEX MICROSCOPIC  COMPREHENSIVE METABOLIC PANEL  CBC WITH DIFFERENTIAL/PLATELET    EKG  EKG Interpretation None  Radiology No results found.  Procedures Procedures (including critical care time)  Medications Ordered in ED Medications  acetaminophen (TYLENOL) tablet 650 mg (not administered)    Results for orders placed or performed during the hospital encounter of 03/29/17  Urinalysis, Routine w reflex microscopic  Result Value Ref Range   Color, Urine AMBER (A) YELLOW   APPearance CLEAR CLEAR   Specific Gravity, Urine 1.025 1.005 - 1.030   pH 6.0 5.0 - 8.0   Glucose, UA NEGATIVE NEGATIVE mg/dL   Hgb urine dipstick TRACE (A) NEGATIVE   Bilirubin Urine SMALL (A) NEGATIVE   Ketones, ur NEGATIVE NEGATIVE mg/dL   Protein, ur 100 (A) NEGATIVE mg/dL   Nitrite NEGATIVE NEGATIVE   Leukocytes, UA TRACE (A) NEGATIVE  Comprehensive metabolic panel  Result Value Ref Range   Sodium 134 (L) 135 - 145 mmol/L   Potassium 2.6 (LL) 3.5 - 5.1 mmol/L   Chloride 102 101 - 111 mmol/L   CO2 26 22 - 32 mmol/L   Glucose, Bld 111 (H) 65 - 99 mg/dL   BUN 18 6 - 20 mg/dL   Creatinine, Ser 0.99 0.44 - 1.00 mg/dL   Calcium 8.6 (L) 8.9 - 10.3  mg/dL   Total Protein 6.5 6.5 - 8.1 g/dL   Albumin 3.3 (L) 3.5 - 5.0 g/dL   AST 17 15 - 41 U/L   ALT 16 14 - 54 U/L   Alkaline Phosphatase 53 38 - 126 U/L   Total Bilirubin 1.0 0.3 - 1.2 mg/dL   GFR calc non Af Amer 50 (L) >60 mL/min   GFR calc Af Amer 58 (L) >60 mL/min   Anion gap 6 5 - 15  CBC with Differential/Platelet  Result Value Ref Range   WBC 9.3 4.0 - 10.5 K/uL   RBC 4.50 3.87 - 5.11 MIL/uL   Hemoglobin 11.6 (L) 12.0 - 15.0 g/dL   HCT 37.2 36.0 - 46.0 %   MCV 82.7 78.0 - 100.0 fL   MCH 25.8 (L) 26.0 - 34.0 pg   MCHC 31.2 30.0 - 36.0 g/dL   RDW 14.8 11.5 - 15.5 %   Platelets 76 (L) 150 - 400 K/uL   Neutrophils Relative % 75 %   Lymphocytes Relative 7 %   Monocytes Relative 15 %   Eosinophils Relative 3 %   Basophils Relative 0 %   Neutro Abs 6.9 1.7 - 7.7 K/uL   Lymphs Abs 0.7 0.7 - 4.0 K/uL   Monocytes Absolute 1.4 (H) 0.1 - 1.0 K/uL   Eosinophils Absolute 0.3 0.0 - 0.7 K/uL   Basophils Absolute 0.0 0.0 - 0.1 K/uL   Smear Review PLATELETS APPEAR DECREASED   Urinalysis, Microscopic (reflex)  Result Value Ref Range   RBC / HPF 0-5 0 - 5 RBC/hpf   WBC, UA 6-30 0 - 5 WBC/hpf   Bacteria, UA FEW (A) NONE SEEN   Squamous Epithelial / LPF 0-5 (A) NONE SEEN  Magnesium  Result Value Ref Range   Magnesium 1.5 (L) 1.7 - 2.4 mg/dL   Ct Abdomen Pelvis Wo Contrast  Result Date: 03/20/2017 CLINICAL DATA:  Left abdominal pain. EXAM: CT ABDOMEN AND PELVIS WITHOUT CONTRAST TECHNIQUE: Multidetector CT imaging of the abdomen and pelvis was performed following the standard protocol without IV contrast. COMPARISON:  10/18/2016. FINDINGS: Lower chest: The heart remains enlarged. Minimal bibasilar linear atelectasis or scarring with improvement. Hepatobiliary: No focal liver abnormality is seen. No gallstones, gallbladder wall thickening, or biliary dilatation. Pancreas: Unremarkable. No pancreatic ductal  dilatation or surrounding inflammatory changes. Spleen: Normal in size without focal  abnormality. Adrenals/Urinary Tract: Normal appearing adrenal glands. Interval mild dilatation of the left renal collecting system and ureter to the level of the urinary bladder. There are multiple phleboliths adjacent to the distal left ureter in the pelvis with no definite ureteral calculi visualized. There is interval left perinephric soft tissue stranding. And upper pole left renal cyst is unchanged. There has also been no significant change in a tiny calculus in the upper pole of the left kidney as well as several small right renal calculi. The largest calculus on the right is in the lower pole, measuring 6 mm in maximum diameter. No bladder or right ureteral calculi are visualized. Stomach/Bowel: Multiple colonic diverticula without evidence of diverticulitis. Normal appearing appendix, stomach and small bowel. Vascular/Lymphatic: An aorto bi-iliac stent is again demonstrated with a large surrounding distal abdominal aortic aneurysm measuring 7.5 cm in maximum diameter, previously 7.4 cm in corresponding diameter. No enlarged lymph nodes. Reproductive: Status post hysterectomy. No adnexal masses. Other: Right pelvic fluid collection measuring 10.5 x 6.1 cm on axial image number 58 of series 2 and 9.5 cm in length on coronal image number 47. This was previously in the midline with a maximum diameter of 16.1 cm. Musculoskeletal: Lumbar and lower thoracic spine degenerative changes and mild scoliosis. Mild bilateral hip degenerative changes. IMPRESSION: 1. Interval mild left hydronephrosis and hydroureter to the level of the urinary bladder. This may be due to passage of a recent ureteral calculus. There are multiple phleboliths adjacent to the distal left ureter with no definite calculus visualized in the ureter today. 2. Small right renal calculi and tiny left renal calculus. 3. Colonic diverticulosis. 4. Stable aorto bi-iliac stent with a large surrounding abdominal aortic aneurysm. 5. Interval decrease in  size of a loculated fluid collection in the pelvis, currently measuring 10.5 x 9.5 x 6.1 cm. This may represent a large right ovarian cyst or ovarian remnant cyst, loculated ascites, seroma or lymphocele. 6. Stable cardiomegaly. Electronically Signed   By: Claudie Revering M.D.   On: 03/20/2017 14:04   US Abdomen Complete  Result Date: 03/29/2017 CLINICAL DATA:  Abdominal pain, RIGHT back pain, known abdominal aortic aneurysm with stent graft. EXAM: ABDOMEN ULTRASOUND COMPLETE COMPARISON:  None. FINDINGS: Gallbladder: Mild sludge within gallbladder. No gallbladder distension. Negative sonographic Murphy's sign Common bile duct: Diameter: Normal at 4 mm Liver: No focal lesion identified. Within normal limits in parenchymal echogenicity. Portal vein is patent on color Doppler imaging with normal direction of blood flow towards the liver. IVC: No abnormality visualized. Pancreas: Visualized portion unremarkable. Spleen: Size and appearance within normal limits. Right Kidney: Length: 11.3 cm. The small anechoic cysts. No hydronephrosis Left Kidney: Length: 12.3 cm. Small anechoic cyst. No hydronephrosis Abdominal aorta: Stent in distal aorta.  Proximal aorta Other findings: Small RIGHT effusion. IMPRESSION: No acute abdominal findings by ultrasound.  Small RIGHT effusion. Minimal sludge within the gallbladder. No hydronephrosis. Electronically Signed   By: Suzy Bouchard M.D.   On: 03/29/2017 17:35   Initial Impression / Assessment and Plan / ED Course  I have reviewed the triage vital signs and the nursing notes.  Pertinent labs & imaging results that were available during my care of the patient were reviewed by me and considered in my medical decision making (see chart for details).     7:35 PM patient has no pain after treatment with Tylenol.  She is able to walk without difficulty.  She  ate while here.  She was treated with oral potassium supplementation and IV magnesium supplementation .  Urine sent for  culture thrombocytopenia is chronic Final Clinical Impressions(s) / ED Diagnoses  Diagnosis #1 low back pain Final diagnoses:  None  #2 hypokalemia #3 hypomagnesemia  ED Discharge Orders    None       Orlie Dakin, MD 03/29/17 [phone]

## 2017-03-29 NOTE — Telephone Encounter (Signed)
Okay. Should go to er with acute abdominal pain.

## 2017-03-29 NOTE — Telephone Encounter (Signed)
FYI  Daughter or brother will go to check on patient & call back once they are with her.

## 2017-03-29 NOTE — ED Triage Notes (Signed)
Patient sates that she woke up this am with lower back pain and pain to her right side. Patients family with the patient and states that she is not eating. She was her eon the 3rd and they thought she had passed a kidney stone but the patient still not felling well

## 2017-03-29 NOTE — ED Notes (Signed)
Ambulated in hall without any difficulty.  Assist x 1.

## 2017-03-29 NOTE — Telephone Encounter (Signed)
  Answer Assessment - Initial Assessment Questions . REASON FOR CALL: "What is your main concern right now?"     Daughter is calling to report her mother is not "acting right" Patient is not eating and has been in the bed all day. Daughter reports patient has been recently treated for bowel blockage and kidney infection. She states she talked to her on the phone and she didn't sound right today.  She is not with the patient and triage is not possible. Advised daughter to go to home or have her brother got to home so that someone is able to evaluate the severity of the problem and decide if she needs to be seen at the office or hospital. She states she will have someone check on the patient and call back.  Protocols used: NO GUIDELINE AVAILABLE-A-AH

## 2017-03-31 ENCOUNTER — Encounter: Payer: Self-pay | Admitting: Family Medicine

## 2017-03-31 ENCOUNTER — Ambulatory Visit (INDEPENDENT_AMBULATORY_CARE_PROVIDER_SITE_OTHER): Payer: Medicare Other | Admitting: Family Medicine

## 2017-03-31 VITALS — BP 130/78 | HR 86 | Ht 62.0 in

## 2017-03-31 DIAGNOSIS — L817 Pigmented purpuric dermatosis: Secondary | ICD-10-CM | POA: Diagnosis not present

## 2017-03-31 DIAGNOSIS — E876 Hypokalemia: Secondary | ICD-10-CM | POA: Diagnosis not present

## 2017-03-31 DIAGNOSIS — N069 Isolated proteinuria with unspecified morphologic lesion: Secondary | ICD-10-CM

## 2017-03-31 LAB — URINE CULTURE: Special Requests: NORMAL

## 2017-03-31 NOTE — Progress Notes (Signed)
Subjective:  Patient ID: Chelsea Dean, female    DOB: 05-Jul-1927  Age: 84 y.o. MRN: [id number]  CC: Follow-up   HPI Carlo Lorson presents for hospital follow-up for your evaluation of right abdominal pain.  At that time both of her magnesium and potassium levels were found to be depressed.  She was treated with IV magnesium and potassium.  She is here to follow-up those levels.  At the time of her evaluation the pain was in the right lower back.  Is now since resolved.  I reviewed the ultrasound report from her ER visit 2 days ago.  There was gallbladder sludge noted and a small right pelvic effusion.  CT scan performed on February 3 was negative for acute processes.  BUN and creatinine have shown interval improvement from lab work drawn 11 days ago.  There is been interval improvement in her LFTs as well.  Outpatient Medications Prior to Visit  Medication Sig Dispense Refill  . acetaminophen (TYLENOL) 650 MG CR tablet Take 650 mg by mouth daily as needed for pain.    Marland Kitchen amLODipine (NORVASC) 10 MG tablet Take 1 tablet (10 mg total) by mouth daily. 90 tablet 1  . diclofenac sodium (VOLTAREN) 1 % GEL Apply 2 g topically daily as needed (KNEE PAIN).    Marland Kitchen glipiZIDE (GLUCOTROL XL) 10 MG 24 hr tablet Take 1 tablet (10 mg total) by mouth daily with breakfast. 90 tablet 1  . losartan (COZAAR) 100 MG tablet Take 1 tablet (100 mg total) by mouth daily. 90 tablet 1  . metoprolol tartrate (LOPRESSOR) 100 MG tablet Take 1 tablet (100 mg total) by mouth 2 (two) times daily. 180 tablet 1  . polyethylene glycol (MIRALAX / GLYCOLAX) packet Take 17 g by mouth daily. 14 each 0  . warfarin (COUMADIN) 2.5 MG tablet Take 2.5 mg by mouth daily. 2.5 mg daily except for Tuesday Thursday Saturday patient takes one half tablet (1.25 mg)    . traMADol (ULTRAM) 50 MG tablet Take 50 mg by mouth every 8 (eight) hours as needed for moderate pain.      No facility-administered medications prior to visit.     ROS Review  of Systems  Constitutional: Positive for fatigue. Negative for chills and fever.  Respiratory: Negative.   Cardiovascular: Negative.   Gastrointestinal: Negative for abdominal pain, anal bleeding, blood in stool, nausea and vomiting.  Genitourinary: Negative.  Negative for hematuria.  Skin: Positive for color change and rash.  Allergic/Immunologic: Negative for immunocompromised state.  Neurological: Negative.   Hematological: Does not bruise/bleed easily.  Psychiatric/Behavioral: Negative.     Objective:  BP 130/78 (BP Location: Left Arm, Patient Position: Sitting, Cuff Size: Normal)   Pulse 86   Ht 5\' 2"  (1.575 m)   SpO2 92%   BMI 30.18 kg/m   BP Readings from Last 3 Encounters:  03/31/17 130/78  03/29/17 114/67  03/20/17 136/65    Wt Readings from Last 3 Encounters:  03/29/17 165 lb (74.8 kg)  03/16/17 165 lb 8 oz (75.1 kg)  03/02/17 161 lb 6 oz (73.2 kg)    Physical Exam  Constitutional: She appears well-developed and well-nourished. No distress.  HENT:  Head: Normocephalic and atraumatic.  Right Ear: External ear normal.  Left Ear: External ear normal.  Mouth/Throat: Oropharynx is clear and moist. No oropharyngeal exudate.  Eyes: Conjunctivae are normal. Pupils are equal, round, and reactive to light. Right eye exhibits no discharge. Left eye exhibits no discharge. No scleral icterus.  Neck:  Neck supple. No JVD present. No tracheal deviation present. No thyromegaly present.  Cardiovascular: Normal rate and normal heart sounds. An irregularly irregular rhythm present.  Pulmonary/Chest: Effort normal and breath sounds normal. No stridor.  Abdominal: Normal appearance. There is no tenderness.  Lymphadenopathy:    She has no cervical adenopathy.  Skin: She is not diaphoretic.       Lab Results  Component Value Date   WBC 9.3 03/29/2017   HGB 11.6 (L) 03/29/2017   HCT 37.2 03/29/2017   PLT 76 (L) 03/29/2017   GLUCOSE 111 (H) 03/29/2017   ALT 16 03/29/2017    AST 17 03/29/2017   NA 134 (L) 03/29/2017   K 2.6 (LL) 03/29/2017   CL 102 03/29/2017   CREATININE 0.99 03/29/2017   BUN 18 03/29/2017   CO2 26 03/29/2017   TSH 3.26 03/02/2017   INR 1.8 (A) 10/28/2016   HGBA1C 6.2 03/02/2017   MICROALBUR 35.7 (H) 03/02/2017    US Abdomen Complete  Result Date: 03/29/2017 CLINICAL DATA:  Abdominal pain, RIGHT back pain, known abdominal aortic aneurysm with stent graft. EXAM: ABDOMEN ULTRASOUND COMPLETE COMPARISON:  None. FINDINGS: Gallbladder: Mild sludge within gallbladder. No gallbladder distension. Negative sonographic Murphy's sign Common bile duct: Diameter: Normal at 4 mm Liver: No focal lesion identified. Within normal limits in parenchymal echogenicity. Portal vein is patent on color Doppler imaging with normal direction of blood flow towards the liver. IVC: No abnormality visualized. Pancreas: Visualized portion unremarkable. Spleen: Size and appearance within normal limits. Right Kidney: Length: 11.3 cm. The small anechoic cysts. No hydronephrosis Left Kidney: Length: 12.3 cm. Small anechoic cyst. No hydronephrosis Abdominal aorta: Stent in distal aorta.  Proximal aorta Other findings: Small RIGHT effusion. IMPRESSION: No acute abdominal findings by ultrasound.  Small RIGHT effusion. Minimal sludge within the gallbladder. No hydronephrosis. Electronically Signed   By: Suzy Bouchard M.D.   On: 03/29/2017 17:35    Assessment & Plan:   Dora was seen today for follow-up.  Diagnoses and all orders for this visit:  Hypokalemia -     Urinalysis, Routine w reflex microscopic; Future -     Basic metabolic panel -     Urinalysis, Routine w reflex microscopic  Hypomagnesemia -     Magnesium  Schamberg disease  Isolated proteinuria with morphologic lesion -     Protein / creatinine ratio, urine -     Protein Electrophoresis,Random Urn   I have discontinued Jacee Esson's traMADol. I am also having her maintain her diclofenac sodium,  acetaminophen, polyethylene glycol, warfarin, amLODipine, glipiZIDE, losartan, and metoprolol tartrate.  No orders of the defined types were placed in this encounter.  Suggested follow-up will pend results of labs.  Follow-up: No Follow-up on file.  [name], MD

## 2017-04-01 LAB — BASIC METABOLIC PANEL
BUN: 31 mg/dL — AB (ref 6–23)
CHLORIDE: 104 meq/L (ref 96–112)
CO2: 27 mEq/L (ref 19–32)
CREATININE: 1.1 mg/dL (ref 0.40–1.20)
Calcium: 9.5 mg/dL (ref 8.4–10.5)
GFR: 49.83 mL/min — ABNORMAL LOW (ref >60.00)
Glucose, Bld: 89 mg/dL (ref 70–99)
Potassium: 3.6 mEq/L (ref 3.5–5.1)
Sodium: 141 mEq/L (ref 135–145)

## 2017-04-01 LAB — URINALYSIS, ROUTINE W REFLEX MICROSCOPIC
BILIRUBIN URINE: NEGATIVE
HGB URINE DIPSTICK: NEGATIVE
Ketones, ur: NEGATIVE
NITRITE: NEGATIVE
PH: 5.5 (ref 5.0–8.0)
Specific Gravity, Urine: 1.015 (ref 1.000–1.030)
Total Protein, Urine: 30 — AB
Urine Glucose: NEGATIVE
Urobilinogen, UA: 0.2 (ref 0.0–1.0)

## 2017-04-01 LAB — MAGNESIUM: MAGNESIUM: 2 mg/dL (ref 1.5–2.5)

## 2017-04-04 LAB — PROTEIN ELECTROPHORESIS,RANDOM URN
ALPHA-1-GLOBULIN, U: 2 %
ALPHA-2-GLOBULIN, U: 8 %
Albumin: 65 %
BETA GLOBULIN, U: 14 %
Creatinine, Urine: 154 mg/dL (ref 20–275)
Gamma Globulin, U: 11 %
PROTEIN/CREAT RATIO: 338 mg/g{creat} — AB (ref 21–161)
TOTAL PROTEIN, URINE: 52 mg/dL — AB (ref 5–24)

## 2017-04-04 LAB — EXTRA URINE SPECIMEN

## 2017-04-22 ENCOUNTER — Ambulatory Visit (INDEPENDENT_AMBULATORY_CARE_PROVIDER_SITE_OTHER): Payer: Medicare Other | Admitting: Family Medicine

## 2017-04-22 ENCOUNTER — Encounter: Payer: Self-pay | Admitting: Family Medicine

## 2017-04-22 VITALS — BP 110/80 | HR 77 | Wt 161.0 lb

## 2017-04-22 DIAGNOSIS — L817 Pigmented purpuric dermatosis: Secondary | ICD-10-CM

## 2017-04-22 NOTE — Progress Notes (Signed)
Subjective:  Patient ID: Chelsea Dean, female    DOB: 02-09-1928  Age: 84 y.o. MRN: [id number]  CC: Follow-up   HPI Arnette Driggs presents for follow-up of her lower extremity rash and edema.  She is doing much better the rash is all but resolved.  She is no longer having any back pain or abdominal pain.  She is stooling normally.  Her appetite is good.  She is feeling much better.  She continues to attend the Coumadin clinic.  Outpatient Medications Prior to Visit  Medication Sig Dispense Refill  . acetaminophen (TYLENOL) 650 MG CR tablet Take 650 mg by mouth daily as needed for pain.    Marland Kitchen amLODipine (NORVASC) 10 MG tablet Take 1 tablet (10 mg total) by mouth daily. 90 tablet 1  . diclofenac sodium (VOLTAREN) 1 % GEL Apply 2 g topically daily as needed (KNEE PAIN).    Marland Kitchen glipiZIDE (GLUCOTROL XL) 10 MG 24 hr tablet Take 1 tablet (10 mg total) by mouth daily with breakfast. 90 tablet 1  . losartan (COZAAR) 100 MG tablet Take 1 tablet (100 mg total) by mouth daily. 90 tablet 1  . metoprolol tartrate (LOPRESSOR) 100 MG tablet Take 1 tablet (100 mg total) by mouth 2 (two) times daily. 180 tablet 1  . polyethylene glycol (MIRALAX / GLYCOLAX) packet Take 17 g by mouth daily. 14 each 0  . warfarin (COUMADIN) 2.5 MG tablet Take 2.5 mg by mouth daily. 2.5 mg daily except for Tuesday Thursday Saturday patient takes one half tablet (1.25 mg)     No facility-administered medications prior to visit.     ROS Review of Systems  Constitutional: Negative for fatigue and unexpected weight change.  Respiratory: Negative.   Cardiovascular: Negative.   Gastrointestinal: Negative.  Negative for abdominal pain.  Genitourinary: Negative for decreased urine volume.  Musculoskeletal: Negative for arthralgias and myalgias.  Skin: Negative.   Psychiatric/Behavioral: Negative.     Objective:  BP 110/80 (BP Location: Left Arm, Patient Position: Sitting, Cuff Size: Normal)   Pulse 77   Wt 161 lb (73 kg)    BMI 29.45 kg/m   BP Readings from Last 3 Encounters:  04/22/17 110/80  03/31/17 130/78  03/29/17 114/67    Wt Readings from Last 3 Encounters:  04/22/17 161 lb (73 kg)  03/29/17 165 lb (74.8 kg)  03/16/17 165 lb 8 oz (75.1 kg)    Physical Exam  Constitutional: She is oriented to person, place, and time. She appears well-developed and well-nourished. No distress.  Eyes: Right eye exhibits no discharge. Left eye exhibits no discharge. No scleral icterus.  Cardiovascular: An irregularly irregular rhythm present.  Pulmonary/Chest: Effort normal and breath sounds normal. She has no decreased breath sounds. She has no rhonchi. She has no rales.  Neurological: She is alert and oriented to person, place, and time.  Skin: Skin is warm and dry. She is not diaphoretic.  Rash on her legs has cleared.   Psychiatric: She has a normal mood and affect. Her behavior is normal.    Lab Results  Component Value Date   WBC 9.3 03/29/2017   HGB 11.6 (L) 03/29/2017   HCT 37.2 03/29/2017   PLT 76 (L) 03/29/2017   GLUCOSE 89 03/31/2017   ALT 16 03/29/2017   AST 17 03/29/2017   NA 141 03/31/2017   K 3.6 03/31/2017   CL 104 03/31/2017   CREATININE 1.10 03/31/2017   BUN 31 (H) 03/31/2017   CO2 27 03/31/2017   TSH  3.26 03/02/2017   INR 1.8 (A) 10/28/2016   HGBA1C 6.2 03/02/2017   MICROALBUR 35.7 (H) 03/02/2017    US Abdomen Complete  Result Date: 03/29/2017 CLINICAL DATA:  Abdominal pain, RIGHT back pain, known abdominal aortic aneurysm with stent graft. EXAM: ABDOMEN ULTRASOUND COMPLETE COMPARISON:  None. FINDINGS: Gallbladder: Mild sludge within gallbladder. No gallbladder distension. Negative sonographic Murphy's sign Common bile duct: Diameter: Normal at 4 mm Liver: No focal lesion identified. Within normal limits in parenchymal echogenicity. Portal vein is patent on color Doppler imaging with normal direction of blood flow towards the liver. IVC: No abnormality visualized. Pancreas:  Visualized portion unremarkable. Spleen: Size and appearance within normal limits. Right Kidney: Length: 11.3 cm. The small anechoic cysts. No hydronephrosis Left Kidney: Length: 12.3 cm. Small anechoic cyst. No hydronephrosis Abdominal aorta: Stent in distal aorta.  Proximal aorta Other findings: Small RIGHT effusion. IMPRESSION: No acute abdominal findings by ultrasound.  Small RIGHT effusion. Minimal sludge within the gallbladder. No hydronephrosis. Electronically Signed   By: Suzy Bouchard M.D.   On: 03/29/2017 17:35    Assessment & Plan:   Latha was seen today for follow-up.  Diagnoses and all orders for this visit:  Schamberg disease   I am having Sharmaine Base maintain her diclofenac sodium, acetaminophen, polyethylene glycol, warfarin, amLODipine, glipiZIDE, losartan, and metoprolol tartrate.  No orders of the defined types were placed in this encounter.    Follow-up: Return in about 3 months (around 07/23/2017).  [name], MD

## 2017-05-07 ENCOUNTER — Other Ambulatory Visit: Payer: Self-pay | Admitting: Family Medicine

## 2017-06-15 ENCOUNTER — Ambulatory Visit: Payer: Medicare Other | Admitting: Family Medicine

## 2017-06-21 ENCOUNTER — Other Ambulatory Visit: Payer: Self-pay

## 2017-06-21 ENCOUNTER — Emergency Department (HOSPITAL_BASED_OUTPATIENT_CLINIC_OR_DEPARTMENT_OTHER)
Admission: EM | Admit: 2017-06-21 | Discharge: 2017-06-21 | Disposition: A | Discharge status: Home / Self Care | Payer: Medicare Other | Attending: Emergency Medicine | Admitting: Emergency Medicine

## 2017-06-21 ENCOUNTER — Emergency Department (HOSPITAL_BASED_OUTPATIENT_CLINIC_OR_DEPARTMENT_OTHER): Payer: Medicare Other

## 2017-06-21 ENCOUNTER — Encounter (HOSPITAL_BASED_OUTPATIENT_CLINIC_OR_DEPARTMENT_OTHER): Payer: Self-pay | Admitting: *Deleted

## 2017-06-21 DIAGNOSIS — Z79899 Other long term (current) drug therapy: Secondary | ICD-10-CM | POA: Insufficient documentation

## 2017-06-21 DIAGNOSIS — I10 Essential (primary) hypertension: Secondary | ICD-10-CM | POA: Insufficient documentation

## 2017-06-21 DIAGNOSIS — Z7901 Long term (current) use of anticoagulants: Secondary | ICD-10-CM | POA: Insufficient documentation

## 2017-06-21 DIAGNOSIS — N132 Hydronephrosis with renal and ureteral calculous obstruction: Secondary | ICD-10-CM

## 2017-06-21 DIAGNOSIS — E119 Type 2 diabetes mellitus without complications: Secondary | ICD-10-CM | POA: Diagnosis not present

## 2017-06-21 DIAGNOSIS — F17220 Nicotine dependence, chewing tobacco, uncomplicated: Secondary | ICD-10-CM | POA: Insufficient documentation

## 2017-06-21 DIAGNOSIS — R109 Unspecified abdominal pain: Secondary | ICD-10-CM | POA: Diagnosis present

## 2017-06-21 LAB — URINALYSIS, MICROSCOPIC (REFLEX)

## 2017-06-21 LAB — CBC WITH DIFFERENTIAL/PLATELET
BASOS ABS: 0 10*3/uL (ref 0.0–0.1)
BASOS PCT: 0 %
Eosinophils Absolute: 0.1 10*3/uL (ref 0.0–0.7)
Eosinophils Relative: 1 %
HEMATOCRIT: 42 % (ref 36.0–46.0)
HEMOGLOBIN: 13.5 g/dL (ref 12.0–15.0)
Lymphocytes Relative: 7 %
Lymphs Abs: 0.6 10*3/uL — ABNORMAL LOW (ref 0.7–4.0)
MCH: 26.7 pg (ref 26.0–34.0)
MCHC: 32.1 g/dL (ref 30.0–36.0)
MCV: 83 fL (ref 78.0–100.0)
Monocytes Absolute: 0.6 10*3/uL (ref 0.1–1.0)
Monocytes Relative: 7 %
NEUTROS ABS: 7.2 10*3/uL (ref 1.7–7.7)
NEUTROS PCT: 85 %
Platelets: 103 10*3/uL — ABNORMAL LOW (ref 150–400)
RBC: 5.06 MIL/uL (ref 3.87–5.11)
RDW: 15.8 % — AB (ref 11.5–15.5)
WBC: 8.5 10*3/uL (ref 4.0–10.5)

## 2017-06-21 LAB — BASIC METABOLIC PANEL
Anion gap: 8 (ref 5–15)
BUN: 32 mg/dL — ABNORMAL HIGH (ref 6–20)
CHLORIDE: 109 mmol/L (ref 101–111)
CO2: 21 mmol/L — AB (ref 22–32)
CREATININE: 1.5 mg/dL — AB (ref 0.44–1.00)
Calcium: 10.2 mg/dL (ref 8.9–10.3)
GFR calc non Af Amer: 30 mL/min — ABNORMAL LOW (ref >60)
GFR, EST AFRICAN AMERICAN: 35 mL/min — AB (ref >60)
GLUCOSE: 159 mg/dL — AB (ref 65–99)
POTASSIUM: 4.1 mmol/L (ref 3.5–5.1)
Sodium: 138 mmol/L (ref 135–145)

## 2017-06-21 LAB — URINALYSIS, ROUTINE W REFLEX MICROSCOPIC
Bilirubin Urine: NEGATIVE
Glucose, UA: NEGATIVE mg/dL
KETONES UR: 15 mg/dL — AB
LEUKOCYTES UA: NEGATIVE
NITRITE: NEGATIVE
PROTEIN: 100 mg/dL — AB
Specific Gravity, Urine: 1.025 (ref 1.005–1.030)
pH: 5.5 (ref 5.0–8.0)

## 2017-06-21 MED ORDER — HYDROCODONE-ACETAMINOPHEN 5-325 MG PO TABS: 0.5000 | ORAL_TABLET | Freq: Three times a day (TID) | ORAL | 0 refills | Status: AC | PRN

## 2017-06-21 MED ORDER — ACETAMINOPHEN 500 MG PO TABS: 1000.0000 mg | ORAL_TABLET | Freq: Three times a day (TID) | ORAL | 0 refills | Status: AC

## 2017-06-21 MED ORDER — TAMSULOSIN HCL 0.4 MG PO CAPS: 0.4000 mg | ORAL_CAPSULE | Freq: Every day | ORAL | 0 refills | Status: AC

## 2017-06-21 MED ORDER — HYDROCODONE-ACETAMINOPHEN 5-325 MG PO TABS
1.0000 | ORAL_TABLET | Freq: Once | ORAL | Status: AC
  Administered 2017-06-21: 1 via ORAL
  Filled 2017-06-21: qty 1

## 2017-06-21 NOTE — ED Triage Notes (Signed)
Pt reports sudden onset of her usual flank pain associated with passing a kidney stone this am, worsening throughout the day. Denies hematuria or dysuria.

## 2017-06-21 NOTE — ED Provider Notes (Signed)
[facility] EMERGENCY DEPARTMENT Provider Note  CSN: [id number] Arrival date & time: 06/21/17 1731  Chief Complaint(s) Flank Pain  HPI Chelsea Dean is a 84 y.o. female with extensive past medical history listed below including prior renal stones who presents to the emergency department with several hours of left flank pain described as constant but fluctuating.  Stabbing in nature.  Moderate to severe.  No alleviating or aggravating factors.  Associated dysuria.  She is also endorsing nausea and nonbloody nonbilious emesis.  No abdominal pain or diarrhea.  No chest pain or shortness of breath.  Denies any other physical complaints at this time.  HPI  Past Medical History Past Medical History:  Diagnosis Date  . A-fib ([place]) 10/19/2016  . Abdominal aortic aneurysm (AAA) without rupture ([place]) 09/15/2013  . Acute idiopathic gout of right foot   . AKI (acute kidney injury) ([place]) 10/18/2016  . Anticoagulated on Coumadin   . Arthritis   . Diabetes mellitus without complication ([place])   . Diverticular disease of large intestine 04/16/2013  . Diverticulosis   . DM2 (diabetes mellitus, type 2) ([place]) 10/19/2016  . Dysrhythmia    Atrial fib  . History of colon polyps 04/16/2013  . Hx of colonic polyps   . Hyperlipidemia   . Hypertension   . Long term current use of anticoagulant therapy 02/05/2015  . Lumbar radiculopathy 03/28/2014  . Microalbuminuria 04/16/2013  . Osteoarthritis 04/16/2013  . Osteoarthritis of cervical spine 03/28/2014  . Pelvic mass 10/19/2016  . Right renal mass   . SBO (small bowel obstruction) ([place]) 10/18/2016  . Thrombocytopenia ([place]) 04/16/2013   Overview:  referred to hematology 01/2104   Patient Active Problem List   Diagnosis Date Noted  . Hypomagnesemia 03/31/2017  . 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/31/2017  . Abnormal urinalysis 03/16/2017  . Hypokalemia 10/31/2016  . Lactic acidosis 10/31/2016  . Atrial fibrillation with RVR ([place]) 10/31/2016  . Elevated liver enzymes  10/31/2016  . Anticoagulated on Coumadin 10/31/2016  . Elevated INR 10/31/2016  . Hyperlipidemia 10/31/2016  . Hypertension 10/31/2016  . Acute idiopathic gout of right foot   . A-fib ([place]) 10/19/2016  . DM2 (diabetes mellitus, type 2) ([place]) 10/19/2016  . Pelvic mass 10/19/2016  . SBO (small bowel obstruction) ([place]) 10/18/2016  . AKI (acute kidney injury) ([place]) 10/18/2016  . Long term current use of anticoagulant therapy 02/05/2015  . Right renal mass   . Lumbar radiculopathy 03/28/2014  . Osteoarthritis of cervical spine 03/28/2014  . Abdominal aortic aneurysm (AAA) without rupture ([place]) 09/15/2013  . Diverticular disease of large intestine 04/16/2013  . History of colon polyps 04/16/2013  . Microalbuminuria 04/16/2013  . Osteoarthritis 04/16/2013  . Thrombocytopenia ([place]) 04/16/2013   Home Medication(s) Prior to Admission medications   Medication Sig Start Date End Date Taking? Authorizing Provider  acetaminophen (TYLENOL) 500 MG tablet Take 2 tablets (1,000 mg total) by mouth every 8 (eight) hours for 5 days. Do not take more than 4000 mg of acetaminophen (Tylenol) in a 24-hour period. Please note that other medicines that you may be prescribed may have Tylenol as well. 06/21/17 06/26/17  Fatima Blank, MD  amLODipine (NORVASC) 10 MG tablet Take 1 tablet (10 mg total) by mouth daily. 03/02/17   [name], MD  diclofenac sodium (VOLTAREN) 1 % GEL Apply 2 g topically daily as needed (KNEE PAIN).    [provider]  glipiZIDE (GLUCOTROL XL) 10 MG 24 hr tablet Take 1 tablet (10 mg total) by mouth daily  with breakfast. 03/02/17   [name], MD  HYDROcodone-acetaminophen (NORCO/VICODIN) 5-325 MG tablet Take 0.5-1 tablets by mouth every 8 (eight) hours as needed for up to 5 days for severe pain (That is not improved by your scheduled acetaminophen regimen). Please do not exceed 4000 mg of acetaminophen (Tylenol) a 24-hour period. Please note that he may  be prescribed additional medicine that contains acetaminophen. 06/21/17 06/26/17  Fatima Blank, MD  losartan (COZAAR) 100 MG tablet Take 1 tablet (100 mg total) by mouth daily. 03/02/17   [name], MD  metoprolol tartrate (LOPRESSOR) 100 MG tablet Take 1 tablet (100 mg total) by mouth 2 (two) times daily. 03/02/17   [name], MD  polyethylene glycol [facility] / Floria Raveling) packet Take 17 g by mouth daily. 10/28/16   Dessa Phi, DO  potassium chloride SA (K-DUR,KLOR-CON) 20 MEQ tablet TAKE ONE BY MOUTH DAILY 05/09/17   [name], MD  tamsulosin (FLOMAX) 0.4 MG CAPS capsule Take 1 capsule (0.4 mg total) by mouth daily for 7 days. 06/21/17 06/28/17  Fatima Blank, MD  warfarin (COUMADIN) 2.5 MG tablet Take 2.5 mg by mouth daily. 2.5 mg daily except for Tuesday Thursday Saturday patient takes one half tablet (1.25 mg)    [provider]                                                                                                                                    Past Surgical History Past Surgical History:  Procedure Laterality Date  . ABDOMINAL AORTIC ANEURYSM REPAIR    . ABDOMINAL HYSTERECTOMY    . IR GENERIC HISTORICAL  07/01/2015   IR RADIOLOGIST EVAL & MGMT 07/01/2015 [facility] INTERV RAD   Family History Family History  Problem Relation Age of Onset  . Cancer Sister        Rectal and Stomach    Social History Social History   Tobacco Use  . Smoking status: Never Smoker  . Smokeless tobacco: Current User    Types: Snuff  Substance Use Topics  . Alcohol use: No    Alcohol/week: 0.0 oz  . Drug use: No   Allergies Ace inhibitors; Contrast media [iodinated diagnostic agents]; and Nsaids  Review of Systems Review of Systems All other systems are reviewed and are negative for acute change except as noted in the HPI  Physical Exam Vital Signs  I have reviewed the triage vital signs BP (!) 156/88 (BP Location: Right Arm)    Pulse 72   Temp 97.9 F (36.6 C) (Oral)   Resp 20   SpO2 97%   Physical Exam  Constitutional: She is oriented to person, place, and time. She appears well-developed and well-nourished. No distress.  HENT:  Head: Normocephalic and atraumatic.  Right Ear: External ear normal.  Left Ear: External ear normal.  Nose: Nose normal.  Eyes: Conjunctivae and EOM are normal. No scleral icterus.  Neck: Normal range of motion and phonation normal.  Cardiovascular: Normal rate and regular rhythm.  Pulmonary/Chest: Effort normal. No stridor. No respiratory distress.  Abdominal: She exhibits no distension. There is no tenderness. There is no rigidity, no rebound, no guarding and no CVA tenderness.  Musculoskeletal: Normal range of motion. She exhibits no edema.  Neurological: She is alert and oriented to person, place, and time.  Skin: She is not diaphoretic.  Psychiatric: She has a normal mood and affect. Her behavior is normal.  Vitals reviewed.   ED Results and Treatments Labs (all labs ordered are listed, but only abnormal results are displayed) Labs Reviewed  URINALYSIS, ROUTINE W REFLEX MICROSCOPIC - Abnormal; Notable for the following components:      Result Value   Hgb urine dipstick LARGE (*)    Ketones, ur 15 (*)    Protein, ur 100 (*)    All other components within normal limits  CBC WITH DIFFERENTIAL/PLATELET - Abnormal; Notable for the following components:   RDW 15.8 (*)    Platelets 103 (*)    Lymphs Abs 0.6 (*)    All other components within normal limits  BASIC METABOLIC PANEL - Abnormal; Notable for the following components:   CO2 21 (*)    Glucose, Bld 159 (*)    BUN 32 (*)    Creatinine, Ser 1.50 (*)    GFR calc non Af Amer 30 (*)    GFR calc Af Amer 35 (*)    All other components within normal limits  URINALYSIS, MICROSCOPIC (REFLEX) - Abnormal; Notable for the following components:   Bacteria, UA FEW (*)    All other components within normal limits                                                                                                                          EKG  EKG Interpretation  Date/Time:    Ventricular Rate:    PR Interval:    QRS Duration:   QT Interval:    QTC Calculation:   R Axis:     Text Interpretation:        Radiology Ct Renal Stone Study  Result Date: 06/21/2017 CLINICAL DATA:  Acute left flank pain. EXAM: CT ABDOMEN AND PELVIS WITHOUT CONTRAST TECHNIQUE: Multidetector CT imaging of the abdomen and pelvis was performed following the standard protocol without IV contrast. COMPARISON:  CT scan of March 20, 2017. FINDINGS: Lower chest: No acute abnormality. Hepatobiliary: No focal liver abnormality is seen. No gallstones, gallbladder wall thickening, or biliary dilatation. Pancreas: Unremarkable. No pancreatic ductal dilatation or surrounding inflammatory changes. Spleen: Normal in size without focal abnormality. Adrenals/Urinary Tract: Adrenal glands appear normal. Stable right nephrolithiasis is noted. Mild left hydroureteronephrosis with perinephric stranding is noted secondary to 3 mm calculus at the left ureterovesical junction. Stomach/Bowel: Stomach is within normal limits. Appendix appears normal. No evidence of bowel wall thickening, distention, or inflammatory changes. Diverticulosis of descending and sigmoid colon is noted without  inflammation. Vascular/Lymphatic: Status post stent graft repair of infrarenal abdominal aortic aneurysm. Excluded aneurysmal sac measures 7.8 cm in maximum transverse diameter which is slightly increased compared to prior exam. Reproductive: 10 x 6 cm fluid collection is noted in right pelvis which is unchanged compared to prior exam. Status post hysterectomy. No left adnexal abnormality is noted. Other: No abdominal wall hernia or abnormality. No abdominopelvic ascites. Musculoskeletal: No acute or significant osseous findings. IMPRESSION: Mild left hydroureteronephrosis with perinephric  stranding is noted secondary to 3 mm calculus at the left ureterovesical junction. Stable nonobstructive right nephrolithiasis is noted. Status post stent graft repair of infrarenal abdominal aortic aneurysm. Excluded aneurysmal sac measures 7.8 cm in maximum transverse diameter which is slightly increased compared to prior exam. CTA of the abdomen and pelvis is recommended to evaluate for possible endoleak. Diverticulosis of descending and sigmoid colon is noted without inflammation. Stable size and appearance of 10 x 6 fluid collection and right pelvis. This may represent large ovarian cyst, ovarian cyst remnant, seroma or lymphocele. Electronically Signed   By: Marijo Conception, M.D.   On: 06/21/2017 19:57   Pertinent labs & imaging results that were available during my care of the patient were reviewed by me and considered in my medical decision making (see chart for details).  Medications Ordered in ED Medications  HYDROcodone-acetaminophen (NORCO/VICODIN) 5-325 MG per tablet 1 tablet (1 tablet Oral Given 06/21/17 2128)                                                                                                                                    Procedures Procedures EMERGENCY DEPARTMENT US RENAL EXAM  "Study: Limited Retroperitoneal Ultrasound of Kidneys"  INDICATIONS: Flank pain Long and short axis of both kidneys were obtained.   PERFORMED BY: Myself IMAGES ARCHIVED?: Yes LIMITATIONS: Body habitus VIEWS USED: Long axis and Short axis  INTERPRETATION: Left  Hydronephrosis moderate    (including critical care time)  Medical Decision Making / ED Course I have reviewed the nursing notes for this encounter and the patient's prior records (if available in EHR or on provided paperwork).    POCUS concerning for obstructing stone. Confirmed with CT. UA w/o infection. Labs reassuring w/o leukocytosis, anemia. She does have mild renal insufficiency. Pain controlled with oral  medicaiton.  The patient appears reasonably screened and/or stabilized for discharge and I doubt any other medical condition or other [facility] requiring further screening, evaluation, or treatment in the ED at this time prior to discharge.  The patient is safe for discharge with strict return precautions.   Final Clinical Impression(s) / ED Diagnoses Final diagnoses:  Hydronephrosis with urinary obstruction due to ureteral calculus   Disposition: Discharge  Condition: Good  I have discussed the results, Dx and Tx plan with the patient and family who expressed understanding and agree(s) with the plan. Discharge instructions discussed at great length. The patient and family were given  strict return precautions who verbalized understanding of the instructions. No further questions at time of discharge.    ED Discharge Orders        Ordered    acetaminophen (TYLENOL) 500 MG tablet  Every 8 hours     06/21/17 2217    HYDROcodone-acetaminophen (NORCO/VICODIN) 5-325 MG tablet  Every 8 hours PRN     06/21/17 2217    tamsulosin (FLOMAX) 0.4 MG CAPS capsule  Daily     06/21/17 2217       Follow Up: Lucas Mallow, MD [place] [id number] [phone]  Schedule an appointment as soon as possible for a visit  to follow up for recurrent renal stones, If symptoms do not improve or  worsen  [name], MD [place] [address] [phone]  Call  As needed      This chart was dictated using voice recognition software.  Despite best efforts to proofread,  errors can occur which can change the documentation meaning.   Fatima Blank, MD 06/22/17 [phone]

## 2017-06-21 NOTE — ED Notes (Signed)
EDP at BS 

## 2017-06-21 NOTE — ED Notes (Signed)
Alert, NAD, calm, passively interactive, resps e/u, speaking clearly, no dyspnea noted, skin W&D, VSS, mentions back pain, (denies: sob, nausea, dizziness or visual changes). Family x2 at [facility].

## 2017-07-15 ENCOUNTER — Other Ambulatory Visit: Payer: Self-pay | Admitting: Family Medicine

## 2017-07-25 ENCOUNTER — Encounter: Payer: Self-pay | Admitting: Family Medicine

## 2017-07-25 ENCOUNTER — Ambulatory Visit (INDEPENDENT_AMBULATORY_CARE_PROVIDER_SITE_OTHER): Payer: Medicare Other | Admitting: Family Medicine

## 2017-07-25 VITALS — BP 128/80 | HR 77 | Ht 62.0 in | Wt 160.0 lb

## 2017-07-25 DIAGNOSIS — D696 Thrombocytopenia, unspecified: Secondary | ICD-10-CM

## 2017-07-25 DIAGNOSIS — N183 Chronic kidney disease, stage 3 unspecified: Secondary | ICD-10-CM | POA: Insufficient documentation

## 2017-07-25 DIAGNOSIS — N184 Chronic kidney disease, stage 4 (severe): Secondary | ICD-10-CM

## 2017-07-25 DIAGNOSIS — I10 Essential (primary) hypertension: Secondary | ICD-10-CM

## 2017-07-25 DIAGNOSIS — E119 Type 2 diabetes mellitus without complications: Secondary | ICD-10-CM | POA: Diagnosis not present

## 2017-07-25 DIAGNOSIS — I482 Chronic atrial fibrillation, unspecified: Secondary | ICD-10-CM

## 2017-07-25 NOTE — Progress Notes (Signed)
Subjective:  Patient ID: Chelsea Dean, female    DOB: [birth year]  Age: 84 y.o. MRN: [id number]  CC: Follow-up   HPI Chelsea Dean presents for follow-up after her husband's recent death.  Family is with her frequently during the day but she is spending her nights alone.  She is okay with this.  She is sleeping.  She is at peace with her life.  Obviously things are quieter at home and there is less stress.  She is drinking as much water as she can.  She tells me explicitly today that she does not want to be resuscitated should she suffer cardiac arrest.  They are planning on seeing attorney.  She has been getting out of the house but admits that she is most comfortable at home.  She does not have a faith community at this time.  She continues to eat a full breakfast every morning after taking her glipizide.  Outpatient Medications Prior to Visit  Medication Sig Dispense Refill  . amLODipine (NORVASC) 10 MG tablet Take 1 tablet (10 mg total) by mouth daily. 90 tablet 1  . diclofenac sodium (VOLTAREN) 1 % GEL Apply 2 g topically daily as needed (KNEE PAIN).    Marland Kitchen glipiZIDE (GLUCOTROL XL) 10 MG 24 hr tablet Take 1 tablet (10 mg total) by mouth daily with breakfast. 90 tablet 1  . losartan (COZAAR) 100 MG tablet Take 1 tablet (100 mg total) by mouth daily. 90 tablet 1  . metoprolol tartrate (LOPRESSOR) 100 MG tablet Take 1 tablet (100 mg total) by mouth 2 (two) times daily. 180 tablet 1  . polyethylene glycol (MIRALAX / GLYCOLAX) packet Take 17 g by mouth daily. 14 each 0  . warfarin (COUMADIN) 2.5 MG tablet Take 2.5 mg by mouth daily. 2.5 mg daily except for Tuesday Thursday Saturday patient takes one half tablet (1.25 mg)    . potassium chloride SA (K-DUR,KLOR-CON) 20 MEQ tablet TAKE ONE TABLET BY MOUTH DAILY 30 tablet 2   No facility-administered medications prior to visit.     ROS Review of Systems  Constitutional: Negative for chills, fatigue, fever and unexpected weight change.  HENT:  Negative.   Eyes: Negative.   Respiratory: Negative for chest tightness and shortness of breath.   Cardiovascular: Negative for chest pain and palpitations.  Gastrointestinal: Negative.  Negative for anal bleeding and blood in stool.  Endocrine: Negative for polyphagia and polyuria.  Genitourinary: Negative for decreased urine volume, difficulty urinating, frequency and hematuria.  Skin: Negative.   Neurological: Negative for weakness and headaches.  Hematological: Does not bruise/bleed easily.  Psychiatric/Behavioral: Negative.     Objective:  BP 128/80   Pulse 77   Ht 5\' 2"  (1.575 m)   Wt 160 lb (72.6 kg)   SpO2 98%   BMI 29.26 kg/m   BP Readings from Last 3 Encounters:  07/25/17 128/80  06/21/17 (!) 156/65  04/22/17 110/80    Wt Readings from Last 3 Encounters:  07/25/17 160 lb (72.6 kg)  04/22/17 161 lb (73 kg)  03/29/17 165 lb (74.8 kg)    Physical Exam  Constitutional: She appears well-developed and well-nourished. No distress.  HENT:  Head: Normocephalic and atraumatic.  Right Ear: External ear normal.  Left Ear: External ear normal.  Mouth/Throat: Oropharynx is clear and moist.  Eyes: Pupils are equal, round, and reactive to light. Conjunctivae and EOM are normal. Right eye exhibits no discharge. Left eye exhibits no discharge. No scleral icterus.  Neck: No JVD present. No  tracheal deviation present. No thyromegaly present.  Cardiovascular: An irregularly irregular rhythm present.  Pulmonary/Chest: Effort normal and breath sounds normal.  Musculoskeletal: She exhibits edema (trace to 1 plus).  Lymphadenopathy:    She has no cervical adenopathy.  Skin: She is not diaphoretic.    Lab Results  Component Value Date   WBC 8.5 06/21/2017   HGB 13.5 06/21/2017   HCT 42.0 06/21/2017   PLT 103 (L) 06/21/2017   GLUCOSE 159 (H) 06/21/2017   ALT 16 03/29/2017   AST 17 03/29/2017   NA 138 06/21/2017   K 4.1 06/21/2017   CL 109 06/21/2017   CREATININE 1.50 (H)  06/21/2017   BUN 32 (H) 06/21/2017   CO2 21 (L) 06/21/2017   TSH 3.26 03/02/2017   INR 1.8 (A) 10/28/2016   HGBA1C 6.2 03/02/2017   MICROALBUR 35.7 (H) 03/02/2017    Ct Renal Stone Study  Result Date: 06/21/2017 CLINICAL DATA:  Acute left flank pain. EXAM: CT ABDOMEN AND PELVIS WITHOUT CONTRAST TECHNIQUE: Multidetector CT imaging of the abdomen and pelvis was performed following the standard protocol without IV contrast. COMPARISON:  CT scan of March 20, 2017. FINDINGS: Lower chest: No acute abnormality. Hepatobiliary: No focal liver abnormality is seen. No gallstones, gallbladder wall thickening, or biliary dilatation. Pancreas: Unremarkable. No pancreatic ductal dilatation or surrounding inflammatory changes. Spleen: Normal in size without focal abnormality. Adrenals/Urinary Tract: Adrenal glands appear normal. Stable right nephrolithiasis is noted. Mild left hydroureteronephrosis with perinephric stranding is noted secondary to 3 mm calculus at the left ureterovesical junction. Stomach/Bowel: Stomach is within normal limits. Appendix appears normal. No evidence of bowel wall thickening, distention, or inflammatory changes. Diverticulosis of descending and sigmoid colon is noted without inflammation. Vascular/Lymphatic: Status post stent graft repair of infrarenal abdominal aortic aneurysm. Excluded aneurysmal sac measures 7.8 cm in maximum transverse diameter which is slightly increased compared to prior exam. Reproductive: 10 x 6 cm fluid collection is noted in right pelvis which is unchanged compared to prior exam. Status post hysterectomy. No left adnexal abnormality is noted. Other: No abdominal wall hernia or abnormality. No abdominopelvic ascites. Musculoskeletal: No acute or significant osseous findings. IMPRESSION: Mild left hydroureteronephrosis with perinephric stranding is noted secondary to 3 mm calculus at the left ureterovesical junction. Stable nonobstructive right nephrolithiasis is  noted. Status post stent graft repair of infrarenal abdominal aortic aneurysm. Excluded aneurysmal sac measures 7.8 cm in maximum transverse diameter which is slightly increased compared to prior exam. CTA of the abdomen and pelvis is recommended to evaluate for possible endoleak. Diverticulosis of descending and sigmoid colon is noted without inflammation. Stable size and appearance of 10 x 6 fluid collection and right pelvis. This may represent large ovarian cyst, ovarian cyst remnant, seroma or lymphocele. Electronically Signed   By: Marijo Conception, M.D.   On: 06/21/2017 19:57    Assessment & Plan:   Chelsea Dean was seen today for follow-up.  Diagnoses and all orders for this visit:  Chronic atrial fibrillation ([place]) -     Basic metabolic panel  Essential hypertension  Thrombocytopenia (HCC) -     CBC  Hypomagnesemia -     Magnesium  Stage 4 chronic kidney disease (HCC) -     Basic metabolic panel -     CBC -     Urinalysis, Routine w reflex microscopic  Type 2 diabetes mellitus without complication, without long-term current use of insulin (HCC) -     Hemoglobin A1c   I have discontinued Joaquim Lai Schranz's  potassium chloride SA. I am also having her maintain her diclofenac sodium, polyethylene glycol, warfarin, amLODipine, glipiZIDE, losartan, and metoprolol tartrate.  No orders of the defined types were placed in this encounter.  Patient will see her attorney for living will.  Advised her to have any DNR documentation readily available.  Encouraged her to drink plenty of water.   Follow-up: Return in about 1 month (around 08/24/2017).  [name], MD

## 2017-07-25 NOTE — Patient Instructions (Signed)
Chronic Kidney Disease, Adult Chronic kidney disease (CKD) happens when the kidneys are damaged during a time of 3 or more months. The kidneys are two organs that do many important jobs in the body. These jobs include:  Removing wastes and extra fluids from the blood.  Making hormones that maintain the amount of fluid in your tissues and blood vessels.  Making sure that the body has the right amount of fluids and chemicals.  Most of the time, this condition does not go away, but it can usually be controlled. Steps must be taken to slow down the kidney damage or stop it from getting worse. Otherwise, the kidneys may stop working. Follow these instructions at home:  Follow your diet as told by your doctor. You may need to avoid alcohol, salty foods (sodium), and foods that are high in potassium, calcium, and protein.  Take over-the-counter and prescription medicines only as told by your doctor. Do not take any new medicines unless your doctor says you can do that. These include vitamins and minerals. ? Medicines and nutritional supplements can make kidney damage worse. ? Your doctor may need to change how much medicine you take.  Do not use any tobacco products. These include cigarettes, chewing tobacco, and e-cigarettes. If you need help quitting, ask your doctor.  Keep all follow-up visits as told by your doctor. This is important.  Check your blood pressure. Tell your doctor if there are changes to your blood pressure.  Get to a healthy weight. Stay at that weight. If you need help with this, ask your doctor.  Start or continue an exercise plan. Try to exercise at least 30 minutes a day, 5 days a week.  Stay up-to-date with your shots (immunizations) as told by your doctor. Contact a doctor if:  Your symptoms get worse.  You have new symptoms. Get help right away if:  You have symptoms of end-stage kidney disease. These include: ? Headaches. ? Skin that is darker or lighter  than normal. ? Numbness in your hands or feet. ? Easy bruising. ? Having hiccups often. ? Chest pain. ? Shortness of breath. ? Stopping of menstrual periods in women.  You have a fever.  You are making very little pee (urine).  You have pain or bleeding when you pee (urinate). This information is not intended to replace advice given to you by your health care provider. Make sure you discuss any questions you have with your health care provider. Document Released: 04/28/2009 Document Revised: 07/10/2015 Document Reviewed: 10/01/2011 Elsevier Interactive Patient Education  2017 Elsevier Inc.  

## 2017-07-26 LAB — CBC
HEMATOCRIT: 39.9 % (ref 36.0–46.0)
Hemoglobin: 12.5 g/dL (ref 12.0–15.0)
MCHC: 31.4 g/dL (ref 30.0–36.0)
MCV: 83.2 fl (ref 78.0–100.0)
Platelets: 97 10*3/uL — ABNORMAL LOW (ref 150.0–400.0)
RBC: 4.79 Mil/uL (ref 3.87–5.11)
RDW: 16.3 % — AB (ref 11.5–15.5)
WBC: 5.9 10*3/uL (ref 4.0–10.5)

## 2017-07-26 LAB — HEMOGLOBIN A1C: Hgb A1c MFr Bld: 6.2 % (ref 4.6–6.5)

## 2017-07-26 LAB — BASIC METABOLIC PANEL
BUN: 27 mg/dL — AB (ref 6–23)
CHLORIDE: 110 meq/L (ref 96–112)
CO2: 25 meq/L (ref 19–32)
Calcium: 11.4 mg/dL — ABNORMAL HIGH (ref 8.4–10.5)
Creatinine, Ser: 0.99 mg/dL (ref 0.40–1.20)
GFR: 56.24 mL/min — ABNORMAL LOW (ref >60.00)
Glucose, Bld: 118 mg/dL — ABNORMAL HIGH (ref 70–99)
Potassium: 5.6 mEq/L — ABNORMAL HIGH (ref 3.5–5.1)
Sodium: 144 mEq/L (ref 135–145)

## 2017-07-26 LAB — URINALYSIS, ROUTINE W REFLEX MICROSCOPIC
Bilirubin Urine: NEGATIVE
Hgb urine dipstick: NEGATIVE
Ketones, ur: NEGATIVE
Leukocytes, UA: NEGATIVE
Nitrite: NEGATIVE
PH: 5 (ref 5.0–8.0)
RBC / HPF: NONE SEEN (ref 0–?)
TOTAL PROTEIN, URINE-UPE24: 30 — AB
URINE GLUCOSE: NEGATIVE
Urobilinogen, UA: 0.2 (ref 0.0–1.0)

## 2017-07-26 LAB — MAGNESIUM: Magnesium: 1.7 mg/dL (ref 1.5–2.5)

## 2017-08-14 ENCOUNTER — Other Ambulatory Visit: Payer: Self-pay | Admitting: Family Medicine

## 2017-08-14 DIAGNOSIS — I10 Essential (primary) hypertension: Secondary | ICD-10-CM

## 2017-08-14 DIAGNOSIS — I482 Chronic atrial fibrillation, unspecified: Secondary | ICD-10-CM

## 2017-08-14 DIAGNOSIS — E119 Type 2 diabetes mellitus without complications: Secondary | ICD-10-CM

## 2017-08-15 ENCOUNTER — Emergency Department (HOSPITAL_BASED_OUTPATIENT_CLINIC_OR_DEPARTMENT_OTHER): Payer: Medicare Other

## 2017-08-15 ENCOUNTER — Other Ambulatory Visit: Payer: Self-pay

## 2017-08-15 ENCOUNTER — Encounter (HOSPITAL_BASED_OUTPATIENT_CLINIC_OR_DEPARTMENT_OTHER): Payer: Self-pay | Admitting: *Deleted

## 2017-08-15 ENCOUNTER — Emergency Department (HOSPITAL_BASED_OUTPATIENT_CLINIC_OR_DEPARTMENT_OTHER)
Admission: EM | Admit: 2017-08-15 | Discharge: 2017-08-15 | Disposition: A | Discharge status: Home / Self Care | Payer: Medicare Other | Attending: Emergency Medicine | Admitting: Emergency Medicine

## 2017-08-15 DIAGNOSIS — N183 Chronic kidney disease, stage 3 (moderate): Secondary | ICD-10-CM | POA: Diagnosis not present

## 2017-08-15 DIAGNOSIS — Z7901 Long term (current) use of anticoagulants: Secondary | ICD-10-CM | POA: Diagnosis not present

## 2017-08-15 DIAGNOSIS — I129 Hypertensive chronic kidney disease with stage 1 through stage 4 chronic kidney disease, or unspecified chronic kidney disease: Secondary | ICD-10-CM | POA: Diagnosis not present

## 2017-08-15 DIAGNOSIS — R109 Unspecified abdominal pain: Secondary | ICD-10-CM | POA: Diagnosis present

## 2017-08-15 DIAGNOSIS — N23 Unspecified renal colic: Secondary | ICD-10-CM | POA: Insufficient documentation

## 2017-08-15 DIAGNOSIS — I4891 Unspecified atrial fibrillation: Secondary | ICD-10-CM | POA: Diagnosis not present

## 2017-08-15 DIAGNOSIS — E1122 Type 2 diabetes mellitus with diabetic chronic kidney disease: Secondary | ICD-10-CM | POA: Insufficient documentation

## 2017-08-15 DIAGNOSIS — Z7984 Long term (current) use of oral hypoglycemic drugs: Secondary | ICD-10-CM | POA: Insufficient documentation

## 2017-08-15 LAB — CBC WITH DIFFERENTIAL/PLATELET
Basophils Absolute: 0 10*3/uL (ref 0.0–0.1)
Basophils Relative: 0 %
Eosinophils Absolute: 0.1 10*3/uL (ref 0.0–0.7)
Eosinophils Relative: 1 %
HEMATOCRIT: 41.1 % (ref 36.0–46.0)
Hemoglobin: 12.8 g/dL (ref 12.0–15.0)
LYMPHS ABS: 0.7 10*3/uL (ref 0.7–4.0)
Lymphocytes Relative: 9 %
MCH: 26.3 pg (ref 26.0–34.0)
MCHC: 31.1 g/dL (ref 30.0–36.0)
MCV: 84.4 fL (ref 78.0–100.0)
MONO ABS: 0.5 10*3/uL (ref 0.1–1.0)
Monocytes Relative: 6 %
Neutro Abs: 6.7 10*3/uL (ref 1.7–7.7)
Neutrophils Relative %: 84 %
Platelets: 92 10*3/uL — ABNORMAL LOW (ref 150–400)
RBC: 4.87 MIL/uL (ref 3.87–5.11)
RDW: 15.4 % (ref 11.5–15.5)
Smear Review: DECREASED
WBC: 8 10*3/uL (ref 4.0–10.5)

## 2017-08-15 LAB — COMPREHENSIVE METABOLIC PANEL
ALT: 17 U/L (ref 0–44)
AST: 20 U/L (ref 15–41)
Albumin: 4.4 g/dL (ref 3.5–5.0)
Alkaline Phosphatase: 62 U/L (ref 38–126)
Anion gap: 9 (ref 5–15)
BILIRUBIN TOTAL: 0.5 mg/dL (ref 0.3–1.2)
BUN: 29 mg/dL — AB (ref 8–23)
CO2: 24 mmol/L (ref 22–32)
Calcium: 9.7 mg/dL (ref 8.9–10.3)
Chloride: 107 mmol/L (ref 98–111)
Creatinine, Ser: 0.94 mg/dL (ref 0.44–1.00)
GFR calc Af Amer: >60 mL/min (ref >60)
GFR, EST NON AFRICAN AMERICAN: 53 mL/min — AB (ref >60)
Glucose, Bld: 183 mg/dL — ABNORMAL HIGH (ref 70–99)
POTASSIUM: 3.7 mmol/L (ref 3.5–5.1)
Sodium: 140 mmol/L (ref 135–145)
TOTAL PROTEIN: 7.7 g/dL (ref 6.5–8.1)

## 2017-08-15 LAB — URINALYSIS, ROUTINE W REFLEX MICROSCOPIC
BILIRUBIN URINE: NEGATIVE
GLUCOSE, UA: NEGATIVE mg/dL
KETONES UR: 15 mg/dL — AB
LEUKOCYTES UA: NEGATIVE
Nitrite: NEGATIVE
PROTEIN: 100 mg/dL — AB
Specific Gravity, Urine: 1.02 (ref 1.005–1.030)
pH: 6 (ref 5.0–8.0)

## 2017-08-15 LAB — PROTIME-INR
INR: 2.76
Prothrombin Time: 29 seconds — ABNORMAL HIGH (ref 11.4–15.2)

## 2017-08-15 LAB — URINALYSIS, MICROSCOPIC (REFLEX)

## 2017-08-15 LAB — LIPASE, BLOOD: LIPASE: 31 U/L (ref 11–51)

## 2017-08-15 MED ORDER — SODIUM CHLORIDE 0.9 % IV BOLUS
1000.0000 mL | Freq: Once | INTRAVENOUS | Status: AC
  Administered 2017-08-15: 1000 mL via INTRAVENOUS

## 2017-08-15 MED ORDER — ONDANSETRON HCL 4 MG/2ML IJ SOLN
4.0000 mg | Freq: Once | INTRAMUSCULAR | Status: AC
  Administered 2017-08-15: 4 mg via INTRAVENOUS
  Filled 2017-08-15: qty 2

## 2017-08-15 MED ORDER — HYDROCODONE-ACETAMINOPHEN 5-325 MG PO TABS: 1.0000 | ORAL_TABLET | Freq: Four times a day (QID) | ORAL | 0 refills | Status: DC | PRN

## 2017-08-15 MED ORDER — MORPHINE SULFATE (PF) 4 MG/ML IV SOLN
4.0000 mg | Freq: Once | INTRAVENOUS | Status: AC
  Administered 2017-08-15: 4 mg via INTRAVENOUS
  Filled 2017-08-15: qty 1

## 2017-08-15 MED ORDER — TAMSULOSIN HCL 0.4 MG PO CAPS: 0.4000 mg | ORAL_CAPSULE | Freq: Every day | ORAL | 0 refills | Status: DC

## 2017-08-15 MED FILL — TAMSULOSIN HCL 0.4 MG CAP: 0.4 | 10 days supply | Qty: 10 | Fill #0

## 2017-08-15 MED FILL — HYDROCODON-APAP 5-325: 5-325 | 2 days supply | Qty: 8 | Fill #0

## 2017-08-15 NOTE — Discharge Instructions (Addendum)
Take tylenol for pain   Take vicodin for severe pain. Do NOT drive with it.   Take flomax daily   See urology for follow up for your kidney stone   Return to ER if you have worse abdominal pain, flank pain, vomiting, fever

## 2017-08-15 NOTE — ED Triage Notes (Signed)
Pt reports awakening this am to notice pain in her right flank area, none present yesterday. When she used the restroom she noticed a small amt of blood in her urine. She has had kidney stones in the past with the same sx.

## 2017-08-15 NOTE — ED Provider Notes (Signed)
[facility] EMERGENCY DEPARTMENT Provider Note   CSN: [id number] Arrival date & time: 08/15/17  0750     History   Chief Complaint Chief Complaint  Patient presents with  . Flank Pain    HPI Chelsea Dean is a 84 y.o. female hx of AAA s/p endograf, DM, Afib on coumadin presenting with right flank pain.  Patient has acute onset of right flank pain started around 6 AM this morning that is sharp and radiates to the right groin.  States that it woke her up from sleep.  It is associated with some nausea as well as hematuria but no dysuria.  Patient states that she had a previous history of kidney stone and this is similar to her previous kidney stones.  Denies any fevers or chills.  The history is provided by the patient.    Past Medical History:  Diagnosis Date  . A-fib ([place]) 10/19/2016  . Abdominal aortic aneurysm (AAA) without rupture ([place]) 09/15/2013  . Acute idiopathic gout of right foot   . AKI (acute kidney injury) ([place]) 10/18/2016  . Anticoagulated on Coumadin   . Arthritis   . Diabetes mellitus without complication ([place])   . Diverticular disease of large intestine 04/16/2013  . Diverticulosis   . DM2 (diabetes mellitus, type 2) ([place]) 10/19/2016  . Dysrhythmia    Atrial fib  . History of colon polyps 04/16/2013  . Hx of colonic polyps   . Hyperlipidemia   . Hypertension   . Long term current use of anticoagulant therapy 02/05/2015  . Lumbar radiculopathy 03/28/2014  . Microalbuminuria 04/16/2013  . Osteoarthritis 04/16/2013  . Osteoarthritis of cervical spine 03/28/2014  . Pelvic mass 10/19/2016  . Right renal mass   . SBO (small bowel obstruction) ([place]) 10/18/2016  . Thrombocytopenia ([place]) 04/16/2013   Overview:  referred to hematology 01/2104    Patient Active Problem List   Diagnosis Date Noted  . Stage 3 chronic kidney disease ([place]) 07/25/2017  . Hypomagnesemia 03/31/2017  . 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/31/2017  . Abnormal urinalysis 03/16/2017  . Hypokalemia 10/31/2016  .  Lactic acidosis 10/31/2016  . Atrial fibrillation with RVR ([place]) 10/31/2016  . Elevated liver enzymes 10/31/2016  . Anticoagulated on Coumadin 10/31/2016  . Elevated INR 10/31/2016  . Hyperlipidemia 10/31/2016  . Hypertension 10/31/2016  . Acute idiopathic gout of right foot   . A-fib ([place]) 10/19/2016  . DM2 (diabetes mellitus, type 2) ([place]) 10/19/2016  . Pelvic mass 10/19/2016  . SBO (small bowel obstruction) ([place]) 10/18/2016  . AKI (acute kidney injury) ([place]) 10/18/2016  . Long term current use of anticoagulant therapy 02/05/2015  . Right renal mass   . Lumbar radiculopathy 03/28/2014  . Osteoarthritis of cervical spine 03/28/2014  . Abdominal aortic aneurysm (AAA) without rupture ([place]) 09/15/2013  . Diverticular disease of large intestine 04/16/2013  . History of colon polyps 04/16/2013  . Microalbuminuria 04/16/2013  . Osteoarthritis 04/16/2013  . Thrombocytopenia ([place]) 04/16/2013    Past Surgical History:  Procedure Laterality Date  . ABDOMINAL AORTIC ANEURYSM REPAIR    . ABDOMINAL HYSTERECTOMY    . IR GENERIC HISTORICAL  07/01/2015   IR RADIOLOGIST EVAL & MGMT 07/01/2015 [facility] INTERV RAD     OB History   None      Home Medications    Prior to Admission medications   Medication Sig Start Date End Date Taking? Authorizing Provider  amLODipine (NORVASC) 10 MG tablet Take 1 tablet (10 mg total) by mouth daily. 03/02/17   Abelino Derrick  Delrae Alfred, MD  diclofenac sodium (VOLTAREN) 1 % GEL Apply 2 g topically daily as needed (KNEE PAIN).    [provider]  glipiZIDE (GLUCOTROL XL) 10 MG 24 hr tablet TAKE 1 TABLET(10 MG) BY MOUTH DAILY WITH BREAKFAST 08/15/17   [name], MD  losartan (COZAAR) 100 MG tablet Take 1 tablet (100 mg total) by mouth daily. 03/02/17   [name], MD  metoprolol tartrate (LOPRESSOR) 100 MG tablet TAKE 1 TABLET(100 MG) BY MOUTH TWICE DAILY 08/15/17   [name], MD  polyethylene glycol [facility] / Floria Raveling)  packet Take 17 g by mouth daily. 10/28/16   Dessa Phi, DO  warfarin (COUMADIN) 2.5 MG tablet Take 2.5 mg by mouth daily. 2.5 mg daily except for Tuesday Thursday Saturday patient takes one half tablet (1.25 mg)    [provider]    Family History Family History  Problem Relation Age of Onset  . Cancer Sister        Rectal and Stomach    Social History Social History   Tobacco Use  . Smoking status: Never Smoker  . Smokeless tobacco: Current User    Types: Snuff  Substance Use Topics  . Alcohol use: No    Alcohol/week: 0.0 oz  . Drug use: No     Allergies   Ace inhibitors; Contrast media [iodinated diagnostic agents]; and Nsaids   Review of Systems Review of Systems  Genitourinary: Positive for flank pain.  All other systems reviewed and are negative.    Physical Exam Updated Vital Signs BP (!) 150/98 (BP Location: Right Arm)   Pulse 74   Temp 98.7 F (37.1 C) (Oral)   Resp 18   Ht 5\' 2"  (1.575 m)   Wt 72.6 kg (160 lb)   SpO2 98%   BMI 29.26 kg/m   Physical Exam  Constitutional: She is oriented to person, place, and time. She appears well-developed.  HENT:  Head: Normocephalic.  Mouth/Throat: Oropharynx is clear and moist.  Eyes: Pupils are equal, round, and reactive to light. Conjunctivae and EOM are normal.  Neck: Normal range of motion. Neck supple.  Cardiovascular: Normal rate, regular rhythm and normal heart sounds.  Pulmonary/Chest: Effort normal and breath sounds normal. No stridor. No respiratory distress. She has no wheezes.  Abdominal: Soft. Bowel sounds are normal.  + R CVAT, mild RLQ tenderness as well   Musculoskeletal: Normal range of motion.  Neurological: She is alert and oriented to person, place, and time. No cranial nerve deficit. Coordination normal.  Skin: Skin is warm. Capillary refill takes less than 2 seconds.  Psychiatric: She has a normal mood and affect. Her behavior is normal.  Nursing note and vitals  reviewed.    ED Treatments / Results  Labs (all labs ordered are listed, but only abnormal results are displayed) Labs Reviewed  CBC WITH DIFFERENTIAL/PLATELET - Abnormal; Notable for the following components:      Result Value   Platelets 92 (*)    All other components within normal limits  COMPREHENSIVE METABOLIC PANEL - Abnormal; Notable for the following components:   Glucose, Bld 183 (*)    BUN 29 (*)    GFR calc non Af Amer 53 (*)    All other components within normal limits  URINALYSIS, ROUTINE W REFLEX MICROSCOPIC - Abnormal; Notable for the following components:   Color, Urine STRAW (*)    Hgb urine dipstick LARGE (*)    Ketones, ur 15 (*)    Protein, ur  100 (*)    All other components within normal limits  PROTIME-INR - Abnormal; Notable for the following components:   Prothrombin Time 29.0 (*)    All other components within normal limits  URINALYSIS, MICROSCOPIC (REFLEX) - Abnormal; Notable for the following components:   Bacteria, UA RARE (*)    All other components within normal limits  LIPASE, BLOOD    EKG None  Radiology Ct Renal Stone Study  Result Date: 08/15/2017 CLINICAL DATA:  Right-sided flank pain for several hours with hematuria, initial encounter EXAM: CT ABDOMEN AND PELVIS WITHOUT CONTRAST TECHNIQUE: Multidetector CT imaging of the abdomen and pelvis was performed following the standard protocol without IV contrast. COMPARISON:  06/21/2017 FINDINGS: Lower chest: No acute abnormality.  Heart is enlarged in size. Hepatobiliary: No focal liver abnormality is seen. No gallstones, gallbladder wall thickening, or biliary dilatation. Pancreas: Unremarkable. No pancreatic ductal dilatation or surrounding inflammatory changes. Spleen: Normal in size without focal abnormality. Adrenals/Urinary Tract: Adrenal glands are within normal limits. Left kidney demonstrates some cystic change. No renal calculi or obstructive changes are noted on the left. Right kidney  demonstrates significant hydronephrosis and hydroureter extending to the right UVJ. A tiny 2 mm stone is noted best seen on image number 75 of series 5. No other ureteral stones are identified. Scattered right renal stones are noted similar to that seen on the prior exam. Considerable perinephric stranding is noted on the right extending along the left ureter consistent with the obstructive change. Stomach/Bowel: Scattered diverticular change of the colon is noted without evidence of diverticulitis. The appendix is within normal limits. No obstructive or inflammatory changes of the small bowel are noted. Vascular/Lymphatic: There are changes consistent with prior stent graft repair. The aneurysm sac is again prominent measuring approximately 7.9 cm in greatest dimension. This is relatively stable from the previous exam. No stranding is noted adjacent to the aortic sac. Some mottled density is noted within which may be related to varying degrees of resolving thrombus. Prior embolization of lumbar feeding branches noted posteriorly. No significant lymphadenopathy is noted. Reproductive: Status post hysterectomy. There is a stable cystic right ovarian lesion identified measuring 9.7 cm in greatest dimension. This has been present on multiple previous exams. No new adnexal abnormality is seen. Other: No abdominal wall hernia or abnormality. No abdominopelvic ascites. Musculoskeletal: Degenerative changes of the lumbar spine are seen. No acute bony abnormality is noted. IMPRESSION: New right-sided hydronephrosis and hydroureter secondary to a 2 mm right UVJ stone as described. Some smaller residual right renal stones are noted. Resolution of previously seen left-sided hydronephrosis. No definitive left renal stones are seen. Stent graft therapy of an abdominal aortic aneurysm. The aneurysm sac remains prominent which may be related to a degree of endo tension. Again, nonemergent evaluation with CTA is recommended to rule  out persistent endoleak. Stable right ovarian cystic lesion. Chronic changes as described above. Electronically Signed   By: Inez Catalina M.D.   On: 08/15/2017 08:50    Procedures Procedures (including critical care time)  Medications Ordered in ED Medications  sodium chloride 0.9 % bolus 1,000 mL (0 mLs Intravenous Stopped 08/15/17 1010)  morphine 4 MG/ML injection 4 mg (4 mg Intravenous Given 08/15/17 0851)  ondansetron (ZOFRAN) injection 4 mg (4 mg Intravenous Given 08/15/17 0843)     Initial Impression / Assessment and Plan / ED Course  I have reviewed the triage vital signs and the nursing notes.  Pertinent labs & imaging results that were available during my  care of the patient were reviewed by me and considered in my medical decision making (see chart for details).     Chelsea Dean is a 84 y.o. female here with R flank pain, hematuria. Hx of kidney stones and AAA with endograf. Likely renal colic, low suspicion for endoleak. She has IV contrast allergy. Will get labs, CT renal stone, UA. Will give pain meds and reassess.   10:13 AM UA + hematuria but no UTI. INR 2.7. CT showed 2 mm R UVJ stone with mild hydro. Aortic aneurysm stable, I doubt endoleak (hg stable, no melena). Pain controlled currently. Will dc home with vicodin, flomax, urology follow up     Final Clinical Impressions(s) / ED Diagnoses   Final diagnoses:  None    ED Discharge Orders    None       Drenda Freeze, MD 08/15/17 1014

## 2017-08-17 ENCOUNTER — Ambulatory Visit (INDEPENDENT_AMBULATORY_CARE_PROVIDER_SITE_OTHER): Payer: Medicare Other | Admitting: Family Medicine

## 2017-08-17 ENCOUNTER — Encounter: Payer: Self-pay | Admitting: Family Medicine

## 2017-08-17 VITALS — BP 126/80 | HR 83 | Ht 62.0 in | Wt 164.0 lb

## 2017-08-17 DIAGNOSIS — I10 Essential (primary) hypertension: Secondary | ICD-10-CM | POA: Diagnosis not present

## 2017-08-17 DIAGNOSIS — I482 Chronic atrial fibrillation, unspecified: Secondary | ICD-10-CM

## 2017-08-17 DIAGNOSIS — E119 Type 2 diabetes mellitus without complications: Secondary | ICD-10-CM

## 2017-08-17 MED ORDER — METOPROLOL TARTRATE 100 MG PO TABS: ORAL_TABLET | ORAL | 1 refills | Status: DC

## 2017-08-17 MED ORDER — GLIPIZIDE ER 10 MG PO TB24: ORAL_TABLET | ORAL | 1 refills | Status: DC

## 2017-08-17 MED ORDER — LOSARTAN POTASSIUM 100 MG PO TABS: 100.0000 mg | ORAL_TABLET | Freq: Every day | ORAL | 1 refills | Status: DC

## 2017-08-17 MED ORDER — AMLODIPINE BESYLATE 10 MG PO TABS: 10.0000 mg | ORAL_TABLET | Freq: Every day | ORAL | 1 refills | Status: DC

## 2017-08-17 NOTE — Progress Notes (Signed)
Subjective:  Patient ID: Chelsea Dean, female    DOB: [birth year]  Age: 84 y.o. MRN: [id number]  CC: Follow-up   HPI Chelsea Dean presents for follow-up of recent ER visit for right renal colic.  Apparently has passed a 2 mm stone as seen on CT scan obtained in the ER 2 days ago.  She is no longer having any abdominal or flank pain.  There is no hematuria.  She is having no problems with urination or passing bowel movements.  Blood pressure has been well controlled on her current regimen.  She does have some lower extremity swelling by the end of the day that right solves overnight.  She denies palpitations chest pain or shortness of breath.  She denies issues with her glipizide and is certain to take it with a meal daily.  Hemoglobin A1c 3 weeks ago was 6.2  Outpatient Medications Prior to Visit  Medication Sig Dispense Refill  . diclofenac sodium (VOLTAREN) 1 % GEL Apply 2 g topically daily as needed (KNEE PAIN).    Marland Kitchen polyethylene glycol (MIRALAX / GLYCOLAX) packet Take 17 g by mouth daily. 14 each 0  . tamsulosin (FLOMAX) 0.4 MG CAPS capsule Take 1 capsule (0.4 mg total) by mouth daily. 10 capsule 0  . warfarin (COUMADIN) 2.5 MG tablet Take 2.5 mg by mouth daily. 2.5 mg daily except for Tuesday Thursday Saturday patient takes one half tablet (1.25 mg)    . amLODipine (NORVASC) 10 MG tablet Take 1 tablet (10 mg total) by mouth daily. 90 tablet 1  . glipiZIDE (GLUCOTROL XL) 10 MG 24 hr tablet TAKE 1 TABLET(10 MG) BY MOUTH DAILY WITH BREAKFAST 90 tablet 0  . HYDROcodone-acetaminophen (NORCO/VICODIN) 5-325 MG tablet Take 1 tablet by mouth every 6 (six) hours as needed. 8 tablet 0  . losartan (COZAAR) 100 MG tablet Take 1 tablet (100 mg total) by mouth daily. 90 tablet 1  . metoprolol tartrate (LOPRESSOR) 100 MG tablet TAKE 1 TABLET(100 MG) BY MOUTH TWICE DAILY 180 tablet 0   No facility-administered medications prior to visit.     ROS Review of Systems  Constitutional: Negative.     HENT: Negative.   Eyes: Negative.   Respiratory: Negative.   Cardiovascular: Negative.   Gastrointestinal: Negative.   Endocrine: Negative for polyphagia and polyuria.  Genitourinary: Negative for frequency and hematuria.  Musculoskeletal: Negative.   Allergic/Immunologic: Negative for immunocompromised state.  Neurological: Negative for weakness and light-headedness.  Hematological: Does not bruise/bleed easily.  Psychiatric/Behavioral: Negative.     Objective:  BP 126/80   Pulse 83   Ht 5\' 2"  (1.575 m)   Wt 164 lb (74.4 kg)   SpO2 96%   BMI 30.00 kg/m   BP Readings from Last 3 Encounters:  08/17/17 126/80  08/15/17 (!) 154/94  07/25/17 128/80    Wt Readings from Last 3 Encounters:  08/17/17 164 lb (74.4 kg)  08/15/17 160 lb (72.6 kg)  07/25/17 160 lb (72.6 kg)    Physical Exam  Constitutional: She is oriented to person, place, and time. She appears well-developed and well-nourished. No distress.  HENT:  Head: Normocephalic and atraumatic.  Right Ear: External ear normal.  Left Ear: External ear normal.  Mouth/Throat: Oropharynx is clear and moist. No oropharyngeal exudate.  Eyes: Pupils are equal, round, and reactive to light. Conjunctivae and EOM are normal. Right eye exhibits no discharge. Left eye exhibits no discharge. No scleral icterus.  Neck: Neck supple. No JVD present. No tracheal deviation present. No  thyromegaly present.  Cardiovascular: Normal rate, regular rhythm and normal heart sounds.  Pulmonary/Chest: Effort normal and breath sounds normal.  Abdominal: Soft. Bowel sounds are normal.  Lymphadenopathy:    She has no cervical adenopathy.  Neurological: She is alert and oriented to person, place, and time.  Skin: Skin is warm and dry. She is not diaphoretic.  Psychiatric: She has a normal mood and affect. Her behavior is normal.    Lab Results  Component Value Date   WBC 8.0 08/15/2017   HGB 12.8 08/15/2017   HCT 41.1 08/15/2017   PLT 92 (L)  08/15/2017   GLUCOSE 183 (H) 08/15/2017   ALT 17 08/15/2017   AST 20 08/15/2017   NA 140 08/15/2017   K 3.7 08/15/2017   CL 107 08/15/2017   CREATININE 0.94 08/15/2017   BUN 29 (H) 08/15/2017   CO2 24 08/15/2017   TSH 3.26 03/02/2017   INR 2.76 08/15/2017   HGBA1C 6.2 07/25/2017   MICROALBUR 35.7 (H) 03/02/2017    Ct Renal Stone Study  Result Date: 08/15/2017 CLINICAL DATA:  Right-sided flank pain for several hours with hematuria, initial encounter EXAM: CT ABDOMEN AND PELVIS WITHOUT CONTRAST TECHNIQUE: Multidetector CT imaging of the abdomen and pelvis was performed following the standard protocol without IV contrast. COMPARISON:  06/21/2017 FINDINGS: Lower chest: No acute abnormality.  Heart is enlarged in size. Hepatobiliary: No focal liver abnormality is seen. No gallstones, gallbladder wall thickening, or biliary dilatation. Pancreas: Unremarkable. No pancreatic ductal dilatation or surrounding inflammatory changes. Spleen: Normal in size without focal abnormality. Adrenals/Urinary Tract: Adrenal glands are within normal limits. Left kidney demonstrates some cystic change. No renal calculi or obstructive changes are noted on the left. Right kidney demonstrates significant hydronephrosis and hydroureter extending to the right UVJ. A tiny 2 mm stone is noted best seen on image number 75 of series 5. No other ureteral stones are identified. Scattered right renal stones are noted similar to that seen on the prior exam. Considerable perinephric stranding is noted on the right extending along the left ureter consistent with the obstructive change. Stomach/Bowel: Scattered diverticular change of the colon is noted without evidence of diverticulitis. The appendix is within normal limits. No obstructive or inflammatory changes of the small bowel are noted. Vascular/Lymphatic: There are changes consistent with prior stent graft repair. The aneurysm sac is again prominent measuring approximately 7.9 cm in  greatest dimension. This is relatively stable from the previous exam. No stranding is noted adjacent to the aortic sac. Some mottled density is noted within which may be related to varying degrees of resolving thrombus. Prior embolization of lumbar feeding branches noted posteriorly. No significant lymphadenopathy is noted. Reproductive: Status post hysterectomy. There is a stable cystic right ovarian lesion identified measuring 9.7 cm in greatest dimension. This has been present on multiple previous exams. No new adnexal abnormality is seen. Other: No abdominal wall hernia or abnormality. No abdominopelvic ascites. Musculoskeletal: Degenerative changes of the lumbar spine are seen. No acute bony abnormality is noted. IMPRESSION: New right-sided hydronephrosis and hydroureter secondary to a 2 mm right UVJ stone as described. Some smaller residual right renal stones are noted. Resolution of previously seen left-sided hydronephrosis. No definitive left renal stones are seen. Stent graft therapy of an abdominal aortic aneurysm. The aneurysm sac remains prominent which may be related to a degree of endo tension. Again, nonemergent evaluation with CTA is recommended to rule out persistent endoleak. Stable right ovarian cystic lesion. Chronic changes as described above. Electronically  Signed   By: Inez Catalina M.D.   On: 08/15/2017 08:50    Assessment & Plan:   Chelsea Dean was seen today for follow-up.  Diagnoses and all orders for this visit:  Chronic atrial fibrillation (HCC) -     metoprolol tartrate (LOPRESSOR) 100 MG tablet; TAKE 1 TABLET(100 MG) BY MOUTH TWICE DAILY  Essential hypertension -     amLODipine (NORVASC) 10 MG tablet; Take 1 tablet (10 mg total) by mouth daily. -     losartan (COZAAR) 100 MG tablet; Take 1 tablet (100 mg total) by mouth daily. -     metoprolol tartrate (LOPRESSOR) 100 MG tablet; TAKE 1 TABLET(100 MG) BY MOUTH TWICE DAILY  Type 2 diabetes mellitus without complication,  without long-term current use of insulin (HCC) -     glipiZIDE (GLUCOTROL XL) 10 MG 24 hr tablet; TAKE 1 TABLET(10 MG) BY MOUTH DAILY WITH BREAKFAST -     losartan (COZAAR) 100 MG tablet; Take 1 tablet (100 mg total) by mouth daily.   I have discontinued Chelsea Dean's HYDROcodone-acetaminophen. I am also having her maintain her diclofenac sodium, polyethylene glycol, warfarin, tamsulosin, amLODipine, glipiZIDE, losartan, and metoprolol tartrate.  Meds ordered this encounter  Medications  . amLODipine (NORVASC) 10 MG tablet    Sig: Take 1 tablet (10 mg total) by mouth daily.    Dispense:  90 tablet    Refill:  1  . glipiZIDE (GLUCOTROL XL) 10 MG 24 hr tablet    Sig: TAKE 1 TABLET(10 MG) BY MOUTH DAILY WITH BREAKFAST    Dispense:  90 tablet    Refill:  1  . losartan (COZAAR) 100 MG tablet    Sig: Take 1 tablet (100 mg total) by mouth daily.    Dispense:  90 tablet    Refill:  1  . metoprolol tartrate (LOPRESSOR) 100 MG tablet    Sig: TAKE 1 TABLET(100 MG) BY MOUTH TWICE DAILY    Dispense:  180 tablet    Refill:  1   Continue current medical regimen.  Patient counseled to drink plenty of water.  Follow-up in 3 months or sooner as needed.  Follow-up: No follow-ups on file.  Chelsea Maw, MD

## 2017-08-23 ENCOUNTER — Other Ambulatory Visit: Payer: Self-pay | Admitting: *Deleted

## 2017-08-23 DIAGNOSIS — Z8679 Personal history of other diseases of the circulatory system: Secondary | ICD-10-CM

## 2017-08-24 ENCOUNTER — Other Ambulatory Visit: Payer: Self-pay | Admitting: Radiology

## 2017-08-24 MED ORDER — DIPHENHYDRAMINE HCL 50 MG PO TABS: 50.0000 mg | ORAL_TABLET | ORAL | 0 refills | Status: DC

## 2017-08-24 MED ORDER — PREDNISONE 50 MG PO TABS: ORAL_TABLET | ORAL | 0 refills | Status: AC

## 2017-08-26 ENCOUNTER — Ambulatory Visit: Payer: Medicare Other | Admitting: Family Medicine

## 2017-09-07 ENCOUNTER — Telehealth: Payer: Self-pay | Admitting: Interventional Radiology

## 2017-09-07 NOTE — Telephone Encounter (Signed)
Chelsea Dean was initially scheduled to see me in follow-up today, however she is having a relatively difficult time following the passing of Chelsea husband.  I called and talked with Chelsea daughter, Chelsea Dean, today to discuss the results of Chelsea recent CT imaging.  Chelsea Dean had a CT arteriogram of the abdomen and pelvis performed 09/05/2017.  The CT scan shows a stable percutaneous cryoablation defect in the posterior superior aspect of the right kidney.  No evidence of residual or recurrent disease.  There is a persistent type II endoleak, however the infrarenal abdominal aortic aneurysm sac demonstrates no significant interval growth over the past year dating back to January.  However, compared to more remote prior studies from December 2017 there has been definitive growth over time.    Chelsea Dean commented that Chelsea Dean may not be interested in undergoing any additional procedures.  Given the relative stability since January, I think that is very reasonable.  At this point, we can either continue surveillance, or if she knows she would not consider undergoing any further interventions, we could cease follow-up altogether.  Chelsea Dean and call us here at the office with their decision.  If they would like to proceed with continued clinical surveillance, we will perform a repeat CT arteriogram of the abdomen and pelvis in 1 year.

## 2017-10-19 ENCOUNTER — Ambulatory Visit (INDEPENDENT_AMBULATORY_CARE_PROVIDER_SITE_OTHER): Payer: Medicare Other | Admitting: Family Medicine

## 2017-10-19 ENCOUNTER — Encounter: Payer: Self-pay | Admitting: Family Medicine

## 2017-10-19 VITALS — BP 110/80 | HR 79 | Ht 62.0 in | Wt 161.5 lb

## 2017-10-19 DIAGNOSIS — G8929 Other chronic pain: Secondary | ICD-10-CM | POA: Diagnosis not present

## 2017-10-19 DIAGNOSIS — M25561 Pain in right knee: Secondary | ICD-10-CM

## 2017-10-19 DIAGNOSIS — M25562 Pain in left knee: Secondary | ICD-10-CM

## 2017-10-19 NOTE — Progress Notes (Addendum)
Subjective:  Patient ID: Chelsea Dean, female    DOB: [birth year]  Age: 84 y.o. MRN: [id number]  CC: Knee Pain (right knee, wants a knee injection)   HPI Chelsea Dean presents for evaluation of chronic right knee pain.  There is been no recent injury.  He does not it does not lock up or give way.  There is been no swelling erythema or fever.  She is tried over-the-counter Tylenol and Aspercreme rubs without much relief.  She requests an injection.  Diabetes is been under good control.  Outpatient Medications Prior to Visit  Medication Sig Dispense Refill  . amLODipine (NORVASC) 10 MG tablet Take 1 tablet (10 mg total) by mouth daily. 90 tablet 1  . diclofenac sodium (VOLTAREN) 1 % GEL Apply 2 g topically daily as needed (KNEE PAIN).    Marland Kitchen diphenhydrAMINE (BENADRYL) 50 MG tablet Take 1 tablet (50 mg total) by mouth as directed for 1 dose. Take Benadryl 50 mg po 1 hr prior to CT (app't 09/05/2017) 1 tablet 0  . glipiZIDE (GLUCOTROL XL) 10 MG 24 hr tablet TAKE 1 TABLET(10 MG) BY MOUTH DAILY WITH BREAKFAST 90 tablet 1  . losartan (COZAAR) 100 MG tablet Take 1 tablet (100 mg total) by mouth daily. 90 tablet 1  . metoprolol tartrate (LOPRESSOR) 100 MG tablet TAKE 1 TABLET(100 MG) BY MOUTH TWICE DAILY 180 tablet 1  . polyethylene glycol (MIRALAX / GLYCOLAX) packet Take 17 g by mouth daily. 14 each 0  . tamsulosin (FLOMAX) 0.4 MG CAPS capsule Take 1 capsule (0.4 mg total) by mouth daily. 10 capsule 0  . warfarin (COUMADIN) 2.5 MG tablet Take 2.5 mg by mouth daily. 2.5 mg daily except for Tuesday Thursday Saturday patient takes one half tablet (1.25 mg)     No facility-administered medications prior to visit.     ROS Review of Systems  Constitutional: Negative for chills, fever and unexpected weight change.  Musculoskeletal: Positive for arthralgias and gait problem.  Skin: Negative for color change and pallor.  Allergic/Immunologic: Negative for immunocompromised state.  Hematological: Does  not bruise/bleed easily.  Psychiatric/Behavioral: Negative.     Objective:  BP 110/80   Pulse 79   Ht 5\' 2"  (1.575 m)   Wt 161 lb 8 oz (73.3 kg)   SpO2 95%   BMI 29.54 kg/m   BP Readings from Last 3 Encounters:  10/19/17 110/80  08/17/17 126/80  08/15/17 (!) 154/94    Wt Readings from Last 3 Encounters:  10/19/17 161 lb 8 oz (73.3 kg)  08/17/17 164 lb (74.4 kg)  08/15/17 160 lb (72.6 kg)    Physical Exam  Constitutional: She appears well-developed and well-nourished. No distress.  HENT:  Head: Normocephalic and atraumatic.  Right Ear: External ear normal.  Left Ear: External ear normal.  Pulmonary/Chest: Effort normal.  Musculoskeletal:       Right knee: She exhibits decreased range of motion and swelling. She exhibits no effusion, no ecchymosis and no erythema. Tenderness found. Lateral joint line tenderness noted.  Skin: Skin is warm and dry. She is not diaphoretic.  Psychiatric: She has a normal mood and affect. Her behavior is normal.    Lab Results  Component Value Date   WBC 8.0 08/15/2017   HGB 12.8 08/15/2017   HCT 41.1 08/15/2017   PLT 92 (L) 08/15/2017   GLUCOSE 183 (H) 08/15/2017   ALT 17 08/15/2017   AST 20 08/15/2017   NA 140 08/15/2017   K 3.7 08/15/2017  CL 107 08/15/2017   CREATININE 0.94 08/15/2017   BUN 29 (H) 08/15/2017   CO2 24 08/15/2017   TSH 3.26 03/02/2017   INR 2.76 08/15/2017   HGBA1C 6.2 07/25/2017   MICROALBUR 35.7 (H) 03/02/2017    Ct Renal Stone Study  Result Date: 08/15/2017 CLINICAL DATA:  Right-sided flank pain for several hours with hematuria, initial encounter EXAM: CT ABDOMEN AND PELVIS WITHOUT CONTRAST TECHNIQUE: Multidetector CT imaging of the abdomen and pelvis was performed following the standard protocol without IV contrast. COMPARISON:  06/21/2017 FINDINGS: Lower chest: No acute abnormality.  Heart is enlarged in size. Hepatobiliary: No focal liver abnormality is seen. No gallstones, gallbladder wall thickening, or  biliary dilatation. Pancreas: Unremarkable. No pancreatic ductal dilatation or surrounding inflammatory changes. Spleen: Normal in size without focal abnormality. Adrenals/Urinary Tract: Adrenal glands are within normal limits. Left kidney demonstrates some cystic change. No renal calculi or obstructive changes are noted on the left. Right kidney demonstrates significant hydronephrosis and hydroureter extending to the right UVJ. A tiny 2 mm stone is noted best seen on image number 75 of series 5. No other ureteral stones are identified. Scattered right renal stones are noted similar to that seen on the prior exam. Considerable perinephric stranding is noted on the right extending along the left ureter consistent with the obstructive change. Stomach/Bowel: Scattered diverticular change of the colon is noted without evidence of diverticulitis. The appendix is within normal limits. No obstructive or inflammatory changes of the small bowel are noted. Vascular/Lymphatic: There are changes consistent with prior stent graft repair. The aneurysm sac is again prominent measuring approximately 7.9 cm in greatest dimension. This is relatively stable from the previous exam. No stranding is noted adjacent to the aortic sac. Some mottled density is noted within which may be related to varying degrees of resolving thrombus. Prior embolization of lumbar feeding branches noted posteriorly. No significant lymphadenopathy is noted. Reproductive: Status post hysterectomy. There is a stable cystic right ovarian lesion identified measuring 9.7 cm in greatest dimension. This has been present on multiple previous exams. No new adnexal abnormality is seen. Other: No abdominal wall hernia or abnormality. No abdominopelvic ascites. Musculoskeletal: Degenerative changes of the lumbar spine are seen. No acute bony abnormality is noted. IMPRESSION: New right-sided hydronephrosis and hydroureter secondary to a 2 mm right UVJ stone as described.  Some smaller residual right renal stones are noted. Resolution of previously seen left-sided hydronephrosis. No definitive left renal stones are seen. Stent graft therapy of an abdominal aortic aneurysm. The aneurysm sac remains prominent which may be related to a degree of endo tension. Again, Aspiration/Injection Procedure Note Kieran Nachtigal [id number] [birth year]  Procedure: Injection Indications: chronic pain  Procedure Details Consent: Risks of procedure as well as the alternatives and risks of each were explained to the (patient/caregiver).  Consent for procedure obtained. Time Out: Verified patient identification, verified procedure, site/side was marked, verified correct patient position, special equipment/implants available, medications/allergies/relevent history reviewed, required imaging and test results available.  Performed   Local Anesthesia Used:Lidocaine 1% plain; 61mL and Marcaine 0.25% plain; 50mL1mLKenalog 40 Amount of Fluid Aspirated: 0 Character of Fluid: none Fluid was sent for:NA A sterile dressing was applied.  Patient did tolerate procedure well. Estimated blood loss: 0  Superior medial pole of the subpatellar space this space was identified and marked.  It was cleansed with Betadine x3 and wiped clear with alcohol.  Injected with above.  [name] 10/26/2017, 10:26 AM nonemergent evaluation with CTA is recommended to  rule out persistent endoleak. Stable right ovarian cystic lesion. Chronic changes as described above. Electronically Signed   By: Inez Catalina M.D.   On: 08/15/2017 08:50    Assessment & Plan:   Annalicia was seen today for knee pain.  Diagnoses and all orders for this visit:  Chronic pain of right knee -     triamcinolone acetonide (KENALOG-40) injection 40 mg   I am having Chelsea Dean maintain her diclofenac sodium, polyethylene glycol, warfarin, tamsulosin, amLODipine, glipiZIDE, losartan, metoprolol tartrate, and  diphenhydrAMINE. We will continue to administer triamcinolone acetonide.  Meds ordered this encounter  Medications  . triamcinolone acetonide (KENALOG-40) injection 40 mg   Patient will expect some possible pain tomorrow and treat with Tylenol.  She will RTC on Friday or go to the emergency room with any prolonged pain swelling or erythema.  She is aware that her diabetes may be affected and to show extra caution with food choices particularly sweets.  Follow-up: Return in about 2 days (around 10/21/2017), or if symptoms worsen or fail to improve.  [name], MD

## 2017-10-25 ENCOUNTER — Ambulatory Visit: Payer: Medicare Other | Admitting: Family Medicine

## 2017-10-26 ENCOUNTER — Ambulatory Visit: Payer: Medicare Other | Admitting: Family Medicine

## 2017-10-26 MED ORDER — TRIAMCINOLONE ACETONIDE 40 MG/ML IJ SUSP: 40.0000 mg | Freq: Once | INTRAMUSCULAR | Status: DC

## 2017-10-26 NOTE — Addendum Note (Signed)
Addended by: Abelino Derrick A on: 10/26/2017 10:26 AM   Modules accepted: Orders

## 2017-11-03 ENCOUNTER — Encounter: Payer: Self-pay | Admitting: Family Medicine

## 2017-11-03 ENCOUNTER — Ambulatory Visit (INDEPENDENT_AMBULATORY_CARE_PROVIDER_SITE_OTHER): Payer: Medicare Other | Admitting: Family Medicine

## 2017-11-03 VITALS — BP 110/70 | HR 64 | Ht 62.0 in | Wt 162.0 lb

## 2017-11-03 DIAGNOSIS — I482 Chronic atrial fibrillation, unspecified: Secondary | ICD-10-CM

## 2017-11-03 DIAGNOSIS — I10 Essential (primary) hypertension: Secondary | ICD-10-CM

## 2017-11-03 DIAGNOSIS — E119 Type 2 diabetes mellitus without complications: Secondary | ICD-10-CM | POA: Diagnosis not present

## 2017-11-03 DIAGNOSIS — N183 Chronic kidney disease, stage 3 unspecified: Secondary | ICD-10-CM

## 2017-11-03 LAB — HEMOGLOBIN A1C: Hgb A1c MFr Bld: 6.3 % (ref 4.6–6.5)

## 2017-11-03 LAB — BASIC METABOLIC PANEL
BUN: 28 mg/dL — ABNORMAL HIGH (ref 6–23)
CALCIUM: 10 mg/dL (ref 8.4–10.5)
CO2: 29 mEq/L (ref 19–32)
Chloride: 106 mEq/L (ref 96–112)
Creatinine, Ser: 1.06 mg/dL (ref 0.40–1.20)
GFR: 51.94 mL/min — AB (ref >60.00)
GLUCOSE: 76 mg/dL (ref 70–99)
POTASSIUM: 4.2 meq/L (ref 3.5–5.1)
Sodium: 144 mEq/L (ref 135–145)

## 2017-11-03 MED ORDER — METOPROLOL TARTRATE 100 MG PO TABS: ORAL_TABLET | ORAL | 1 refills | Status: DC

## 2017-11-03 MED ORDER — AMLODIPINE BESYLATE 10 MG PO TABS: 10.0000 mg | ORAL_TABLET | Freq: Every day | ORAL | 1 refills | Status: DC

## 2017-11-03 MED ORDER — LOSARTAN POTASSIUM 100 MG PO TABS: 100.0000 mg | ORAL_TABLET | Freq: Every day | ORAL | 1 refills | Status: DC

## 2017-11-03 NOTE — Addendum Note (Signed)
Addended by: Lynnea Ferrier on: 11/03/2017 02:06 PM   Modules accepted: Orders

## 2017-11-03 NOTE — Progress Notes (Signed)
Subjective:  Patient ID: Chelsea Dean, female    DOB: 1928-01-19  Age: 84 y.o. MRN: [id number]  CC: Follow-up   HPI Chelsea Dean presents for follow-up of her hypertension diabetes and chronic kidney disease.  She has been taking her metoprolol amlodipine and losartan as directed.  Blood pressure has been controlled and her renal function is stable today.  Diabetes has been controlled with the glipizide.  She is certain to eat directly after taking her glipizide.  Intra-articular injection of her knee helped her a great deal.  She is able to walk more frequently and often but continues to use her cane.  Outpatient Medications Prior to Visit  Medication Sig Dispense Refill  . diclofenac sodium (VOLTAREN) 1 % GEL Apply 2 g topically daily as needed (KNEE PAIN).    Marland Kitchen diphenhydrAMINE (BENADRYL) 50 MG tablet Take 1 tablet (50 mg total) by mouth as directed for 1 dose. Take Benadryl 50 mg po 1 hr prior to CT (app't 09/05/2017) 1 tablet 0  . polyethylene glycol (MIRALAX / GLYCOLAX) packet Take 17 g by mouth daily. 14 each 0  . warfarin (COUMADIN) 2.5 MG tablet Take 2.5 mg by mouth daily. 2.5 mg daily except for Tuesday Thursday Saturday patient takes one half tablet (1.25 mg)    . amLODipine (NORVASC) 10 MG tablet Take 1 tablet (10 mg total) by mouth daily. 90 tablet 1  . glipiZIDE (GLUCOTROL XL) 10 MG 24 hr tablet TAKE 1 TABLET(10 MG) BY MOUTH DAILY WITH BREAKFAST 90 tablet 1  . losartan (COZAAR) 100 MG tablet Take 1 tablet (100 mg total) by mouth daily. 90 tablet 1  . metoprolol tartrate (LOPRESSOR) 100 MG tablet TAKE 1 TABLET(100 MG) BY MOUTH TWICE DAILY 180 tablet 1  . tamsulosin (FLOMAX) 0.4 MG CAPS capsule Take 1 capsule (0.4 mg total) by mouth daily. 10 capsule 0  . triamcinolone acetonide (KENALOG-40) injection 40 mg      No facility-administered medications prior to visit.     ROS Review of Systems  Constitutional: Negative for chills, fatigue, fever and unexpected weight change.    HENT: Negative.   Eyes: Negative.   Respiratory: Negative.   Cardiovascular: Negative.   Gastrointestinal: Negative.   Endocrine: Negative for polyphagia and polyuria.  Skin: Negative for pallor and rash.  Allergic/Immunologic: Negative for immunocompromised state.  Neurological: Negative for weakness and headaches.  Hematological: Does not bruise/bleed easily.  Psychiatric/Behavioral: Negative.     Objective:  BP 110/70   Pulse 64   Ht 5\' 2"  (1.575 m)   Wt 162 lb (73.5 kg)   SpO2 95%   BMI 29.63 kg/m   BP Readings from Last 3 Encounters:  11/03/17 110/70  10/19/17 110/80  08/17/17 126/80    Wt Readings from Last 3 Encounters:  11/03/17 162 lb (73.5 kg)  10/19/17 161 lb 8 oz (73.3 kg)  08/17/17 164 lb (74.4 kg)    Physical Exam  Constitutional: She is oriented to person, place, and time. She appears well-developed and well-nourished. No distress.  HENT:  Head: Normocephalic and atraumatic.  Right Ear: External ear normal.  Left Ear: External ear normal.  Mouth/Throat: Oropharynx is clear and moist. No oropharyngeal exudate.  Eyes: Right eye exhibits no discharge. Left eye exhibits no discharge. No scleral icterus.  Neck: No JVD present. No tracheal deviation present.  Cardiovascular: An irregularly irregular rhythm present.  Pulmonary/Chest: Effort normal and breath sounds normal.  Neurological: She is alert and oriented to person, place, and time.  Skin: Skin is warm and dry. She is not diaphoretic.  Psychiatric: She has a normal mood and affect. Her behavior is normal.    Lab Results  Component Value Date   WBC 8.0 08/15/2017   HGB 12.8 08/15/2017   HCT 41.1 08/15/2017   PLT 92 (L) 08/15/2017   GLUCOSE 183 (H) 08/15/2017   ALT 17 08/15/2017   AST 20 08/15/2017   NA 140 08/15/2017   K 3.7 08/15/2017   CL 107 08/15/2017   CREATININE 0.94 08/15/2017   BUN 29 (H) 08/15/2017   CO2 24 08/15/2017   TSH 3.26 03/02/2017   INR 2.76 08/15/2017   HGBA1C 6.2  07/25/2017   MICROALBUR 35.7 (H) 03/02/2017    Ct Renal Stone Study  Result Date: 08/15/2017 CLINICAL DATA:  Right-sided flank pain for several hours with hematuria, initial encounter EXAM: CT ABDOMEN AND PELVIS WITHOUT CONTRAST TECHNIQUE: Multidetector CT imaging of the abdomen and pelvis was performed following the standard protocol without IV contrast. COMPARISON:  06/21/2017 FINDINGS: Lower chest: No acute abnormality.  Heart is enlarged in size. Hepatobiliary: No focal liver abnormality is seen. No gallstones, gallbladder wall thickening, or biliary dilatation. Pancreas: Unremarkable. No pancreatic ductal dilatation or surrounding inflammatory changes. Spleen: Normal in size without focal abnormality. Adrenals/Urinary Tract: Adrenal glands are within normal limits. Left kidney demonstrates some cystic change. No renal calculi or obstructive changes are noted on the left. Right kidney demonstrates significant hydronephrosis and hydroureter extending to the right UVJ. A tiny 2 mm stone is noted best seen on image number 75 of series 5. No other ureteral stones are identified. Scattered right renal stones are noted similar to that seen on the prior exam. Considerable perinephric stranding is noted on the right extending along the left ureter consistent with the obstructive change. Stomach/Bowel: Scattered diverticular change of the colon is noted without evidence of diverticulitis. The appendix is within normal limits. No obstructive or inflammatory changes of the small bowel are noted. Vascular/Lymphatic: There are changes consistent with prior stent graft repair. The aneurysm sac is again prominent measuring approximately 7.9 cm in greatest dimension. This is relatively stable from the previous exam. No stranding is noted adjacent to the aortic sac. Some mottled density is noted within which may be related to varying degrees of resolving thrombus. Prior embolization of lumbar feeding branches noted  posteriorly. No significant lymphadenopathy is noted. Reproductive: Status post hysterectomy. There is a stable cystic right ovarian lesion identified measuring 9.7 cm in greatest dimension. This has been present on multiple previous exams. No new adnexal abnormality is seen. Other: No abdominal wall hernia or abnormality. No abdominopelvic ascites. Musculoskeletal: Degenerative changes of the lumbar spine are seen. No acute bony abnormality is noted. IMPRESSION: New right-sided hydronephrosis and hydroureter secondary to a 2 mm right UVJ stone as described. Some smaller residual right renal stones are noted. Resolution of previously seen left-sided hydronephrosis. No definitive left renal stones are seen. Stent graft therapy of an abdominal aortic aneurysm. The aneurysm sac remains prominent which may be related to a degree of endo tension. Again, nonemergent evaluation with CTA is recommended to rule out persistent endoleak. Stable right ovarian cystic lesion. Chronic changes as described above. Electronically Signed   By: Inez Catalina M.D.   On: 08/15/2017 08:50    Assessment & Plan:   Chelsea Dean was seen today for follow-up.  Diagnoses and all orders for this visit:  Essential hypertension -     amLODipine (NORVASC) 10 MG tablet; Take 1  tablet (10 mg total) by mouth daily. -     losartan (COZAAR) 100 MG tablet; Take 1 tablet (100 mg total) by mouth daily. -     metoprolol tartrate (LOPRESSOR) 100 MG tablet; TAKE 1 TABLET(100 MG) BY MOUTH TWICE DAILY -     Basic metabolic panel -     Microalbumin / creatinine urine ratio  Type 2 diabetes mellitus without complication, without long-term current use of insulin (HCC) -     losartan (COZAAR) 100 MG tablet; Take 1 tablet (100 mg total) by mouth daily. -     Basic metabolic panel -     Hemoglobin A1c -     Microalbumin / creatinine urine ratio -     Ambulatory referral to Ophthalmology  Stage 3 chronic kidney disease (HCC) -     losartan (COZAAR)  100 MG tablet; Take 1 tablet (100 mg total) by mouth daily. -     Basic metabolic panel  Chronic atrial fibrillation (HCC) -     metoprolol tartrate (LOPRESSOR) 100 MG tablet; TAKE 1 TABLET(100 MG) BY MOUTH TWICE DAILY   I have discontinued Thora Benedetti's tamsulosin and glipiZIDE. I am also having her maintain her diclofenac sodium, polyethylene glycol, warfarin, diphenhydrAMINE, amLODipine, losartan, and metoprolol tartrate. We will stop administering triamcinolone acetonide.  Meds ordered this encounter  Medications  . amLODipine (NORVASC) 10 MG tablet    Sig: Take 1 tablet (10 mg total) by mouth daily.    Dispense:  90 tablet    Refill:  1  . losartan (COZAAR) 100 MG tablet    Sig: Take 1 tablet (100 mg total) by mouth daily.    Dispense:  90 tablet    Refill:  1  . metoprolol tartrate (LOPRESSOR) 100 MG tablet    Sig: TAKE 1 TABLET(100 MG) BY MOUTH TWICE DAILY    Dispense:  180 tablet    Refill:  1     Follow-up: Return in about 3 months (around 02/02/2018).  [name], MD

## 2017-11-12 ENCOUNTER — Other Ambulatory Visit: Payer: Self-pay | Admitting: Family Medicine

## 2017-11-12 DIAGNOSIS — E119 Type 2 diabetes mellitus without complications: Secondary | ICD-10-CM

## 2017-12-06 ENCOUNTER — Encounter (INDEPENDENT_AMBULATORY_CARE_PROVIDER_SITE_OTHER): Payer: Medicare Other | Admitting: Ophthalmology

## 2017-12-28 ENCOUNTER — Ambulatory Visit (INDEPENDENT_AMBULATORY_CARE_PROVIDER_SITE_OTHER): Payer: Medicare Other

## 2017-12-28 ENCOUNTER — Ambulatory Visit (INDEPENDENT_AMBULATORY_CARE_PROVIDER_SITE_OTHER): Payer: Medicare Other | Admitting: Family Medicine

## 2017-12-28 ENCOUNTER — Encounter: Payer: Self-pay | Admitting: Family Medicine

## 2017-12-28 VITALS — BP 110/80 | HR 86 | Ht 62.0 in

## 2017-12-28 DIAGNOSIS — M25561 Pain in right knee: Secondary | ICD-10-CM

## 2017-12-28 DIAGNOSIS — G8929 Other chronic pain: Secondary | ICD-10-CM

## 2017-12-28 DIAGNOSIS — M25562 Pain in left knee: Secondary | ICD-10-CM

## 2017-12-28 DIAGNOSIS — Z23 Encounter for immunization: Secondary | ICD-10-CM

## 2017-12-28 MED ORDER — TRIAMCINOLONE ACETONIDE 40 MG/ML IJ SUSP
40.0000 mg | Freq: Once | INTRAMUSCULAR | Status: AC
  Administered 2017-12-28: 40 mg via INTRA_ARTICULAR

## 2017-12-28 NOTE — Patient Instructions (Signed)
Knee Injection, Care After  Refer to this sheet in the next few weeks. These instructions provide you with information about caring for yourself after your procedure. Your health care provider may also give you more specific instructions. Your treatment has been planned according to current medical practices, but problems sometimes occur. Call your health care provider if you have any problems or questions after your procedure.  What can I expect after the procedure?  After the procedure, it is common to have:   Soreness.   Warmth.   Swelling.    You may have more pain, swelling, and warmth than you did before the injection. This reaction may last for about one day.  Follow these instructions at home:  Bathing   If you were given a bandage (dressing), keep it dry until your health care provider says it can be removed. Ask your health care provider when you can start showering or taking a bath.  Managing pain, stiffness, and swelling   If directed, apply ice to the injection area:  ? Put ice in a plastic bag.  ? Place a towel between your skin and the bag.  ? Leave the ice on for 20 minutes, 2-3 times per day.   Do not apply heat to your knee.   Raise the injection area above the level of your heart while you are sitting or lying down.  Activity   Avoid strenuous activities for as long as directed by your health care provider. Ask your health care provider when you can return to your normal activities.  General instructions   Take medicines only as directed by your health care provider.   Do not take aspirin or other over-the-counter medicines unless your health care provider says you can.   Check your injection site every day for signs of infection. Watch for:  ? Redness, swelling, or pain.  ? Fluid, blood, or pus.   Follow your health care provider's instructions about dressing changes and removal.  Contact a health care provider if:   You have symptoms at your injection site that last longer than  two days after your procedure.   You have redness, swelling, or pain in your injection area.   You have fluid, blood, or pus coming from your injection site.   You have warmth in your injection area.   You have a fever.   Your pain is not controlled with medicine.  Get help right away if:   Your knee turns very red.   Your knee becomes very swollen.   Your knee pain is severe.  This information is not intended to replace advice given to you by your health care provider. Make sure you discuss any questions you have with your health care provider.  Document Released: 02/22/2014 Document Revised: 10/08/2015 Document Reviewed: 12/12/2013  Elsevier Interactive Patient Education  2018 [employer]

## 2017-12-28 NOTE — Progress Notes (Signed)
Subjective:  Patient ID: Chelsea Dean, female    DOB: Jan 08, 1928  Age: 84 y.o. MRN: [id number]  CC: knee injection   HPI [name] presents for evaluation and treatment of pain in her left knee.  This is an ongoing issue for her.  She has had pain with ambulation for years.  She uses a cane to assist her with her gait.  There is no locking or giving way.  No past history of specific injury.  She did well after an injection to her right knee and would like to have an injection into the left knee as well.  Last injection did not affect her diabetes with elevated blood sugars.  The joint never becomes red and swollen.   Outpatient Medications Prior to Visit  Medication Sig Dispense Refill  . amLODipine (NORVASC) 10 MG tablet Take 1 tablet (10 mg total) by mouth daily. 90 tablet 1  . diclofenac sodium (VOLTAREN) 1 % GEL Apply 2 g topically daily as needed (KNEE PAIN).    Marland Kitchen diphenhydrAMINE (BENADRYL) 50 MG tablet Take 1 tablet (50 mg total) by mouth as directed for 1 dose. Take Benadryl 50 mg po 1 hr prior to CT (app't 09/05/2017) 1 tablet 0  . glipiZIDE (GLUCOTROL XL) 10 MG 24 hr tablet TAKE 1 TABLET(10 MG) BY MOUTH DAILY WITH BREAKFAST 90 tablet 0  . losartan (COZAAR) 100 MG tablet Take 1 tablet (100 mg total) by mouth daily. 90 tablet 1  . metoprolol tartrate (LOPRESSOR) 100 MG tablet TAKE 1 TABLET(100 MG) BY MOUTH TWICE DAILY 180 tablet 1  . polyethylene glycol (MIRALAX / GLYCOLAX) packet Take 17 g by mouth daily. 14 each 0  . warfarin (COUMADIN) 2.5 MG tablet Take 2.5 mg by mouth daily. 2.5 mg daily except for Tuesday Thursday Saturday patient takes one half tablet (1.25 mg)     No facility-administered medications prior to visit.     ROS Review of Systems  Constitutional: Negative.   Respiratory: Negative.   Cardiovascular: Negative.   Gastrointestinal: Negative.   Musculoskeletal: Positive for arthralgias and gait problem. Negative for joint swelling and myalgias.  Skin:  Negative.   Psychiatric/Behavioral: Negative.     Objective:  BP 110/80   Pulse 86   Ht 5\' 2"  (1.575 m)   SpO2 95%   BMI 29.63 kg/m   BP Readings from Last 3 Encounters:  12/28/17 110/80  11/03/17 110/70  10/19/17 110/80    Wt Readings from Last 3 Encounters:  11/03/17 162 lb (73.5 kg)  10/19/17 161 lb 8 oz (73.3 kg)  08/17/17 164 lb (74.4 kg)    Physical Exam  Constitutional: She is oriented to person, place, and time. She appears well-developed and well-nourished.  HENT:  Head: Normocephalic and atraumatic.  Right Ear: External ear normal.  Left Ear: External ear normal.  Eyes: Right eye exhibits no discharge. Left eye exhibits no discharge. No scleral icterus.  Neck: No JVD present. No tracheal deviation present.  Pulmonary/Chest: Effort normal.  Musculoskeletal:       Left knee: She exhibits normal range of motion, no swelling, no effusion, no ecchymosis and no erythema.  Neurological: She is alert and oriented to person, place, and time.  Skin: Skin is warm and dry. No erythema.  Psychiatric: She has a normal mood and affect. Her behavior is normal.  Aspiration/Injection Procedure Note Tabor Bartram [id number] January 17, 1928  Procedure: Injection Indications: pain  Procedure Details Consent: Risks of procedure as well as the alternatives and risks of  each were explained to the (patient/caregiver).  Consent for procedure obtained. Time Out: Verified patient identification, verified procedure, site/side was marked, verified correct patient position, special equipment/implants available, medications/allergies/relevent history reviewed, required imaging and test results available.     Local Anesthesia Used:none Amount of Fluid Aspirated: none Character of Fluid: naPerformed Fluid was sent for:na A sterile dressing was applied.  Patient did not tolerate procedure well. Estimated blood loss: 0  Medial superior pole of the patella was identified and marked.  Cleansed with betadine x 3. Injected with one cc each lidocaine wo epi, marcaine .05%, and kenalog.  [name] 12/28/2017, 11:07 AM   Lab Results  Component Value Date   WBC 8.0 08/15/2017   HGB 12.8 08/15/2017   HCT 41.1 08/15/2017   PLT 92 (L) 08/15/2017   GLUCOSE 76 11/03/2017   ALT 17 08/15/2017   AST 20 08/15/2017   NA 144 11/03/2017   K 4.2 11/03/2017   CL 106 11/03/2017   CREATININE 1.06 11/03/2017   BUN 28 (H) 11/03/2017   CO2 29 11/03/2017   TSH 3.26 03/02/2017   INR 2.76 08/15/2017   HGBA1C 6.3 11/03/2017   MICROALBUR 35.7 (H) 03/02/2017    Ct Renal Stone Study  Result Date: 08/15/2017 CLINICAL DATA:  Right-sided flank pain for several hours with hematuria, initial encounter EXAM: CT ABDOMEN AND PELVIS WITHOUT CONTRAST TECHNIQUE: Multidetector CT imaging of the abdomen and pelvis was performed following the standard protocol without IV contrast. COMPARISON:  06/21/2017 FINDINGS: Lower chest: No acute abnormality.  Heart is enlarged in size. Hepatobiliary: No focal liver abnormality is seen. No gallstones, gallbladder wall thickening, or biliary dilatation. Pancreas: Unremarkable. No pancreatic ductal dilatation or surrounding inflammatory changes. Spleen: Normal in size without focal abnormality. Adrenals/Urinary Tract: Adrenal glands are within normal limits. Left kidney demonstrates some cystic change. No renal calculi or obstructive changes are noted on the left. Right kidney demonstrates significant hydronephrosis and hydroureter extending to the right UVJ. A tiny 2 mm stone is noted best seen on image number 75 of series 5. No other ureteral stones are identified. Scattered right renal stones are noted similar to that seen on the prior exam. Considerable perinephric stranding is noted on the right extending along the left ureter consistent with the obstructive change. Stomach/Bowel: Scattered diverticular change of the colon is noted without evidence of  diverticulitis. The appendix is within normal limits. No obstructive or inflammatory changes of the small bowel are noted. Vascular/Lymphatic: There are changes consistent with prior stent graft repair. The aneurysm sac is again prominent measuring approximately 7.9 cm in greatest dimension. This is relatively stable from the previous exam. No stranding is noted adjacent to the aortic sac. Some mottled density is noted within which may be related to varying degrees of resolving thrombus. Prior embolization of lumbar feeding branches noted posteriorly. No significant lymphadenopathy is noted. Reproductive: Status post hysterectomy. There is a stable cystic right ovarian lesion identified measuring 9.7 cm in greatest dimension. This has been present on multiple previous exams. No new adnexal abnormality is seen. Other: No abdominal wall hernia or abnormality. No abdominopelvic ascites. Musculoskeletal: Degenerative changes of the lumbar spine are seen. No acute bony abnormality is noted. IMPRESSION: New right-sided hydronephrosis and hydroureter secondary to a 2 mm right UVJ stone as described. Some smaller residual right renal stones are noted. Resolution of previously seen left-sided hydronephrosis. No definitive left renal stones are seen. Stent graft therapy of an abdominal aortic aneurysm. The aneurysm sac remains prominent  which may be related to a degree of endo tension. Again, nonemergent evaluation with CTA is recommended to rule out persistent endoleak. Stable right ovarian cystic lesion. Chronic changes as described above. Electronically Signed   By: Inez Catalina M.D.   On: 08/15/2017 08:50    Assessment & Plan:   Destine was seen today for knee injection.  Diagnoses and all orders for this visit:  Chronic pain of left knee -     triamcinolone acetonide (KENALOG-40) injection 40 mg -     DG Knee Complete 4 Views Left; Future  Need for influenza vaccination -     Flu vaccine HIGH DOSE  PF  Chronic pain of right knee -     DG Knee Complete 4 Views Right; Future   I am having Alaycia Eardley maintain her diclofenac sodium, polyethylene glycol, warfarin, diphenhydrAMINE, amLODipine, losartan, metoprolol tartrate, and glipiZIDE. We administered triamcinolone acetonide.  Meds ordered this encounter  Medications  . triamcinolone acetonide (KENALOG-40) injection 40 mg   Advised patient to expect soreness tomorrow.  She is to RTC immediately if the soreness last more than a day particularly if it is accompanied by swelling inflammation and/or fever.  Anticipatory guidance was given about what to expect after knee injection.  Follow-up: Return if symptoms worsen or fail to improve.  [name], MD

## 2018-01-25 ENCOUNTER — Encounter: Payer: Self-pay | Admitting: Family Medicine

## 2018-01-25 ENCOUNTER — Ambulatory Visit (INDEPENDENT_AMBULATORY_CARE_PROVIDER_SITE_OTHER): Payer: Medicare Other | Admitting: Family Medicine

## 2018-01-25 VITALS — BP 140/90 | HR 83 | Ht 62.0 in

## 2018-01-25 DIAGNOSIS — I482 Chronic atrial fibrillation, unspecified: Secondary | ICD-10-CM

## 2018-01-25 DIAGNOSIS — E119 Type 2 diabetes mellitus without complications: Secondary | ICD-10-CM

## 2018-01-25 DIAGNOSIS — E785 Hyperlipidemia, unspecified: Secondary | ICD-10-CM | POA: Diagnosis not present

## 2018-01-25 DIAGNOSIS — I10 Essential (primary) hypertension: Secondary | ICD-10-CM | POA: Diagnosis not present

## 2018-01-25 DIAGNOSIS — D696 Thrombocytopenia, unspecified: Secondary | ICD-10-CM

## 2018-01-25 DIAGNOSIS — N183 Chronic kidney disease, stage 3 unspecified: Secondary | ICD-10-CM

## 2018-01-25 DIAGNOSIS — I4811 Longstanding persistent atrial fibrillation: Secondary | ICD-10-CM

## 2018-01-25 DIAGNOSIS — R809 Proteinuria, unspecified: Secondary | ICD-10-CM

## 2018-01-25 LAB — BASIC METABOLIC PANEL
BUN: 28 mg/dL — ABNORMAL HIGH (ref 6–23)
CO2: 27 mEq/L (ref 19–32)
Calcium: 10.3 mg/dL (ref 8.4–10.5)
Chloride: 107 mEq/L (ref 96–112)
Creatinine, Ser: 0.9 mg/dL (ref 0.40–1.20)
GFR: 62.7 mL/min (ref >60.00)
Glucose, Bld: 112 mg/dL — ABNORMAL HIGH (ref 70–99)
Potassium: 4 mEq/L (ref 3.5–5.1)
SODIUM: 143 meq/L (ref 135–145)

## 2018-01-25 LAB — MICROALBUMIN / CREATININE URINE RATIO
Creatinine,U: 70.8 mg/dL
MICROALB/CREAT RATIO: 137 mg/g — AB (ref 0.0–30.0)
Microalb, Ur: 97 mg/dL — ABNORMAL HIGH (ref 0.0–1.9)

## 2018-01-25 LAB — LIPID PANEL
Cholesterol: 192 mg/dL (ref 0–200)
HDL: 43 mg/dL (ref >39.00)
LDL Cholesterol: 125 mg/dL — ABNORMAL HIGH (ref 0–99)
NonHDL: 149.17
Total CHOL/HDL Ratio: 4
Triglycerides: 122 mg/dL (ref 0.0–149.0)
VLDL: 24.4 mg/dL (ref 0.0–40.0)

## 2018-01-25 LAB — HEMOGLOBIN A1C: HEMOGLOBIN A1C: 6 % (ref 4.6–6.5)

## 2018-01-25 MED ORDER — LOSARTAN POTASSIUM 100 MG PO TABS: 100.0000 mg | ORAL_TABLET | Freq: Every day | ORAL | 1 refills | Status: DC

## 2018-01-25 MED ORDER — AMLODIPINE BESYLATE 10 MG PO TABS: 10.0000 mg | ORAL_TABLET | Freq: Every day | ORAL | 1 refills | Status: DC

## 2018-01-25 MED ORDER — GLIPIZIDE ER 10 MG PO TB24: ORAL_TABLET | ORAL | 2 refills | Status: DC

## 2018-01-25 MED ORDER — METOPROLOL TARTRATE 100 MG PO TABS: ORAL_TABLET | ORAL | 1 refills | Status: DC

## 2018-01-25 NOTE — Progress Notes (Signed)
Established Patient Office Visit  Subjective:  Patient ID: Chelsea Dean, female    DOB: 07-14-1927  Age: 84 y.o. MRN: [id number]  CC:  Chief Complaint  Patient presents with  . Follow-up    HPI Farrie Sann presents for follow up on her diabetes. She has been taking her medications as directed.  Patient continues to grieve her late husband's loss but seems to be doing better.  Hypertension and A. fib is been well controlled with the metoprolol losartan and amlodipine.  Diabetes has also responded well to the Glucotrol taken daily with her breakfast meal.  Chronic kidney disease has been stable treated with above medicines and hydration as tolerated.  Family will be coming for Christmas dinner but the celebration will be subdued this year.  Past Medical History:  Diagnosis Date  . A-fib ([place]) 10/19/2016  . Abdominal aortic aneurysm (AAA) without rupture ([place]) 09/15/2013  . Acute idiopathic gout of right foot   . AKI (acute kidney injury) ([place]) 10/18/2016  . Anticoagulated on Coumadin   . Arthritis   . Diabetes mellitus without complication ([place])   . Diverticular disease of large intestine 04/16/2013  . Diverticulosis   . DM2 (diabetes mellitus, type 2) ([place]) 10/19/2016  . Dysrhythmia    Atrial fib  . History of colon polyps 04/16/2013  . Hx of colonic polyps   . Hyperlipidemia   . Hypertension   . Long term current use of anticoagulant therapy 02/05/2015  . Lumbar radiculopathy 03/28/2014  . Microalbuminuria 04/16/2013  . Osteoarthritis 04/16/2013  . Osteoarthritis of cervical spine 03/28/2014  . Pelvic mass 10/19/2016  . Right renal mass   . SBO (small bowel obstruction) ([place]) 10/18/2016  . Thrombocytopenia ([place]) 04/16/2013   Overview:  referred to hematology 01/2104    Past Surgical History:  Procedure Laterality Date  . ABDOMINAL AORTIC ANEURYSM REPAIR    . ABDOMINAL HYSTERECTOMY    . IR GENERIC HISTORICAL  07/01/2015   IR RADIOLOGIST EVAL & MGMT 07/01/2015 [facility] INTERV RAD     Family History  Problem Relation Age of Onset  . Cancer Sister        Rectal and Stomach    Social History   Socioeconomic History  . Marital status: Married    Spouse name: Not on file  . Number of children: Not on file  . Years of education: Not on file  . Highest education level: Not on file  Occupational History  . Occupation: retired [employer]  . Financial resource strain: Not on file  . Food insecurity:    Worry: Not on file    Inability: Not on file  . Transportation needs:    Medical: Not on file    Non-medical: Not on file  Tobacco Use  . Smoking status: Never Smoker  . Smokeless tobacco: Current User    Types: Snuff  Substance and Sexual Activity  . Alcohol use: No    Alcohol/week: 0.0 standard drinks  . Drug use: No  . Sexual activity: Not on file  Lifestyle  . Physical activity:    Days per week: Not on file    Minutes per session: Not on file  . Stress: Not on file  Relationships  . Social connections:    Talks on phone: Not on file    Gets together: Not on file    Attends religious service: Not on file    Active member of club or organization: Not on file    Attends  meetings of clubs or organizations: Not on file    Relationship status: Not on file  . Intimate partner violence:    Fear of current or ex partner: Not on file    Emotionally abused: Not on file    Physically abused: Not on file    Forced sexual activity: Not on file  Other Topics Concern  . Not on file  Social History Narrative   Admitted to [facility] 10/27/16   Married   Use snuff   Alcohol none   DNR    Outpatient Medications Prior to Visit  Medication Sig Dispense Refill  . diclofenac sodium (VOLTAREN) 1 % GEL Apply 2 g topically daily as needed (KNEE PAIN).    Marland Kitchen diphenhydrAMINE (BENADRYL) 50 MG tablet Take 1 tablet (50 mg total) by mouth as directed for 1 dose. Take Benadryl 50 mg po 1 hr prior to CT (app't 09/05/2017) 1 tablet 0  .  polyethylene glycol (MIRALAX / GLYCOLAX) packet Take 17 g by mouth daily. 14 each 0  . warfarin (COUMADIN) 2.5 MG tablet Take 2.5 mg by mouth daily. 2.5 mg daily except for Tuesday Thursday Saturday patient takes one half tablet (1.25 mg)    . amLODipine (NORVASC) 10 MG tablet Take 1 tablet (10 mg total) by mouth daily. 90 tablet 1  . glipiZIDE (GLUCOTROL XL) 10 MG 24 hr tablet TAKE 1 TABLET(10 MG) BY MOUTH DAILY WITH BREAKFAST 90 tablet 0  . losartan (COZAAR) 100 MG tablet Take 1 tablet (100 mg total) by mouth daily. 90 tablet 1  . metoprolol tartrate (LOPRESSOR) 100 MG tablet TAKE 1 TABLET(100 MG) BY MOUTH TWICE DAILY 180 tablet 1   No facility-administered medications prior to visit.     Allergies  Allergen Reactions  . Ace Inhibitors   . Contrast Media [Iodinated Diagnostic Agents]   . Nsaids     ROS Review of Systems  Constitutional: Negative.   HENT: Negative.   Eyes: Negative for photophobia and visual disturbance.  Respiratory: Negative.   Cardiovascular: Negative.   Gastrointestinal: Negative.   Endocrine: Negative for polyphagia and polyuria.  Genitourinary: Negative.   Musculoskeletal: Negative for joint swelling and myalgias.  Skin: Negative for color change and pallor.  Allergic/Immunologic: Negative for immunocompromised state.  Neurological: Positive for speech difficulty, light-headedness and numbness.  Psychiatric/Behavioral: Negative.       Objective:    Physical Exam  Constitutional: She is oriented to person, place, and time. She appears well-developed and well-nourished. No distress.  HENT:  Head: Atraumatic.  Right Ear: External ear normal.  Left Ear: External ear normal.  Mouth/Throat: Oropharynx is clear and moist. No oropharyngeal exudate.  Eyes: Pupils are equal, round, and reactive to light. Conjunctivae are normal. Right eye exhibits no discharge. Left eye exhibits no discharge. No scleral icterus.  Neck: Neck supple. No JVD present. No  tracheal deviation present. No thyromegaly present.  Cardiovascular: Normal rate and normal heart sounds. An irregularly irregular rhythm present.  Pulmonary/Chest: Effort normal and breath sounds normal. No stridor.  Abdominal: Bowel sounds are normal.  Lymphadenopathy:    She has no cervical adenopathy.  Neurological: She is alert and oriented to person, place, and time.  Skin: Skin is warm and dry. She is not diaphoretic.  Psychiatric: She has a normal mood and affect. Her behavior is normal.    BP 140/90   Pulse 83   Ht 5\' 2"  (1.575 m)   SpO2 98%   BMI 29.63 kg/m  Wt Readings from Last 3 Encounters:  11/03/17 162 lb (73.5 kg)  10/19/17 161 lb 8 oz (73.3 kg)  08/17/17 164 lb (74.4 kg)   BP Readings from Last 3 Encounters:  01/25/18 140/90  12/28/17 110/80  11/03/17 110/70   Health Maintenance Due  Topic Date Due  . FOOT EXAM  05/11/1939  . OPHTHALMOLOGY EXAM  05/11/1939  . TETANUS/TDAP  05/10/1948  . DEXA SCAN  05/11/1994    There are no preventive care reminders to display for this patient.  Lab Results  Component Value Date   TSH 3.26 03/02/2017   Lab Results  Component Value Date   WBC 8.0 08/15/2017   HGB 12.8 08/15/2017   HCT 41.1 08/15/2017   MCV 84.4 08/15/2017   PLT 92 (L) 08/15/2017   Lab Results  Component Value Date   NA 144 11/03/2017   K 4.2 11/03/2017   CO2 29 11/03/2017   GLUCOSE 76 11/03/2017   BUN 28 (H) 11/03/2017   CREATININE 1.06 11/03/2017   BILITOT 0.5 08/15/2017   ALKPHOS 62 08/15/2017   AST 20 08/15/2017   ALT 17 08/15/2017   PROT 7.7 08/15/2017   ALBUMIN 4.4 08/15/2017   CALCIUM 10.0 11/03/2017   ANIONGAP 9 08/15/2017   GFR 51.94 (L) 11/03/2017   No results found for: CHOL No results found for: HDL No results found for: LDLCALC No results found for: TRIG No results found for: CHOLHDL Lab Results  Component Value Date   HGBA1C 6.3 11/03/2017      Assessment & Plan:   Problem List Items Addressed This Visit        Cardiovascular and Mediastinum   A-fib (HCC) (Chronic)   Relevant Medications   amLODipine (NORVASC) 10 MG tablet   losartan (COZAAR) 100 MG tablet   metoprolol tartrate (LOPRESSOR) 100 MG tablet   Hypertension - Primary   Relevant Medications   amLODipine (NORVASC) 10 MG tablet   losartan (COZAAR) 100 MG tablet   metoprolol tartrate (LOPRESSOR) 100 MG tablet   Other Relevant Orders   Basic metabolic panel     Endocrine   DM2 (diabetes mellitus, type 2) (HCC) (Chronic)   Relevant Medications   glipiZIDE (GLUCOTROL XL) 10 MG 24 hr tablet   losartan (COZAAR) 100 MG tablet   Other Relevant Orders   Hemoglobin A1c   Urine Microalbumin w/creat. ratio     Genitourinary   Stage 3 chronic kidney disease (HCC)   Relevant Medications   losartan (COZAAR) 100 MG tablet     Other   Microalbuminuria   Thrombocytopenia (HCC)   Hyperlipidemia   Relevant Medications   amLODipine (NORVASC) 10 MG tablet   losartan (COZAAR) 100 MG tablet   metoprolol tartrate (LOPRESSOR) 100 MG tablet   Other Relevant Orders   Lipid panel      Meds ordered this encounter  Medications  . amLODipine (NORVASC) 10 MG tablet    Sig: Take 1 tablet (10 mg total) by mouth daily.    Dispense:  90 tablet    Refill:  1  . glipiZIDE (GLUCOTROL XL) 10 MG 24 hr tablet    Sig: TAKE 1 TABLET(10 MG) BY MOUTH DAILY WITH BREAKFAST    Dispense:  90 tablet    Refill:  2  . losartan (COZAAR) 100 MG tablet    Sig: Take 1 tablet (100 mg total) by mouth daily.    Dispense:  90 tablet    Refill:  1  . metoprolol tartrate (LOPRESSOR) 100 MG  tablet    Sig: TAKE 1 TABLET(100 MG) BY MOUTH TWICE DAILY    Dispense:  180 tablet    Refill:  1    Follow-up: Return in about 3 months (around 04/26/2018).

## 2018-04-01 ENCOUNTER — Other Ambulatory Visit: Payer: Self-pay

## 2018-04-01 ENCOUNTER — Encounter (HOSPITAL_BASED_OUTPATIENT_CLINIC_OR_DEPARTMENT_OTHER): Payer: Self-pay | Admitting: Emergency Medicine

## 2018-04-01 ENCOUNTER — Emergency Department (HOSPITAL_BASED_OUTPATIENT_CLINIC_OR_DEPARTMENT_OTHER)
Admission: EM | Admit: 2018-04-01 | Discharge: 2018-04-01 | Disposition: A | Discharge status: Home / Self Care | Payer: Medicare Other | Attending: Emergency Medicine | Admitting: Emergency Medicine

## 2018-04-01 ENCOUNTER — Emergency Department (HOSPITAL_BASED_OUTPATIENT_CLINIC_OR_DEPARTMENT_OTHER): Payer: Medicare Other

## 2018-04-01 DIAGNOSIS — N183 Chronic kidney disease, stage 3 (moderate): Secondary | ICD-10-CM | POA: Insufficient documentation

## 2018-04-01 DIAGNOSIS — Z7901 Long term (current) use of anticoagulants: Secondary | ICD-10-CM | POA: Insufficient documentation

## 2018-04-01 DIAGNOSIS — Z7984 Long term (current) use of oral hypoglycemic drugs: Secondary | ICD-10-CM | POA: Diagnosis not present

## 2018-04-01 DIAGNOSIS — N201 Calculus of ureter: Secondary | ICD-10-CM

## 2018-04-01 DIAGNOSIS — R1031 Right lower quadrant pain: Secondary | ICD-10-CM | POA: Diagnosis present

## 2018-04-01 DIAGNOSIS — E1122 Type 2 diabetes mellitus with diabetic chronic kidney disease: Secondary | ICD-10-CM | POA: Diagnosis not present

## 2018-04-01 DIAGNOSIS — I129 Hypertensive chronic kidney disease with stage 1 through stage 4 chronic kidney disease, or unspecified chronic kidney disease: Secondary | ICD-10-CM | POA: Diagnosis not present

## 2018-04-01 LAB — BASIC METABOLIC PANEL
ANION GAP: 6 (ref 5–15)
BUN: 32 mg/dL — ABNORMAL HIGH (ref 8–23)
CO2: 25 mmol/L (ref 22–32)
Calcium: 10 mg/dL (ref 8.9–10.3)
Chloride: 107 mmol/L (ref 98–111)
Creatinine, Ser: 1.12 mg/dL — ABNORMAL HIGH (ref 0.44–1.00)
GFR calc Af Amer: 51 mL/min — ABNORMAL LOW (ref >60)
GFR calc non Af Amer: 44 mL/min — ABNORMAL LOW (ref >60)
Glucose, Bld: 173 mg/dL — ABNORMAL HIGH (ref 70–99)
Potassium: 3.6 mmol/L (ref 3.5–5.1)
Sodium: 138 mmol/L (ref 135–145)

## 2018-04-01 LAB — CBC WITH DIFFERENTIAL/PLATELET
Abs Immature Granulocytes: 0.05 10*3/uL (ref 0.00–0.07)
Basophils Absolute: 0 10*3/uL (ref 0.0–0.1)
Basophils Relative: 0 %
EOS PCT: 1 %
Eosinophils Absolute: 0 10*3/uL (ref 0.0–0.5)
HCT: 46 % (ref 36.0–46.0)
Hemoglobin: 13.3 g/dL (ref 12.0–15.0)
Immature Granulocytes: 1 %
Lymphocytes Relative: 8 %
Lymphs Abs: 0.7 10*3/uL (ref 0.7–4.0)
MCH: 26.1 pg (ref 26.0–34.0)
MCHC: 28.9 g/dL — ABNORMAL LOW (ref 30.0–36.0)
MCV: 90.2 fL (ref 80.0–100.0)
Monocytes Absolute: 0.8 10*3/uL (ref 0.1–1.0)
Monocytes Relative: 9 %
NRBC: 0 % (ref 0.0–0.2)
Neutro Abs: 7.1 10*3/uL (ref 1.7–7.7)
Neutrophils Relative %: 81 %
Platelets: 105 10*3/uL — ABNORMAL LOW (ref 150–400)
RBC: 5.1 MIL/uL (ref 3.87–5.11)
RDW: 14.6 % (ref 11.5–15.5)
Smear Review: NORMAL
WBC: 8.7 10*3/uL (ref 4.0–10.5)

## 2018-04-01 LAB — URINALYSIS, ROUTINE W REFLEX MICROSCOPIC
Bilirubin Urine: NEGATIVE
Glucose, UA: NEGATIVE mg/dL
Ketones, ur: NEGATIVE mg/dL
Leukocytes,Ua: NEGATIVE
Nitrite: NEGATIVE
PROTEIN: 100 mg/dL — AB
Specific Gravity, Urine: >1.03 — ABNORMAL HIGH (ref 1.005–1.030)
pH: 5.5 (ref 5.0–8.0)

## 2018-04-01 LAB — URINALYSIS, MICROSCOPIC (REFLEX)

## 2018-04-01 LAB — PROTIME-INR
INR: 2.29
Prothrombin Time: 24.9 seconds — ABNORMAL HIGH (ref 11.4–15.2)

## 2018-04-01 MED ORDER — HYDROCODONE-ACETAMINOPHEN 5-325 MG PO TABS: 1.0000 | ORAL_TABLET | Freq: Four times a day (QID) | ORAL | 0 refills | Status: DC | PRN

## 2018-04-01 MED ORDER — ONDANSETRON HCL 4 MG PO TABS: 4.0000 mg | ORAL_TABLET | Freq: Three times a day (TID) | ORAL | 0 refills | Status: DC | PRN

## 2018-04-01 MED ORDER — SODIUM CHLORIDE 0.9 % IV BOLUS
500.0000 mL | Freq: Once | INTRAVENOUS | Status: AC
  Administered 2018-04-01: 500 mL via INTRAVENOUS

## 2018-04-01 MED ORDER — ONDANSETRON HCL 4 MG/2ML IJ SOLN
4.0000 mg | Freq: Once | INTRAMUSCULAR | Status: AC
  Administered 2018-04-01: 4 mg via INTRAVENOUS
  Filled 2018-04-01: qty 2

## 2018-04-01 MED ORDER — HYDROCODONE-ACETAMINOPHEN 5-325 MG PO TABS
1.0000 | ORAL_TABLET | Freq: Once | ORAL | Status: AC
  Administered 2018-04-01: 1 via ORAL
  Filled 2018-04-01: qty 1

## 2018-04-01 NOTE — ED Provider Notes (Signed)
[facility] EMERGENCY DEPARTMENT Provider Note   CSN: [id number] Arrival date & time: 04/01/18  1740     History   Chief Complaint Chief Complaint  Patient presents with  . Flank Pain    HPI Chelsea Dean is a 85 y.o. female with history of atrial fibrillation anticoagulated on Coumadin, diabetes, hypertension, kidney stones who presents with sudden right flank pain rating to her right abdomen that began around 1 PM.  She reports it feels similar to past kidney stones.  She has had associated nausea, but no vomiting. She denies any urinary symptoms.  Patient did not take any medication prior to arrival.  She has never needed a procedure for her kidney stones.  She has always passed them.  HPI  Past Medical History:  Diagnosis Date  . A-fib ([place]) 10/19/2016  . Abdominal aortic aneurysm (AAA) without rupture ([place]) 09/15/2013  . Acute idiopathic gout of right foot   . AKI (acute kidney injury) ([place]) 10/18/2016  . Anticoagulated on Coumadin   . Arthritis   . Diabetes mellitus without complication ([place])   . Diverticular disease of large intestine 04/16/2013  . Diverticulosis   . DM2 (diabetes mellitus, type 2) ([place]) 10/19/2016  . Dysrhythmia    Atrial fib  . History of colon polyps 04/16/2013  . Hx of colonic polyps   . Hyperlipidemia   . Hypertension   . Long term current use of anticoagulant therapy 02/05/2015  . Lumbar radiculopathy 03/28/2014  . Microalbuminuria 04/16/2013  . Osteoarthritis 04/16/2013  . Osteoarthritis of cervical spine 03/28/2014  . Pelvic mass 10/19/2016  . Right renal mass   . SBO (small bowel obstruction) ([employer] [employer]) 10/18/2016  . Thrombocytopenia ([place]) 04/16/2013   Overview:  referred to hematology 01/2104    Patient Active Problem List   Diagnosis Date Noted  . Need for influenza vaccination 12/28/2017  . Chronic pain of left knee 10/19/2017  . Stage 3 chronic kidney disease ([place]) 07/25/2017  . Hypomagnesemia 03/31/2017  . 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/31/2017  .  Abnormal urinalysis 03/16/2017  . Hypokalemia 10/31/2016  . Lactic acidosis 10/31/2016  . Atrial fibrillation with RVR ([place]) 10/31/2016  . Elevated liver enzymes 10/31/2016  . Anticoagulated on Coumadin 10/31/2016  . Elevated INR 10/31/2016  . Hyperlipidemia 10/31/2016  . Hypertension 10/31/2016  . Acute idiopathic gout of right foot   . A-fib ([place]) 10/19/2016  . DM2 (diabetes mellitus, type 2) ([place]) 10/19/2016  . Pelvic mass 10/19/2016  . SBO (small bowel obstruction) ([place]) 10/18/2016  . Long term current use of anticoagulant therapy 02/05/2015  . Lumbar radiculopathy 03/28/2014  . Osteoarthritis of cervical spine 03/28/2014  . Abdominal aortic aneurysm (AAA) without rupture ([place]) 09/15/2013  . Diverticular disease of large intestine 04/16/2013  . History of colon polyps 04/16/2013  . Microalbuminuria 04/16/2013  . Osteoarthritis 04/16/2013  . Thrombocytopenia ([place]) 04/16/2013    Past Surgical History:  Procedure Laterality Date  . ABDOMINAL AORTIC ANEURYSM REPAIR    . ABDOMINAL HYSTERECTOMY    . IR GENERIC HISTORICAL  07/01/2015   IR RADIOLOGIST EVAL & MGMT 07/01/2015 [facility] INTERV RAD     OB History   No obstetric history on file.      Home Medications    Prior to Admission medications   Medication Sig Start Date End Date Taking? Authorizing Provider  amLODipine (NORVASC) 10 MG tablet Take 1 tablet (10 mg total) by mouth daily. 01/25/18   [name], MD  diclofenac sodium (VOLTAREN) 1 % GEL Apply 2  g topically daily as needed (KNEE PAIN).    [provider]  diphenhydrAMINE (BENADRYL) 50 MG tablet Take 1 tablet (50 mg total) by mouth as directed for 1 dose. Take Benadryl 50 mg po 1 hr prior to CT (app't 09/05/2017) 09/05/17   Jacqulynn Cadet, MD  glipiZIDE (GLUCOTROL XL) 10 MG 24 hr tablet TAKE 1 TABLET(10 MG) BY MOUTH DAILY WITH BREAKFAST 01/25/18   [name], MD  HYDROcodone-acetaminophen (NORCO/VICODIN) 5-325 MG tablet Take 1 tablet  by mouth every 6 (six) hours as needed for severe pain. 04/01/18   Tonyia Marschall, Bea Graff, PA-C  losartan (COZAAR) 100 MG tablet Take 1 tablet (100 mg total) by mouth daily. 01/25/18   [name], MD  metoprolol tartrate (LOPRESSOR) 100 MG tablet TAKE 1 TABLET(100 MG) BY MOUTH TWICE DAILY 01/25/18   [name], MD  ondansetron (ZOFRAN) 4 MG tablet Take 1 tablet (4 mg total) by mouth every 8 (eight) hours as needed for nausea or vomiting. 04/01/18   Arely Tinner, Bea Graff, PA-C  polyethylene glycol (MIRALAX / GLYCOLAX) packet Take 17 g by mouth daily. 10/28/16   Dessa Phi, DO  warfarin (COUMADIN) 2.5 MG tablet Take 2.5 mg by mouth daily. 2.5 mg daily except for Tuesday Thursday Saturday patient takes one half tablet (1.25 mg)    [provider]    Family History Family History  Problem Relation Age of Onset  . Cancer Sister        Rectal and Stomach    Social History Social History   Tobacco Use  . Smoking status: Never Smoker  . Smokeless tobacco: Current User    Types: Snuff  Substance Use Topics  . Alcohol use: No    Alcohol/week: 0.0 standard drinks  . Drug use: No     Allergies   Ace inhibitors; Contrast media [iodinated diagnostic agents]; and Nsaids   Review of Systems Review of Systems  Constitutional: Negative for chills and fever.  HENT: Negative for facial swelling and sore throat.   Respiratory: Negative for shortness of breath.   Cardiovascular: Negative for chest pain.  Gastrointestinal: Positive for abdominal pain and nausea. Negative for vomiting.  Genitourinary: Positive for flank pain. Negative for dysuria.  Musculoskeletal: Negative for back pain.  Skin: Negative for rash and wound.  Neurological: Negative for headaches.  Psychiatric/Behavioral: The patient is not nervous/anxious.      Physical Exam Updated Vital Signs BP (!) 163/90 (BP Location: Right Arm)   Pulse 80   Temp 98.3 F (36.8 C) (Oral)   Resp (!) 30   Ht 5'  2" (1.575 m)   Wt 77.1 kg   SpO2 92%   BMI 31.09 kg/m   Physical Exam Vitals signs and nursing note reviewed.  Constitutional:      General: She is not in acute distress.    Appearance: She is well-developed. She is not diaphoretic.  HENT:     Head: Normocephalic and atraumatic.     Mouth/Throat:     Pharynx: No oropharyngeal exudate.  Eyes:     General: No scleral icterus.       Right eye: No discharge.        Left eye: No discharge.     Conjunctiva/sclera: Conjunctivae normal.     Pupils: Pupils are equal, round, and reactive to light.  Neck:     Musculoskeletal: Normal range of motion and neck supple.     Thyroid: No thyromegaly.  Cardiovascular:     Rate and  Rhythm: Normal rate and regular rhythm.     Heart sounds: Normal heart sounds. No murmur. No friction rub. No gallop.   Pulmonary:     Effort: Pulmonary effort is normal. No respiratory distress.     Breath sounds: Normal breath sounds. No stridor. No wheezing or rales.  Abdominal:     General: Bowel sounds are normal. There is no distension.     Palpations: Abdomen is soft.     Tenderness: There is abdominal tenderness. There is no right CVA tenderness, left CVA tenderness, guarding or rebound.    Lymphadenopathy:     Cervical: No cervical adenopathy.  Skin:    General: Skin is warm and dry.     Coloration: Skin is not pale.     Findings: No rash.  Neurological:     Mental Status: She is alert.     Coordination: Coordination normal.      ED Treatments / Results  Labs (all labs ordered are listed, but only abnormal results are displayed) Labs Reviewed  URINALYSIS, ROUTINE W REFLEX MICROSCOPIC - Abnormal; Notable for the following components:      Result Value   Specific Gravity, Urine >1.030 (*)    Hgb urine dipstick SMALL (*)    Protein, ur 100 (*)    All other components within normal limits  URINALYSIS, MICROSCOPIC (REFLEX) - Abnormal; Notable for the following components:   Bacteria, UA RARE (*)     All other components within normal limits  CBC WITH DIFFERENTIAL/PLATELET - Abnormal; Notable for the following components:   MCHC 28.9 (*)    Platelets 105 (*)    All other components within normal limits  BASIC METABOLIC PANEL - Abnormal; Notable for the following components:   Glucose, Bld 173 (*)    BUN 32 (*)    Creatinine, Ser 1.12 (*)    GFR calc non Af Amer 44 (*)    GFR calc Af Amer 51 (*)    All other components within normal limits  PROTIME-INR - Abnormal; Notable for the following components:   Prothrombin Time 24.9 (*)    All other components within normal limits  CBC WITH DIFFERENTIAL/PLATELET    EKG None  Radiology Ct Renal Stone Study  Result Date: 04/01/2018 CLINICAL DATA:  Right flank pain for 8 hours with nausea vomiting EXAM: CT ABDOMEN AND PELVIS WITHOUT CONTRAST TECHNIQUE: Multidetector CT imaging of the abdomen and pelvis was performed following the standard protocol without IV contrast. COMPARISON:  September 05, 2017, August 15, 2017 FINDINGS: Lower chest: The heart size is enlarged. Mild atelectasis of the lung bases are noted. Hepatobiliary: No focal liver abnormality is seen. No gallstones, gallbladder wall thickening, or biliary dilatation. Pancreas: Focal pancreatic calcification is unchanged compared to prior exam. The pancreas is otherwise unremarkable. Spleen: Spleen is unremarkable. Adrenals/Urinary Tract: There is right hydronephrosis with right perinephric stranding and fluid probably due to obstruction by 2 mm stone in the right ureteral vesicular junction. There is no left hydronephrosis. Nonobstructing stones are identified in the right kidney. Left kidney cysts are noted. The bladder is unremarkable. Stomach/Bowel: Stomach is within normal limits. The appendix is not seen but no inflammation is noted around cecum. No evidence of bowel wall thickening, distention, or inflammatory changes. Vascular/Lymphatic: Patient status post prior stent graft repair of  the aorta. The aortic aneurysm sac measures 7.9 cm in diameter. This is stable. No stranding is identified around the aorta sac. No lymphadenopathy is identified. Reproductive: There is a 4.8 x  6.5 cm right adnexal cyst smaller compared to prior exam. Patient status post prior cholecystectomy. Other: None. Musculoskeletal: Degenerative joint changes of the spine are identified. IMPRESSION: Right hydronephrosis due to obstruction by a 2 mm stone in the right ureteral vesicular junction. Status post prior stent graft repair of the aorta. The aortic aneurysm sac measures 7.9 cm in diameter is unchanged compared prior CT. 6.5 cm right adnexal cyst, smaller compared to prior exam. Electronically Signed   By: Abelardo Diesel M.D.   On: 04/01/2018 19:52    Procedures Procedures (including critical Dean time)  Medications Ordered in ED Medications  ondansetron (ZOFRAN) injection 4 mg (4 mg Intravenous Given 04/01/18 1826)  sodium chloride 0.9 % bolus 500 mL (0 mLs Intravenous Stopped 04/01/18 2127)  HYDROcodone-acetaminophen (NORCO/VICODIN) 5-325 MG per tablet 1 tablet (1 tablet Oral Given 04/01/18 2126)     Initial Impression / Assessment and Plan / ED Course  I have reviewed the triage vital signs and the nursing notes.  Pertinent labs & imaging results that were available during my Dean of the patient were reviewed by me and considered in my medical decision making (see chart for details).     Patient presenting with right ureteral stone.  There is a 2 mm stone seen at the right UVJ.  There is right hydronephrosis.  Patient declined pain medication until discharge.  Patient given Zofran.  UA is negative for infection.  There is mild dehydration with BUN 32, creatinine 1.12.  Fluids given in the ED.  INR 2.29.  Platelets 105, patient with history of thrombocytopenia.  Patient found to have some oxygen desaturations at 88 to 89%.  Patient was dozing off.  I feel this is probably related to hypoventilation.   Patient ambulated with no shortness of breath with oxygen saturations around 92%.  Family does not feel patient is breathing any differently than normal.  She has no history of breathing problems.  Patient will be discharged home with short course of Norco for pain, Zofran and follow-up to urology.  Patient has taken Norco before for pain and tolerated well.  Return precautions discussed.  Patient and family understand and agree with plan.  Patient vital stable throughout ED course and discharged in satisfactory condition.  Patient also evaluated by my attending, Dr. Regenia Skeeter, who guided the patient's management and agrees with plan.  Final Clinical Impressions(s) / ED Diagnoses   Final diagnoses:  Ureterolithiasis    ED Discharge Orders         Ordered    HYDROcodone-acetaminophen (NORCO/VICODIN) 5-325 MG tablet  Every 6 hours PRN     04/01/18 2100    ondansetron (ZOFRAN) 4 MG tablet  Every 8 hours PRN     04/01/18 2100           Frederica Kuster, PA-C 04/02/18 0005    Sherwood Gambler, MD 04/03/18 [id number]

## 2018-04-01 NOTE — Discharge Instructions (Addendum)
For severe pain, take 1 Norco every 6 hours.  Do not drive or operate machinery while taking this medication.  Take Zofran every 6 hours as needed for nausea or vomiting.  Please follow-up with Dr. Clydene Laming, urologist, for further evaluation and treatment of your kidney stones.  Please return the emergency department you develop any new or worsening symptoms.  Do not drink alcohol, drive, operate machinery or participate in any other potentially dangerous activities while taking opiate pain medication as it may make you sleepy. Do not take this medication with any other sedating medications, either prescription or over-the-counter. If you were prescribed Percocet or Vicodin, do not take these with acetaminophen (Tylenol) as it is already contained within these medications and overdose of Tylenol is dangerous.   This medication is an opiate (or narcotic) pain medication and can be habit forming.  Use it as little as possible to achieve adequate pain control.  Do not use or use it with extreme caution if you have a history of opiate abuse or dependence. This medication is intended for your use only - do not give any to anyone else and keep it in a secure place where nobody else, especially children, have access to it. It will also cause or worsen constipation, so you may want to consider taking an over-the-counter stool softener while you are taking this medication.

## 2018-04-01 NOTE — ED Notes (Addendum)
EDP notified of pt resp rate. Pt noted to be tachypneic but is in NAD. EDP at bedside.

## 2018-04-01 NOTE — ED Notes (Signed)
Pt ambulated around dept on room air. Sats 90-92 Hr 86- 90. No distress noted.

## 2018-04-01 NOTE — ED Triage Notes (Signed)
R flank pain with nausea since noon today. Hx of kidney stones.

## 2018-04-19 ENCOUNTER — Encounter: Payer: Self-pay | Admitting: Family Medicine

## 2018-04-19 ENCOUNTER — Ambulatory Visit (INDEPENDENT_AMBULATORY_CARE_PROVIDER_SITE_OTHER): Payer: Medicare Other | Admitting: Family Medicine

## 2018-04-19 VITALS — BP 124/80 | HR 82 | Ht 62.0 in | Wt 166.0 lb

## 2018-04-19 DIAGNOSIS — N183 Chronic kidney disease, stage 3 unspecified: Secondary | ICD-10-CM

## 2018-04-19 DIAGNOSIS — E119 Type 2 diabetes mellitus without complications: Secondary | ICD-10-CM | POA: Diagnosis not present

## 2018-04-19 DIAGNOSIS — I482 Chronic atrial fibrillation, unspecified: Secondary | ICD-10-CM

## 2018-04-19 DIAGNOSIS — E785 Hyperlipidemia, unspecified: Secondary | ICD-10-CM | POA: Diagnosis not present

## 2018-04-19 DIAGNOSIS — I10 Essential (primary) hypertension: Secondary | ICD-10-CM

## 2018-04-19 DIAGNOSIS — I4811 Longstanding persistent atrial fibrillation: Secondary | ICD-10-CM | POA: Diagnosis not present

## 2018-04-19 LAB — CBC
HCT: 41.7 % (ref 36.0–46.0)
Hemoglobin: 13.2 g/dL (ref 12.0–15.0)
MCHC: 31.6 g/dL (ref 30.0–36.0)
MCV: 84.9 fl (ref 78.0–100.0)
Platelets: 123 10*3/uL — ABNORMAL LOW (ref 150.0–400.0)
RBC: 4.91 Mil/uL (ref 3.87–5.11)
RDW: 15 % (ref 11.5–15.5)
WBC: 5.6 10*3/uL (ref 4.0–10.5)

## 2018-04-19 LAB — BASIC METABOLIC PANEL
BUN: 26 mg/dL — ABNORMAL HIGH (ref 6–23)
CALCIUM: 10.2 mg/dL (ref 8.4–10.5)
CO2: 28 mEq/L (ref 19–32)
Chloride: 107 mEq/L (ref 96–112)
Creatinine, Ser: 1.07 mg/dL (ref 0.40–1.20)
GFR: 48.29 mL/min — AB (ref >60.00)
Glucose, Bld: 106 mg/dL — ABNORMAL HIGH (ref 70–99)
Potassium: 4.6 mEq/L (ref 3.5–5.1)
Sodium: 143 mEq/L (ref 135–145)

## 2018-04-19 LAB — LIPID PANEL
Cholesterol: 210 mg/dL — ABNORMAL HIGH (ref 0–200)
HDL: 40.5 mg/dL (ref >39.00)
LDL Cholesterol: 143 mg/dL — ABNORMAL HIGH (ref 0–99)
NonHDL: 169.53
TRIGLYCERIDES: 134 mg/dL (ref 0.0–149.0)
Total CHOL/HDL Ratio: 5
VLDL: 26.8 mg/dL (ref 0.0–40.0)

## 2018-04-19 LAB — HEMOGLOBIN A1C: Hgb A1c MFr Bld: 6.3 % (ref 4.6–6.5)

## 2018-04-19 LAB — LDL CHOLESTEROL, DIRECT: Direct LDL: 152 mg/dL

## 2018-04-19 MED ORDER — GLIPIZIDE ER 10 MG PO TB24: ORAL_TABLET | ORAL | 2 refills | Status: DC

## 2018-04-19 MED ORDER — METOPROLOL TARTRATE 100 MG PO TABS: ORAL_TABLET | ORAL | 1 refills | Status: DC

## 2018-04-19 MED ORDER — AMLODIPINE BESYLATE 10 MG PO TABS: 10.0000 mg | ORAL_TABLET | Freq: Every day | ORAL | 1 refills | Status: DC

## 2018-04-19 NOTE — Progress Notes (Signed)
Established Patient Office Visit  Subjective:  Patient ID: Chelsea Dean, female    DOB: 1927/12/13  Age: 85 y.o. MRN: [id number]  CC:  Chief Complaint  Patient presents with  . Follow-up    HPI [name] presents for follow-up of her diabetes, chronic kidney disease and elevated cholesterol.  Blood pressure and pulse rate have been well controlled with the losartan and Lopressor.  Diabetes is well controlled with Glucotrol taken with breakfast.  She is responsible independently eats breakfast after taking her current control.  She is almost 33 and continues to live independently.  Status post recent ER visit for renal lithiasis.  She wonders if drinking too much tea could be related.  Past Medical History:  Diagnosis Date  . A-fib ([place]) 10/19/2016  . Abdominal aortic aneurysm (AAA) without rupture ([place]) 09/15/2013  . Acute idiopathic gout of right foot   . AKI (acute kidney injury) ([place]) 10/18/2016  . Anticoagulated on Coumadin   . Arthritis   . Diabetes mellitus without complication ([place])   . Diverticular disease of large intestine 04/16/2013  . Diverticulosis   . DM2 (diabetes mellitus, type 2) ([place]) 10/19/2016  . Dysrhythmia    Atrial fib  . History of colon polyps 04/16/2013  . Hx of colonic polyps   . Hyperlipidemia   . Hypertension   . Long term current use of anticoagulant therapy 02/05/2015  . Lumbar radiculopathy 03/28/2014  . Microalbuminuria 04/16/2013  . Osteoarthritis 04/16/2013  . Osteoarthritis of cervical spine 03/28/2014  . Pelvic mass 10/19/2016  . Right renal mass   . SBO (small bowel obstruction) ([place]) 10/18/2016  . Thrombocytopenia ([place]) 04/16/2013   Overview:  referred to hematology 01/2104    Past Surgical History:  Procedure Laterality Date  . ABDOMINAL AORTIC ANEURYSM REPAIR    . ABDOMINAL HYSTERECTOMY    . IR GENERIC HISTORICAL  07/01/2015   IR RADIOLOGIST EVAL & MGMT 07/01/2015 [facility] INTERV RAD    Family History  Problem Relation Age of Onset  . Cancer  Sister        Rectal and Stomach    Social History   Socioeconomic History  . Marital status: Married    Spouse name: Not on file  . Number of children: Not on file  . Years of education: Not on file  . Highest education level: Not on file  Occupational History  . Occupation: retired [employer]  . Financial resource strain: Not on file  . Food insecurity:    Worry: Not on file    Inability: Not on file  . Transportation needs:    Medical: Not on file    Non-medical: Not on file  Tobacco Use  . Smoking status: Never Smoker  . Smokeless tobacco: Current User    Types: Snuff  Substance and Sexual Activity  . Alcohol use: No    Alcohol/week: 0.0 standard drinks  . Drug use: No  . Sexual activity: Not on file  Lifestyle  . Physical activity:    Days per week: Not on file    Minutes per session: Not on file  . Stress: Not on file  Relationships  . Social connections:    Talks on phone: Not on file    Gets together: Not on file    Attends religious service: Not on file    Active member of club or organization: Not on file    Attends meetings of clubs or organizations: Not on file    Relationship  status: Not on file  . Intimate partner violence:    Fear of current or ex partner: Not on file    Emotionally abused: Not on file    Physically abused: Not on file    Forced sexual activity: Not on file  Other Topics Concern  . Not on file  Social History Narrative   Admitted to [facility] 10/27/16   Married   Use snuff   Alcohol none   DNR    Outpatient Medications Prior to Visit  Medication Sig Dispense Refill  . diclofenac sodium (VOLTAREN) 1 % GEL Apply 2 g topically daily as needed (KNEE PAIN).    Marland Kitchen diphenhydrAMINE (BENADRYL) 50 MG tablet Take 1 tablet (50 mg total) by mouth as directed for 1 dose. Take Benadryl 50 mg po 1 hr prior to CT (app't 09/05/2017) 1 tablet 0  . losartan (COZAAR) 100 MG tablet Take 1 tablet (100 mg total) by  mouth daily. 90 tablet 1  . polyethylene glycol (MIRALAX / GLYCOLAX) packet Take 17 g by mouth daily. 14 each 0  . warfarin (COUMADIN) 2.5 MG tablet Take 2.5 mg by mouth daily. 2.5 mg daily except for Tuesday Thursday Saturday patient takes one half tablet (1.25 mg)    . amLODipine (NORVASC) 10 MG tablet Take 1 tablet (10 mg total) by mouth daily. 90 tablet 1  . glipiZIDE (GLUCOTROL XL) 10 MG 24 hr tablet TAKE 1 TABLET(10 MG) BY MOUTH DAILY WITH BREAKFAST 90 tablet 2  . HYDROcodone-acetaminophen (NORCO/VICODIN) 5-325 MG tablet Take 1 tablet by mouth every 6 (six) hours as needed for severe pain. 10 tablet 0  . metoprolol tartrate (LOPRESSOR) 100 MG tablet TAKE 1 TABLET(100 MG) BY MOUTH TWICE DAILY 180 tablet 1  . ondansetron (ZOFRAN) 4 MG tablet Take 1 tablet (4 mg total) by mouth every 8 (eight) hours as needed for nausea or vomiting. 10 tablet 0   No facility-administered medications prior to visit.     Allergies  Allergen Reactions  . Ace Inhibitors   . Contrast Media [Iodinated Diagnostic Agents]   . Nsaids     ROS Review of Systems  Constitutional: Negative for chills, diaphoresis, fatigue, fever and unexpected weight change.  HENT: Negative.   Eyes: Negative for photophobia and visual disturbance.  Respiratory: Negative.   Cardiovascular: Negative.   Endocrine: Negative for polyphagia and polyuria.  Genitourinary: Negative for difficulty urinating, frequency and urgency.  Musculoskeletal: Positive for gait problem and joint swelling.  Skin: Negative for pallor.  Allergic/Immunologic: Negative for immunocompromised state.  Neurological: Negative for light-headedness and headaches.  Hematological: Does not bruise/bleed easily.  Psychiatric/Behavioral: Negative.       Objective:    Physical Exam  Constitutional: She is oriented to person, place, and time. She appears well-developed and well-nourished. No distress.  HENT:  Head: Normocephalic and atraumatic.  Right Ear:  External ear normal.  Left Ear: External ear normal.  Eyes: Conjunctivae are normal. Right eye exhibits no discharge. Left eye exhibits no discharge. No scleral icterus.  Neck: Neck supple. No JVD present. No tracheal deviation present.  Pulmonary/Chest: Effort normal. No stridor.  Neurological: She is alert and oriented to person, place, and time.  Skin: Skin is warm and dry. She is not diaphoretic.  Psychiatric: She has a normal mood and affect. Her behavior is normal.    BP 124/80   Pulse 82   Ht 5\' 2"  (1.575 m)   Wt 166 lb (75.3 kg)   SpO2 95%  BMI 30.36 kg/m  Wt Readings from Last 3 Encounters:  04/19/18 166 lb (75.3 kg)  04/01/18 170 lb (77.1 kg)  11/03/17 162 lb (73.5 kg)   BP Readings from Last 3 Encounters:  04/19/18 124/80  04/01/18 (!) 163/90  01/25/18 140/90   Guideline developer:  UpToDate (see UpToDate for funding source) Date Released: June 2014  Health Maintenance Due  Topic Date Due  . FOOT EXAM  05/11/1939  . OPHTHALMOLOGY EXAM  05/11/1939  . TETANUS/TDAP  05/10/1948  . DEXA SCAN  05/11/1994    There are no preventive care reminders to display for this patient.  Lab Results  Component Value Date   TSH 3.26 03/02/2017   Lab Results  Component Value Date   WBC 8.7 04/01/2018   HGB 13.3 04/01/2018   HCT 46.0 04/01/2018   MCV 90.2 04/01/2018   PLT 105 (L) 04/01/2018   Lab Results  Component Value Date   NA 138 04/01/2018   K 3.6 04/01/2018   CO2 25 04/01/2018   GLUCOSE 173 (H) 04/01/2018   BUN 32 (H) 04/01/2018   CREATININE 1.12 (H) 04/01/2018   BILITOT 0.5 08/15/2017   ALKPHOS 62 08/15/2017   AST 20 08/15/2017   ALT 17 08/15/2017   PROT 7.7 08/15/2017   ALBUMIN 4.4 08/15/2017   CALCIUM 10.0 04/01/2018   ANIONGAP 6 04/01/2018   GFR 62.70 01/25/2018   Lab Results  Component Value Date   CHOL 192 01/25/2018   Lab Results  Component Value Date   HDL 43.00 01/25/2018   Lab Results  Component Value Date   LDLCALC 125 (H)  01/25/2018   Lab Results  Component Value Date   TRIG 122.0 01/25/2018   Lab Results  Component Value Date   CHOLHDL 4 01/25/2018   Lab Results  Component Value Date   HGBA1C 6.0 01/25/2018      Assessment & Plan:   Problem List Items Addressed This Visit      Cardiovascular and Mediastinum   A-fib ([place]) - Primary (Chronic)   Relevant Medications   amLODipine (NORVASC) 10 MG tablet   metoprolol tartrate (LOPRESSOR) 100 MG tablet   Other Relevant Orders   Basic metabolic panel   Hypertension   Relevant Medications   amLODipine (NORVASC) 10 MG tablet   metoprolol tartrate (LOPRESSOR) 100 MG tablet     Endocrine   DM2 (diabetes mellitus, type 2) (HCC) (Chronic)   Relevant Medications   glipiZIDE (GLUCOTROL XL) 10 MG 24 hr tablet   Other Relevant Orders   Basic metabolic panel   Hemoglobin A1c     Genitourinary   Stage 3 chronic kidney disease (HCC)   Relevant Orders   Basic metabolic panel   CBC     Other   Hyperlipidemia   Relevant Medications   amLODipine (NORVASC) 10 MG tablet   metoprolol tartrate (LOPRESSOR) 100 MG tablet   Other Relevant Orders   LDL cholesterol, direct   Lipid panel      Meds ordered this encounter  Medications  . amLODipine (NORVASC) 10 MG tablet    Sig: Take 1 tablet (10 mg total) by mouth daily.    Dispense:  90 tablet    Refill:  1  . glipiZIDE (GLUCOTROL XL) 10 MG 24 hr tablet    Sig: TAKE 1 TABLET(10 MG) BY MOUTH DAILY WITH BREAKFAST    Dispense:  90 tablet    Refill:  2  . metoprolol tartrate (LOPRESSOR) 100 MG tablet    Sig:  TAKE 1 TABLET(100 MG) BY MOUTH TWICE DAILY    Dispense:  180 tablet    Refill:  1    Follow-up: Return in about 3 months (around 07/20/2018).    Discussed that ice tea can be related to kidney stones and asked her to drink tea in moderation.

## 2018-07-19 ENCOUNTER — Encounter: Payer: Self-pay | Admitting: Family Medicine

## 2018-07-19 ENCOUNTER — Ambulatory Visit (INDEPENDENT_AMBULATORY_CARE_PROVIDER_SITE_OTHER): Payer: Medicare Other | Admitting: Family Medicine

## 2018-07-19 VITALS — BP 132/68 | Wt 174.0 lb

## 2018-07-19 DIAGNOSIS — I4811 Longstanding persistent atrial fibrillation: Secondary | ICD-10-CM

## 2018-07-19 DIAGNOSIS — E7800 Pure hypercholesterolemia, unspecified: Secondary | ICD-10-CM | POA: Diagnosis not present

## 2018-07-19 DIAGNOSIS — E119 Type 2 diabetes mellitus without complications: Secondary | ICD-10-CM | POA: Diagnosis not present

## 2018-07-19 DIAGNOSIS — N183 Chronic kidney disease, stage 3 unspecified: Secondary | ICD-10-CM

## 2018-07-19 DIAGNOSIS — I10 Essential (primary) hypertension: Secondary | ICD-10-CM

## 2018-07-19 MED ORDER — ATORVASTATIN CALCIUM 10 MG PO TABS: 10.0000 mg | ORAL_TABLET | Freq: Every day | ORAL | 3 refills | Status: DC

## 2018-07-19 MED ORDER — LOSARTAN POTASSIUM 100 MG PO TABS: 100.0000 mg | ORAL_TABLET | Freq: Every day | ORAL | 1 refills | Status: DC

## 2018-07-19 NOTE — Progress Notes (Addendum)
Established Patient Office Visit  Subjective:  Patient ID: Chelsea Dean, female    DOB: 1928-01-20  Age: 85 y.o. MRN: [id number]  CC:  Chief Complaint  Patient presents with  . Follow-up    HPI Natoria Archibald presents for follow-up of her atrial fib and hypertension.  Both are controlled with Norvasc and metoprolol continues with Coumadin therapy.  Diabetes remains well controlled with the glipizide.  LDL cholesterol is elevated and currently on treatment treated.  She is amenable to taking a statin.  Past Medical History:  Diagnosis Date  . A-fib ([place]) 10/19/2016  . Abdominal aortic aneurysm (AAA) without rupture ([place]) 09/15/2013  . Acute idiopathic gout of right foot   . AKI (acute kidney injury) ([place]) 10/18/2016  . Anticoagulated on Coumadin   . Arthritis   . Diabetes mellitus without complication ([place])   . Diverticular disease of large intestine 04/16/2013  . Diverticulosis   . DM2 (diabetes mellitus, type 2) ([place]) 10/19/2016  . Dysrhythmia    Atrial fib  . History of colon polyps 04/16/2013  . Hx of colonic polyps   . Hyperlipidemia   . Hypertension   . Long term current use of anticoagulant therapy 02/05/2015  . Lumbar radiculopathy 03/28/2014  . Microalbuminuria 04/16/2013  . Osteoarthritis 04/16/2013  . Osteoarthritis of cervical spine 03/28/2014  . Pelvic mass 10/19/2016  . Right renal mass   . SBO (small bowel obstruction) ([place]) 10/18/2016  . Thrombocytopenia ([place]) 04/16/2013   Overview:  referred to hematology 01/2104    Past Surgical History:  Procedure Laterality Date  . ABDOMINAL AORTIC ANEURYSM REPAIR    . ABDOMINAL HYSTERECTOMY    . IR GENERIC HISTORICAL  07/01/2015   IR RADIOLOGIST EVAL & MGMT 07/01/2015 [facility] INTERV RAD    Family History  Problem Relation Age of Onset  . Cancer Sister        Rectal and Stomach    Social History   Socioeconomic History  . Marital status: Married    Spouse name: Not on file  . Number of children: Not on file  . Years of  education: Not on file  . Highest education level: Not on file  Occupational History  . Occupation: retired [employer]  . Financial resource strain: Not on file  . Food insecurity:    Worry: Not on file    Inability: Not on file  . Transportation needs:    Medical: Not on file    Non-medical: Not on file  Tobacco Use  . Smoking status: Never Smoker  . Smokeless tobacco: Current User    Types: Snuff  Substance and Sexual Activity  . Alcohol use: No    Alcohol/week: 0.0 standard drinks  . Drug use: No  . Sexual activity: Not on file  Lifestyle  . Physical activity:    Days per week: Not on file    Minutes per session: Not on file  . Stress: Not on file  Relationships  . Social connections:    Talks on phone: Not on file    Gets together: Not on file    Attends religious service: Not on file    Active member of club or organization: Not on file    Attends meetings of clubs or organizations: Not on file    Relationship status: Not on file  . Intimate partner violence:    Fear of current or ex partner: Not on file    Emotionally abused: Not on file  Physically abused: Not on file    Forced sexual activity: Not on file  Other Topics Concern  . Not on file  Social History Narrative   Admitted to [facility] 10/27/16   Married   Use snuff   Alcohol none   DNR    Outpatient Medications Prior to Visit  Medication Sig Dispense Refill  . amLODipine (NORVASC) 10 MG tablet Take 1 tablet (10 mg total) by mouth daily. 90 tablet 1  . diclofenac sodium (VOLTAREN) 1 % GEL Apply 2 g topically daily as needed (KNEE PAIN).    Marland Kitchen diphenhydrAMINE (BENADRYL) 50 MG tablet Take 1 tablet (50 mg total) by mouth as directed for 1 dose. Take Benadryl 50 mg po 1 hr prior to CT (app't 09/05/2017) 1 tablet 0  . glipiZIDE (GLUCOTROL XL) 10 MG 24 hr tablet TAKE 1 TABLET(10 MG) BY MOUTH DAILY WITH BREAKFAST 90 tablet 2  . metoprolol tartrate (LOPRESSOR) 100 MG tablet  TAKE 1 TABLET(100 MG) BY MOUTH TWICE DAILY 180 tablet 1  . polyethylene glycol (MIRALAX / GLYCOLAX) packet Take 17 g by mouth daily. 14 each 0  . warfarin (COUMADIN) 2.5 MG tablet Take 2.5 mg by mouth daily. 2.5 mg daily except for Tuesday Thursday Saturday patient takes one half tablet (1.25 mg)    . losartan (COZAAR) 100 MG tablet Take 1 tablet (100 mg total) by mouth daily. 90 tablet 1   No facility-administered medications prior to visit.     Allergies  Allergen Reactions  . Ace Inhibitors   . Contrast Media [Iodinated Diagnostic Agents]   . Nsaids     ROS Review of Systems  Constitutional: Negative.   Respiratory: Negative.   Cardiovascular: Negative.   Gastrointestinal: Negative.   Endocrine: Negative for polyphagia and polyuria.      Objective:    Physical Exam  Constitutional: She is oriented to person, place, and time. She appears well-developed and well-nourished. No distress.  HENT:  Head: Normocephalic and atraumatic.  Right Ear: External ear normal.  Left Ear: External ear normal.  Eyes: Right eye exhibits no discharge. Left eye exhibits no discharge. No scleral icterus.  Pulmonary/Chest: Effort normal.  Neurological: She is alert and oriented to person, place, and time.  Skin: She is not diaphoretic.  Psychiatric: She has a normal mood and affect. Her behavior is normal.    BP 132/68   Wt 174 lb (78.9 kg)   BMI 31.83 kg/m  Wt Readings from Last 3 Encounters:  07/19/18 174 lb (78.9 kg)  04/19/18 166 lb (75.3 kg)  04/01/18 170 lb (77.1 kg)   BP Readings from Last 3 Encounters:  07/19/18 132/68  04/19/18 124/80  04/01/18 (!) 163/90   Guideline developer:  UpToDate (see UpToDate for funding source) Date Released: June 2014  Health Maintenance Due  Topic Date Due  . FOOT EXAM  05/11/1939  . OPHTHALMOLOGY EXAM  05/11/1939  . TETANUS/TDAP  05/10/1948  . DEXA SCAN  05/11/1994    There are no preventive care reminders to display for this patient.   Lab Results  Component Value Date   TSH 3.26 03/02/2017   Lab Results  Component Value Date   WBC 5.6 04/19/2018   HGB 13.2 04/19/2018   HCT 41.7 04/19/2018   MCV 84.9 04/19/2018   PLT 123.0 (L) 04/19/2018   Lab Results  Component Value Date   NA 143 04/19/2018   K 4.6 04/19/2018   CO2 28 04/19/2018   GLUCOSE 106 (H)  04/19/2018   BUN 26 (H) 04/19/2018   CREATININE 1.07 04/19/2018   BILITOT 0.5 08/15/2017   ALKPHOS 62 08/15/2017   AST 20 08/15/2017   ALT 17 08/15/2017   PROT 7.7 08/15/2017   ALBUMIN 4.4 08/15/2017   CALCIUM 10.2 04/19/2018   ANIONGAP 6 04/01/2018   GFR 48.29 (L) 04/19/2018   Lab Results  Component Value Date   CHOL 210 (H) 04/19/2018   Lab Results  Component Value Date   HDL 40.50 04/19/2018   Lab Results  Component Value Date   LDLCALC 143 (H) 04/19/2018   Lab Results  Component Value Date   TRIG 134.0 04/19/2018   Lab Results  Component Value Date   CHOLHDL 5 04/19/2018   Lab Results  Component Value Date   HGBA1C 6.3 04/19/2018      Assessment & Plan:   Problem List Items Addressed This Visit      Cardiovascular and Mediastinum   A-fib ([place]) - Primary (Chronic)   Relevant Medications   atorvastatin (LIPITOR) 10 MG tablet   losartan (COZAAR) 100 MG tablet   Hypertension   Relevant Medications   atorvastatin (LIPITOR) 10 MG tablet   losartan (COZAAR) 100 MG tablet   Other Relevant Orders   Basic metabolic panel   CBC   Urinalysis, Routine w reflex microscopic   Microalbumin / creatinine urine ratio     Endocrine   DM2 (diabetes mellitus, type 2) (HCC) (Chronic)   Relevant Medications   atorvastatin (LIPITOR) 10 MG tablet   losartan (COZAAR) 100 MG tablet   Other Relevant Orders   Basic metabolic panel   Hemoglobin A1c   Microalbumin / creatinine urine ratio     Genitourinary   Stage 3 chronic kidney disease (HCC)   Relevant Medications   losartan (COZAAR) 100 MG tablet   Other Relevant Orders   Basic  metabolic panel     Other   Elevated LDL cholesterol level   Relevant Medications   atorvastatin (LIPITOR) 10 MG tablet   Other Relevant Orders   LDL cholesterol, direct      Meds ordered this encounter  Medications  . atorvastatin (LIPITOR) 10 MG tablet    Sig: Take 1 tablet (10 mg total) by mouth daily.    Dispense:  90 tablet    Refill:  3  . losartan (COZAAR) 100 MG tablet    Sig: Take 1 tablet (100 mg total) by mouth daily.    Dispense:  90 tablet    Refill:  1    Follow-up: Return in about 3 months (around 10/19/2018).   We will start atorvastatin at low dose.  Follow-up in 3 months for recheck.  Virtual Visit via Video Note  I connected with Barby Colvard on 08/07/18 at 11:00 AM EDT by a video enabled telemedicine application and verified that I am speaking with the correct person using two identifiers.  Location: Patient: home Provider:   I discussed the limitations of evaluation and management by telemedicine and the availability of in person appointments. The patient expressed understanding and agreed to proceed.  History of Present Illness:    Observations/Objective:   Assessment and Plan:   Follow Up Instructions:    I discussed the assessment and treatment plan with the patient. The patient was provided an opportunity to ask questions and all were answered. The patient agreed with the plan and demonstrated an understanding of the instructions.   The patient was advised to call back or seek an in-person evaluation  if the symptoms worsen or if the condition fails to improve as anticipated.  I provided 20 minutes of non-face-to-face time during this encounter.   [name], MD

## 2018-07-20 ENCOUNTER — Ambulatory Visit: Payer: Medicare Other | Admitting: Family Medicine

## 2018-08-10 ENCOUNTER — Telehealth: Payer: Self-pay

## 2018-08-10 NOTE — Telephone Encounter (Signed)
During this illness, did/does the patient experience any of the following symptoms? Fever >100.37F []   Yes [x]   No []   Unknown Subjective fever (felt feverish) []   Yes [x]   No []   Unknown Chills []   Yes [x]   No []   Unknown Muscle aches (myalgia) []   Yes [x]   No []   Unknown Runny nose (rhinorrhea) []   Yes [x]   No []   Unknown Sore throat []   Yes [x]   No []   Unknown Cough (new onset or worsening of chronic cough) []   Yes [x]   No []   Unknown Shortness of breath (dyspnea) []   Yes []   No []   Unknown Nausea or vomiting []   Yes [x]   No []   Unknown Headache []   Yes [x]   No []   Unknown Abdominal pain  []   Yes [x]   No []   Unknown Diarrhea (?3 loose/looser than normal stools/24hr period) []   Yes [x]   No []   Unknown Other, specify:

## 2018-08-11 ENCOUNTER — Other Ambulatory Visit (INDEPENDENT_AMBULATORY_CARE_PROVIDER_SITE_OTHER): Payer: Medicare Other

## 2018-08-11 DIAGNOSIS — I10 Essential (primary) hypertension: Secondary | ICD-10-CM

## 2018-08-11 DIAGNOSIS — E119 Type 2 diabetes mellitus without complications: Secondary | ICD-10-CM

## 2018-08-11 DIAGNOSIS — E7800 Pure hypercholesterolemia, unspecified: Secondary | ICD-10-CM | POA: Diagnosis not present

## 2018-08-11 DIAGNOSIS — N183 Chronic kidney disease, stage 3 unspecified: Secondary | ICD-10-CM

## 2018-08-11 LAB — URINALYSIS, ROUTINE W REFLEX MICROSCOPIC
Bilirubin Urine: NEGATIVE
Hgb urine dipstick: NEGATIVE
Ketones, ur: NEGATIVE
Leukocytes,Ua: NEGATIVE
Nitrite: NEGATIVE
Specific Gravity, Urine: 1.025 (ref 1.000–1.030)
Total Protein, Urine: 30 — AB
Urine Glucose: NEGATIVE
Urobilinogen, UA: 0.2 (ref 0.0–1.0)
pH: 5.5 (ref 5.0–8.0)

## 2018-08-11 LAB — BASIC METABOLIC PANEL
BUN: 27 mg/dL — ABNORMAL HIGH (ref 6–23)
CO2: 27 mEq/L (ref 19–32)
Calcium: 11.2 mg/dL — ABNORMAL HIGH (ref 8.4–10.5)
Chloride: 109 mEq/L (ref 96–112)
Creatinine, Ser: 1.19 mg/dL (ref 0.40–1.20)
GFR: 42.69 mL/min — ABNORMAL LOW (ref >60.00)
Glucose, Bld: 110 mg/dL — ABNORMAL HIGH (ref 70–99)
Potassium: 4.9 mEq/L (ref 3.5–5.1)
Sodium: 144 mEq/L (ref 135–145)

## 2018-08-11 LAB — CBC
HCT: 42 % (ref 36.0–46.0)
Hemoglobin: 13.1 g/dL (ref 12.0–15.0)
MCHC: 31 g/dL (ref 30.0–36.0)
MCV: 84.6 fl (ref 78.0–100.0)
Platelets: 104 10*3/uL — ABNORMAL LOW (ref 150.0–400.0)
RBC: 5 Mil/uL (ref 3.87–5.11)
RDW: 15.5 % (ref 11.5–15.5)
WBC: 5.9 10*3/uL (ref 4.0–10.5)

## 2018-08-11 LAB — MICROALBUMIN / CREATININE URINE RATIO
Creatinine,U: 105.3 mg/dL
Microalb Creat Ratio: 36.1 mg/g — ABNORMAL HIGH (ref 0.0–30.0)
Microalb, Ur: 38 mg/dL — ABNORMAL HIGH (ref 0.0–1.9)

## 2018-08-11 LAB — HEMOGLOBIN A1C: Hgb A1c MFr Bld: 6.4 % (ref 4.6–6.5)

## 2018-08-11 LAB — LDL CHOLESTEROL, DIRECT: Direct LDL: 96 mg/dL

## 2018-11-22 DIAGNOSIS — R791 Abnormal coagulation profile: Secondary | ICD-10-CM | POA: Diagnosis not present

## 2018-11-22 DIAGNOSIS — Z5181 Encounter for therapeutic drug level monitoring: Secondary | ICD-10-CM | POA: Diagnosis not present

## 2018-11-22 DIAGNOSIS — Z7901 Long term (current) use of anticoagulants: Secondary | ICD-10-CM | POA: Diagnosis not present

## 2018-11-22 DIAGNOSIS — I4891 Unspecified atrial fibrillation: Secondary | ICD-10-CM | POA: Diagnosis not present

## 2018-12-05 ENCOUNTER — Ambulatory Visit (INDEPENDENT_AMBULATORY_CARE_PROVIDER_SITE_OTHER): Payer: Medicare Other

## 2018-12-05 ENCOUNTER — Other Ambulatory Visit: Payer: Self-pay

## 2018-12-05 DIAGNOSIS — Z23 Encounter for immunization: Secondary | ICD-10-CM

## 2018-12-13 DIAGNOSIS — Z5181 Encounter for therapeutic drug level monitoring: Secondary | ICD-10-CM | POA: Diagnosis not present

## 2018-12-13 DIAGNOSIS — R791 Abnormal coagulation profile: Secondary | ICD-10-CM | POA: Diagnosis not present

## 2018-12-13 DIAGNOSIS — I4891 Unspecified atrial fibrillation: Secondary | ICD-10-CM | POA: Diagnosis not present

## 2018-12-13 DIAGNOSIS — Z7901 Long term (current) use of anticoagulants: Secondary | ICD-10-CM | POA: Diagnosis not present

## 2018-12-28 ENCOUNTER — Other Ambulatory Visit: Payer: Self-pay | Admitting: Family Medicine

## 2018-12-28 DIAGNOSIS — I482 Chronic atrial fibrillation, unspecified: Secondary | ICD-10-CM

## 2018-12-28 DIAGNOSIS — I10 Essential (primary) hypertension: Secondary | ICD-10-CM

## 2019-01-08 ENCOUNTER — Other Ambulatory Visit: Payer: Self-pay

## 2019-01-08 NOTE — Patient Outreach (Signed)
[facility]) Care Management  01/08/2019  Chelsea Dean [birth year] [id number]   Medication Adherence call to Chelsea Dean  [place] Identifiers Verify spoke with patient daughter patient is past due on Glipizide Er 10 mg,daughter explain she take 1 tablet daily patient has medication at this time and takes it on a regular basis.Chelsea Dean is showing past due under Chelsea Dean.   [facility] Management Direct Dial [phone]  Fax [phone] [email or web address]

## 2019-01-09 DIAGNOSIS — Z7901 Long term (current) use of anticoagulants: Secondary | ICD-10-CM | POA: Diagnosis not present

## 2019-01-09 DIAGNOSIS — I4891 Unspecified atrial fibrillation: Secondary | ICD-10-CM | POA: Diagnosis not present

## 2019-01-09 DIAGNOSIS — Z5181 Encounter for therapeutic drug level monitoring: Secondary | ICD-10-CM | POA: Diagnosis not present

## 2019-01-25 ENCOUNTER — Other Ambulatory Visit: Payer: Self-pay

## 2019-01-25 NOTE — Patient Outreach (Signed)
[facility]) Care Management  01/25/2019  Chelsea Dean [birth year] [id number]   Medication Adherence call to [name] Telephone call to Patient regarding Medication Adherence unable to reach patient. [name] is showing past due on Losartan 100 mg under [place].   [facility] Management Direct Dial [phone]  Fax [phone] [email or web address]

## 2019-01-29 ENCOUNTER — Other Ambulatory Visit: Payer: Self-pay | Admitting: Family Medicine

## 2019-01-29 DIAGNOSIS — E119 Type 2 diabetes mellitus without complications: Secondary | ICD-10-CM

## 2019-01-29 DIAGNOSIS — I10 Essential (primary) hypertension: Secondary | ICD-10-CM

## 2019-01-29 DIAGNOSIS — N183 Chronic kidney disease, stage 3 unspecified: Secondary | ICD-10-CM

## 2019-02-01 ENCOUNTER — Other Ambulatory Visit: Payer: Self-pay

## 2019-02-01 NOTE — Patient Outreach (Signed)
[facility]) Care Management  02/01/2019  Arie Gable 05/11/1927 [id number]   Medication Adherence call to [name] ,patient did not answer, Mrs. Nilsen is showing past due on Losartan 100 mg under [place]. Patient has already pick up Losartan 100 mg on 01/29/19 for a 90 days supply.  [facility] Management Direct Dial [phone]  Fax [phone] [email or web address]

## 2019-02-05 DIAGNOSIS — Z7901 Long term (current) use of anticoagulants: Secondary | ICD-10-CM | POA: Diagnosis not present

## 2019-02-05 DIAGNOSIS — Z5181 Encounter for therapeutic drug level monitoring: Secondary | ICD-10-CM | POA: Diagnosis not present

## 2019-02-05 DIAGNOSIS — I4891 Unspecified atrial fibrillation: Secondary | ICD-10-CM | POA: Diagnosis not present

## 2019-03-06 DIAGNOSIS — I4891 Unspecified atrial fibrillation: Secondary | ICD-10-CM | POA: Diagnosis not present

## 2019-03-06 DIAGNOSIS — R791 Abnormal coagulation profile: Secondary | ICD-10-CM | POA: Diagnosis not present

## 2019-03-06 DIAGNOSIS — Z7901 Long term (current) use of anticoagulants: Secondary | ICD-10-CM | POA: Diagnosis not present

## 2019-03-06 DIAGNOSIS — Z5181 Encounter for therapeutic drug level monitoring: Secondary | ICD-10-CM | POA: Diagnosis not present

## 2019-03-08 DIAGNOSIS — H903 Sensorineural hearing loss, bilateral: Secondary | ICD-10-CM | POA: Diagnosis not present

## 2019-03-14 DIAGNOSIS — I4891 Unspecified atrial fibrillation: Secondary | ICD-10-CM | POA: Diagnosis not present

## 2019-03-14 DIAGNOSIS — Z7901 Long term (current) use of anticoagulants: Secondary | ICD-10-CM | POA: Diagnosis not present

## 2019-03-14 DIAGNOSIS — Z5181 Encounter for therapeutic drug level monitoring: Secondary | ICD-10-CM | POA: Diagnosis not present

## 2019-03-26 ENCOUNTER — Encounter: Payer: Self-pay | Admitting: Family Medicine

## 2019-03-26 ENCOUNTER — Other Ambulatory Visit: Payer: Self-pay

## 2019-03-26 ENCOUNTER — Ambulatory Visit (INDEPENDENT_AMBULATORY_CARE_PROVIDER_SITE_OTHER): Payer: Medicare Other | Admitting: Family Medicine

## 2019-03-26 VITALS — BP 122/80 | HR 75 | Temp 96.8°F | Ht 62.0 in | Wt 172.0 lb

## 2019-03-26 DIAGNOSIS — E119 Type 2 diabetes mellitus without complications: Secondary | ICD-10-CM | POA: Diagnosis not present

## 2019-03-26 DIAGNOSIS — I10 Essential (primary) hypertension: Secondary | ICD-10-CM | POA: Diagnosis not present

## 2019-03-26 DIAGNOSIS — D7589 Other specified diseases of blood and blood-forming organs: Secondary | ICD-10-CM

## 2019-03-26 DIAGNOSIS — D696 Thrombocytopenia, unspecified: Secondary | ICD-10-CM | POA: Diagnosis not present

## 2019-03-26 DIAGNOSIS — N184 Chronic kidney disease, stage 4 (severe): Secondary | ICD-10-CM

## 2019-03-26 DIAGNOSIS — E7800 Pure hypercholesterolemia, unspecified: Secondary | ICD-10-CM

## 2019-03-26 DIAGNOSIS — N183 Chronic kidney disease, stage 3 unspecified: Secondary | ICD-10-CM

## 2019-03-26 DIAGNOSIS — I482 Chronic atrial fibrillation, unspecified: Secondary | ICD-10-CM

## 2019-03-26 LAB — MICROALBUMIN / CREATININE URINE RATIO
Creatinine,U: 74.7 mg/dL
Microalb Creat Ratio: 49.6 mg/g — ABNORMAL HIGH (ref 0.0–30.0)
Microalb, Ur: 37.1 mg/dL — ABNORMAL HIGH (ref 0.0–1.9)

## 2019-03-26 LAB — URINALYSIS, ROUTINE W REFLEX MICROSCOPIC
Bilirubin Urine: NEGATIVE
Ketones, ur: NEGATIVE
Leukocytes,Ua: NEGATIVE
Nitrite: NEGATIVE
Specific Gravity, Urine: 1.025 (ref 1.000–1.030)
Total Protein, Urine: 30 — AB
Urine Glucose: NEGATIVE
Urobilinogen, UA: 0.2 (ref 0.0–1.0)
pH: 6 (ref 5.0–8.0)

## 2019-03-26 LAB — BASIC METABOLIC PANEL
BUN: 21 mg/dL (ref 6–23)
CO2: 29 mEq/L (ref 19–32)
Calcium: 10.8 mg/dL — ABNORMAL HIGH (ref 8.4–10.5)
Chloride: 102 mEq/L (ref 96–112)
Creatinine, Ser: 1.04 mg/dL (ref 0.40–1.20)
GFR: 49.8 mL/min — ABNORMAL LOW (ref >60.00)
Glucose, Bld: 108 mg/dL — ABNORMAL HIGH (ref 70–99)
Potassium: 4.6 mEq/L (ref 3.5–5.1)
Sodium: 139 mEq/L (ref 135–145)

## 2019-03-26 LAB — LDL CHOLESTEROL, DIRECT: Direct LDL: 98 mg/dL

## 2019-03-26 LAB — CBC
HCT: 44.8 % (ref 36.0–46.0)
Hemoglobin: 14 g/dL (ref 12.0–15.0)
MCHC: 31.1 g/dL (ref 30.0–36.0)
MCV: 82.1 fl (ref 78.0–100.0)
Platelets: 104 10*3/uL — ABNORMAL LOW (ref 150.0–400.0)
RBC: 5.46 Mil/uL — ABNORMAL HIGH (ref 3.87–5.11)
RDW: 15.3 % (ref 11.5–15.5)
WBC: 5.3 10*3/uL (ref 4.0–10.5)

## 2019-03-26 LAB — HEMOGLOBIN A1C: Hgb A1c MFr Bld: 6.6 % — ABNORMAL HIGH (ref 4.6–6.5)

## 2019-03-26 LAB — VITAMIN B12: Vitamin B-12: 333 pg/mL (ref 211–911)

## 2019-03-26 MED ORDER — METOPROLOL TARTRATE 100 MG PO TABS: ORAL_TABLET | ORAL | 1 refills | Status: DC

## 2019-03-26 MED ORDER — LOSARTAN POTASSIUM 100 MG PO TABS: 100.0000 mg | ORAL_TABLET | Freq: Every day | ORAL | 1 refills | Status: DC

## 2019-03-26 MED ORDER — AMLODIPINE BESYLATE 10 MG PO TABS: ORAL_TABLET | ORAL | 1 refills | Status: DC

## 2019-03-26 MED ORDER — GLIPIZIDE ER 10 MG PO TB24: ORAL_TABLET | ORAL | 2 refills | Status: DC

## 2019-03-26 MED ORDER — ATORVASTATIN CALCIUM 10 MG PO TABS: 10.0000 mg | ORAL_TABLET | Freq: Every day | ORAL | 3 refills | Status: DC

## 2019-03-26 NOTE — Progress Notes (Signed)
Established Patient Office Visit  Subjective:  Patient ID: Chelsea Dean, female    DOB: 05/18/1927  Age: 86 y.o. MRN: [id number]  CC:  Chief Complaint  Patient presents with  . Annual Exam    CPE, no concerns need refills on medications.     HPI Chelsea Dean presents for follow-up of her diabetes, hypertension and elevated LDL cholesterol.  Continues all medications as directed.  Denies any current issues taking them.  Her daughter is now retired and is involved in her ongoing health care.  Continues on Coumadin for her chronic atrial fibrillation.  Denies seeing blood in her stool or urine.  Past Medical History:  Diagnosis Date  . A-fib ([place]) 10/19/2016  . Abdominal aortic aneurysm (AAA) without rupture ([place]) 09/15/2013  . Acute idiopathic gout of right foot   . AKI (acute kidney injury) ([place]) 10/18/2016  . Anticoagulated on Coumadin   . Arthritis   . Diabetes mellitus without complication ([place])   . Diverticular disease of large intestine 04/16/2013  . Diverticulosis   . DM2 (diabetes mellitus, type 2) ([place]) 10/19/2016  . Dysrhythmia    Atrial fib  . History of colon polyps 04/16/2013  . Hx of colonic polyps   . Hyperlipidemia   . Hypertension   . Long term current use of anticoagulant therapy 02/05/2015  . Lumbar radiculopathy 03/28/2014  . Microalbuminuria 04/16/2013  . Osteoarthritis 04/16/2013  . Osteoarthritis of cervical spine 03/28/2014  . Pelvic mass 10/19/2016  . Right renal mass   . SBO (small bowel obstruction) ([place]) 10/18/2016  . Thrombocytopenia ([place]) 04/16/2013   Overview:  referred to hematology 01/2104    Past Surgical History:  Procedure Laterality Date  . ABDOMINAL AORTIC ANEURYSM REPAIR    . ABDOMINAL HYSTERECTOMY    . IR GENERIC HISTORICAL  07/01/2015   IR RADIOLOGIST EVAL & MGMT 07/01/2015 [facility] INTERV RAD    Family History  Problem Relation Age of Onset  . Cancer Sister        Rectal and Stomach    Social History   Socioeconomic History  . Marital  status: Married    Spouse name: Not on file  . Number of children: Not on file  . Years of education: Not on file  . Highest education level: Not on file  Occupational History  . Occupation: retired [employer]  . Smoking status: Never Smoker  . Smokeless tobacco: Current User    Types: Snuff  Substance and Sexual Activity  . Alcohol use: No    Alcohol/week: 0.0 standard drinks  . Drug use: No  . Sexual activity: Not on file  Other Topics Concern  . Not on file  Social History Narrative   Admitted to [facility] 10/27/16   Married   Use snuff   Alcohol none   DNR   Social Determinants of Health   Financial Resource Strain:   . Difficulty of Paying Living Expenses: Not on file  Food Insecurity:   . Worried About Charity fundraiser in the Last Year: Not on file  . Ran Out of Food in the Last Year: Not on file  Transportation Needs:   . Lack of Transportation (Medical): Not on file  . Lack of Transportation (Non-Medical): Not on file  Physical Activity:   . Days of Exercise per Week: Not on file  . Minutes of Exercise per Session: Not on file  Stress:   . Feeling of Stress : Not on  file  Social Connections:   . Frequency of Communication with Friends and Family: Not on file  . Frequency of Social Gatherings with Friends and Family: Not on file  . Attends Religious Services: Not on file  . Active Member of Clubs or Organizations: Not on file  . Attends Archivist Meetings: Not on file  . Marital Status: Not on file  Intimate Partner Violence:   . Fear of Current or Ex-Partner: Not on file  . Emotionally Abused: Not on file  . Physically Abused: Not on file  . Sexually Abused: Not on file    Outpatient Medications Prior to Visit  Medication Sig Dispense Refill  . diclofenac sodium (VOLTAREN) 1 % GEL Apply 2 g topically daily as needed (KNEE PAIN).    Marland Kitchen diphenhydrAMINE (BENADRYL) 50 MG tablet Take 1 tablet (50 mg total) by mouth  as directed for 1 dose. Take Benadryl 50 mg po 1 hr prior to CT (app't 09/05/2017) 1 tablet 0  . warfarin (COUMADIN) 2.5 MG tablet Take 2.5 mg by mouth daily. 2.5 mg daily except for Tuesday Thursday Saturday patient takes one half tablet (1.25 mg)    . amLODipine (NORVASC) 10 MG tablet TAKE ONE TABLET BY MOUTH EACH DAY FOR BLOOD PRESSURE 90 tablet 1  . atorvastatin (LIPITOR) 10 MG tablet Take 1 tablet (10 mg total) by mouth daily. 90 tablet 3  . glipiZIDE (GLUCOTROL XL) 10 MG 24 hr tablet TAKE 1 TABLET(10 MG) BY MOUTH DAILY WITH BREAKFAST 90 tablet 2  . losartan (COZAAR) 100 MG tablet TAKE 1 TABLET BY MOUTH DAILY 60 tablet 0  . metoprolol tartrate (LOPRESSOR) 100 MG tablet TAKE ONE TABLET BY MOUTH TWICE A DAY 180 tablet 1  . polyethylene glycol (MIRALAX / GLYCOLAX) packet Take 17 g by mouth daily. (Patient not taking: Reported on 03/26/2019) 14 each 0   No facility-administered medications prior to visit.    Allergies  Allergen Reactions  . Ace Inhibitors   . Contrast Media [Iodinated Diagnostic Agents]   . Nsaids     ROS Review of Systems  Constitutional: Negative.   HENT: Negative.   Eyes: Negative for photophobia and visual disturbance.  Respiratory: Negative.   Cardiovascular: Negative.   Gastrointestinal: Negative.   Endocrine: Negative for polyphagia and polyuria.  Genitourinary: Negative.   Musculoskeletal: Negative.   Allergic/Immunologic: Negative for immunocompromised state.  Neurological: Negative for light-headedness and headaches.  Hematological: Does not bruise/bleed easily.  Psychiatric/Behavioral: Negative.        Depression screen [facility] 2/9 03/26/2019  Decreased Interest 0  Down, Depressed, Hopeless 0  PHQ - 2 Score 0  Altered sleeping 0  Tired, decreased energy 0  Change in appetite 0  Feeling bad or failure about yourself  0  Trouble concentrating 0  Moving slowly or fidgety/restless 0  Suicidal thoughts 0  PHQ-9 Score 0    Objective:    Physical Exam   Constitutional: She is oriented to person, place, and time. She appears well-developed and well-nourished. No distress.  HENT:  Head: Normocephalic and atraumatic.  Right Ear: External ear normal.  Left Ear: External ear normal.  Eyes: Conjunctivae are normal. Right eye exhibits no discharge. Left eye exhibits no discharge. No scleral icterus.  Neck: No JVD present. No tracheal deviation present.  Cardiovascular: An irregularly irregular rhythm present.  Pulmonary/Chest: Effort normal and breath sounds normal. No stridor.  Musculoskeletal:        General: No edema.  Neurological: She is alert and oriented  to person, place, and time.  Skin: Skin is warm and dry. She is not diaphoretic.  Psychiatric: She has a normal mood and affect. Her behavior is normal.    BP 122/80   Pulse 75   Temp (!) 96.8 F (36 C) (Tympanic)   Ht 5\' 2"  (1.575 m)   Wt 172 lb (78 kg)   SpO2 98%   BMI 31.46 kg/m  Wt Readings from Last 3 Encounters:  03/26/19 172 lb (78 kg)  07/19/18 174 lb (78.9 kg)  04/19/18 166 lb (75.3 kg)     Health Maintenance Due  Topic Date Due  . FOOT EXAM  05/11/1939  . OPHTHALMOLOGY EXAM  05/11/1939  . TETANUS/TDAP  05/10/1948  . DEXA SCAN  05/11/1994  . HEMOGLOBIN A1C  02/10/2019    There are no preventive care reminders to display for this patient.  Lab Results  Component Value Date   TSH 3.26 03/02/2017   Lab Results  Component Value Date   WBC 5.9 08/11/2018   HGB 13.1 08/11/2018   HCT 42.0 08/11/2018   MCV 84.6 08/11/2018   PLT 104.0 (L) 08/11/2018   Lab Results  Component Value Date   NA 144 08/11/2018   K 4.9 08/11/2018   CO2 27 08/11/2018   GLUCOSE 110 (H) 08/11/2018   BUN 27 (H) 08/11/2018   CREATININE 1.19 08/11/2018   BILITOT 0.5 08/15/2017   ALKPHOS 62 08/15/2017   AST 20 08/15/2017   ALT 17 08/15/2017   PROT 7.7 08/15/2017   ALBUMIN 4.4 08/15/2017   CALCIUM 11.2 (H) 08/11/2018   ANIONGAP 6 04/01/2018   GFR 42.69 (L) 08/11/2018   Lab  Results  Component Value Date   CHOL 210 (H) 04/19/2018   Lab Results  Component Value Date   HDL 40.50 04/19/2018   Lab Results  Component Value Date   LDLCALC 143 (H) 04/19/2018   Lab Results  Component Value Date   TRIG 134.0 04/19/2018   Lab Results  Component Value Date   CHOLHDL 5 04/19/2018   Lab Results  Component Value Date   HGBA1C 6.4 08/11/2018      Assessment & Plan:   Problem List Items Addressed This Visit      Cardiovascular and Mediastinum   A-fib (HCC) (Chronic)   Relevant Medications   amLODipine (NORVASC) 10 MG tablet   atorvastatin (LIPITOR) 10 MG tablet   losartan (COZAAR) 100 MG tablet   metoprolol tartrate (LOPRESSOR) 100 MG tablet   Hypertension   Relevant Medications   amLODipine (NORVASC) 10 MG tablet   atorvastatin (LIPITOR) 10 MG tablet   losartan (COZAAR) 100 MG tablet   metoprolol tartrate (LOPRESSOR) 100 MG tablet   Other Relevant Orders   Basic metabolic panel   Urinalysis, Routine w reflex microscopic   Microalbumin / creatinine urine ratio     Endocrine   DM2 (diabetes mellitus, type 2) (HCC) - Primary (Chronic)   Relevant Medications   atorvastatin (LIPITOR) 10 MG tablet   losartan (COZAAR) 100 MG tablet   glipiZIDE (GLUCOTROL XL) 10 MG 24 hr tablet   Other Relevant Orders   Basic metabolic panel   Hemoglobin A1c   Urinalysis, Routine w reflex microscopic   Microalbumin / creatinine urine ratio     Genitourinary   Stage 3 chronic kidney disease   Relevant Medications   losartan (COZAAR) 100 MG tablet     Other   Thrombocytopenia (HCC)   Relevant Orders   CBC   Elevated LDL  cholesterol level   Relevant Medications   atorvastatin (LIPITOR) 10 MG tablet   Other Relevant Orders   LDL cholesterol, direct    Other Visit Diagnoses    Stage 4 chronic kidney disease ([place])       Relevant Orders   Basic metabolic panel   Macrocytosis       Relevant Orders   Vitamin B12      Meds ordered this encounter    Medications  . amLODipine (NORVASC) 10 MG tablet    Sig: TAKE ONE TABLET BY MOUTH EACH DAY FOR BLOOD PRESSURE    Dispense:  90 tablet    Refill:  1  . atorvastatin (LIPITOR) 10 MG tablet    Sig: Take 1 tablet (10 mg total) by mouth daily.    Dispense:  90 tablet    Refill:  3  . losartan (COZAAR) 100 MG tablet    Sig: Take 1 tablet (100 mg total) by mouth daily.    Dispense:  90 tablet    Refill:  1    Please inform pt that an office visit is needed for refills  . glipiZIDE (GLUCOTROL XL) 10 MG 24 hr tablet    Sig: TAKE 1 TABLET(10 MG) BY MOUTH DAILY WITH BREAKFAST    Dispense:  90 tablet    Refill:  2  . metoprolol tartrate (LOPRESSOR) 100 MG tablet    Sig: TAKE ONE TABLET BY MOUTH TWICE A DAY    Dispense:  180 tablet    Refill:  1    Follow-up: Return in about 6 months (around 09/23/2019), or if symptoms worsen or fail to improve.    [name], MD

## 2019-03-29 DIAGNOSIS — Z5181 Encounter for therapeutic drug level monitoring: Secondary | ICD-10-CM | POA: Diagnosis not present

## 2019-03-29 DIAGNOSIS — Z7901 Long term (current) use of anticoagulants: Secondary | ICD-10-CM | POA: Diagnosis not present

## 2019-03-29 DIAGNOSIS — I4891 Unspecified atrial fibrillation: Secondary | ICD-10-CM | POA: Diagnosis not present

## 2019-04-25 DIAGNOSIS — I4891 Unspecified atrial fibrillation: Secondary | ICD-10-CM | POA: Diagnosis not present

## 2019-04-25 DIAGNOSIS — Z7901 Long term (current) use of anticoagulants: Secondary | ICD-10-CM | POA: Diagnosis not present

## 2019-04-25 DIAGNOSIS — Z5181 Encounter for therapeutic drug level monitoring: Secondary | ICD-10-CM | POA: Diagnosis not present

## 2019-05-08 NOTE — Progress Notes (Signed)
Nurse connected with patient 05/09/19 at  2:30 PM EDT by a telephone enabled telemedicine application and verified that I am speaking with the correct person using two identifiers. Patient stated full name and DOB. Patient gave permission to continue with virtual visit. Patient's location was at home and Nurse's location was at [place] office.  Pt is HOH so daughter is w/pt on the call over speaker phone. Okay per pt.   Subjective:   Chelsea Dean is a 86 y.o. female who presents for an Initial Medicare Annual Wellness Visit.  Review of Systems    Cardiac Risk Factors include: advanced age (>17men, >39 women);diabetes mellitus;dyslipidemia;hypertension Home Safety/Smoke Alarms: Feels safe in home. Smoke alarms in place.  Lives alone in 1 story home. Uses cane. Step over tub. Grab bar and bench. Dtr. Lives next door and comes over 3-4x/ day.   Female:       Mammo-  Declines. No longer doing routine screening due to age.      Dexa scan-  Declines. No longer doing routine screening due to age.           Objective:      Advanced Directives 05/09/2019 04/01/2018 03/29/2017 03/20/2017 11/01/2016 10/29/2016 10/19/2016  Does Patient Have a Medical Advance Directive? No No No No Yes Yes No  Type of Advance Directive - - - - Out of facility DNR (pink MOST or yellow form) Out of facility DNR (pink MOST or yellow form) -  Would patient like information on creating a medical advance directive? No - Patient declined - No - Patient declined - - - No - Patient declined  Pre-existing out of facility DNR order (yellow form or pink MOST form) - - - - Yellow form placed in chart (order not valid for inpatient use) Yellow form placed in chart (order not valid for inpatient use) -    Current Medications (verified) Outpatient Encounter Medications as of 05/09/2019  Medication Sig  . amLODipine (NORVASC) 10 MG tablet TAKE ONE TABLET BY MOUTH EACH DAY FOR BLOOD PRESSURE  . atorvastatin (LIPITOR) 10 MG tablet  Take 1 tablet (10 mg total) by mouth daily.  . diclofenac sodium (VOLTAREN) 1 % GEL Apply 2 g topically daily as needed (KNEE PAIN).  Marland Kitchen diphenhydrAMINE (BENADRYL) 50 MG tablet Take 1 tablet (50 mg total) by mouth as directed for 1 dose. Take Benadryl 50 mg po 1 hr prior to CT (app't 09/05/2017)  . glipiZIDE (GLUCOTROL XL) 10 MG 24 hr tablet TAKE 1 TABLET(10 MG) BY MOUTH DAILY WITH BREAKFAST  . losartan (COZAAR) 100 MG tablet Take 1 tablet (100 mg total) by mouth daily.  . metoprolol tartrate (LOPRESSOR) 100 MG tablet TAKE ONE TABLET BY MOUTH TWICE A DAY  . polyethylene glycol (MIRALAX / GLYCOLAX) packet Take 17 g by mouth daily.  Marland Kitchen warfarin (COUMADIN) 2.5 MG tablet Take 2.5 mg by mouth daily. 2.5 mg daily except for Tuesday Thursday Saturday patient takes one half tablet (1.25 mg)   No facility-administered encounter medications on file as of 05/09/2019.    Allergies (verified) Ace inhibitors, Contrast media [iodinated diagnostic agents], and Nsaids   History: Past Medical History:  Diagnosis Date  . A-fib ([place]) 10/19/2016  . Abdominal aortic aneurysm (AAA) without rupture ([place]) 09/15/2013  . Acute idiopathic gout of right foot   . AKI (acute kidney injury) ([place]) 10/18/2016  . Anticoagulated on Coumadin   . Arthritis   . Diabetes mellitus without complication ([place])   . Diverticular disease of large  intestine 04/16/2013  . Diverticulosis   . DM2 (diabetes mellitus, type 2) ([place]) 10/19/2016  . Dysrhythmia    Atrial fib  . History of colon polyps 04/16/2013  . Hx of colonic polyps   . Hyperlipidemia   . Hypertension   . Long term current use of anticoagulant therapy 02/05/2015  . Lumbar radiculopathy 03/28/2014  . Microalbuminuria 04/16/2013  . Osteoarthritis 04/16/2013  . Osteoarthritis of cervical spine 03/28/2014  . Pelvic mass 10/19/2016  . Right renal mass   . SBO (small bowel obstruction) ([place]) 10/18/2016  . Thrombocytopenia ([place]) 04/16/2013   Overview:  referred to hematology 01/2104   Past  Surgical History:  Procedure Laterality Date  . ABDOMINAL AORTIC ANEURYSM REPAIR    . ABDOMINAL HYSTERECTOMY    . IR GENERIC HISTORICAL  07/01/2015   IR RADIOLOGIST EVAL & MGMT 07/01/2015 [facility] INTERV RAD   Family History  Problem Relation Age of Onset  . Cancer Sister        Rectal and Stomach   Social History   Socioeconomic History  . Marital status: Married    Spouse name: Not on file  . Number of children: Not on file  . Years of education: Not on file  . Highest education level: Not on file  Occupational History  . Occupation: retired [employer]  . Smoking status: Never Smoker  . Smokeless tobacco: Current User    Types: Snuff  Substance and Sexual Activity  . Alcohol use: No    Alcohol/week: 0.0 standard drinks  . Drug use: No  . Sexual activity: Not on file  Other Topics Concern  . Not on file  Social History Narrative   Admitted to [facility] 10/27/16   Married   Use snuff   Alcohol none   DNR   Social Determinants of Health   Financial Resource Strain:   . Difficulty of Paying Living Expenses:   Food Insecurity:   . Worried About Charity fundraiser in the Last Year:   . Arboriculturist in the Last Year:   Transportation Needs:   . Film/video editor (Medical):   Marland Kitchen Lack of Transportation (Non-Medical):   Physical Activity:   . Days of Exercise per Week:   . Minutes of Exercise per Session:   Stress:   . Feeling of Stress :   Social Connections:   . Frequency of Communication with Friends and Family:   . Frequency of Social Gatherings with Friends and Family:   . Attends Religious Services:   . Active Member of Clubs or Organizations:   . Attends Archivist Meetings:   Marland Kitchen Marital Status:     Tobacco Counseling Ready to quit: Not Answered Counseling given: Not Answered   Clinical Intake: Pain : No/denies pain   Activities of Daily Living In your present state of health, do you have any  difficulty performing the following activities: 05/09/2019  Hearing? Y  Comment hearing aids.  Vision? N  Difficulty concentrating or making decisions? N  Walking or climbing stairs? N  Dressing or bathing? N  Doing errands, shopping? Y  Preparing Food and eating ? N  Using the Toilet? N  In the past six months, have you accidently leaked urine? Y  Do you have problems with loss of bowel control? N  Managing your Medications? N  Managing your Finances? Y  Housekeeping or managing your Housekeeping? Y  Some recent data might be hidden  Immunizations and Health Maintenance Immunization History  Administered Date(s) Administered  . Fluad Quad(high Dose 65+) 12/05/2018  . Influenza, High Dose Seasonal PF 01/17/2014, 10/23/2014, 12/28/2017  . Influenza,inj,Quad PF,6+ Mos 11/21/2015  . Influenza-Unspecified 01/02/2013, 02/23/2013  . PPD Test 10/27/2016  . Pneumococcal Conjugate-13 10/23/2014  . Pneumococcal Polysaccharide-23 08/29/2007   Health Maintenance Due  Topic Date Due  . FOOT EXAM  Never done  . OPHTHALMOLOGY EXAM  Never done  . TETANUS/TDAP  Never done    Patient Care Team: [name], MD as PCP - General (Family Medicine)  Indicate any recent Medical Services you may have received from other than [facility] providers in the past year (date may be approximate).     Assessment:   This is a routine wellness examination for Jen. Physical assessment deferred to PCP.  Hearing/Vision screen Unable to assess. This visit is enabled though telemedicine due to Covid 19.   Dietary issues and exercise activities discussed: Current Exercise Habits: The patient does not participate in regular exercise at present, Exercise limited by: None identified Diet (meal preparation, eat out, water intake, caffeinated beverages, dairy products, fruits and vegetables): well balanced   Goals    . Maintain independence as much as possible.      Depression Screen PHQ 2/9  Scores 05/09/2019 03/26/2019  PHQ - 2 Score 0 0  PHQ- 9 Score - 0    Fall Risk Fall Risk  05/09/2019  Falls in the past year? 0  Number falls in past yr: 0  Injury with Fall? 0  Follow up Education provided;Falls prevention discussed    Cognitive Function: MMSE - Mini Mental State Exam 05/09/2019  Not completed: Refused;Unable to complete        Screening Tests Health Maintenance  Topic Date Due  . FOOT EXAM  Never done  . OPHTHALMOLOGY EXAM  Never done  . TETANUS/TDAP  Never done  . DEXA SCAN  05/08/2020 (Originally 05/11/1994)  . HEMOGLOBIN A1C  09/23/2019  . INFLUENZA VACCINE  Completed  . PNA vac Low Risk Adult  Completed    Plan:    Please schedule your next medicare wellness visit with me in 1 yr.  Continue to eat heart healthy diet (full of fruits, vegetables, whole grains, lean protein, water--limit salt, fat, and sugar intake) and increase physical activity as tolerated.  Continue doing brain stimulating activities (puzzles, reading, adult coloring books, staying active) to keep memory sharp.    I have personally reviewed and noted the following in the patient's chart:   . Medical and social history . Use of alcohol, tobacco or illicit drugs  . Current medications and supplements . Functional ability and status . Nutritional status . Physical activity . Advanced directives . List of other physicians . Hospitalizations, surgeries, and ER visits in previous 12 months . Vitals . Screenings to include cognitive, depression, and falls . Referrals and appointments  In addition, I have reviewed and discussed with patient certain preventive protocols, quality metrics, and best practice recommendations. A written personalized care plan for preventive services as well as general preventive health recommendations were provided to patient.     Naaman Plummer [place], [place]   05/09/2019

## 2019-05-09 ENCOUNTER — Encounter: Payer: Self-pay | Admitting: *Deleted

## 2019-05-09 ENCOUNTER — Ambulatory Visit (INDEPENDENT_AMBULATORY_CARE_PROVIDER_SITE_OTHER): Payer: Medicare Other | Admitting: *Deleted

## 2019-05-09 DIAGNOSIS — Z0000 Encounter for general adult medical examination without abnormal findings: Secondary | ICD-10-CM

## 2019-05-09 NOTE — Patient Instructions (Signed)
Please schedule your next medicare wellness visit with me in 1 yr.  Continue to eat heart healthy diet (full of fruits, vegetables, whole grains, lean protein, water--limit salt, fat, and sugar intake) and increase physical activity as tolerated.  Continue doing brain stimulating activities (puzzles, reading, adult coloring books, staying active) to keep memory sharp.    Chelsea Dean , Thank you for taking time to come for your Medicare Wellness Visit. I appreciate your ongoing commitment to your health goals. Please review the following plan we discussed and let me know if I can assist you in the future.   These are the goals we discussed: Goals    . Maintain independence as much as possible.       This is a list of the screening recommended for you and due dates:  Health Maintenance  Topic Date Due  . Complete foot exam   Never done  . Eye exam for diabetics  Never done  . Tetanus Vaccine  Never done  . DEXA scan (bone density measurement)  05/08/2020*  . Hemoglobin A1C  09/23/2019  . Flu Shot  Completed  . Pneumonia vaccines  Completed  *Topic was postponed. The date shown is not the original due date.     Preventive Care 84 Years and Older, Female Preventive care refers to lifestyle choices and visits with your health care provider that can promote health and wellness. This includes:  A yearly physical exam. This is also called an annual well check.  Regular dental and eye exams.  Immunizations.  Screening for certain conditions.  Healthy lifestyle choices, such as diet and exercise. What can I expect for my preventive care visit? Physical exam Your health care provider will check:  Height and weight. These may be used to calculate body mass index (BMI), which is a measurement that tells if you are at a healthy weight.  Heart rate and blood pressure.  Your skin for abnormal spots. Counseling Your health care provider may ask you questions about:  Alcohol,  tobacco, and drug use.  Emotional well-being.  Home and relationship well-being.  Sexual activity.  Eating habits.  History of falls.  Memory and ability to understand (cognition).  Work and work Statistician.  Pregnancy and menstrual history. What immunizations do I need?  Influenza (flu) vaccine  This is recommended every year. Tetanus, diphtheria, and pertussis (Tdap) vaccine  You may need a Td booster every 10 years. Varicella (chickenpox) vaccine  You may need this vaccine if you have not already been vaccinated. Zoster (shingles) vaccine  You may need this after age 21. Pneumococcal conjugate (PCV13) vaccine  One dose is recommended after age 68. Pneumococcal polysaccharide (PPSV23) vaccine  One dose is recommended after age 28. Measles, mumps, and rubella (MMR) vaccine  You may need at least one dose of MMR if you were born in 1957 or later. You may also need a second dose. Meningococcal conjugate (MenACWY) vaccine  You may need this if you have certain conditions. Hepatitis A vaccine  You may need this if you have certain conditions or if you travel or work in places where you may be exposed to hepatitis A. Hepatitis B vaccine  You may need this if you have certain conditions or if you travel or work in places where you may be exposed to hepatitis B. Haemophilus influenzae type b (Hib) vaccine  You may need this if you have certain conditions. You may receive vaccines as individual doses or as more than one  vaccine together in one shot (combination vaccines). Talk with your health care provider about the risks and benefits of combination vaccines. What tests do I need? Blood tests  Lipid and cholesterol levels. These may be checked every 5 years, or more frequently depending on your overall health.  Hepatitis C test.  Hepatitis B test. Screening  Lung cancer screening. You may have this screening every year starting at age 59 if you have a  30-pack-year history of smoking and currently smoke or have quit within the past 15 years.  Colorectal cancer screening. All adults should have this screening starting at age 39 and continuing until age 82. Your health care provider may recommend screening at age 61 if you are at increased risk. You will have tests every 1-10 years, depending on your results and the type of screening test.  Diabetes screening. This is done by checking your blood sugar (glucose) after you have not eaten for a while (fasting). You may have this done every 1-3 years.  Mammogram. This may be done every 1-2 years. Talk with your health care provider about how often you should have regular mammograms.  BRCA-related cancer screening. This may be done if you have a family history of breast, ovarian, tubal, or peritoneal cancers. Other tests  Sexually transmitted disease (STD) testing.  Bone density scan. This is done to screen for osteoporosis. You may have this done starting at age 27. Follow these instructions at home: Eating and drinking  Eat a diet that includes fresh fruits and vegetables, whole grains, lean protein, and low-fat dairy products. Limit your intake of foods with high amounts of sugar, saturated fats, and salt.  Take vitamin and mineral supplements as recommended by your health care provider.  Do not drink alcohol if your health care provider tells you not to drink.  If you drink alcohol: ? Limit how much you have to 0-1 drink a day. ? Be aware of how much alcohol is in your drink. In the [place] one drink equals one 12 oz bottle of beer (355 mL), one 5 oz glass of wine (148 mL), or one 1 oz glass of hard liquor (44 mL). Lifestyle  Take daily care of your teeth and gums.  Stay active. Exercise for at least 30 minutes on 5 or more days each week.  Do not use any products that contain nicotine or tobacco, such as cigarettes, e-cigarettes, and chewing tobacco. If you need help quitting, ask your  health care provider.  If you are sexually active, practice safe sex. Use a condom or other form of protection in order to prevent STIs (sexually transmitted infections).  Talk with your health care provider about taking a low-dose aspirin or statin. What's next?  Go to your health care provider once a year for a well check visit.  Ask your health care provider how often you should have your eyes and teeth checked.  Stay up to date on all vaccines. This information is not intended to replace advice given to you by your health care provider. Make sure you discuss any questions you have with your health care provider. Document Revised: 01/26/2018 Document Reviewed: 01/26/2018 Elsevier Patient Education  2020 [employer].

## 2019-05-24 DIAGNOSIS — Z5181 Encounter for therapeutic drug level monitoring: Secondary | ICD-10-CM | POA: Diagnosis not present

## 2019-05-24 DIAGNOSIS — I4891 Unspecified atrial fibrillation: Secondary | ICD-10-CM | POA: Diagnosis not present

## 2019-05-24 DIAGNOSIS — Z7901 Long term (current) use of anticoagulants: Secondary | ICD-10-CM | POA: Diagnosis not present

## 2019-05-24 DIAGNOSIS — R791 Abnormal coagulation profile: Secondary | ICD-10-CM | POA: Diagnosis not present

## 2019-06-14 ENCOUNTER — Telehealth: Payer: Self-pay | Admitting: Family Medicine

## 2019-06-14 DIAGNOSIS — I4891 Unspecified atrial fibrillation: Secondary | ICD-10-CM | POA: Diagnosis not present

## 2019-06-14 DIAGNOSIS — Z7901 Long term (current) use of anticoagulants: Secondary | ICD-10-CM | POA: Diagnosis not present

## 2019-06-14 DIAGNOSIS — Z5181 Encounter for therapeutic drug level monitoring: Secondary | ICD-10-CM | POA: Diagnosis not present

## 2019-06-14 NOTE — Progress Notes (Signed)
  Chronic Care Management   Outreach Note  06/14/2019 Name: Chelsea Dean MRN: [id number] DOB: May 19, 1927  Referred by: [name], MD Reason for referral : No chief complaint on file.   An unsuccessful telephone outreach was attempted today. The patient was referred to the pharmacist for assistance with care management and care coordination.   This note is not being shared with the patient for the following reason: To respect privacy (The patient or proxy has requested that the information not be shared).  Follow Up Plan:   Earney Hamburg Upstream Scheduler

## 2019-07-10 DIAGNOSIS — I4891 Unspecified atrial fibrillation: Secondary | ICD-10-CM | POA: Diagnosis not present

## 2019-07-10 DIAGNOSIS — R791 Abnormal coagulation profile: Secondary | ICD-10-CM | POA: Diagnosis not present

## 2019-07-10 DIAGNOSIS — Z7901 Long term (current) use of anticoagulants: Secondary | ICD-10-CM | POA: Diagnosis not present

## 2019-07-10 DIAGNOSIS — Z5181 Encounter for therapeutic drug level monitoring: Secondary | ICD-10-CM | POA: Diagnosis not present

## 2019-07-26 ENCOUNTER — Telehealth: Payer: Self-pay | Admitting: Family Medicine

## 2019-07-26 NOTE — Progress Notes (Signed)
  Chronic Care Management   Outreach Note  07/26/2019 Name: Andreanna Mikolajczak MRN: [id number] DOB: 1927/08/28  Referred by: [name], MD Reason for referral : No chief complaint on file.   An unsuccessful telephone outreach was attempted today. The patient was referred to the pharmacist for assistance with care management and care coordination.    Chronic Care Management   Outreach Note  07/26/2019 Name: Courtany Mcmurphy MRN: [id number] DOB: 12/22/1927  Referred by: [name], MD Reason for referral : No chief complaint on file.   An unsuccessful telephone outreach was attempted today. The patient was referred to the pharmacist for assistance with care management and care coordination.   This note is not being shared with the patient for the following reason: To respect privacy (The patient or proxy has requested that the information not be shared).  Follow Up Plan:   Earney Hamburg Upstream Scheduler  Follow Up Plan:

## 2019-07-31 DIAGNOSIS — I4891 Unspecified atrial fibrillation: Secondary | ICD-10-CM | POA: Diagnosis not present

## 2019-07-31 DIAGNOSIS — Z7901 Long term (current) use of anticoagulants: Secondary | ICD-10-CM | POA: Diagnosis not present

## 2019-07-31 DIAGNOSIS — Z5181 Encounter for therapeutic drug level monitoring: Secondary | ICD-10-CM | POA: Diagnosis not present

## 2019-09-05 DIAGNOSIS — I4891 Unspecified atrial fibrillation: Secondary | ICD-10-CM | POA: Diagnosis not present

## 2019-09-05 DIAGNOSIS — Z7901 Long term (current) use of anticoagulants: Secondary | ICD-10-CM | POA: Diagnosis not present

## 2019-09-05 DIAGNOSIS — Z5181 Encounter for therapeutic drug level monitoring: Secondary | ICD-10-CM | POA: Diagnosis not present

## 2019-09-20 ENCOUNTER — Other Ambulatory Visit: Payer: Self-pay

## 2019-09-20 ENCOUNTER — Ambulatory Visit: Payer: Medicare Other | Admitting: Family Medicine

## 2019-09-21 ENCOUNTER — Encounter: Payer: Self-pay | Admitting: Family Medicine

## 2019-09-21 ENCOUNTER — Ambulatory Visit (INDEPENDENT_AMBULATORY_CARE_PROVIDER_SITE_OTHER): Payer: Medicare Other | Admitting: Family Medicine

## 2019-09-21 VITALS — BP 118/78 | HR 60 | Temp 96.9°F | Wt 171.2 lb

## 2019-09-21 DIAGNOSIS — G8929 Other chronic pain: Secondary | ICD-10-CM

## 2019-09-21 DIAGNOSIS — M25561 Pain in right knee: Secondary | ICD-10-CM | POA: Diagnosis not present

## 2019-09-21 DIAGNOSIS — E119 Type 2 diabetes mellitus without complications: Secondary | ICD-10-CM | POA: Diagnosis not present

## 2019-09-21 DIAGNOSIS — D696 Thrombocytopenia, unspecified: Secondary | ICD-10-CM

## 2019-09-21 DIAGNOSIS — E7800 Pure hypercholesterolemia, unspecified: Secondary | ICD-10-CM

## 2019-09-21 DIAGNOSIS — I10 Essential (primary) hypertension: Secondary | ICD-10-CM

## 2019-09-21 DIAGNOSIS — N184 Chronic kidney disease, stage 4 (severe): Secondary | ICD-10-CM | POA: Diagnosis not present

## 2019-09-21 LAB — COMPREHENSIVE METABOLIC PANEL
ALT: 12 U/L (ref 0–35)
AST: 18 U/L (ref 0–37)
Albumin: 4.4 g/dL (ref 3.5–5.2)
Alkaline Phosphatase: 49 U/L (ref 39–117)
BUN: 22 mg/dL (ref 6–23)
CO2: 23 mEq/L (ref 19–32)
Calcium: 10.5 mg/dL (ref 8.4–10.5)
Chloride: 106 mEq/L (ref 96–112)
Creatinine, Ser: 1.01 mg/dL (ref 0.40–1.20)
GFR: 51.45 mL/min — ABNORMAL LOW (ref >60.00)
Glucose, Bld: 118 mg/dL — ABNORMAL HIGH (ref 70–99)
Potassium: 3.7 mEq/L (ref 3.5–5.1)
Sodium: 138 mEq/L (ref 135–145)
Total Bilirubin: 0.6 mg/dL (ref 0.2–1.2)
Total Protein: 7.4 g/dL (ref 6.0–8.3)

## 2019-09-21 LAB — URINALYSIS, ROUTINE W REFLEX MICROSCOPIC
Bilirubin Urine: NEGATIVE
Ketones, ur: NEGATIVE
Nitrite: NEGATIVE
Specific Gravity, Urine: >=1.03 — AB (ref 1.000–1.030)
Total Protein, Urine: 100 — AB
Urine Glucose: NEGATIVE
Urobilinogen, UA: 0.2 (ref 0.0–1.0)
pH: 5 (ref 5.0–8.0)

## 2019-09-21 LAB — CBC
HCT: 43.2 % (ref 36.0–46.0)
Hemoglobin: 13.5 g/dL (ref 12.0–15.0)
MCHC: 31.4 g/dL (ref 30.0–36.0)
MCV: 84.4 fl (ref 78.0–100.0)
Platelets: 86 10*3/uL — ABNORMAL LOW (ref 150.0–400.0)
RBC: 5.12 Mil/uL — ABNORMAL HIGH (ref 3.87–5.11)
RDW: 16.2 % — ABNORMAL HIGH (ref 11.5–15.5)
WBC: 4.8 10*3/uL (ref 4.0–10.5)

## 2019-09-21 LAB — HEMOGLOBIN A1C: Hgb A1c MFr Bld: 6.5 % (ref 4.6–6.5)

## 2019-09-21 LAB — MICROALBUMIN / CREATININE URINE RATIO
Creatinine,U: 146.6 mg/dL
Microalb Creat Ratio: 57 mg/g — ABNORMAL HIGH (ref 0.0–30.0)
Microalb, Ur: 83.5 mg/dL — ABNORMAL HIGH (ref 0.0–1.9)

## 2019-09-21 LAB — LDL CHOLESTEROL, DIRECT: Direct LDL: 81 mg/dL

## 2019-09-21 NOTE — Progress Notes (Signed)
Established Patient Office Visit  Subjective:  Patient ID: Chelsea Dean, female    DOB: [birth year]  Age: 86 y.o. MRN: [id number]  CC:  Chief Complaint  Patient presents with  . Other    6 month f/u  . Knee Pain    Pt is currently having knee pain and would like to discuss a cortisone injection.     HPI Ellamarie Naeve presents for follow-up of hypertension diabetes elevated cholesterol thrombocytopenia and right knee pain.  Requested injection.  Last injection in his knee was over 7 months ago.  They have helped in the past.  She has had issue with them.  Continues to take all medications as directed.  Has been doing well and has no other complaints.  Past Medical History:  Diagnosis Date  . A-fib ([facility]) 10/19/2016  . Abdominal aortic aneurysm (AAA) without rupture ([place]) 09/15/2013  . Acute idiopathic gout of right foot   . AKI (acute kidney injury) ([facility]) 10/18/2016  . Anticoagulated on Coumadin   . Arthritis   . Diabetes mellitus without complication ([place])   . Diverticular disease of large intestine 04/16/2013  . Diverticulosis   . DM2 (diabetes mellitus, type 2) ([facility]) 10/19/2016  . Dysrhythmia    Atrial fib  . History of colon polyps 04/16/2013  . Hx of colonic polyps   . Hyperlipidemia   . Hypertension   . Long term current use of anticoagulant therapy 02/05/2015  . Lumbar radiculopathy 03/28/2014  . Microalbuminuria 04/16/2013  . Osteoarthritis 04/16/2013  . Osteoarthritis of cervical spine 03/28/2014  . Pelvic mass 10/19/2016  . Right renal mass   . SBO (small bowel obstruction) ([facility]) 10/18/2016  . Thrombocytopenia ([place]) 04/16/2013   Overview:  referred to hematology 01/2104    Past Surgical History:  Procedure Laterality Date  . ABDOMINAL AORTIC ANEURYSM REPAIR    . ABDOMINAL HYSTERECTOMY    . IR GENERIC HISTORICAL  07/01/2015   IR RADIOLOGIST EVAL & MGMT 07/01/2015 [facility] INTERV RAD    Family History  Problem Relation Age of Onset  . Cancer Sister        Rectal and Stomach     Social History   Socioeconomic History  . Marital status: Married    Spouse name: Not on file  . Number of children: Not on file  . Years of education: Not on file  . Highest education level: Not on file  Occupational History  . Occupation: retired [employer]  . Smoking status: Never Smoker  . Smokeless tobacco: Current User    Types: Snuff  Vaping Use  . Vaping Use: Never used  Substance and Sexual Activity  . Alcohol use: No    Alcohol/week: 0.0 standard drinks  . Drug use: No  . Sexual activity: Not on file  Other Topics Concern  . Not on file  Social History Narrative   Admitted to [facility] 10/27/16   Married   Use snuff   Alcohol none   DNR   Social Determinants of Health   Financial Resource Strain: Low Risk   . Difficulty of Paying Living Expenses: Not hard at all  Food Insecurity: No Food Insecurity  . Worried About Charity fundraiser in the Last Year: Never true  . Ran Out of Food in the Last Year: Never true  Transportation Needs: No Transportation Needs  . Lack of Transportation (Medical): No  . Lack of Transportation (Non-Medical): No  Physical Activity:   .  Days of Exercise per Week:   . Minutes of Exercise per Session:   Stress:   . Feeling of Stress :   Social Connections:   . Frequency of Communication with Friends and Family:   . Frequency of Social Gatherings with Friends and Family:   . Attends Religious Services:   . Active Member of Clubs or Organizations:   . Attends Archivist Meetings:   Marland Kitchen Marital Status:   Intimate Partner Violence:   . Fear of Current or Ex-Partner:   . Emotionally Abused:   Marland Kitchen Physically Abused:   . Sexually Abused:     Outpatient Medications Prior to Visit  Medication Sig Dispense Refill  . amLODipine (NORVASC) 10 MG tablet TAKE ONE TABLET BY MOUTH EACH DAY FOR BLOOD PRESSURE 90 tablet 1  . atorvastatin (LIPITOR) 10 MG tablet Take 1 tablet (10 mg total) by mouth  daily. 90 tablet 3  . diclofenac sodium (VOLTAREN) 1 % GEL Apply 2 g topically daily as needed (KNEE PAIN).    Marland Kitchen diphenhydrAMINE (BENADRYL) 50 MG tablet Take 1 tablet (50 mg total) by mouth as directed for 1 dose. Take Benadryl 50 mg po 1 hr prior to CT (app't 09/05/2017) 1 tablet 0  . glipiZIDE (GLUCOTROL XL) 10 MG 24 hr tablet TAKE 1 TABLET(10 MG) BY MOUTH DAILY WITH BREAKFAST 90 tablet 2  . losartan (COZAAR) 100 MG tablet Take 1 tablet (100 mg total) by mouth daily. 90 tablet 1  . metoprolol tartrate (LOPRESSOR) 100 MG tablet TAKE ONE TABLET BY MOUTH TWICE A DAY 180 tablet 1  . polyethylene glycol (MIRALAX / GLYCOLAX) packet Take 17 g by mouth daily. 14 each 0  . warfarin (COUMADIN) 2.5 MG tablet Take 2.5 mg by mouth daily. 2.5 mg daily except for Tuesday Thursday Saturday patient takes one half tablet (1.25 mg)     No facility-administered medications prior to visit.    Allergies  Allergen Reactions  . Ace Inhibitors   . Contrast Media [Iodinated Diagnostic Agents]   . Nsaids     ROS Review of Systems  Constitutional: Negative.   HENT: Negative.   Eyes: Negative for photophobia and visual disturbance.  Respiratory: Negative.   Cardiovascular: Negative.   Gastrointestinal: Negative.   Endocrine: Negative for polyphagia and polyuria.  Genitourinary: Negative for dysuria, frequency and urgency.  Musculoskeletal: Positive for arthralgias and joint swelling.  Skin: Negative for pallor and rash.  Neurological: Negative for speech difficulty and light-headedness.  Hematological: Does not bruise/bleed easily.  Psychiatric/Behavioral: Negative.       Objective:    Physical Exam Vitals and nursing note reviewed.  Constitutional:      Appearance: Normal appearance.  HENT:     Head: Normocephalic and atraumatic.     Right Ear: External ear normal.     Left Ear: External ear normal.  Eyes:     General: No scleral icterus.       Right eye: No discharge.        Left eye: No  discharge.     Extraocular Movements: Extraocular movements intact.     Conjunctiva/sclera: Conjunctivae normal.     Pupils: Pupils are equal, round, and reactive to light.  Cardiovascular:     Rate and Rhythm: Normal rate and regular rhythm.  Pulmonary:     Effort: Pulmonary effort is normal.     Breath sounds: Normal breath sounds.  Abdominal:     General: Bowel sounds are normal.  Musculoskeletal:  Right knee: Swelling present. No deformity, effusion, erythema or ecchymosis. Decreased range of motion. Tenderness present over the medial joint line.  Skin:    General: Skin is warm and dry.  Neurological:     Mental Status: She is alert and oriented to person, place, and time.  Psychiatric:        Mood and Affect: Mood normal.        Behavior: Behavior normal.   Aspiration/Injection Procedure Note Lamerle Jabs [id number] 09/14/1927  Procedure: Injection Indications: pain  Procedure Details Consent: Risks of procedure as well as the alternatives and risks of each were explained to the (patient/caregiver).  Consent for procedure obtained. Time Out: Verified patient identification, verified procedure, site/side was marked, verified correct patient position, special equipment/implants available, medications/allergies/relevent history reviewed, required imaging and test results available.  Medial superior sub patellar space was identified and marked. Cleansed with bet x 3. Injected below. Patient tolerated procedure well. Injected with 1cc each lidocaine plain 2% and .025 % marcaine and 80mg  depomedrol.   Local Anesthesia Used:Lidocaine 2% plain; 54mL Amount of Fluid Aspirated: 36mL Character of Fluid: NA Fluid was sent for:NA A sterile dressing was applied.  Patient did tolerate procedure well. Estimated blood loss: 0  [name] 09/21/2019, 10:40 AM   BP 118/78 (BP Location: Left Arm, Patient Position: Sitting, Cuff Size: Normal)   Pulse 60   Temp (!) 96.9 F  (36.1 C) (Temporal)   Wt 171 lb 3.2 oz (77.7 kg)   SpO2 98%   BMI 31.31 kg/m  Wt Readings from Last 3 Encounters:  09/21/19 171 lb 3.2 oz (77.7 kg)  03/26/19 172 lb (78 kg)  07/19/18 174 lb (78.9 kg)     Health Maintenance Due  Topic Date Due  . FOOT EXAM  Never done  . OPHTHALMOLOGY EXAM  Never done  . COVID-19 Vaccine (1) Never done  . TETANUS/TDAP  Never done  . INFLUENZA VACCINE  09/16/2019    There are no preventive care reminders to display for this patient.  Lab Results  Component Value Date   TSH 3.26 03/02/2017   Lab Results  Component Value Date   WBC 5.3 03/26/2019   HGB 14.0 03/26/2019   HCT 44.8 03/26/2019   MCV 82.1 03/26/2019   PLT 104.0 (L) 03/26/2019   Lab Results  Component Value Date   NA 139 03/26/2019   K 4.6 03/26/2019   CO2 29 03/26/2019   GLUCOSE 108 (H) 03/26/2019   BUN 21 03/26/2019   CREATININE 1.04 03/26/2019   BILITOT 0.5 08/15/2017   ALKPHOS 62 08/15/2017   AST 20 08/15/2017   ALT 17 08/15/2017   PROT 7.7 08/15/2017   ALBUMIN 4.4 08/15/2017   CALCIUM 10.8 (H) 03/26/2019   ANIONGAP 6 04/01/2018   GFR 49.80 (L) 03/26/2019   Lab Results  Component Value Date   CHOL 210 (H) 04/19/2018   Lab Results  Component Value Date   HDL 40.50 04/19/2018   Lab Results  Component Value Date   LDLCALC 143 (H) 04/19/2018   Lab Results  Component Value Date   TRIG 134.0 04/19/2018   Lab Results  Component Value Date   CHOLHDL 5 04/19/2018   Lab Results  Component Value Date   HGBA1C 6.6 (H) 03/26/2019      Assessment & Plan:   Problem List Items Addressed This Visit      Cardiovascular and Mediastinum   Essential hypertension   Relevant Orders   CBC   Comprehensive  metabolic panel   Urinalysis, Routine w reflex microscopic   Microalbumin / creatinine urine ratio     Endocrine   Type 2 diabetes mellitus without complication, without long-term current use of insulin (HCC) - Primary   Relevant Orders   Comprehensive  metabolic panel   Hemoglobin A1c   Urinalysis, Routine w reflex microscopic   Microalbumin / creatinine urine ratio     Genitourinary   Stage 4 chronic kidney disease (HCC)   Relevant Orders   Comprehensive metabolic panel   Microalbumin / creatinine urine ratio     Other   Thrombocytopenia (HCC)   Relevant Orders   CBC   Chronic pain of right knee   Elevated LDL cholesterol level   Relevant Orders   Comprehensive metabolic panel   LDL cholesterol, direct      No orders of the defined types were placed in this encounter.   Follow-up: Return in about 6 months (around 03/23/2020).  Information was given on joint injections and aftercare.  Follow-up with any concerns otherwise in 6 months. [name], MD

## 2019-09-27 ENCOUNTER — Telehealth: Payer: Self-pay | Admitting: Family Medicine

## 2019-09-27 NOTE — Progress Notes (Signed)
°  Chronic Care Management   Outreach Note  09/27/2019 Name: Chelsea Dean MRN: [id number] DOB: 1927-10-14  Referred by: [name], MD Reason for referral : No chief complaint on file.   An unsuccessful telephone outreach was attempted today. The patient was referred to the pharmacist for assistance with care management and care coordination.   Follow Up Plan:   Earney Hamburg Upstream Scheduler

## 2019-10-08 DIAGNOSIS — R791 Abnormal coagulation profile: Secondary | ICD-10-CM | POA: Diagnosis not present

## 2019-10-08 DIAGNOSIS — Z7901 Long term (current) use of anticoagulants: Secondary | ICD-10-CM | POA: Diagnosis not present

## 2019-10-08 DIAGNOSIS — Z5181 Encounter for therapeutic drug level monitoring: Secondary | ICD-10-CM | POA: Diagnosis not present

## 2019-10-08 DIAGNOSIS — I4891 Unspecified atrial fibrillation: Secondary | ICD-10-CM | POA: Diagnosis not present

## 2019-11-12 DIAGNOSIS — Z7901 Long term (current) use of anticoagulants: Secondary | ICD-10-CM | POA: Diagnosis not present

## 2019-11-12 DIAGNOSIS — I4891 Unspecified atrial fibrillation: Secondary | ICD-10-CM | POA: Diagnosis not present

## 2019-11-12 DIAGNOSIS — Z5181 Encounter for therapeutic drug level monitoring: Secondary | ICD-10-CM | POA: Diagnosis not present

## 2019-11-24 ENCOUNTER — Other Ambulatory Visit: Payer: Self-pay | Admitting: Family Medicine

## 2019-11-24 DIAGNOSIS — I10 Essential (primary) hypertension: Secondary | ICD-10-CM

## 2019-11-24 DIAGNOSIS — N183 Chronic kidney disease, stage 3 unspecified: Secondary | ICD-10-CM

## 2019-11-24 DIAGNOSIS — E119 Type 2 diabetes mellitus without complications: Secondary | ICD-10-CM

## 2019-12-05 ENCOUNTER — Ambulatory Visit (INDEPENDENT_AMBULATORY_CARE_PROVIDER_SITE_OTHER): Payer: Medicare Other

## 2019-12-05 ENCOUNTER — Other Ambulatory Visit: Payer: Self-pay

## 2019-12-05 DIAGNOSIS — Z23 Encounter for immunization: Secondary | ICD-10-CM

## 2019-12-24 DIAGNOSIS — Z5181 Encounter for therapeutic drug level monitoring: Secondary | ICD-10-CM | POA: Diagnosis not present

## 2019-12-24 DIAGNOSIS — I4891 Unspecified atrial fibrillation: Secondary | ICD-10-CM | POA: Diagnosis not present

## 2019-12-24 DIAGNOSIS — Z7901 Long term (current) use of anticoagulants: Secondary | ICD-10-CM | POA: Diagnosis not present

## 2020-01-24 ENCOUNTER — Other Ambulatory Visit: Payer: Self-pay | Admitting: Family Medicine

## 2020-01-24 DIAGNOSIS — I482 Chronic atrial fibrillation, unspecified: Secondary | ICD-10-CM

## 2020-01-24 DIAGNOSIS — I10 Essential (primary) hypertension: Secondary | ICD-10-CM

## 2020-01-24 NOTE — Telephone Encounter (Signed)
Last OV 09/21/19 Last 03/26/19  #90/1  #180/1

## 2020-01-26 DIAGNOSIS — R509 Fever, unspecified: Secondary | ICD-10-CM | POA: Diagnosis not present

## 2020-01-28 ENCOUNTER — Telehealth: Payer: Self-pay | Admitting: Family Medicine

## 2020-01-28 NOTE — Telephone Encounter (Signed)
I called and left voice message. She will be contacted by the infusion team. She can provide a copy of her results to them when she is contacted.

## 2020-01-28 NOTE — Telephone Encounter (Signed)
Please advise message below  °

## 2020-01-28 NOTE — Telephone Encounter (Signed)
I need date of symptom onset. She will need to fax her results to [phone] or email to [email or web address]

## 2020-01-28 NOTE — Telephone Encounter (Signed)
Patients daughter [name] is calling to let the office know that patient tested positive for COVID. She states that they were told to reach out to the PCP and be referred to a place that does the IV medication for COVID. Please call her at [phone] and advise.

## 2020-01-28 NOTE — Telephone Encounter (Signed)
Per patients daughter [name], patient started with symptoms on 01/25/20 with lots of sneezing and runny nose so they went to be tested. There are no fevers, patient still has taste and smell runny nose have subsided only symptoms as of now is very fatigue. They do not have access to either fax or email would like to know if she could stop results bu the office? Please advise.

## 2020-01-28 NOTE — Telephone Encounter (Signed)
Spoke with patients daughter who verbally understood message below.

## 2020-01-29 ENCOUNTER — Other Ambulatory Visit: Payer: Self-pay | Admitting: Nurse Practitioner

## 2020-01-29 DIAGNOSIS — E119 Type 2 diabetes mellitus without complications: Secondary | ICD-10-CM

## 2020-01-29 DIAGNOSIS — I10 Essential (primary) hypertension: Secondary | ICD-10-CM

## 2020-01-29 DIAGNOSIS — U071 COVID-19: Secondary | ICD-10-CM

## 2020-01-29 DIAGNOSIS — N184 Chronic kidney disease, stage 4 (severe): Secondary | ICD-10-CM

## 2020-01-29 NOTE — Progress Notes (Signed)
I connected by phone with Chelsea Dean on 01/29/2020 at 10:02 AM to discuss the potential use of a new treatment for mild to moderate COVID-19 viral infection in non-hospitalized patients.  This patient is a 86 y.o. female that meets the FDA criteria for Emergency Use Authorization of COVID monoclonal antibody casirivimab/imdevimab, bamlanivimab/eteseviamb, or sotrovimab.  Has a (+) direct SARS-CoV-2 viral test result  Has mild or moderate COVID-19   Is NOT hospitalized due to COVID-19  Is within 10 days of symptom onset  Has at least one of the high risk factor(s) for progression to severe COVID-19 and/or hospitalization as defined in EUA.  Specific high risk criteria : Older age (>/= 84 yo), BMI > 25, Diabetes and Cardiovascular disease or hypertension   I have spoken and communicated the following to the patient or parent/caregiver regarding COVID monoclonal antibody treatment:  1. FDA has authorized the emergency use for the treatment of mild to moderate COVID-19 in adults and pediatric patients with positive results of direct SARS-CoV-2 viral testing who are 27 years of age and older weighing at least 40 kg, and who are at high risk for progressing to severe COVID-19 and/or hospitalization.  2. The significant known and potential risks and benefits of COVID monoclonal antibody, and the extent to which such potential risks and benefits are unknown.  3. Information on available alternative treatments and the risks and benefits of those alternatives, including clinical trials.  4. Patients treated with COVID monoclonal antibody should continue to self-isolate and use infection control measures (e.g., wear mask, isolate, social distance, avoid sharing personal items, clean and disinfect "high touch" surfaces, and frequent handwashing) according to CDC guidelines.   5. The patient or parent/caregiver has the option to accept or refuse COVID monoclonal antibody treatment.  After  reviewing this information with the patient, the patient has agreed to receive one of the available covid 19 monoclonal antibodies and will be provided an appropriate fact sheet prior to infusion. Jobe Gibbon, NP 01/29/2020 10:02 AM

## 2020-01-30 ENCOUNTER — Ambulatory Visit (HOSPITAL_COMMUNITY)
Admission: RE | Admit: 2020-01-30 | Discharge: 2020-01-30 | Disposition: A | Discharge status: Home / Self Care | Payer: Medicare Other | Source: Ambulatory Visit | Attending: Pulmonary Disease | Admitting: Pulmonary Disease

## 2020-01-30 DIAGNOSIS — E119 Type 2 diabetes mellitus without complications: Secondary | ICD-10-CM | POA: Diagnosis present

## 2020-01-30 DIAGNOSIS — N184 Chronic kidney disease, stage 4 (severe): Secondary | ICD-10-CM | POA: Diagnosis present

## 2020-01-30 DIAGNOSIS — I10 Essential (primary) hypertension: Secondary | ICD-10-CM | POA: Diagnosis present

## 2020-01-30 DIAGNOSIS — Z23 Encounter for immunization: Secondary | ICD-10-CM | POA: Diagnosis not present

## 2020-01-30 DIAGNOSIS — U071 COVID-19: Secondary | ICD-10-CM | POA: Diagnosis present

## 2020-01-30 MED ORDER — METHYLPREDNISOLONE SODIUM SUCC 125 MG IJ SOLR: 125.0000 mg | Freq: Once | INTRAMUSCULAR | Status: DC | PRN

## 2020-01-30 MED ORDER — DIPHENHYDRAMINE HCL 50 MG/ML IJ SOLN: 50.0000 mg | Freq: Once | INTRAMUSCULAR | Status: DC | PRN

## 2020-01-30 MED ORDER — EPINEPHRINE 0.3 MG/0.3ML IJ SOAJ: 0.3000 mg | Freq: Once | INTRAMUSCULAR | Status: DC | PRN

## 2020-01-30 MED ORDER — SODIUM CHLORIDE 0.9 % IV SOLN: Freq: Once | INTRAVENOUS | Status: AC

## 2020-01-30 MED ORDER — FAMOTIDINE IN NACL 20-0.9 MG/50ML-% IV SOLN: 20.0000 mg | Freq: Once | INTRAVENOUS | Status: DC | PRN

## 2020-01-30 MED ORDER — ALBUTEROL SULFATE HFA 108 (90 BASE) MCG/ACT IN AERS: 2.0000 | INHALATION_SPRAY | Freq: Once | RESPIRATORY_TRACT | Status: DC | PRN

## 2020-01-30 MED ORDER — SODIUM CHLORIDE 0.9 % IV SOLN: INTRAVENOUS | Status: DC | PRN

## 2020-01-30 NOTE — Progress Notes (Signed)
Patient reviewed Fact Sheet for Patients, Parents, and Caregivers for Emergency Use Authorization (EUA) of bamlanivimab and etesevimab for the Treatment of Coronavirus. Patient also reviewed and is agreeable to the estimated cost of treatment. Patient is agreeable to proceed.   

## 2020-01-30 NOTE — Progress Notes (Signed)
  Diagnosis: COVID-19  Physician: Dr. Joya Gaskins  Procedure: [facility] Med: bamlanivimab\etesevimab infusion - Provided patient with bamlanimivab\etesevimab fact sheet for patients, parents and caregivers prior to infusion.  Complications: No immediate complications noted.  Discharge: Discharged home   Chelsea Dean 01/30/2020

## 2020-01-30 NOTE — Discharge Instructions (Signed)
10 Things You Can Do to Manage Your COVID-19 Symptoms at Home If you have possible or confirmed COVID-19: 1. Stay home from work and school. And stay away from other public places. If you must go out, avoid using any kind of public transportation, ridesharing, or taxis. 2. Monitor your symptoms carefully. If your symptoms get worse, call your healthcare provider immediately. 3. Get rest and stay hydrated. 4. If you have a medical appointment, call the healthcare provider ahead of time and tell them that you have or may have COVID-19. 5. For medical emergencies, call 911 and notify the dispatch personnel that you have or may have COVID-19. 6. Cover your cough and sneezes with a tissue or use the inside of your elbow. 7. Wash your hands often with soap and water for at least 20 seconds or clean your hands with an alcohol-based hand sanitizer that contains at least 60% alcohol. 8. As much as possible, stay in a specific room and away from other people in your home. Also, you should use a separate bathroom, if available. If you need to be around other people in or outside of the home, wear a mask. 9. Avoid sharing personal items with other people in your household, like dishes, towels, and bedding. 10. Clean all surfaces that are touched often, like counters, tabletops, and doorknobs. Use household cleaning sprays or wipes according to the label instructions. cdc.gov/coronavirus 08/16/2018 This information is not intended to replace advice given to you by your health care provider. Make sure you discuss any questions you have with your health care provider. Document Revised: 01/18/2019 Document Reviewed: 01/18/2019 Elsevier Patient Education  2020 Elsevier Inc. What types of side effects do monoclonal antibody drugs cause?  Common side effects  In general, the more common side effects caused by monoclonal antibody drugs include: . Allergic reactions, such as hives or itching . Flu-like signs and  symptoms, including chills, fatigue, fever, and muscle aches and pains . Nausea, vomiting . Diarrhea . Skin rashes . Low blood pressure   The CDC is recommending patients who receive monoclonal antibody treatments wait at least 90 days before being vaccinated.  Currently, there are no data on the safety and efficacy of mRNA COVID-19 vaccines in persons who received monoclonal antibodies or convalescent plasma as part of COVID-19 treatment. Based on the estimated half-life of such therapies as well as evidence suggesting that reinfection is uncommon in the 90 days after initial infection, vaccination should be deferred for at least 90 days, as a precautionary measure until additional information becomes available, to avoid interference of the antibody treatment with vaccine-induced immune responses. If you have any questions or concerns after the infusion please call the Advanced Practice Provider on call at [phone]. This number is ONLY intended for your use regarding questions or concerns about the infusion post-treatment side-effects.  Please do not provide this number to others for use. For return to work notes please contact your primary care provider.   If someone you know is interested in receiving treatment please have them call the COVID hotline at [phone].   

## 2020-02-08 ENCOUNTER — Encounter (HOSPITAL_BASED_OUTPATIENT_CLINIC_OR_DEPARTMENT_OTHER): Payer: Self-pay | Admitting: Emergency Medicine

## 2020-02-08 ENCOUNTER — Emergency Department (HOSPITAL_BASED_OUTPATIENT_CLINIC_OR_DEPARTMENT_OTHER): Payer: Medicare Other

## 2020-02-08 ENCOUNTER — Emergency Department (HOSPITAL_BASED_OUTPATIENT_CLINIC_OR_DEPARTMENT_OTHER)
Admission: EM | Admit: 2020-02-08 | Discharge: 2020-02-08 | Disposition: A | Discharge status: Home / Self Care | Payer: Medicare Other | Attending: Emergency Medicine | Admitting: Emergency Medicine

## 2020-02-08 ENCOUNTER — Other Ambulatory Visit: Payer: Self-pay

## 2020-02-08 DIAGNOSIS — F17290 Nicotine dependence, other tobacco product, uncomplicated: Secondary | ICD-10-CM | POA: Diagnosis not present

## 2020-02-08 DIAGNOSIS — Z7984 Long term (current) use of oral hypoglycemic drugs: Secondary | ICD-10-CM | POA: Diagnosis not present

## 2020-02-08 DIAGNOSIS — I129 Hypertensive chronic kidney disease with stage 1 through stage 4 chronic kidney disease, or unspecified chronic kidney disease: Secondary | ICD-10-CM | POA: Diagnosis not present

## 2020-02-08 DIAGNOSIS — Z7901 Long term (current) use of anticoagulants: Secondary | ICD-10-CM | POA: Insufficient documentation

## 2020-02-08 DIAGNOSIS — M1712 Unilateral primary osteoarthritis, left knee: Secondary | ICD-10-CM | POA: Diagnosis not present

## 2020-02-08 DIAGNOSIS — N184 Chronic kidney disease, stage 4 (severe): Secondary | ICD-10-CM | POA: Insufficient documentation

## 2020-02-08 DIAGNOSIS — M25462 Effusion, left knee: Secondary | ICD-10-CM | POA: Diagnosis not present

## 2020-02-08 DIAGNOSIS — Z79899 Other long term (current) drug therapy: Secondary | ICD-10-CM | POA: Insufficient documentation

## 2020-02-08 DIAGNOSIS — M25562 Pain in left knee: Secondary | ICD-10-CM | POA: Diagnosis present

## 2020-02-08 DIAGNOSIS — E1122 Type 2 diabetes mellitus with diabetic chronic kidney disease: Secondary | ICD-10-CM | POA: Insufficient documentation

## 2020-02-08 DIAGNOSIS — M10062 Idiopathic gout, left knee: Secondary | ICD-10-CM | POA: Diagnosis not present

## 2020-02-08 MED ORDER — HYDROCODONE-ACETAMINOPHEN 5-325 MG PO TABS
1.0000 | ORAL_TABLET | Freq: Once | ORAL | Status: AC
  Administered 2020-02-08: 1 via ORAL
  Filled 2020-02-08: qty 1

## 2020-02-08 MED ORDER — PREDNISONE 50 MG PO TABS
60.0000 mg | ORAL_TABLET | Freq: Once | ORAL | Status: AC
  Administered 2020-02-08: 60 mg via ORAL
  Filled 2020-02-08: qty 1

## 2020-02-08 MED ORDER — HYDROCODONE-ACETAMINOPHEN 5-325 MG PO TABS: 1.0000 | ORAL_TABLET | ORAL | 0 refills | Status: DC | PRN

## 2020-02-08 MED ORDER — PREDNISONE 50 MG PO TABS: 50.0000 mg | ORAL_TABLET | Freq: Every day | ORAL | 0 refills | Status: DC

## 2020-02-08 MED ORDER — LIDOCAINE 4 % EX PTCH: 1.0000 | MEDICATED_PATCH | Freq: Two times a day (BID) | CUTANEOUS | 0 refills | Status: DC

## 2020-02-08 NOTE — ED Provider Notes (Signed)
[facility] EMERGENCY DEPARTMENT Provider Note   CSN: [id number] Arrival date & time: 02/08/20  0158   History Chief Complaint  Patient presents with   Knee Pain    Chelsea Dean is a 86 y.o. female.  The history is provided by the patient and a relative.  Knee Pain She has a history of atrial fibrillation anticoagulated on warfarin, hypertension, diabetes, gout and comes in because of pain and swelling of her left knee which started yesterday.  This is similar to exacerbations of gout in the past.  She has been taking acetaminophen without relief.  She denies any trauma.  Past Medical History:  Diagnosis Date   A-fib ([place]) 10/19/2016   Abdominal aortic aneurysm (AAA) without rupture ([place]) 09/15/2013   Acute idiopathic gout of right foot    AKI (acute kidney injury) ([place]) 10/18/2016   Anticoagulated on Coumadin    Arthritis    Diabetes mellitus without complication ([place])    Diverticular disease of large intestine 04/16/2013   Diverticulosis    DM2 (diabetes mellitus, type 2) ([place]) 10/19/2016   Dysrhythmia    Atrial fib   History of colon polyps 04/16/2013   Hx of colonic polyps    Hyperlipidemia    Hypertension    Long term current use of anticoagulant therapy 02/05/2015   Lumbar radiculopathy 03/28/2014   Microalbuminuria 04/16/2013   Osteoarthritis 04/16/2013   Osteoarthritis of cervical spine 03/28/2014   Pelvic mass 10/19/2016   Right renal mass    SBO (small bowel obstruction) ([place]) 10/18/2016   Thrombocytopenia ([place]) 04/16/2013   Overview:  referred to hematology 01/2104    Patient Active Problem List   Diagnosis Date Noted   Stage 4 chronic kidney disease ([place]) 09/21/2019   Elevated LDL cholesterol level 07/19/2018   Need for influenza vaccination 12/28/2017   Chronic pain of right knee 10/19/2017   Stage 3 chronic kidney disease ([place]) 07/25/2017   Hypomagnesemia 03/31/2017   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/31/2017   Abnormal urinalysis  03/16/2017   Hypokalemia 10/31/2016   Lactic acidosis 10/31/2016   Atrial fibrillation with RVR ([place]) 10/31/2016   Elevated liver enzymes 10/31/2016   Anticoagulated on Coumadin 10/31/2016   Elevated INR 10/31/2016   Hyperlipidemia 10/31/2016   Essential hypertension 10/31/2016   Acute idiopathic gout of right foot    A-fib ([place]) 10/19/2016   Type 2 diabetes mellitus without complication, without long-term current use of insulin ([place]) 10/19/2016   Pelvic mass 10/19/2016   SBO (small bowel obstruction) ([place]) 10/18/2016   Long term current use of anticoagulant therapy 02/05/2015   Lumbar radiculopathy 03/28/2014   Osteoarthritis of cervical spine 03/28/2014   Abdominal aortic aneurysm (AAA) without rupture ([place]) 09/15/2013   Diverticular disease of large intestine 04/16/2013   History of colon polyps 04/16/2013   Microalbuminuria 04/16/2013   Osteoarthritis 04/16/2013   Thrombocytopenia ([place]) 04/16/2013    Past Surgical History:  Procedure Laterality Date   ABDOMINAL AORTIC ANEURYSM REPAIR     ABDOMINAL HYSTERECTOMY     IR GENERIC HISTORICAL  07/01/2015   IR RADIOLOGIST EVAL & MGMT 07/01/2015 [facility] INTERV RAD     OB History   No obstetric history on file.     Family History  Problem Relation Age of Onset   Cancer Sister        Rectal and Stomach    Social History   Tobacco Use   Smoking status: Never Smoker   Smokeless tobacco: Current User    Types: Snuff  Vaping Use  Vaping Use: Never used  Substance Use Topics   Alcohol use: No    Alcohol/week: 0.0 standard drinks   Drug use: No    Home Medications Prior to Admission medications   Medication Sig Start Date End Date Taking? Authorizing Provider  amLODipine (NORVASC) 10 MG tablet TAKE 1 TABLET BY MOUTH EVERY DAY FOR BLOOD PRESSURE 01/24/20   [name], MD  atorvastatin (LIPITOR) 10 MG tablet Take 1 tablet (10 mg total) by mouth daily. 03/26/19   [name], MD  diclofenac sodium (VOLTAREN) 1 % GEL Apply 2 g topically daily as needed (KNEE PAIN).    [provider]  diphenhydrAMINE (BENADRYL) 50 MG tablet Take 1 tablet (50 mg total) by mouth as directed for 1 dose. Take Benadryl 50 mg po 1 hr prior to CT (app't 09/05/2017) 09/05/17   Criselda Peaches, MD  glipiZIDE (GLUCOTROL XL) 10 MG 24 hr tablet TAKE 1 TABLET(10 MG) BY MOUTH DAILY WITH BREAKFAST 03/26/19   [name], MD  HYDROcodone-acetaminophen (NORCO) 5-325 MG tablet Take 1 tablet by mouth every 4 (four) hours as needed for moderate pain. 16/10/96   Delora Fuel, MD  Lidocaine (HM LIDOCAINE PATCH) 4 % PTCH Apply 1 patch topically every 12 (twelve) hours. 04/54/09   Delora Fuel, MD  losartan (COZAAR) 100 MG tablet TAKE 1 TABLET BY MOUTH EVERY DAY 11/26/19   [name], MD  metoprolol tartrate (LOPRESSOR) 100 MG tablet TAKE 1 TABLET BY MOUTH 2 TIMES A DAY 01/24/20   [name], MD  polyethylene glycol [facility] / Floria Raveling) packet Take 17 g by mouth daily. 10/28/16   Dessa Phi, DO  predniSONE (DELTASONE) 50 MG tablet Take 1 tablet (50 mg total) by mouth daily. 81/19/14   Delora Fuel, MD  warfarin (COUMADIN) 2.5 MG tablet Take 2.5 mg by mouth daily. 2.5 mg daily except for Tuesday Thursday Saturday patient takes one half tablet (1.25 mg)    [provider]    Allergies    Ace inhibitors, Contrast media [iodinated diagnostic agents], and Nsaids  Review of Systems   Review of Systems  All other systems reviewed and are negative.   Physical Exam Updated Vital Signs BP 126/64 (BP Location: Left Arm)    Pulse 77    Temp 99.5 F (37.5 C)    Resp 16    Wt 74.4 kg    SpO2 96%    BMI 30.00 kg/m   Physical Exam Vitals and nursing note reviewed.   86 year old female, resting comfortably and in no acute distress. Vital signs are normal. Oxygen saturation is 96%, which is normal. Head is normocephalic and atraumatic. PERRLA, EOMI.  Oropharynx is clear. Neck is nontender and supple without adenopathy or JVD. Back is nontender and there is no CVA tenderness. Lungs are clear without rales, wheezes, or rhonchi. Chest is nontender. Heart has regular rate and rhythm without murmur. Abdomen is soft, flat, nontender without masses or hepatosplenomegaly and peristalsis is normoactive. Extremities: Moderate swelling of left knee with warmth and effusion consistent with acute gout exacerbation. Skin is warm and dry without rash. Neurologic: Awake and alert, cranial nerves are intact, moves all extremities equally.  ED Results / Procedures / Treatments    Radiology DG Knee Complete 4 Views Left  Result Date: 02/08/2020 CLINICAL DATA:  Left knee pain and swelling since yesterday. EXAM: LEFT KNEE - COMPLETE 4+ VIEW COMPARISON:  12/28/2017 FINDINGS: No acute fracture or dislocation is identified. There is  a new small knee joint effusion. Moderate to severe medial compartment joint space narrowing is less prominent than what was depicted on the prior radiographs. There is tricompartmental marginal osteophytosis. Chondrocalcinosis is again noted in the lateral compartment. Atherosclerotic vascular calcifications are noted. IMPRESSION: 1. Small knee joint effusion without acute osseous abnormality identified. 2. Tricompartmental osteoarthrosis, greatest in the medial compartment. Electronically Signed   By: Logan Bores M.D.   On: 02/08/2020 02:41    Procedures Procedures   Medications Ordered in ED Medications  HYDROcodone-acetaminophen (NORCO/VICODIN) 5-325 MG per tablet 1 tablet (has no administration in time range)  predniSONE (DELTASONE) tablet 60 mg (has no administration in time range)    ED Course  I have reviewed the triage vital signs and the nursing notes.  Pertinent imaging results that were available during my care of the patient were reviewed by me and considered in my medical decision making (see chart for  details).  MDM Rules/Calculators/A&P Left knee pain with effusion which appears to be acute exacerbation of gout.  X-rays show evidence of tricompartmental arthritis.  Old records are reviewed, and she has no relevant past visits.  She is discharged with prescription for prednisone, topical lidocaine patches, and also a small number of hydrocodone-acetaminophen tablets.  Follow-up with PCP.  Final Clinical Impression(s) / ED Diagnoses Final diagnoses:  Acute idiopathic gout of left knee    Rx / DC Orders ED Discharge Orders         Ordered    predniSONE (DELTASONE) 50 MG tablet  Daily        02/08/20 0349    HYDROcodone-acetaminophen (NORCO) 5-325 MG tablet  Every 4 hours PRN        02/08/20 0349    Lidocaine (HM LIDOCAINE PATCH) 4 % PTCH  Every 12 hours        02/08/20 0626           Delora Fuel, MD 94/85/46 [phone]

## 2020-02-08 NOTE — ED Triage Notes (Signed)
Left knee pain and swelling since yesterday. Thinks its gout, hx of same. Tylenol not effective.

## 2020-02-11 DIAGNOSIS — E785 Hyperlipidemia, unspecified: Secondary | ICD-10-CM | POA: Diagnosis not present

## 2020-02-11 DIAGNOSIS — R791 Abnormal coagulation profile: Secondary | ICD-10-CM | POA: Diagnosis not present

## 2020-02-11 DIAGNOSIS — I480 Paroxysmal atrial fibrillation: Secondary | ICD-10-CM | POA: Diagnosis not present

## 2020-02-11 DIAGNOSIS — K5730 Diverticulosis of large intestine without perforation or abscess without bleeding: Secondary | ICD-10-CM | POA: Diagnosis not present

## 2020-02-11 DIAGNOSIS — I10 Essential (primary) hypertension: Secondary | ICD-10-CM | POA: Diagnosis not present

## 2020-02-11 DIAGNOSIS — I4891 Unspecified atrial fibrillation: Secondary | ICD-10-CM | POA: Diagnosis not present

## 2020-02-11 DIAGNOSIS — K922 Gastrointestinal hemorrhage, unspecified: Secondary | ICD-10-CM | POA: Diagnosis not present

## 2020-02-11 DIAGNOSIS — Z86010 Personal history of colonic polyps: Secondary | ICD-10-CM | POA: Diagnosis not present

## 2020-02-11 DIAGNOSIS — Z743 Need for continuous supervision: Secondary | ICD-10-CM | POA: Diagnosis not present

## 2020-02-11 DIAGNOSIS — E1165 Type 2 diabetes mellitus with hyperglycemia: Secondary | ICD-10-CM | POA: Diagnosis not present

## 2020-02-11 DIAGNOSIS — Z79899 Other long term (current) drug therapy: Secondary | ICD-10-CM | POA: Diagnosis not present

## 2020-02-11 DIAGNOSIS — Z5181 Encounter for therapeutic drug level monitoring: Secondary | ICD-10-CM | POA: Diagnosis not present

## 2020-02-11 DIAGNOSIS — I714 Abdominal aortic aneurysm, without rupture: Secondary | ICD-10-CM | POA: Diagnosis not present

## 2020-02-11 DIAGNOSIS — Z7952 Long term (current) use of systemic steroids: Secondary | ICD-10-CM | POA: Diagnosis not present

## 2020-02-11 DIAGNOSIS — K625 Hemorrhage of anus and rectum: Secondary | ICD-10-CM | POA: Diagnosis not present

## 2020-02-11 DIAGNOSIS — Z7901 Long term (current) use of anticoagulants: Secondary | ICD-10-CM | POA: Diagnosis not present

## 2020-02-11 DIAGNOSIS — R58 Hemorrhage, not elsewhere classified: Secondary | ICD-10-CM | POA: Diagnosis not present

## 2020-02-11 DIAGNOSIS — D696 Thrombocytopenia, unspecified: Secondary | ICD-10-CM | POA: Diagnosis not present

## 2020-02-12 DIAGNOSIS — I10 Essential (primary) hypertension: Secondary | ICD-10-CM | POA: Diagnosis not present

## 2020-02-12 DIAGNOSIS — K625 Hemorrhage of anus and rectum: Secondary | ICD-10-CM | POA: Insufficient documentation

## 2020-02-12 DIAGNOSIS — Z7901 Long term (current) use of anticoagulants: Secondary | ICD-10-CM | POA: Diagnosis not present

## 2020-02-12 DIAGNOSIS — I4891 Unspecified atrial fibrillation: Secondary | ICD-10-CM | POA: Diagnosis not present

## 2020-02-12 DIAGNOSIS — K922 Gastrointestinal hemorrhage, unspecified: Secondary | ICD-10-CM | POA: Diagnosis not present

## 2020-02-12 DIAGNOSIS — K648 Other hemorrhoids: Secondary | ICD-10-CM | POA: Diagnosis not present

## 2020-02-12 DIAGNOSIS — K5730 Diverticulosis of large intestine without perforation or abscess without bleeding: Secondary | ICD-10-CM | POA: Diagnosis not present

## 2020-02-12 DIAGNOSIS — I714 Abdominal aortic aneurysm, without rupture: Secondary | ICD-10-CM | POA: Diagnosis not present

## 2020-02-12 DIAGNOSIS — I480 Paroxysmal atrial fibrillation: Secondary | ICD-10-CM | POA: Diagnosis not present

## 2020-02-12 DIAGNOSIS — E785 Hyperlipidemia, unspecified: Secondary | ICD-10-CM | POA: Diagnosis not present

## 2020-02-13 DIAGNOSIS — K648 Other hemorrhoids: Secondary | ICD-10-CM | POA: Diagnosis not present

## 2020-02-13 DIAGNOSIS — T82330A Leakage of aortic (bifurcation) graft (replacement), initial encounter: Secondary | ICD-10-CM | POA: Diagnosis not present

## 2020-02-13 DIAGNOSIS — E785 Hyperlipidemia, unspecified: Secondary | ICD-10-CM | POA: Diagnosis not present

## 2020-02-13 DIAGNOSIS — K625 Hemorrhage of anus and rectum: Secondary | ICD-10-CM | POA: Diagnosis not present

## 2020-02-13 DIAGNOSIS — Z7901 Long term (current) use of anticoagulants: Secondary | ICD-10-CM | POA: Diagnosis not present

## 2020-02-13 DIAGNOSIS — D696 Thrombocytopenia, unspecified: Secondary | ICD-10-CM | POA: Diagnosis not present

## 2020-02-13 DIAGNOSIS — I10 Essential (primary) hypertension: Secondary | ICD-10-CM | POA: Diagnosis not present

## 2020-02-14 ENCOUNTER — Other Ambulatory Visit: Payer: Self-pay | Admitting: Interventional Radiology

## 2020-02-14 DIAGNOSIS — I9789 Other postprocedural complications and disorders of the circulatory system, not elsewhere classified: Secondary | ICD-10-CM

## 2020-02-18 ENCOUNTER — Other Ambulatory Visit: Payer: Self-pay

## 2020-02-19 ENCOUNTER — Ambulatory Visit (INDEPENDENT_AMBULATORY_CARE_PROVIDER_SITE_OTHER): Payer: Medicare Other | Admitting: Family

## 2020-02-19 ENCOUNTER — Encounter: Payer: Self-pay | Admitting: Family

## 2020-02-19 VITALS — BP 136/74 | HR 64 | Temp 97.1°F | Ht 62.0 in | Wt 164.0 lb

## 2020-02-19 DIAGNOSIS — M25562 Pain in left knee: Secondary | ICD-10-CM | POA: Diagnosis not present

## 2020-02-19 LAB — URIC ACID: Uric Acid, Serum: 5.3 mg/dL (ref 2.4–7.0)

## 2020-02-19 MED ORDER — TRAMADOL HCL 50 MG PO TABS: 50.0000 mg | ORAL_TABLET | Freq: Two times a day (BID) | ORAL | 0 refills | Status: DC | PRN

## 2020-02-19 NOTE — Patient Instructions (Signed)
 Gout  Gout is painful swelling of your joints. Gout is a type of arthritis. It is caused by having too much uric acid in your body. Uric acid is a chemical that is made when your body breaks down substances called purines. If your body has too much uric acid, sharp crystals can form and build up in your joints. This causes pain and swelling. Gout attacks can happen quickly and be very painful (acute gout). Over time, the attacks can affect more joints and happen more often (chronic gout). What are the causes?  Too much uric acid in your blood. This can happen because: ? Your kidneys do not remove enough uric acid from your blood. ? Your body makes too much uric acid. ? You eat too many foods that are high in purines. These foods include organ meats, some seafood, and beer.  Trauma or stress. What increases the risk?  Having a family history of gout.  Being female and middle-aged.  Being female and having gone through menopause.  Being very overweight (obese).  Drinking alcohol, especially beer.  Not having enough water in the body (being dehydrated).  Losing weight too quickly.  Having an organ transplant.  Having lead poisoning.  Taking certain medicines.  Having kidney disease.  Having a skin condition called psoriasis. What are the signs or symptoms? An attack of acute gout usually happens in just one joint. The most common place is the big toe. Attacks often start at night. Other joints that may be affected include joints of the feet, ankle, knee, fingers, wrist, or elbow. Symptoms of an attack may include:  Very bad pain.  Warmth.  Swelling.  Stiffness.  Shiny, red, or purple skin.  Tenderness. The affected joint may be very painful to touch.  Chills and fever. Chronic gout may cause symptoms more often. More joints may be involved. You may also have white or yellow lumps (tophi) on your hands or feet or in other areas near your joints. How is this  treated?  Treatment for this condition has two phases: treating an acute attack and preventing future attacks.  Acute gout treatment may include: ? NSAIDs. ? Steroids. These are taken by mouth or injected into a joint. ? Colchicine. This medicine relieves pain and swelling. It can be given by mouth or through an IV tube.  Preventive treatment may include: ? Taking small doses of NSAIDs or colchicine daily. ? Using a medicine that reduces uric acid levels in your blood. ? Making changes to your diet. You may need to see a food expert (dietitian) about what to eat and drink to prevent gout. Follow these instructions at home: During a gout attack   If told, put ice on the painful area: ? Put ice in a plastic bag. ? Place a towel between your skin and the bag. ? Leave the ice on for 20 minutes, 2-3 times a day.  Raise (elevate) the painful joint above the level of your heart as often as you can.  Rest the joint as much as possible. If the joint is in your leg, you may be given crutches.  Follow instructions from your doctor about what you cannot eat or drink. Avoiding future gout attacks  Eat a low-purine diet. Avoid foods and drinks such as: ? Liver. ? Kidney. ? Anchovies. ? Asparagus. ? Herring. ? Mushrooms. ? Mussels. ? Beer.  Stay at a healthy weight. If you want to lose weight, talk with your doctor. Do not lose   weight too fast.  Start or continue an exercise plan as told by your doctor. Eating and drinking  Drink enough fluids to keep your pee (urine) pale yellow.  If you drink alcohol: ? Limit how much you use to:  0-1 drink a day for women.  0-2 drinks a day for men. ? Be aware of how much alcohol is in your drink. In the [place] one drink equals one 12 oz bottle of beer (355 mL), one 5 oz glass of wine (148 mL), or one 1 oz glass of hard liquor (44 mL). General instructions  Take over-the-counter and prescription medicines only as told by your doctor.  Do  not drive or use heavy machinery while taking prescription pain medicine.  Return to your normal activities as told by your doctor. Ask your doctor what activities are safe for you.  Keep all follow-up visits as told by your doctor. This is important. Contact a doctor if:  You have another gout attack.  You still have symptoms of a gout attack after 10 days of treatment.  You have problems (side effects) because of your medicines.  You have chills or a fever.  You have burning pain when you pee (urinate).  You have pain in your lower back or belly. Get help right away if:  You have very bad pain.  Your pain cannot be controlled.  You cannot pee. Summary  Gout is painful swelling of the joints.  The most common site of pain is the big toe, but it can affect other joints.  Medicines and avoiding some foods can help to prevent and treat gout attacks. This information is not intended to replace advice given to you by your health care provider. Make sure you discuss any questions you have with your health care provider. Document Revised: 08/24/2017 Document Reviewed: 08/24/2017 Elsevier Patient Education  2020 Elsevier Inc.  

## 2020-02-20 ENCOUNTER — Telehealth: Payer: Self-pay | Admitting: Family Medicine

## 2020-02-20 DIAGNOSIS — E119 Type 2 diabetes mellitus without complications: Secondary | ICD-10-CM | POA: Diagnosis not present

## 2020-02-20 DIAGNOSIS — I4891 Unspecified atrial fibrillation: Secondary | ICD-10-CM | POA: Diagnosis not present

## 2020-02-20 DIAGNOSIS — Z7901 Long term (current) use of anticoagulants: Secondary | ICD-10-CM | POA: Diagnosis not present

## 2020-02-20 DIAGNOSIS — Z5181 Encounter for therapeutic drug level monitoring: Secondary | ICD-10-CM | POA: Diagnosis not present

## 2020-02-20 NOTE — Telephone Encounter (Signed)
I will defer to Dr. Ethelene Hal when he returns to office, since he is more familiar with her history.

## 2020-02-20 NOTE — Telephone Encounter (Signed)
Patients daughter came into the office to speak to the staff. She said that patient was seen at [facility] and they want patient to switch from Warfarin to Eloquis (if approved by PCP).  Please call daughter [name] at [phone] if you have any questions.

## 2020-02-20 NOTE — Telephone Encounter (Signed)
Please see message and advise.  Thank you. ° °

## 2020-02-21 ENCOUNTER — Other Ambulatory Visit: Payer: Self-pay | Admitting: Surgery

## 2020-02-21 ENCOUNTER — Other Ambulatory Visit: Payer: Self-pay

## 2020-02-21 DIAGNOSIS — I9789 Other postprocedural complications and disorders of the circulatory system, not elsewhere classified: Secondary | ICD-10-CM

## 2020-02-24 NOTE — Progress Notes (Signed)
Established Patient Office Visit  Subjective:  Patient ID: Chelsea Dean, female    DOB: [birth year]  Age: 87 y.o. MRN: [id number]  CC:  Chief Complaint  Patient presents with  . Hospitalization Follow-up    Hospital follow up on gout left leg, still very painful need medication for this.     HPI Chelsea Dean presents for a hospital follow-up of Gout in her left knee that has been very painful. She has been taking Hydrocodone that helps. She reports the knee being red, warm and very swollen whic has improved since her ED visit. Pain worse with walking, 10/10 pain. Ambulating with assistance.She reports trying voltarn gel and lidocaine patches that did not help. Patient was also seen at the ED on 02/11/20. She was found to have a slow leaking AAA. She was referred to vascular surgery and has an appointment coming up.   Past Medical History:  Diagnosis Date  . A-fib ([place]) 10/19/2016  . Abdominal aortic aneurysm (AAA) without rupture ([place]) 09/15/2013  . Acute idiopathic gout of right foot   . AKI (acute kidney injury) ([place]) 10/18/2016  . Anticoagulated on Coumadin   . Arthritis   . Diabetes mellitus without complication ([place])   . Diverticular disease of large intestine 04/16/2013  . Diverticulosis   . DM2 (diabetes mellitus, type 2) ([place]) 10/19/2016  . Dysrhythmia    Atrial fib  . History of colon polyps 04/16/2013  . Hx of colonic polyps   . Hyperlipidemia   . Hypertension   . Long term current use of anticoagulant therapy 02/05/2015  . Lumbar radiculopathy 03/28/2014  . Microalbuminuria 04/16/2013  . Osteoarthritis 04/16/2013  . Osteoarthritis of cervical spine 03/28/2014  . Pelvic mass 10/19/2016  . Right renal mass   . SBO (small bowel obstruction) ([place]) 10/18/2016  . Thrombocytopenia ([place]) 04/16/2013   Overview:  referred to hematology 01/2104    Past Surgical History:  Procedure Laterality Date  . ABDOMINAL AORTIC ANEURYSM REPAIR    . ABDOMINAL HYSTERECTOMY    . IR GENERIC HISTORICAL   07/01/2015   IR RADIOLOGIST EVAL & MGMT 07/01/2015 [facility] INTERV RAD    Family History  Problem Relation Age of Onset  . Cancer Sister        Rectal and Stomach    Social History   Socioeconomic History  . Marital status: Married    Spouse name: Not on file  . Number of children: Not on file  . Years of education: Not on file  . Highest education level: Not on file  Occupational History  . Occupation: retired [employer]  . Smoking status: Never Smoker  . Smokeless tobacco: Current User    Types: Snuff  Vaping Use  . Vaping Use: Never used  Substance and Sexual Activity  . Alcohol use: No    Alcohol/week: 0.0 standard drinks  . Drug use: No  . Sexual activity: Not on file  Other Topics Concern  . Not on file  Social History Narrative   Admitted to [facility] 10/27/16   Married   Use snuff   Alcohol none   DNR   Social Determinants of Health   Financial Resource Strain: Low Risk   . Difficulty of Paying Living Expenses: Not hard at all  Food Insecurity: No Food Insecurity  . Worried About Charity fundraiser in the Last Year: Never true  . Ran Out of Food in the Last Year: Never true  Transportation Needs:  No Transportation Needs  . Lack of Transportation (Medical): No  . Lack of Transportation (Non-Medical): No  Physical Activity: Not on file  Stress: Not on file  Social Connections: Not on file  Intimate Partner Violence: Not on file    Outpatient Medications Prior to Visit  Medication Sig Dispense Refill  . amLODipine (NORVASC) 10 MG tablet TAKE 1 TABLET BY MOUTH EVERY DAY FOR BLOOD PRESSURE 90 tablet 1  . apixaban (ELIQUIS) 5 MG TABS tablet Take by mouth.    Marland Kitchen atorvastatin (LIPITOR) 10 MG tablet Take 1 tablet (10 mg total) by mouth daily. 90 tablet 3  . diclofenac sodium (VOLTAREN) 1 % GEL Apply 2 g topically daily as needed (KNEE PAIN).    Marland Kitchen glipiZIDE (GLUCOTROL XL) 10 MG 24 hr tablet TAKE 1 TABLET(10 MG) BY MOUTH DAILY WITH  BREAKFAST 90 tablet 2  . losartan (COZAAR) 100 MG tablet TAKE 1 TABLET BY MOUTH EVERY DAY 90 tablet 1  . metoprolol tartrate (LOPRESSOR) 100 MG tablet TAKE 1 TABLET BY MOUTH 2 TIMES A DAY 180 tablet 1  . polyethylene glycol (MIRALAX / GLYCOLAX) packet Take 17 g by mouth daily. 14 each 0  . predniSONE (DELTASONE) 50 MG tablet Take 1 tablet (50 mg total) by mouth daily. 5 tablet 0  . warfarin (COUMADIN) 2.5 MG tablet Take 2.5 mg by mouth daily. 2.5 mg daily except for Tuesday Thursday Saturday patient takes one half tablet (1.25 mg)    . HYDROcodone-acetaminophen (NORCO) 5-325 MG tablet Take 1 tablet by mouth every 4 (four) hours as needed for moderate pain. 10 tablet 0  . diphenhydrAMINE (BENADRYL) 50 MG tablet Take 1 tablet (50 mg total) by mouth as directed for 1 dose. Take Benadryl 50 mg po 1 hr prior to CT (app't 09/05/2017) (Patient not taking: Reported on 02/19/2020) 1 tablet 0  . Lidocaine (HM LIDOCAINE PATCH) 4 % PTCH Apply 1 patch topically every 12 (twelve) hours. (Patient not taking: Reported on 02/19/2020) 14 patch 0   No facility-administered medications prior to visit.    Allergies  Allergen Reactions  . Ace Inhibitors   . Contrast Media [Iodinated Diagnostic Agents]   . Nsaids     ROS Review of Systems  Constitutional: Negative.   Respiratory: Negative.   Cardiovascular: Negative.   Endocrine: Negative.   Genitourinary: Negative.   Musculoskeletal: Positive for joint swelling.       Right knee pain/swelling  Skin: Negative.   Neurological: Positive for weakness. Negative for dizziness, light-headedness and numbness.  Hematological: Negative.   Psychiatric/Behavioral: Negative.       Objective:    Physical Exam Vitals reviewed.  Constitutional:      Appearance: Normal appearance.  HENT:     Head: Normocephalic.     Right Ear: Tympanic membrane normal.     Left Ear: Tympanic membrane normal.     Nose: Nose normal.  Cardiovascular:     Rate and Rhythm: Normal  rate and regular rhythm.  Pulmonary:     Effort: Pulmonary effort is normal.     Breath sounds: Normal breath sounds.  Abdominal:     General: Abdomen is flat.     Palpations: Abdomen is soft.  Musculoskeletal:        General: Swelling and tenderness present.     Cervical back: Normal range of motion and neck supple.     Comments: Left knee: No redness or obvious swelling. + crepitus. Normal temperature. Pain with ROM  Skin:    General:  Skin is warm and dry.  Neurological:     General: No focal deficit present.     Mental Status: She is alert and oriented to Dean, place, and time.  Psychiatric:        Mood and Affect: Mood normal.        Behavior: Behavior normal.     BP 136/74   Pulse 64   Temp (!) 97.1 F (36.2 C) (Temporal)   Ht 5\' 2"  (1.575 m)   Wt 164 lb (74.4 kg)   SpO2 98%   BMI 30.00 kg/m  Wt Readings from Last 3 Encounters:  02/19/20 164 lb (74.4 kg)  02/08/20 164 lb (74.4 kg)  09/21/19 171 lb 3.2 oz (77.7 kg)     Health Maintenance Due  Topic Date Due  . FOOT EXAM  Never done  . OPHTHALMOLOGY EXAM  Never done  . TETANUS/TDAP  Never done  . COVID-19 Vaccine (3 - Booster for Moderna series) 11/15/2019    There are no preventive care reminders to display for this patient.  Lab Results  Component Value Date   TSH 3.26 03/02/2017   Lab Results  Component Value Date   WBC 4.8 09/21/2019   HGB 13.5 09/21/2019   HCT 43.2 09/21/2019   MCV 84.4 09/21/2019   PLT 86.0 (L) 09/21/2019   Lab Results  Component Value Date   NA 138 09/21/2019   K 3.7 09/21/2019   CO2 23 09/21/2019   GLUCOSE 118 (H) 09/21/2019   BUN 22 09/21/2019   CREATININE 1.01 09/21/2019   BILITOT 0.6 09/21/2019   ALKPHOS 49 09/21/2019   AST 18 09/21/2019   ALT 12 09/21/2019   PROT 7.4 09/21/2019   ALBUMIN 4.4 09/21/2019   CALCIUM 10.5 09/21/2019   ANIONGAP 6 04/01/2018   GFR 51.45 (L) 09/21/2019   Lab Results  Component Value Date   CHOL 210 (H) 04/19/2018   Lab Results   Component Value Date   HDL 40.50 04/19/2018   Lab Results  Component Value Date   LDLCALC 143 (H) 04/19/2018   Lab Results  Component Value Date   TRIG 134.0 04/19/2018   Lab Results  Component Value Date   CHOLHDL 5 04/19/2018   Lab Results  Component Value Date   HGBA1C 6.5 09/21/2019      Assessment & Plan:   Problem List Items Addressed This Visit   None   Visit Diagnoses    Acute pain of left knee    -  Primary   Relevant Orders   Uric acid (Completed)      Meds ordered this encounter  Medications  . traMADol (ULTRAM) 50 MG tablet    Sig: Take 1 tablet (50 mg total) by mouth every 12 (twelve) hours as needed.    Dispense:  20 tablet    Refill:  0    Follow-up: Return in about 3 months (around 05/19/2020).    Kennyth Arnold, FNP

## 2020-02-25 DIAGNOSIS — H2511 Age-related nuclear cataract, right eye: Secondary | ICD-10-CM | POA: Diagnosis not present

## 2020-02-25 DIAGNOSIS — H43812 Vitreous degeneration, left eye: Secondary | ICD-10-CM | POA: Diagnosis not present

## 2020-03-05 DIAGNOSIS — G96191 Perineural cyst: Secondary | ICD-10-CM | POA: Diagnosis not present

## 2020-03-05 DIAGNOSIS — I9789 Other postprocedural complications and disorders of the circulatory system, not elsewhere classified: Secondary | ICD-10-CM | POA: Diagnosis not present

## 2020-03-05 DIAGNOSIS — K5730 Diverticulosis of large intestine without perforation or abscess without bleeding: Secondary | ICD-10-CM | POA: Diagnosis not present

## 2020-03-05 DIAGNOSIS — N200 Calculus of kidney: Secondary | ICD-10-CM | POA: Diagnosis not present

## 2020-03-05 DIAGNOSIS — N281 Cyst of kidney, acquired: Secondary | ICD-10-CM | POA: Diagnosis not present

## 2020-03-05 DIAGNOSIS — R911 Solitary pulmonary nodule: Secondary | ICD-10-CM | POA: Diagnosis not present

## 2020-03-06 ENCOUNTER — Other Ambulatory Visit: Payer: Self-pay | Admitting: Family Medicine

## 2020-03-06 DIAGNOSIS — E119 Type 2 diabetes mellitus without complications: Secondary | ICD-10-CM

## 2020-03-10 DIAGNOSIS — H2512 Age-related nuclear cataract, left eye: Secondary | ICD-10-CM | POA: Diagnosis not present

## 2020-03-11 ENCOUNTER — Telehealth: Payer: Self-pay | Admitting: Family Medicine

## 2020-03-11 ENCOUNTER — Ambulatory Visit
Admission: RE | Admit: 2020-03-11 | Discharge: 2020-03-11 | Disposition: A | Discharge status: Home / Self Care | Payer: Medicare Other | Source: Ambulatory Visit | Attending: Interventional Radiology | Admitting: Interventional Radiology

## 2020-03-11 ENCOUNTER — Encounter: Payer: Self-pay | Admitting: *Deleted

## 2020-03-11 ENCOUNTER — Other Ambulatory Visit: Payer: Self-pay

## 2020-03-11 DIAGNOSIS — I9789 Other postprocedural complications and disorders of the circulatory system, not elsewhere classified: Secondary | ICD-10-CM

## 2020-03-11 DIAGNOSIS — I70212 Atherosclerosis of native arteries of extremities with intermittent claudication, left leg: Secondary | ICD-10-CM | POA: Diagnosis not present

## 2020-03-11 DIAGNOSIS — T82330A Leakage of aortic (bifurcation) graft (replacement), initial encounter: Secondary | ICD-10-CM | POA: Diagnosis not present

## 2020-03-11 HISTORY — PX: IR RADIOLOGIST EVAL & MGMT: IMG5224

## 2020-03-11 NOTE — Telephone Encounter (Signed)
[facility] is calling to get permission to stop patients Coumadin for 5 days. She states the orders are under the "Notes" tab in Epic. Please call her back at [phone] if you have any questions.

## 2020-03-11 NOTE — Progress Notes (Signed)
Chief Complaint: Patient was seen in follow up remotely today ([facility]) for AAA with history of Type 2 Endoleak at the request of Rhaelyn Giron K.    Referring Physician(s): Graeden Bitner K  History of Present Illness: Chelsea Dean is a 87 y.o. female with a history of abdominal aortic aneurysm previously treated with endovascular aortic repair (Dr. Janet Berlin in January 2012) using a bifurcated Endograft. 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  Initial surveillance showed stability of the excluded aneurysm sac and no further intervention was performed. However, on the most recent CTA of the abdomen and pelvis performed 06/13/2015 the aneurysm sac measures 6.7 x 6.3 cm each is a significant enlargement compared to 5.4 cm in 2012.  She underwent endovascular repair of the type II endoleak on 08/14/2015 with coil embolization of the common trunk of the left and right L4 lumbar arteries.  CT arteriogram of the abdomen and pelvis performed 09/05/2017.  There was a persistent type II endoleak, however the infrarenal abdominal aortic aneurysm sac demonstrates no significant interval growth over the past year dating back to January.  However, compared to more remote prior studies from December 2017 there has been definitive growth over time.  At that time, she elected to pursue continued surveillance rather than intervention.  Follow-up was not performed for some time due to the Covid pandemic.  However, on 03/05/2020 she had a follow-up CT arteriogram.  Persistent type II endoleak with further enlargement of the Endosac to 9.0 cm.  Additionally, she has developed complete occlusion of the left stent graft limb and the majority of the left external iliac artery which is new compared to 2019.  Slight interval progression of in-stent stenosis within the right stent graft limb.   Her renal function is normal although she  does have a contrast allergy. She has a history of atrial fibrillation for which she is on chronic Coumadin therapy.  Interestingly, her daughter tells me that she has been experiencing progressive pain in her lower extremities, particularly the left lower extremity over the past year.  She has known arthritis and also has had issues intermittently with gout.  She now lives with her daughter and requires a walker to ambulate.  Her daughter has also noted that her left lower extremity is now significantly colder than the right.  This has been noticeable since just after Christmas.   Past Medical History:  Diagnosis Date  . A-fib ([place]) 10/19/2016  . Abdominal aortic aneurysm (AAA) without rupture ([place]) 09/15/2013  . Acute idiopathic gout of right foot   . AKI (acute kidney injury) ([place]) 10/18/2016  . Anticoagulated on Coumadin   . Arthritis   . Diabetes mellitus without complication ([place])   . Diverticular disease of large intestine 04/16/2013  . Diverticulosis   . DM2 (diabetes mellitus, type 2) ([place]) 10/19/2016  . Dysrhythmia    Atrial fib  . History of colon polyps 04/16/2013  . Hx of colonic polyps   . Hyperlipidemia   . Hypertension   . Long term current use of anticoagulant therapy 02/05/2015  . Lumbar radiculopathy 03/28/2014  . Microalbuminuria 04/16/2013  . Osteoarthritis 04/16/2013  . Osteoarthritis of cervical spine 03/28/2014  . Pelvic mass 10/19/2016  . Right renal mass   . SBO (small bowel obstruction) ([place]) 10/18/2016  . Thrombocytopenia ([place]) 04/16/2013   Overview:  referred to hematology 01/2104    Past Surgical History:  Procedure Laterality Date  .  ABDOMINAL AORTIC ANEURYSM REPAIR    . ABDOMINAL HYSTERECTOMY    . IR GENERIC HISTORICAL  07/01/2015   IR RADIOLOGIST EVAL & MGMT 07/01/2015 [facility] INTERV RAD    Allergies: Ace inhibitors, Contrast media [iodinated diagnostic agents], and Nsaids  Medications: Prior to Admission medications   Medication Sig Start Date End Date Taking?  Authorizing Provider  amLODipine (NORVASC) 10 MG tablet TAKE 1 TABLET BY MOUTH EVERY DAY FOR BLOOD PRESSURE 01/24/20   [name], MD  apixaban Arne [place]) 5 MG TABS tablet Take by mouth. 02/13/20 03/14/20  [provider]  atorvastatin (LIPITOR) 10 MG tablet Take 1 tablet (10 mg total) by mouth daily. 03/26/19   [name], MD  diclofenac sodium (VOLTAREN) 1 % GEL Apply 2 g topically daily as needed (KNEE PAIN).    [provider]  diphenhydrAMINE (BENADRYL) 50 MG tablet Take 1 tablet (50 mg total) by mouth as directed for 1 dose. Take Benadryl 50 mg po 1 hr prior to CT (app't 09/05/2017) Patient not taking: Reported on 02/19/2020 09/05/17   Criselda Peaches, MD  glipiZIDE (GLUCOTROL XL) 10 MG 24 hr tablet TAKE 1 TABLET BY MOUTH EVERY DAY WITH BREAKFAST 03/06/20   Dutch Quint B, FNP  Lidocaine (HM LIDOCAINE PATCH) 4 % PTCH Apply 1 patch topically every 12 (twelve) hours. Patient not taking: Reported on 09/21/7670 09/47/09   Delora Fuel, MD  losartan (COZAAR) 100 MG tablet TAKE 1 TABLET BY MOUTH EVERY DAY 11/26/19   [name], MD  metoprolol tartrate (LOPRESSOR) 100 MG tablet TAKE 1 TABLET BY MOUTH 2 TIMES A DAY 01/24/20   [name], MD  polyethylene glycol [facility] Floria Raveling) packet Take 17 g by mouth daily. 10/28/16   Dessa Phi, DO  predniSONE (DELTASONE) 50 MG tablet Take 1 tablet (50 mg total) by mouth daily. 62/83/66   Delora Fuel, MD  traMADol (ULTRAM) 50 MG tablet Take 1 tablet (50 mg total) by mouth every 12 (twelve) hours as needed. 02/19/20   Dutch Quint B, FNP  warfarin (COUMADIN) 2.5 MG tablet Take 2.5 mg by mouth daily. 2.5 mg daily except for Tuesday Thursday Saturday patient takes one half tablet (1.25 mg)    [provider]     Family History  Problem Relation Age of Onset  . Cancer Sister        Rectal and Stomach    Social History   Socioeconomic History  . Marital status: Married    Spouse name:  Not on file  . Number of children: Not on file  . Years of education: Not on file  . Highest education level: Not on file  Occupational History  . Occupation: retired [employer]  . Smoking status: Never Smoker  . Smokeless tobacco: Current User    Types: Snuff  Vaping Use  . Vaping Use: Never used  Substance and Sexual Activity  . Alcohol use: No    Alcohol/week: 0.0 standard drinks  . Drug use: No  . Sexual activity: Not on file  Other Topics Concern  . Not on file  Social History Narrative   Admitted to [facility] 10/27/16   Married   Use snuff   Alcohol none   DNR   Social Determinants of Health   Financial Resource Strain: Low Risk   . Difficulty of Paying Living Expenses: Not hard at all  Food Insecurity: No Food Insecurity  . Worried About Charity fundraiser  in the Last Year: Never true  . Ran Out of Food in the Last Year: Never true  Transportation Needs: No Transportation Needs  . Lack of Transportation (Medical): No  . Lack of Transportation (Non-Medical): No  Physical Activity: Not on file  Stress: Not on file  Social Connections: Not on file    Review of Systems  Review of Systems: A 12 point ROS discussed and pertinent positives are indicated in the HPI above.  All other systems are negative.  Physical Exam No direct physical exam was performed (except for noted visual exam findings with Video Visits).    Vital Signs: There were no vitals taken for this visit.  Imaging: No results found.  Labs:  CBC: Recent Labs    03/26/19 1158 09/21/19 1040  WBC 5.3 4.8  HGB 14.0 13.5  HCT 44.8 43.2  PLT 104.0* 86.0*    COAGS: No results for input(s): INR, APTT in the last 8760 hours.  BMP: Recent Labs    03/26/19 1158 09/21/19 1040  NA 139 138  K 4.6 3.7  CL 102 106  CO2 29 23  GLUCOSE 108* 118*  BUN 21 22  CALCIUM 10.8* 10.5  CREATININE 1.04 1.01    LIVER FUNCTION TESTS: Recent Labs    09/21/19 1040   BILITOT 0.6  AST 18  ALT 12  ALKPHOS 49  PROT 7.4  ALBUMIN 4.4    TUMOR MARKERS: No results for input(s): AFPTM, CEA, CA199, CHROMGRNA in the last 8760 hours.  Assessment and Plan:  87 year old female who had been doing quite well after undergoing endovascular endoleak repair in June 2017.  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  This is likely due to increased collateralization through the lumbar arteries to fill the left internal iliac artery.  Additionally, she has developed fairly significant left greater than right lower extremity claudication since our last visit and now requires a walker when previously she could ambulate well with just a cane.  After a long discussion with the patient's daughter, we are in agreement that attempting to restore direct flow to the left lower extremity and repair the significant stenosis in the right iliac limb would provide a significant increase in quality of life.  Additionally, patient requires another attempt at endoleak repair which could be performed at the same time.  She and her daughter are in agreement and desire to proceed.  They understand that the potential risks include bleeding, failure to recanalize the occluded left iliac limb, and distal embolization resulting in worsening claudication or distal ischemia.  1.)  Schedule for bilateral lower extremity arteriogram with anticipated stent graft placement and attempted endovascular repair of type II endoleak to be performed at [facility].  Patient will have to stop her Coumadin for 5 days prior to the procedure.  She will also require premedication for her contrast allergy.    Electronically Signed: Criselda Peaches 03/11/2020, 12:58 PM   I spent a total of 25 Minutes in remote  clinical consultation, greater than 50% of which was  counseling/coordinating care for AAA complicated by type 2 endoleak, left leg claudication.    Visit type: Audio only (telephone). Audio (no video) only due to patient preference. Alternative for in-person consultation at [facility], [address] [place], [place], [place]. This visit type was conducted due to national recommendations for restrictions regarding the COVID-19  Pandemic (e.g. social distancing).  This format is felt to be most appropriate for this patient at this time.  All issues noted in this document were discussed and addressed.

## 2020-03-12 NOTE — Telephone Encounter (Signed)
Please advise message below  °

## 2020-03-12 NOTE — Telephone Encounter (Signed)
Spoke with Chelsea Dean who verbally understood message below and states that she will make sure the hospital is also aware of the message. Per Chelsea Dean she will view message in epic as well.

## 2020-03-18 ENCOUNTER — Other Ambulatory Visit (HOSPITAL_COMMUNITY): Payer: Self-pay | Admitting: Interventional Radiology

## 2020-03-18 DIAGNOSIS — I739 Peripheral vascular disease, unspecified: Secondary | ICD-10-CM

## 2020-03-18 DIAGNOSIS — I714 Abdominal aortic aneurysm, without rupture, unspecified: Secondary | ICD-10-CM

## 2020-03-21 DIAGNOSIS — R791 Abnormal coagulation profile: Secondary | ICD-10-CM | POA: Diagnosis not present

## 2020-03-21 DIAGNOSIS — Z5181 Encounter for therapeutic drug level monitoring: Secondary | ICD-10-CM | POA: Diagnosis not present

## 2020-03-21 DIAGNOSIS — Z7901 Long term (current) use of anticoagulants: Secondary | ICD-10-CM | POA: Diagnosis not present

## 2020-03-24 ENCOUNTER — Ambulatory Visit: Payer: Medicare Other | Admitting: Family Medicine

## 2020-03-24 ENCOUNTER — Other Ambulatory Visit: Payer: Self-pay

## 2020-03-25 ENCOUNTER — Encounter: Payer: Self-pay | Admitting: Family Medicine

## 2020-03-25 ENCOUNTER — Ambulatory Visit (INDEPENDENT_AMBULATORY_CARE_PROVIDER_SITE_OTHER): Payer: Medicare Other | Admitting: Family Medicine

## 2020-03-25 VITALS — BP 104/68 | HR 63 | Temp 96.1°F | Ht 62.0 in | Wt 165.0 lb

## 2020-03-25 DIAGNOSIS — I4811 Longstanding persistent atrial fibrillation: Secondary | ICD-10-CM | POA: Diagnosis not present

## 2020-03-25 DIAGNOSIS — Z7901 Long term (current) use of anticoagulants: Secondary | ICD-10-CM

## 2020-03-25 DIAGNOSIS — N184 Chronic kidney disease, stage 4 (severe): Secondary | ICD-10-CM

## 2020-03-25 NOTE — Progress Notes (Signed)
[facility] [address] [place] [address] Dept: [phone] Dept Fax: [phone]  Acute Office Visit  Subjective:    Patient ID: Chelsea Dean, female    DOB: 1927-05-21, 88 y.o..   MRN: [id number]  Chief Complaint  Patient presents with  . Follow-up    Follow up on medication would like to change coumadin to eliquis.     History of Present Illness:  Patient is in today for discussion of her anticoagulant management. She has a history of atrial fibrillation. She has been on long-term warfarin therapy, managed through [facility]'s anticoagulation clinic. In Dec., she was admitted for observation at [facility]. During the hospitalization, a recommendation was made to switch her from warfarin to a DOAC (apixiban). To date, she has not made the transition. She is int he clinic today with her daughter. She notes that they have obtained a prescription for Eliquis. Chelsea Dean is scheduled next week for radiology procedures to evaluate both a renal artery aneurysm (with a known slow leak) and femoral/iliac artery patency.  Past Medical History: Patient Active Problem List   Diagnosis Date Noted  . Stage 4 chronic kidney disease ([place]) 09/21/2019  . Elevated LDL cholesterol level 07/19/2018  . Need for influenza vaccination 12/28/2017  . Chronic pain of right knee 10/19/2017  . Stage 3 chronic kidney disease ([place]) 07/25/2017  . Hypomagnesemia 03/31/2017  . 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/31/2017  . Abnormal urinalysis 03/16/2017  . Hypokalemia 10/31/2016  . Lactic acidosis 10/31/2016  . Atrial fibrillation with RVR ([place]) 10/31/2016  . Elevated liver enzymes 10/31/2016  . Anticoagulated on Coumadin 10/31/2016  . Elevated INR 10/31/2016  . Hyperlipidemia 10/31/2016  . Essential hypertension 10/31/2016  . Acute idiopathic gout of right foot   . A-fib ([place]) 10/19/2016  . Type 2 diabetes mellitus  without complication, without long-term current use of insulin ([place]) 10/19/2016  . Pelvic mass 10/19/2016  . SBO (small bowel obstruction) ([place]) 10/18/2016  . Long term current use of anticoagulant therapy 02/05/2015  . Lumbar radiculopathy 03/28/2014  . Osteoarthritis of cervical spine 03/28/2014  . Abdominal aortic aneurysm (AAA) without rupture ([place]) 09/15/2013  . Diverticular disease of large intestine 04/16/2013  . History of colon polyps 04/16/2013  . Microalbuminuria 04/16/2013  . Osteoarthritis 04/16/2013  . Thrombocytopenia ([place]) 04/16/2013   Past Surgical History:  Procedure Laterality Date  . ABDOMINAL AORTIC ANEURYSM REPAIR    . ABDOMINAL HYSTERECTOMY    . IR GENERIC HISTORICAL  07/01/2015   IR RADIOLOGIST EVAL & MGMT 07/01/2015 [facility] INTERV RAD  . IR RADIOLOGIST EVAL & MGMT  03/11/2020   Family History  Problem Relation Age of Onset  . Cancer Sister        Rectal and Stomach   Outpatient Medications Prior to Visit  Medication Sig Dispense Refill  . amLODipine (NORVASC) 10 MG tablet TAKE 1 TABLET BY MOUTH EVERY DAY FOR BLOOD PRESSURE 90 tablet 1  . atorvastatin (LIPITOR) 10 MG tablet Take 1 tablet (10 mg total) by mouth daily. 90 tablet 3  . diclofenac sodium (VOLTAREN) 1 % GEL Apply 2 g topically daily as needed (KNEE PAIN).    Marland Kitchen glipiZIDE (GLUCOTROL XL) 10 MG 24 hr tablet TAKE 1 TABLET BY MOUTH EVERY DAY WITH BREAKFAST 90 tablet 2  . Lidocaine (HM LIDOCAINE PATCH) 4 % PTCH Apply 1 patch topically every 12 (twelve) hours. 14 patch 0  . losartan (COZAAR) 100 MG tablet TAKE 1 TABLET  BY MOUTH EVERY DAY 90 tablet 1  . metoprolol tartrate (LOPRESSOR) 100 MG tablet TAKE 1 TABLET BY MOUTH 2 TIMES A DAY 180 tablet 1  . warfarin (COUMADIN) 2.5 MG tablet Take 2.5 mg by mouth daily. 2.5 mg daily except for Tuesday Thursday Saturday patient takes one half tablet (1.25 mg)    . apixaban (ELIQUIS) 5 MG TABS tablet Take by mouth.    . diphenhydrAMINE (BENADRYL) 50 MG tablet Take 1  tablet (50 mg total) by mouth as directed for 1 dose. Take Benadryl 50 mg po 1 hr prior to CT (app't 09/05/2017) (Patient not taking: No sig reported) 1 tablet 0  . polyethylene glycol (MIRALAX / GLYCOLAX) packet Take 17 g by mouth daily. (Patient not taking: Reported on 03/25/2020) 14 each 0  . predniSONE (DELTASONE) 50 MG tablet Take 1 tablet (50 mg total) by mouth daily. 5 tablet 0  . traMADol (ULTRAM) 50 MG tablet Take 1 tablet (50 mg total) by mouth every 12 (twelve) hours as needed. 20 tablet 0   No facility-administered medications prior to visit.   Allergies  Allergen Reactions  . Ace Inhibitors   . Contrast Media [Iodinated Diagnostic Agents]   . Nsaids      Objective:   Today's Vitals   03/25/20 1122  BP: 104/68  Pulse: 63  Temp: (!) 96.1 F (35.6 C)  TempSrc: Temporal  SpO2: 94%  Weight: 165 lb (74.8 kg)  Height: 5\' 2"  (1.575 m)   Body mass index is 30.18 kg/m.   General: Well developed, well nourished. No acute distress. Psych: Alert and oriented. Normal mood and affect.  Health Maintenance Due  Topic Date Due  . FOOT EXAM  Never done  . OPHTHALMOLOGY EXAM  Never done  . TETANUS/TDAP  Never done  . COVID-19 Vaccine (3 - Booster for Moderna series) 11/15/2019  . HEMOGLOBIN A1C  03/23/2020     Assessment & Plan:   1. Longstanding persistent atrial fibrillation (HCC) 2. Stage 4 chronic kidney disease ([place]) 3. Long term current use of anticoagulant therapy  Chelsea Dean has a CHA2DS2-VASc score of 6. She has been managed with warfarin. Current recommendations are for use of DOACs in elderly patients with non-valvular AF. I discussed the transition with her and her daughter. Stop Coumadin today and begin Eliquis. Continue bleeding precautions. Check with vascular surgeon related to need to temporarily stop anticoagulants before next week's procedures.  [name], MD

## 2020-03-26 ENCOUNTER — Telehealth: Payer: Self-pay | Admitting: Family Medicine

## 2020-03-26 NOTE — Telephone Encounter (Signed)
Attempted to schedule AWV. Unable to LVM.  1st number mailbox full.  Second number out of service .   Called patient to schedule Annual Wellness Visit.  Please schedule with Nurse Health Advisor Caroleen Hamman, RN at [facility]

## 2020-03-31 ENCOUNTER — Other Ambulatory Visit: Payer: Self-pay | Admitting: Student

## 2020-04-01 ENCOUNTER — Inpatient Hospital Stay (HOSPITAL_COMMUNITY): Payer: Medicare Other | Admitting: Anesthesiology

## 2020-04-01 ENCOUNTER — Other Ambulatory Visit (HOSPITAL_COMMUNITY): Payer: Self-pay | Admitting: Interventional Radiology

## 2020-04-01 ENCOUNTER — Other Ambulatory Visit: Payer: Self-pay

## 2020-04-01 ENCOUNTER — Encounter (HOSPITAL_COMMUNITY): Payer: Self-pay | Admitting: Vascular Surgery

## 2020-04-01 ENCOUNTER — Ambulatory Visit (HOSPITAL_COMMUNITY)
Admission: RE | Admit: 2020-04-01 | Discharge: 2020-04-01 | Disposition: A | Discharge status: Home / Self Care | Payer: Medicare Other | Source: Ambulatory Visit | Attending: Interventional Radiology | Admitting: Interventional Radiology

## 2020-04-01 ENCOUNTER — Inpatient Hospital Stay (HOSPITAL_COMMUNITY)
Admission: AD | Admit: 2020-04-01 | Discharge: 2020-04-02 | DRG: 271 | Disposition: A | Discharge status: Home / Self Care | Payer: Medicare Other | Attending: Vascular Surgery | Admitting: Vascular Surgery

## 2020-04-01 ENCOUNTER — Encounter (HOSPITAL_COMMUNITY): Payer: Self-pay

## 2020-04-01 ENCOUNTER — Encounter (HOSPITAL_COMMUNITY)
Admission: AD | Disposition: A | Discharge status: Home / Self Care | Payer: Self-pay | Source: Home / Self Care | Attending: Vascular Surgery

## 2020-04-01 DIAGNOSIS — I70213 Atherosclerosis of native arteries of extremities with intermittent claudication, bilateral legs: Secondary | ICD-10-CM | POA: Insufficient documentation

## 2020-04-01 DIAGNOSIS — I771 Stricture of artery: Secondary | ICD-10-CM | POA: Diagnosis not present

## 2020-04-01 DIAGNOSIS — Z79899 Other long term (current) drug therapy: Secondary | ICD-10-CM | POA: Diagnosis not present

## 2020-04-01 DIAGNOSIS — I70222 Atherosclerosis of native arteries of extremities with rest pain, left leg: Secondary | ICD-10-CM | POA: Diagnosis not present

## 2020-04-01 DIAGNOSIS — E876 Hypokalemia: Secondary | ICD-10-CM | POA: Diagnosis not present

## 2020-04-01 DIAGNOSIS — I714 Abdominal aortic aneurysm, without rupture, unspecified: Secondary | ICD-10-CM

## 2020-04-01 DIAGNOSIS — Z7984 Long term (current) use of oral hypoglycemic drugs: Secondary | ICD-10-CM | POA: Insufficient documentation

## 2020-04-01 DIAGNOSIS — E785 Hyperlipidemia, unspecified: Secondary | ICD-10-CM | POA: Diagnosis present

## 2020-04-01 DIAGNOSIS — T82330A Leakage of aortic (bifurcation) graft (replacement), initial encounter: Secondary | ICD-10-CM | POA: Diagnosis not present

## 2020-04-01 DIAGNOSIS — I10 Essential (primary) hypertension: Secondary | ICD-10-CM | POA: Diagnosis present

## 2020-04-01 DIAGNOSIS — I743 Embolism and thrombosis of arteries of the lower extremities: Secondary | ICD-10-CM | POA: Diagnosis not present

## 2020-04-01 DIAGNOSIS — I4891 Unspecified atrial fibrillation: Secondary | ICD-10-CM | POA: Diagnosis not present

## 2020-04-01 DIAGNOSIS — Z7901 Long term (current) use of anticoagulants: Secondary | ICD-10-CM

## 2020-04-01 DIAGNOSIS — I745 Embolism and thrombosis of iliac artery: Secondary | ICD-10-CM | POA: Diagnosis present

## 2020-04-01 DIAGNOSIS — I998 Other disorder of circulatory system: Secondary | ICD-10-CM | POA: Diagnosis present

## 2020-04-01 DIAGNOSIS — Z886 Allergy status to analgesic agent status: Secondary | ICD-10-CM | POA: Insufficient documentation

## 2020-04-01 DIAGNOSIS — E1151 Type 2 diabetes mellitus with diabetic peripheral angiopathy without gangrene: Secondary | ICD-10-CM | POA: Diagnosis present

## 2020-04-01 DIAGNOSIS — I70202 Unspecified atherosclerosis of native arteries of extremities, left leg: Secondary | ICD-10-CM | POA: Diagnosis present

## 2020-04-01 DIAGNOSIS — I739 Peripheral vascular disease, unspecified: Secondary | ICD-10-CM

## 2020-04-01 DIAGNOSIS — Z91041 Radiographic dye allergy status: Secondary | ICD-10-CM | POA: Insufficient documentation

## 2020-04-01 DIAGNOSIS — T82858A Stenosis of vascular prosthetic devices, implants and grafts, initial encounter: Secondary | ICD-10-CM | POA: Diagnosis not present

## 2020-04-01 DIAGNOSIS — M10071 Idiopathic gout, right ankle and foot: Secondary | ICD-10-CM | POA: Diagnosis present

## 2020-04-01 HISTORY — PX: IR US GUIDE VASC ACCESS RIGHT: IMG2390

## 2020-04-01 HISTORY — PX: IR AORTAGRAM ABDOMINAL SERIALOGRAM: IMG636

## 2020-04-01 HISTORY — PX: IR ANGIOGRAM SELECTIVE EACH ADDITIONAL VESSEL: IMG667

## 2020-04-01 HISTORY — PX: IR EMBO ARTERIAL NOT HEMORR HEMANG INC GUIDE ROADMAPPING: IMG5448

## 2020-04-01 HISTORY — PX: IR ANGIOGRAM PELVIS SELECTIVE OR SUPRASELECTIVE: IMG661

## 2020-04-01 HISTORY — PX: IR ANGIOGRAM EXTREMITY BILATERAL: IMG653

## 2020-04-01 HISTORY — PX: THROMBECTOMY FEMORAL ARTERY: SHX6406

## 2020-04-01 HISTORY — PX: IR ANGIOGRAM EXTREMITY LEFT: IMG651

## 2020-04-01 HISTORY — PX: IR US GUIDE VASC ACCESS LEFT: IMG2389

## 2020-04-01 HISTORY — PX: IR THROMBECT PRIM MECH INIT (INCLU) MOD SED: IMG2297

## 2020-04-01 LAB — CBC
HCT: 40.5 % (ref 36.0–46.0)
Hemoglobin: 12.5 g/dL (ref 12.0–15.0)
MCH: 26.3 pg (ref 26.0–34.0)
MCHC: 30.9 g/dL (ref 30.0–36.0)
MCV: 85.1 fL (ref 80.0–100.0)
Platelets: 92 10*3/uL — ABNORMAL LOW (ref 150–400)
RBC: 4.76 MIL/uL (ref 3.87–5.11)
RDW: 15.9 % — ABNORMAL HIGH (ref 11.5–15.5)
WBC: 4.6 10*3/uL (ref 4.0–10.5)
nRBC: 0 % (ref 0.0–0.2)

## 2020-04-01 LAB — GLUCOSE, CAPILLARY
Glucose-Capillary: 144 mg/dL — ABNORMAL HIGH (ref 70–99)
Glucose-Capillary: 171 mg/dL — ABNORMAL HIGH (ref 70–99)
Glucose-Capillary: 155 mg/dL — ABNORMAL HIGH (ref 70–99)

## 2020-04-01 LAB — PROTIME-INR
INR: 1.4 — ABNORMAL HIGH (ref 0.8–1.2)
Prothrombin Time: 16.3 seconds — ABNORMAL HIGH (ref 11.4–15.2)

## 2020-04-01 LAB — BASIC METABOLIC PANEL
Anion gap: 12 (ref 5–15)
BUN: 23 mg/dL (ref 8–23)
CO2: 23 mmol/L (ref 22–32)
Calcium: 10.6 mg/dL — ABNORMAL HIGH (ref 8.9–10.3)
Chloride: 108 mmol/L (ref 98–111)
Creatinine, Ser: 1.06 mg/dL — ABNORMAL HIGH (ref 0.44–1.00)
GFR, Estimated: 50 mL/min — ABNORMAL LOW (ref >60)
Glucose, Bld: 193 mg/dL — ABNORMAL HIGH (ref 70–99)
Potassium: 4.1 mmol/L (ref 3.5–5.1)
Sodium: 143 mmol/L (ref 135–145)

## 2020-04-01 LAB — ABO/RH: ABO/RH(D): O POS

## 2020-04-01 LAB — APTT: aPTT: 32 seconds (ref 24–36)

## 2020-04-01 SURGERY — THROMBECTOMY, ARTERY, FEMORAL
Anesthesia: Monitor Anesthesia Care | Site: Leg Lower | Laterality: Left

## 2020-04-01 MED ORDER — ATORVASTATIN CALCIUM 10 MG PO TABS
10.0000 mg | ORAL_TABLET | Freq: Every day | ORAL | Status: DC
  Administered 2020-04-02: 10 mg via ORAL
  Filled 2020-04-01: qty 1

## 2020-04-01 MED ORDER — AMLODIPINE BESYLATE 10 MG PO TABS
10.0000 mg | ORAL_TABLET | Freq: Every day | ORAL | Status: DC
Start: 2020-04-02 — End: 2020-04-02
  Administered 2020-04-02: 10 mg via ORAL
  Filled 2020-04-01: qty 1

## 2020-04-01 MED ORDER — SODIUM CHLORIDE 0.9 % IV SOLN
INTRAVENOUS | Status: AC
  Filled 2020-04-01: qty 1.2

## 2020-04-01 MED ORDER — CHLORHEXIDINE GLUCONATE 0.12 % MT SOLN
15.0000 mL | Freq: Once | OROMUCOSAL | Status: AC
  Filled 2020-04-01: qty 15

## 2020-04-01 MED ORDER — DOCUSATE SODIUM 100 MG PO CAPS
100.0000 mg | ORAL_CAPSULE | Freq: Every day | ORAL | Status: DC
  Administered 2020-04-02: 100 mg via ORAL
  Filled 2020-04-01: qty 1

## 2020-04-01 MED ORDER — OXYCODONE-ACETAMINOPHEN 5-325 MG PO TABS
1.0000 | ORAL_TABLET | ORAL | Status: DC | PRN
  Administered 2020-04-01: 1 via ORAL
  Filled 2020-04-01: qty 1

## 2020-04-01 MED ORDER — FENTANYL CITRATE (PF) 100 MCG/2ML IJ SOLN
INTRAMUSCULAR | Status: AC
  Filled 2020-04-01: qty 2

## 2020-04-01 MED ORDER — HEPARIN SODIUM (PORCINE) 1000 UNIT/ML IJ SOLN
INTRAMUSCULAR | Status: AC
  Filled 2020-04-01: qty 1

## 2020-04-01 MED ORDER — FENTANYL CITRATE (PF) 100 MCG/2ML IJ SOLN
INTRAMUSCULAR | Status: DC | PRN
  Administered 2020-04-01: 100 ug via INTRAVENOUS
  Administered 2020-04-01: 50 ug via INTRAVENOUS

## 2020-04-01 MED ORDER — LOSARTAN POTASSIUM 50 MG PO TABS: 100.0000 mg | ORAL_TABLET | Freq: Every day | ORAL | Status: DC

## 2020-04-01 MED ORDER — HYDRALAZINE HCL 20 MG/ML IJ SOLN: 5.0000 mg | INTRAMUSCULAR | Status: DC | PRN

## 2020-04-01 MED ORDER — LABETALOL HCL 5 MG/ML IV SOLN: 10.0000 mg | INTRAVENOUS | Status: DC | PRN

## 2020-04-01 MED ORDER — GUAIFENESIN-DM 100-10 MG/5ML PO SYRP: 15.0000 mL | ORAL_SOLUTION | ORAL | Status: DC | PRN

## 2020-04-01 MED ORDER — SODIUM CHLORIDE 0.9 % IV SOLN: INTRAVENOUS | Status: DC

## 2020-04-01 MED ORDER — ONDANSETRON HCL 4 MG/2ML IJ SOLN
INTRAMUSCULAR | Status: DC | PRN
  Administered 2020-04-01: 4 mg via INTRAVENOUS

## 2020-04-01 MED ORDER — LIDOCAINE HCL 1 % IJ SOLN
INTRAMUSCULAR | Status: AC
  Filled 2020-04-01: qty 20

## 2020-04-01 MED ORDER — PHENYLEPHRINE HCL-NACL 10-0.9 MG/250ML-% IV SOLN
INTRAVENOUS | Status: DC | PRN
  Administered 2020-04-01: 50 ug/min via INTRAVENOUS
  Administered 2020-04-01: 25 ug/min via INTRAVENOUS

## 2020-04-01 MED ORDER — METOPROLOL TARTRATE 100 MG PO TABS
100.0000 mg | ORAL_TABLET | Freq: Two times a day (BID) | ORAL | Status: DC
  Administered 2020-04-01 – 2020-04-02 (×2): 100 mg via ORAL
  Filled 2020-04-01 (×2): qty 1

## 2020-04-01 MED ORDER — METOPROLOL TARTRATE 5 MG/5ML IV SOLN: 2.0000 mg | INTRAVENOUS | Status: DC | PRN

## 2020-04-01 MED ORDER — IOHEXOL 300 MG/ML  SOLN: 150.0000 mL | Freq: Once | INTRAMUSCULAR | Status: DC | PRN

## 2020-04-01 MED ORDER — HEPARIN SODIUM (PORCINE) 1000 UNIT/ML IJ SOLN
INTRAMUSCULAR | Status: DC | PRN
  Administered 2020-04-01: 7000 [IU] via INTRAVENOUS

## 2020-04-01 MED ORDER — MAGNESIUM SULFATE 2 GM/50ML IV SOLN: 2.0000 g | Freq: Every day | INTRAVENOUS | Status: DC | PRN

## 2020-04-01 MED ORDER — ONDANSETRON HCL 4 MG/2ML IJ SOLN: 4.0000 mg | Freq: Four times a day (QID) | INTRAMUSCULAR | Status: DC | PRN

## 2020-04-01 MED ORDER — SUGAMMADEX SODIUM 200 MG/2ML IV SOLN
INTRAVENOUS | Status: DC | PRN
  Administered 2020-04-01: 200 mg via INTRAVENOUS

## 2020-04-01 MED ORDER — PROPOFOL 10 MG/ML IV BOLUS
INTRAVENOUS | Status: DC | PRN
  Administered 2020-04-01: 70 mg via INTRAVENOUS

## 2020-04-01 MED ORDER — POTASSIUM CHLORIDE CRYS ER 20 MEQ PO TBCR: 20.0000 meq | EXTENDED_RELEASE_TABLET | Freq: Every day | ORAL | Status: DC | PRN

## 2020-04-01 MED ORDER — DEXAMETHASONE SODIUM PHOSPHATE 10 MG/ML IJ SOLN
INTRAMUSCULAR | Status: DC | PRN
  Administered 2020-04-01: 5 mg via INTRAVENOUS

## 2020-04-01 MED ORDER — HEPARIN SODIUM (PORCINE) 1000 UNIT/ML IJ SOLN
INTRAMUSCULAR | Status: AC
  Filled 2020-04-01: qty 2

## 2020-04-01 MED ORDER — CEFAZOLIN SODIUM-DEXTROSE 2-4 GM/100ML-% IV SOLN
2.0000 g | Freq: Three times a day (TID) | INTRAVENOUS | Status: AC
  Administered 2020-04-01 – 2020-04-02 (×2): 2 g via INTRAVENOUS
  Filled 2020-04-01 (×2): qty 100

## 2020-04-01 MED ORDER — PROTAMINE SULFATE 10 MG/ML IV SOLN
INTRAVENOUS | Status: DC | PRN
  Administered 2020-04-01: 20 mg via INTRAVENOUS
  Administered 2020-04-01: 10 mg via INTRAVENOUS
  Administered 2020-04-01: 20 mg via INTRAVENOUS

## 2020-04-01 MED ORDER — CHLORHEXIDINE GLUCONATE 0.12 % MT SOLN
OROMUCOSAL | Status: AC
  Administered 2020-04-01: 15 mL via OROMUCOSAL
  Filled 2020-04-01: qty 15

## 2020-04-01 MED ORDER — FENTANYL CITRATE (PF) 100 MCG/2ML IJ SOLN: 25.0000 ug | INTRAMUSCULAR | Status: DC | PRN

## 2020-04-01 MED ORDER — 0.9 % SODIUM CHLORIDE (POUR BTL) OPTIME
TOPICAL | Status: DC | PRN
  Administered 2020-04-01 (×2): 1000 mL

## 2020-04-01 MED ORDER — PHENOL 1.4 % MT LIQD: 1.0000 | OROMUCOSAL | Status: DC | PRN

## 2020-04-01 MED ORDER — SODIUM CHLORIDE 0.9 % IV SOLN: 500.0000 mL | Freq: Once | INTRAVENOUS | Status: DC | PRN

## 2020-04-01 MED ORDER — FENTANYL CITRATE (PF) 250 MCG/5ML IJ SOLN
INTRAMUSCULAR | Status: AC
  Filled 2020-04-01: qty 5

## 2020-04-01 MED ORDER — CEFAZOLIN SODIUM-DEXTROSE 2-4 GM/100ML-% IV SOLN
INTRAVENOUS | Status: AC
  Filled 2020-04-01: qty 100

## 2020-04-01 MED ORDER — ROCURONIUM BROMIDE 10 MG/ML (PF) SYRINGE
PREFILLED_SYRINGE | INTRAVENOUS | Status: DC | PRN
  Administered 2020-04-01: 50 mg via INTRAVENOUS

## 2020-04-01 MED ORDER — DIAZEPAM 5 MG PO TABS
ORAL_TABLET | ORAL | Status: AC
  Filled 2020-04-01: qty 1

## 2020-04-01 MED ORDER — HEPARIN SODIUM (PORCINE) 5000 UNIT/ML IJ SOLN
5000.0000 [IU] | Freq: Three times a day (TID) | INTRAMUSCULAR | Status: DC
  Administered 2020-04-02: 5000 [IU] via SUBCUTANEOUS

## 2020-04-01 MED ORDER — PANTOPRAZOLE SODIUM 40 MG PO TBEC
40.0000 mg | DELAYED_RELEASE_TABLET | Freq: Every day | ORAL | Status: DC
  Administered 2020-04-02: 40 mg via ORAL
  Filled 2020-04-01: qty 1

## 2020-04-01 MED ORDER — CEFAZOLIN SODIUM-DEXTROSE 2-4 GM/100ML-% IV SOLN
2.0000 g | Freq: Once | INTRAVENOUS | Status: AC
  Administered 2020-04-01: 2 g via INTRAVENOUS
  Filled 2020-04-01: qty 100

## 2020-04-01 MED ORDER — DIAZEPAM 5 MG PO TABS
5.0000 mg | ORAL_TABLET | Freq: Once | ORAL | Status: AC
  Administered 2020-04-01: 5 mg via ORAL
  Filled 2020-04-01: qty 1

## 2020-04-01 MED ORDER — CEFAZOLIN SODIUM-DEXTROSE 2-4 GM/100ML-% IV SOLN
2.0000 g | INTRAVENOUS | Status: AC
  Administered 2020-04-01: 2 g via INTRAVENOUS

## 2020-04-01 MED ORDER — HEPARIN SODIUM (PORCINE) 1000 UNIT/ML IJ SOLN
INTRAMUSCULAR | Status: AC | PRN
  Administered 2020-04-01: 5000 [IU] via INTRAVENOUS

## 2020-04-01 MED ORDER — ACETAMINOPHEN 325 MG PO TABS: 325.0000 mg | ORAL_TABLET | ORAL | Status: DC | PRN

## 2020-04-01 MED ORDER — LACTATED RINGERS IV SOLN: INTRAVENOUS | Status: DC

## 2020-04-01 MED ORDER — MORPHINE SULFATE (PF) 2 MG/ML IV SOLN: 2.0000 mg | INTRAVENOUS | Status: DC | PRN

## 2020-04-01 MED ORDER — IOHEXOL 300 MG/ML  SOLN
150.0000 mL | Freq: Once | INTRAMUSCULAR | Status: AC | PRN
  Administered 2020-04-01: 75 mL via INTRA_ARTERIAL

## 2020-04-01 MED ORDER — FENTANYL CITRATE (PF) 100 MCG/2ML IJ SOLN
INTRAMUSCULAR | Status: AC | PRN
Start: 2020-04-01 — End: 2020-04-01
  Administered 2020-04-01 (×7): 12.5 ug via INTRAVENOUS

## 2020-04-01 MED ORDER — ACETAMINOPHEN 650 MG RE SUPP
325.0000 mg | RECTAL | Status: DC | PRN
Start: 2020-04-01 — End: 2020-04-02

## 2020-04-01 MED ORDER — SODIUM CHLORIDE 0.9 % IV SOLN
INTRAVENOUS | Status: DC | PRN
  Administered 2020-04-01: 500 mL

## 2020-04-01 MED ORDER — LIDOCAINE 2% (20 MG/ML) 5 ML SYRINGE
INTRAMUSCULAR | Status: DC | PRN
  Administered 2020-04-01: 60 mg via INTRAVENOUS

## 2020-04-01 MED ORDER — PROMETHAZINE HCL 25 MG/ML IJ SOLN
6.2500 mg | INTRAMUSCULAR | Status: DC | PRN
Start: 2020-04-01 — End: 2020-04-01

## 2020-04-01 MED ORDER — ALUM & MAG HYDROXIDE-SIMETH 200-200-20 MG/5ML PO SUSP: 15.0000 mL | ORAL | Status: DC | PRN

## 2020-04-01 MED ORDER — PREDNISONE 10 MG PO TABS
10.0000 mg | ORAL_TABLET | Freq: Every day | ORAL | Status: DC
  Administered 2020-04-02: 10 mg via ORAL
  Filled 2020-04-01: qty 1

## 2020-04-01 SURGICAL SUPPLY — 48 items
BANDAGE ESMARK 6X9 LF (GAUZE/BANDAGES/DRESSINGS) IMPLANT
BNDG ESMARK 6X9 LF (GAUZE/BANDAGES/DRESSINGS)
CANISTER SUCT 3000ML PPV (MISCELLANEOUS) ×2 IMPLANT
CANNULA VESSEL 3MM 2 BLNT TIP (CANNULA) ×8 IMPLANT
CATH EMB 4FR 80CM (CATHETERS) ×2 IMPLANT
CLIP LIGATING EXTRA MED SLVR (CLIP) ×4 IMPLANT
CLIP LIGATING EXTRA SM BLUE (MISCELLANEOUS) ×4 IMPLANT
COVER WAND RF STERILE (DRAPES) IMPLANT
CUFF TOURN SGL QUICK 34 (TOURNIQUET CUFF)
CUFF TOURN SGL QUICK 42 (TOURNIQUET CUFF) IMPLANT
CUFF TRNQT CYL 34X4.125X (TOURNIQUET CUFF) IMPLANT
DERMABOND ADVANCED (GAUZE/BANDAGES/DRESSINGS) ×1
DERMABOND ADVANCED .7 DNX12 (GAUZE/BANDAGES/DRESSINGS) ×1 IMPLANT
DRAIN SNY 10X20 3/4 PERF (WOUND CARE) IMPLANT
DRAPE HALF SHEET 40X57 (DRAPES) IMPLANT
DRAPE X-RAY CASS 24X20 (DRAPES) IMPLANT
ELECT REM PT RETURN 9FT ADLT (ELECTROSURGICAL) ×2
ELECTRODE REM PT RTRN 9FT ADLT (ELECTROSURGICAL) ×1 IMPLANT
EVACUATOR SILICONE 100CC (DRAIN) IMPLANT
GLOVE SS BIOGEL STRL SZ 7.5 (GLOVE) ×2 IMPLANT
GLOVE SUPERSENSE BIOGEL SZ 7.5 (GLOVE) ×2
GOWN STRL REUS W/ TWL LRG LVL3 (GOWN DISPOSABLE) ×3 IMPLANT
GOWN STRL REUS W/TWL LRG LVL3 (GOWN DISPOSABLE) ×3
INSERT FOGARTY SM (MISCELLANEOUS) IMPLANT
KIT BASIN OR (CUSTOM PROCEDURE TRAY) ×2 IMPLANT
KIT TURNOVER KIT B (KITS) ×2 IMPLANT
NS IRRIG 1000ML POUR BTL (IV SOLUTION) ×4 IMPLANT
PACK PERIPHERAL VASCULAR (CUSTOM PROCEDURE TRAY) ×2 IMPLANT
PAD ARMBOARD 7.5X6 YLW CONV (MISCELLANEOUS) ×4 IMPLANT
PADDING CAST COTTON 6X4 STRL (CAST SUPPLIES) IMPLANT
SET COLLECT BLD 21X3/4 12 (NEEDLE) IMPLANT
STOPCOCK 4 WAY LG BORE MALE ST (IV SETS) IMPLANT
SUT ETHILON 3 0 PS 1 (SUTURE) IMPLANT
SUT PROLENE 5 0 C 1 24 (SUTURE) ×4 IMPLANT
SUT PROLENE 6 0 CC (SUTURE) ×6 IMPLANT
SUT SILK 2 0 SH (SUTURE) ×2 IMPLANT
SUT VIC AB 2-0 CT1 27 (SUTURE) ×2
SUT VIC AB 2-0 CT1 TAPERPNT 27 (SUTURE) ×2 IMPLANT
SUT VIC AB 2-0 CTX 36 (SUTURE) ×4 IMPLANT
SUT VIC AB 3-0 SH 27 (SUTURE) ×4
SUT VIC AB 3-0 SH 27X BRD (SUTURE) ×4 IMPLANT
SUT VIC AB 4-0 PS2 27 (SUTURE) ×4 IMPLANT
SYR BULB IRRIG 60ML STRL (SYRINGE) ×2 IMPLANT
TOWEL GREEN STERILE (TOWEL DISPOSABLE) ×2 IMPLANT
TRAY FOLEY MTR SLVR 16FR STAT (SET/KITS/TRAYS/PACK) ×2 IMPLANT
TUBING EXTENTION W/L.L. (IV SETS) IMPLANT
UNDERPAD 30X36 HEAVY ABSORB (UNDERPADS AND DIAPERS) ×2 IMPLANT
WATER STERILE IRR 1000ML POUR (IV SOLUTION) ×2 IMPLANT

## 2020-04-01 NOTE — Anesthesia Preprocedure Evaluation (Addendum)
Anesthesia Evaluation  Patient identified by MRN, date of birth, ID band Patient awake    Reviewed: Allergy & Precautions, NPO status , Patient's Chart, lab work & pertinent test results, reviewed documented beta blocker date and time   History of Anesthesia Complications Negative for: history of anesthetic complications  Airway Mallampati: II  TM Distance: >3 FB Neck ROM: Full    Dental  (+) Edentulous Upper, Edentulous Lower   Pulmonary neg pulmonary ROS,    Pulmonary exam normal        Cardiovascular hypertension, Pt. on medications and Pt. on home beta blockers Normal cardiovascular exam+ dysrhythmias Atrial Fibrillation   Left popliteal artery occlusion  AAA s/p endovascular aortic repair 2012, s/p repair of type 2 endoleak in 2017   Neuro/Psych negative neurological ROS  negative psych ROS   GI/Hepatic negative GI ROS, Neg liver ROS,   Endo/Other  diabetes, Type 2, Oral Hypoglycemic Agents  Renal/GU Renal Insufficiency and CRFRenal disease (Cr 1.06)  negative genitourinary   Musculoskeletal  (+) Arthritis ,   Abdominal   Peds  Hematology Thrombocytopenia, plts 92k   Anesthesia Other Findings Day of surgery medications reviewed with patient.  Reproductive/Obstetrics negative OB ROS                           Anesthesia Physical Anesthesia Plan  ASA: III and emergent  Anesthesia Plan: General   Post-op Pain Management:    Induction: Intravenous  PONV Risk Score and Plan: 3 and Treatment may vary due to age or medical condition and Ondansetron  Airway Management Planned: Oral ETT  Additional Equipment: Arterial line  Intra-op Plan:   Post-operative Plan: Extubation in OR  Informed Consent: I have reviewed the patients History and Physical, chart, labs and discussed the procedure including the risks, benefits and alternatives for the proposed anesthesia with the patient or  authorized representative who has indicated his/her understanding and acceptance.     Dental advisory given  Plan Discussed with: CRNA  Anesthesia Plan Comments:      Anesthesia Quick Evaluation

## 2020-04-01 NOTE — Transfer of Care (Signed)
Immediate Anesthesia Transfer of Care Note  Patient: Chelsea Dean  Procedure(s) Performed: THROMBECTOMY LEFT POPLITEAL ARTERY (Left Leg Lower)  Patient Location: PACU  Anesthesia Type:General  Level of Consciousness: sedated  Airway & Oxygen Therapy: Patient connected to face mask oxygen  Post-op Assessment: Post -op Vital signs reviewed and stable  Post vital signs: stable  Last Vitals:  Vitals Value Taken Time  BP 125/77 04/01/20 1641  Temp    Pulse 85 04/01/20 1643  Resp 11 04/01/20 1643  SpO2 97 % 04/01/20 1643  Vitals shown include unvalidated device data.  Last Pain: There were no vitals filed for this visit.       Complications: No complications documented.

## 2020-04-01 NOTE — H&P (Signed)
Chief Complaint: Patient was seen in consultation today for  Bilateral lower extremity arteriogram with anticipated stent graft placement and attempted endovascular repair of Type II Endoleak at the request of Dr Janet Berlin   Supervising Physician: Jacqulynn Cadet  Patient Status: [facility] - Out-pt  History of Present Illness: Chelsea Dean is a 87 y.o. female   Known to [place] with Dr Laurence Ferrari for Type II AAA endoleak Also known for Rt renal cryoablation 2019 with Dr Laurence Ferrari  Most recent imaging and follow up with Dr Laurence Ferrari 03/11/20 Note same day: 87 year old female who had been doing quite well after undergoing endovascular endoleak repair in June 2017.  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  This is likely due to increased collateralization through the lumbar arteries to fill the left internal iliac artery.  Additionally, she has developed fairly significant left greater than right lower extremity claudication since our last visit and now requires a walker when previously she could ambulate well with just a cane.  After a long discussion with the patient's daughter, we are in agreement that attempting to restore direct flow to the left lower extremity and repair the significant stenosis in the right iliac limb would provide a significant increase in quality of life.  Additionally, patient requires another attempt at endoleak repair which could be performed at the same time.  She and her daughter are in agreement and desire to proceed.  They understand that the potential risks include bleeding, failure to recanalize the occluded left iliac limb, and distal embolization resulting in worsening claudication or distal ischemia.  1.)  Schedule for bilateral lower extremity arteriogram with anticipated stent graft placement and  attempted endovascular repair of type II endoleak to be performed at [facility].  Patient will have to stop her Coumadin for 5 days prior to the procedure.  She will also require premedication for her contrast allergy.  Scheduled today for Bilateral lower extremity arteriogram with anticipated stent graft placement and attempted endovascular repair of Type II Endoleak  Hx Afib-- started Eliquis 2 weeks ago None since Sat 2/12  Past Medical History:  Diagnosis Date  . A-fib ([place]) 10/19/2016  . Abdominal aortic aneurysm (AAA) without rupture ([place]) 09/15/2013  . Acute idiopathic gout of right foot   . AKI (acute kidney injury) ([place]) 10/18/2016  . Anticoagulated on Coumadin   . Arthritis   . Diabetes mellitus without complication ([place])   . Diverticular disease of large intestine 04/16/2013  . Diverticulosis   . DM2 (diabetes mellitus, type 2) ([place]) 10/19/2016  . Dysrhythmia    Atrial fib  . History of colon polyps 04/16/2013  . Hx of colonic polyps   . Hyperlipidemia   . Hypertension   . Long term current use of anticoagulant therapy 02/05/2015  . Lumbar radiculopathy 03/28/2014  . Microalbuminuria 04/16/2013  . Osteoarthritis 04/16/2013  . Osteoarthritis of cervical spine 03/28/2014  . Pelvic mass 10/19/2016  . Right renal mass   . SBO (small bowel obstruction) ([place]) 10/18/2016  . Thrombocytopenia ([place]) 04/16/2013   Overview:  referred to hematology 01/2104    Past Surgical History:  Procedure Laterality Date  . ABDOMINAL AORTIC ANEURYSM REPAIR    . ABDOMINAL HYSTERECTOMY    . IR GENERIC HISTORICAL  07/01/2015   IR RADIOLOGIST EVAL & MGMT 07/01/2015 [facility] INTERV RAD  . IR RADIOLOGIST EVAL & MGMT  03/11/2020    Allergies: Ace inhibitors, Contrast media [iodinated diagnostic agents], and Nsaids  Medications: Prior to Admission medications   Medication Sig Start Date End Date Taking? Authorizing Provider  amLODipine (NORVASC) 10 MG tablet TAKE 1 TABLET BY MOUTH EVERY DAY FOR BLOOD  PRESSURE Patient taking differently: Take 10 mg by mouth daily. 01/24/20  Yes [name], MD  apixaban (ELIQUIS) 5 MG TABS tablet Take 5 mg by mouth 2 (two) times daily.   Yes [provider]  atorvastatin (LIPITOR) 10 MG tablet Take 1 tablet (10 mg total) by mouth daily. 03/26/19  Yes [name], MD  diclofenac sodium (VOLTAREN) 1 % GEL Apply 2 g topically daily as needed (KNEE PAIN).   Yes [provider]  diphenhydrAMINE (BENADRYL) 50 MG tablet Take 50 mg by mouth at bedtime as needed for itching.   Yes [provider]  glipiZIDE (GLUCOTROL XL) 10 MG 24 hr tablet TAKE 1 TABLET BY MOUTH EVERY DAY WITH BREAKFAST Patient taking differently: Take 10 mg by mouth daily with breakfast. 03/06/20  Yes Dutch Quint B, FNP  Lidocaine (HM LIDOCAINE PATCH) 4 % PTCH Apply 1 patch topically every 12 (twelve) hours. Patient taking differently: Apply 1 patch topically every 12 (twelve) hours as needed (pain). 72/09/47  Yes Delora Fuel, MD  losartan (COZAAR) 100 MG tablet TAKE 1 TABLET BY MOUTH EVERY DAY Patient taking differently: Take 100 mg by mouth daily. 11/26/19  Yes [name], MD  metoprolol tartrate (LOPRESSOR) 100 MG tablet TAKE 1 TABLET BY MOUTH 2 TIMES A DAY Patient taking differently: Take 100 mg by mouth 2 (two) times daily. 01/24/20  Yes [name], MD  [facility] Place 1 drop into the right eye 3 (three) times daily.   Yes [provider]  predniSONE (DELTASONE) 10 MG tablet Take by mouth daily with breakfast. One dose/ unsure of strength 2/14@1700 , 2300, then 2/15@ 0500   Yes [provider]     Family History  Problem Relation Age of Onset  . Cancer Sister        Rectal and Stomach    Social History   Socioeconomic History  . Marital status: Married    Spouse name: Not on file  . Number of children: Not on file  . Years of education: Not on file  . Highest education  level: Not on file  Occupational History  . Occupation: retired [employer]  . Smoking status: Never Smoker  . Smokeless tobacco: Current User    Types: Snuff  Vaping Use  . Vaping Use: Never used  Substance and Sexual Activity  . Alcohol use: No    Alcohol/week: 0.0 standard drinks  . Drug use: No  . Sexual activity: Not on file  Other Topics Concern  . Not on file  Social History Narrative   Admitted to [place] 10/27/16   Married   Use snuff   Alcohol none   DNR   Social Determinants of Health   Financial Resource Strain: Low Risk   . Difficulty of Paying Living Expenses: Not hard at all  Food Insecurity: No Food Insecurity  . Worried About Charity fundraiser in the Last Year: Never true  . Ran Out of Food in the Last Year: Never true  Transportation Needs: No Transportation Needs  . Lack of Transportation (Medical): No  . Lack of Transportation (Non-Medical): No  Physical Activity: Not on file  Stress: Not  on file  Social Connections: Not on file    Review of Systems: A 12 point ROS discussed and pertinent positives are indicated in the HPI above.  All other systems are negative.  Review of Systems  Constitutional: Positive for activity change. Negative for fatigue and fever.  HENT: Positive for hearing loss.   Respiratory: Negative for cough and shortness of breath.   Cardiovascular: Negative for chest pain and leg swelling.  Gastrointestinal: Negative for abdominal pain, nausea and vomiting.  Musculoskeletal: Positive for gait problem.  Skin: Negative for color change.  Psychiatric/Behavioral: Negative for behavioral problems and confusion.    Vital Signs: BP 136/80 (BP Location: Right Arm)   Pulse 69   Temp (!) 97.5 F (36.4 C) (Oral)   Resp 19   Ht 5\' 2"  (1.575 m)   Wt 164 lb 14.5 oz (74.8 kg)   SpO2 98%   BMI 30.16 kg/m   Physical Exam Vitals reviewed.  HENT:     Mouth/Throat:     Mouth: Mucous membranes are  moist.  Cardiovascular:     Rate and Rhythm: Rhythm irregular.  Pulmonary:     Effort: Pulmonary effort is normal.     Breath sounds: No wheezing.  Abdominal:     Palpations: Abdomen is soft.     Tenderness: There is no abdominal tenderness.  Musculoskeletal:        General: Normal range of motion.     Comments: Bilat legs are = in size Both warm to touch  Skin:    General: Skin is warm.  Neurological:     Mental Status: She is alert and oriented to person, place, and time.  Psychiatric:        Behavior: Behavior normal.     Comments: HOH and can sometimes be confused Daughter at bedside; consented for procedure     Imaging: IR Radiologist Eval & Mgmt  Result Date: 03/11/2020 Please refer to notes tab for details about interventional procedure. (Op Note)   Labs:  CBC: Recent Labs    09/21/19 1040  WBC 4.8  HGB 13.5  HCT 43.2  PLT 86.0*    COAGS: No results for input(s): INR, APTT in the last 8760 hours.  BMP: Recent Labs    09/21/19 1040  NA 138  K 3.7  CL 106  CO2 23  GLUCOSE 118*  BUN 22  CALCIUM 10.5  CREATININE 1.01    LIVER FUNCTION TESTS: Recent Labs    09/21/19 1040  BILITOT 0.6  AST 18  ALT 12  ALKPHOS 49  PROT 7.4  ALBUMIN 4.4    TUMOR MARKERS: No results for input(s): AFPTM, CEA, CA199, CHROMGRNA in the last 8760 hours.  Assessment and Plan:  Persistent AAA type II Endoleak Left greater than right LE claudication Scheduled today for LE arteriogram with probable intervention for claudication and endoleak Risks and benefits of Bilateral lower extremity arteriogram with anticipated stent graft placement and attempted endovascular repair of Type II Endoleak were discussed with the patient including, but not limited to bleeding, infection, vascular injury or contrast induced renal failure.  This interventional procedure involves the use of X-rays and because of the nature of the planned procedure, it is possible that we will have  prolonged use of X-ray fluoroscopy.  Potential radiation risks to you include (but are not limited to) the following: - A slightly elevated risk for cancer  several years later in life. This risk is typically less than 0.5% percent. This risk is low  in comparison to the normal incidence of human cancer, which is 33% for women and 50% for men according to the [place]. - Radiation induced injury can include skin redness, resembling a rash, tissue breakdown / ulcers and hair loss (which can be temporary or permanent).   The likelihood of either of these occurring depends on the difficulty of the procedure and whether you are sensitive to radiation due to previous procedures, disease, or genetic conditions.   IF your procedure requires a prolonged use of radiation, you will be notified and given written instructions for further action.  It is your responsibility to monitor the irradiated area for the 2 weeks following the procedure and to notify your physician if you are concerned that you have suffered a radiation induced injury.    All of the patient's questions were answered, patient and daughter are agreeable to proceed.  Consent signed and in chart.  Thank you for this interesting consult.  I greatly enjoyed meeting Magenta Schmiesing and look forward to participating in their care.  A copy of this report was sent to the requesting provider on this date.  Electronically Signed: Lavonia Drafts, PA-C 04/01/2020, 7:31 AM   I spent a total of    25 Minutes in face to face in clinical consultation, greater than 50% of which was counseling/coordinating care for Bilat LE arteriogram with possible stent graft placement and attempted endovascular type II endoleak repair

## 2020-04-01 NOTE — Sedation Documentation (Signed)
Patient transported to surgical short stay. Left groin sheath left in place for vascular surgeon Dr. Donnetta Hutching.  Bilateral groin sites assessed. Clean, dry and intact. Soft to palpation. No hematoma noted. No drainage from sites. Intact pulses on the right via doppler. Absent pulses on the left. Physician aware.

## 2020-04-01 NOTE — Anesthesia Postprocedure Evaluation (Signed)
Anesthesia Post Note  Patient: Chelsea Dean  Procedure(s) Performed: THROMBECTOMY LEFT POPLITEAL ARTERY (Left Leg Lower)     Patient location during evaluation: PACU Anesthesia Type: General Level of consciousness: awake and alert Pain management: pain level controlled Vital Signs Assessment: post-procedure vital signs reviewed and stable Respiratory status: spontaneous breathing, nonlabored ventilation, respiratory function stable and patient connected to nasal cannula oxygen Cardiovascular status: blood pressure returned to baseline and stable Postop Assessment: no apparent nausea or vomiting Anesthetic complications: no   No complications documented.  Last Vitals:  Vitals:   04/01/20 1800 04/01/20 1841  BP: 135/79 (!) 143/83  Pulse: 83   Resp: 11   Temp:  36.4 C  SpO2: 95%     Last Pain:  Vitals:   04/01/20 1841  TempSrc: Oral  PainSc:                  March Rummage Glendoris Nodarse

## 2020-04-01 NOTE — Anesthesia Procedure Notes (Signed)
Arterial Line Insertion Start/End2/15/2022 3:05 PM, 04/01/2020 3:10 PM Performed by: Darral Dash, DO, anesthesiologist  Patient location: OR. Preanesthetic checklist: patient identified, IV checked, site marked, risks and benefits discussed, surgical consent, monitors and equipment checked, pre-op evaluation, timeout performed and anesthesia consent Lidocaine 1% used for infiltration Left, radial was placed Catheter size: 20 Fr Hand hygiene performed  and maximum sterile barriers used   Attempts: 2 Procedure performed using ultrasound guided technique. Following insertion, dressing applied and Biopatch. Post procedure assessment: normal and unchanged  Patient tolerated the procedure well with no immediate complications.

## 2020-04-01 NOTE — Consult Note (Signed)
Vascular and Vein Specialist   Patient name: Chelsea Dean MRN: [id number] DOB: 1927-08-20 Sex: female    HPI: Chelsea Dean is a 87 y.o. female seen in the interventional radiology suite following procedure.  She has a complicated history.  She had undergone a remote stent graft repair in [facility] of abdominal aortic aneurysm with a aortobiiliac stent graft.  She had had progressive sac enlargement and had undergone prior coiling by Dr. Laurence Ferrari with interventional radiology.  She recently was admitted to [facility] for other issues and was found to have continued expansion of her aneurysm sac and endoleak by CT scan.  She was scheduled today for elective recoiling of her endoleak.  This was approached through the left femoral artery.  She had coiling of IMA branches.  At the completion of the coiling the patient was found to have ischemia of her left foot.  She had a posterior tibial pulse by report prior to the procedure.  The supra femoral artery was punctured antegrade and attempts at endovascular treatment of the occlusion were unsuccessful.  Vascular surgery was consulted for revascularization.  The patient's daughter is present.  The patient is able to answer questions but her daughter helps with the history as well.  Apparently she had been walking some with a walker until around Christmas when she began having more pain in her left leg and was having more difficulty with walking and is now mostly wheelchair-bound.  It is difficult to determine if this is claudication or some other cause for her worsening ambulatory status.  Her left limb of her graft was known to be patent in June 2019 but I do not have any information between now and her recent [place] admission.  Past Medical History:  Diagnosis Date  . A-fib ([place]) 10/19/2016  . Abdominal aortic aneurysm (AAA) without rupture ([place]) 09/15/2013  . Acute idiopathic gout of right  foot   . AKI (acute kidney injury) ([place]) 10/18/2016  . Anticoagulated on Coumadin   . Arthritis   . Diabetes mellitus without complication ([place])   . Diverticular disease of large intestine 04/16/2013  . Diverticulosis   . DM2 (diabetes mellitus, type 2) ([place]) 10/19/2016  . Dysrhythmia    Atrial fib  . History of colon polyps 04/16/2013  . Hx of colonic polyps   . Hyperlipidemia   . Hypertension   . Long term current use of anticoagulant therapy 02/05/2015  . Lumbar radiculopathy 03/28/2014  . Microalbuminuria 04/16/2013  . Osteoarthritis 04/16/2013  . Osteoarthritis of cervical spine 03/28/2014  . Pelvic mass 10/19/2016  . Right renal mass   . SBO (small bowel obstruction) ([place]) 10/18/2016  . Thrombocytopenia ([place]) 04/16/2013   Overview:  referred to hematology 01/2104    Family History  Problem Relation Age of Onset  . Cancer Sister        Rectal and Stomach    SOCIAL HISTORY: Social History   Tobacco Use  . Smoking status: Never Smoker  . Smokeless tobacco: Current User    Types: Snuff  Substance Use Topics  . Alcohol use: No    Alcohol/week: 0.0 standard drinks    Allergies  Allergen Reactions  . Ace Inhibitors Shortness Of Breath and Rash  . Contrast Media [Iodinated Diagnostic Agents] Shortness Of Breath and Rash  . Nsaids     Unknown reaction    No current facility-administered medications for this encounter.   Current Outpatient Medications  Medication Sig Dispense Refill  .  amLODipine (NORVASC) 10 MG tablet TAKE 1 TABLET BY MOUTH EVERY DAY FOR BLOOD PRESSURE (Patient taking differently: Take 10 mg by mouth daily.) 90 tablet 1  . apixaban (ELIQUIS) 5 MG TABS tablet Take 5 mg by mouth 2 (two) times daily.    Marland Kitchen atorvastatin (LIPITOR) 10 MG tablet Take 1 tablet (10 mg total) by mouth daily. 90 tablet 3  . diclofenac sodium (VOLTAREN) 1 % GEL Apply 2 g topically daily as needed (KNEE PAIN).    Marland Kitchen diphenhydrAMINE (BENADRYL) 50 MG tablet Take 50 mg by mouth at bedtime as  needed for itching.    Marland Kitchen glipiZIDE (GLUCOTROL XL) 10 MG 24 hr tablet TAKE 1 TABLET BY MOUTH EVERY DAY WITH BREAKFAST (Patient taking differently: Take 10 mg by mouth daily with breakfast.) 90 tablet 2  . Lidocaine (HM LIDOCAINE PATCH) 4 % PTCH Apply 1 patch topically every 12 (twelve) hours. (Patient taking differently: Apply 1 patch topically every 12 (twelve) hours as needed (pain).) 14 patch 0  . losartan (COZAAR) 100 MG tablet TAKE 1 TABLET BY MOUTH EVERY DAY (Patient taking differently: Take 100 mg by mouth daily.) 90 tablet 1  . metoprolol tartrate (LOPRESSOR) 100 MG tablet TAKE 1 TABLET BY MOUTH 2 TIMES A DAY (Patient taking differently: Take 100 mg by mouth 2 (two) times daily.) 180 tablet 1  . PREDNISOLON-MOXIFLOX-NEPAFENAC OP Place 1 drop into the right eye 3 (three) times daily.    . predniSONE (DELTASONE) 10 MG tablet Take by mouth daily with breakfast. One dose/ unsure of strength 2/14@1700 , 2300, then 2/15@ 0500     Facility-Administered Medications Ordered in Other Encounters  Medication Dose Route Frequency Provider Last Rate Last Admin  . 0.9 %  sodium chloride infusion   Intravenous Continuous Brynda Greathouse Sue-Ellen, PA      . fentaNYL (SUBLIMAZE) 100 MCG/2ML injection           . fentaNYL (SUBLIMAZE) injection   Intravenous PRN Criselda Peaches, MD   12.5 mcg at 04/01/20 1230  . heparin sodium (porcine) 1000 UNIT/ML injection           . heparin sodium (porcine) injection    PRN Criselda Peaches, MD   5,000 Units at 04/01/20 1153  . iohexol (OMNIPAQUE) 300 MG/ML solution 150 mL  150 mL Intra-arterial Once PRN Criselda Peaches, MD      . lidocaine (XYLOCAINE) 1 % (with pres) injection           . lidocaine (XYLOCAINE) 1 % (with pres) injection             REVIEW OF SYSTEMS:  Reviewed in her old history with nothing to add  PHYSICAL EXAM: There were no vitals filed for this visit.  GENERAL: The patient is a well-nourished female, in no acute distress. The vital  signs are documented above. CARDIOVASCULAR: No palpable pulse in the left foot.  Her foot is cool and somewhat mottled.  She does have diminished but present motor and sensory function in her foot PULMONARY: There is good air exchange  MUSCULOSKELETAL: There are no major deformities or cyanosis. NEUROLOGIC: No focal weakness or paresthesias are detected. SKIN: There are no ulcers or rashes noted. PSYCHIATRIC: The patient has a normal affect.  DATA:  Prior CT scans were reviewed showing patency of her graft in 2019 and occlusion on her most recent CT angiogram.  Also reviewed today's angiograms with Dr. Laurence Ferrari.  She does have occlusion at the popliteal artery.  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  Will be taken to the operating room for thrombectomy.  Discussed this at length with the patient and her daughter present.  Once the acute issue is resolved, will determine if there is any indication for correction of her left iliac limb occlusion most likely with right to left femoral-femoral bypass.    Rosetta Posner, MD FACS [facility] [phone]  Note: Portions of this report may have been transcribed using voice recognition software.  Every effort has been made to ensure accuracy; however, inadvertent computerized transcription errors may still be present.

## 2020-04-01 NOTE — Anesthesia Procedure Notes (Signed)
Procedure Name: Intubation Date/Time: 04/01/2020 3:00 PM Performed by: Lavell Luster, CRNA Pre-anesthesia Checklist: Patient identified, Emergency Drugs available, Suction available, Patient being monitored and Timeout performed Patient Re-evaluated:Patient Re-evaluated prior to induction Oxygen Delivery Method: Circle system utilized Preoxygenation: Pre-oxygenation with 100% oxygen Induction Type: IV induction Ventilation: Mask ventilation without difficulty Laryngoscope Size: Mac and 3 Grade View: Grade II Tube type: Oral Tube size: 7.5 mm Number of attempts: 1 Airway Equipment and Method: Stylet Placement Confirmation: ETT inserted through vocal cords under direct vision,  positive ETCO2 and breath sounds checked- equal and bilateral Secured at: 22 cm Tube secured with: Tape Dental Injury: Teeth and Oropharynx as per pre-operative assessment

## 2020-04-01 NOTE — Progress Notes (Signed)
Patient received infusion for COVID-19 in early to mid December. Per Dr. Daiva Huge will not retest for COVID.

## 2020-04-01 NOTE — Op Note (Signed)
    OPERATIVE REPORT  DATE OF SURGERY: 04/01/2020  PATIENT: Chelsea Dean, 87 y.o. female MRN: [id number]  DOB: [birth year]  PRE-OPERATIVE DIAGNOSIS: Left leg ischemia secondary to embolus  POST-OPERATIVE DIAGNOSIS:  Same  PROCEDURE: Left leg embolectomy with below-knee popliteal exposure and removal of left femoral sheath with direct exposure  SURGEON:  Curt Jews, M.D.  PHYSICIAN ASSISTANT: Arlee Muslim, PA-C  The assistant was needed for exposure and to expedite the case  ANESTHESIA: General  EBL: per anesthesia record  Total I/O In: 1200 [I.V.:1100; IV Piggyback:100] Out: 480 [Urine:430; Blood:50]  BLOOD ADMINISTERED: none  DRAINS: none  SPECIMEN: none  COUNTS CORRECT:  YES  PATIENT DISPOSITION:  PACU - hemodynamically stable  PROCEDURE DETAILS: The patient was taken up and placed supine position with area of both groins and the left leg prepped draped you sterile fashion. Incision was made to the medial aspect of the below-knee popliteal exposure. The gastrocnemius muscle was reflected posteriorly. The popliteal artery was exposed and encircled with vessel loop. The patient was given 7000 units penned venous heparin after attic circulation time the below-knee popliteal artery was occluded proximally and distally and opened transversely with an 11 blade. There was minimal inflow. There was some backbleeding. A 4 Fogarty catheter was passed to the level of mid thigh and a very organized large plug of thrombus was removed. There was good bleeding following this. Fogarty catheter was passed again and there was no further clot retrieved. The incision in the below-knee popliteal artery was closed with interrupted 6-0 Prolene sutures. There was Doppler flow through the popliteal artery.. There was no pus pulse. The patient has a known occlusion of the left limb of her prior aortoiliac stent graft. Next attention was turned to the left groin. The patient had an antegrade puncture  in the superficial femoral artery proximally large [place] sheath. Incision was made over the sheath and carried down to isolate superficial femoral artery. The artery was exposed proximal distal to the sheath. The sheath was removed and there was no evidence of injury to the artery. The puncture from the sheath and the artery was closed with 6-0 Prolene suture. Clamps were removed and again good Doppler flow was noted through the popliteal artery. Patient was given 50 mg of protamine to reverse the heparin. Wounds irrigated with saline. Hemostasis tended to cautery. The wounds were closed with 2-0 Vicryl in the deep tissue and the skin was closed with 3-0 subcuticular Vicryl stitch. Sterile dressing was applied and the patient was transferred to the recovery room in stable condition   Rosetta Posner, M.D., [facility] 04/01/2020 4:33 PM  Note: Portions of this report may have been transcribed using voice recognition software.  Every effort has been made to ensure accuracy; however, inadvertent computerized transcription errors may still be present.

## 2020-04-01 NOTE — Progress Notes (Signed)
Pt. Arrived to unit accompanied by 2 PACU RNs. She is alert. Bilateral groin sites from left throbectomy in femoral artery. She also has incision on left inner calf that is OTA with skin glue. No hematomas and no drainage. All pulses on RLE palpable. Left dorsalis pedis is not dopplerable, however left posterior and anterial tibial and popliteal are. MD is aware. Pt. Denies pain. CHG bath done. VS are WNL and she denies pain. Currently resting in bed in no accute distress with daughter at bedside.Fuller Canada, RN

## 2020-04-02 ENCOUNTER — Encounter (HOSPITAL_COMMUNITY): Payer: Self-pay | Admitting: Vascular Surgery

## 2020-04-02 LAB — LIPID PANEL
Cholesterol: 130 mg/dL (ref 0–200)
HDL: 31 mg/dL — ABNORMAL LOW (ref >40)
LDL Cholesterol: 84 mg/dL (ref 0–99)
Total CHOL/HDL Ratio: 4.2 RATIO
Triglycerides: 74 mg/dL (ref <150)
VLDL: 15 mg/dL (ref 0–40)

## 2020-04-02 LAB — CBC
HCT: 31.7 % — ABNORMAL LOW (ref 36.0–46.0)
Hemoglobin: 9.8 g/dL — ABNORMAL LOW (ref 12.0–15.0)
MCH: 26.6 pg (ref 26.0–34.0)
MCHC: 30.9 g/dL (ref 30.0–36.0)
MCV: 86.1 fL (ref 80.0–100.0)
Platelets: 93 10*3/uL — ABNORMAL LOW (ref 150–400)
RBC: 3.68 MIL/uL — ABNORMAL LOW (ref 3.87–5.11)
RDW: 16.1 % — ABNORMAL HIGH (ref 11.5–15.5)
WBC: 9 10*3/uL (ref 4.0–10.5)
nRBC: 0 % (ref 0.0–0.2)

## 2020-04-02 LAB — BASIC METABOLIC PANEL
Anion gap: 9 (ref 5–15)
BUN: 18 mg/dL (ref 8–23)
CO2: 24 mmol/L (ref 22–32)
Calcium: 9.4 mg/dL (ref 8.9–10.3)
Chloride: 110 mmol/L (ref 98–111)
Creatinine, Ser: 1.07 mg/dL — ABNORMAL HIGH (ref 0.44–1.00)
GFR, Estimated: 49 mL/min — ABNORMAL LOW (ref >60)
Glucose, Bld: 160 mg/dL — ABNORMAL HIGH (ref 70–99)
Potassium: 3.5 mmol/L (ref 3.5–5.1)
Sodium: 143 mmol/L (ref 135–145)

## 2020-04-02 MED ORDER — OXYCODONE-ACETAMINOPHEN 5-325 MG PO TABS: 1.0000 | ORAL_TABLET | Freq: Four times a day (QID) | ORAL | 0 refills | Status: DC | PRN

## 2020-04-02 NOTE — Progress Notes (Addendum)
  Progress Note    04/02/2020 8:34 AM 1 Day Post-Op  Subjective:  No complaints   Vitals:   04/02/20 0400 04/02/20 0743  BP: 98/88 (!) 102/58  Pulse: (!) 55 67  Resp: 14 19  Temp: (!) 97.5 F (36.4 C) 98.4 F (36.9 C)  SpO2: 91% 94%   Physical Exam: Lungs:  Non labored Incisions:  L groin and popliteal incisions c/d/i Extremities:  L PT signal by doppler; foot warm to touch with motor and sensation intact Neurologic: A&O  CBC    Component Value Date/Time   WBC 9.0 04/02/2020 0548   RBC 3.68 (L) 04/02/2020 0548   HGB 9.8 (L) 04/02/2020 0548   HCT 31.7 (L) 04/02/2020 0548   PLT 93 (L) 04/02/2020 0548   MCV 86.1 04/02/2020 0548   MCH 26.6 04/02/2020 0548   MCHC 30.9 04/02/2020 0548   RDW 16.1 (H) 04/02/2020 0548   LYMPHSABS 0.7 04/01/2018 1854   MONOABS 0.8 04/01/2018 1854   EOSABS 0.0 04/01/2018 1854   BASOSABS 0.0 04/01/2018 1854    BMET    Component Value Date/Time   NA 143 04/02/2020 0548   K 3.5 04/02/2020 0548   CL 110 04/02/2020 0548   CO2 24 04/02/2020 0548   GLUCOSE 160 (H) 04/02/2020 0548   BUN 18 04/02/2020 0548   CREATININE 1.07 (H) 04/02/2020 0548   CALCIUM 9.4 04/02/2020 0548   GFRNONAA 49 (L) 04/02/2020 0548   GFRAA 51 (L) 04/01/2018 1854    INR    Component Value Date/Time   INR 1.4 (H) 04/01/2020 1937     Intake/Output Summary (Last 24 hours) at 04/02/2020 0834 Last data filed at 04/02/2020 0427 Gross per 24 hour  Intake 1584.46 ml  Output 955 ml  Net 629.46 ml     Assessment/Plan:  87 y.o. female is s/p thrombectomy LLE 1 Day Post-Op   L foot warm and well perfused with PT signal by doppler Groin and popliteal incisions healing well Ok for discharge home today; will discuss vascular reconstruction of LLE vs conservative management as an outpatient    Dagoberto Ligas, PA-C [facility] [phone] 04/02/2020 8:34 AM  I have examined the patient, reviewed and agree with above.  Left popliteal and  groin incisions healing.  Plan discharge today.  We will follow up in our [place] office in several weeks with Dr. Trula Slade.  Has subacute occlusion of left limb of aorto iliac endograft.  Understands may need femorofemoral bypass if having intolerable claudication  Curt Jews, MD 04/02/2020 11:19 AM

## 2020-04-02 NOTE — Progress Notes (Signed)
Supervising Physician: Jacqulynn Cadet  Patient Status:  [facility] - In-pt  Chief Complaint: Occluded stent graft Type II endoleak repair 04/01/20 by Dr. Laurence Ferrari  Subjective: Patient sitting up in chair accompanied by daughter.  She has been able to ambulate short distances in room this AM after left leg embolectomy yesterday.    Allergies: Ace inhibitors, Contrast media [iodinated diagnostic agents], and Nsaids  Medications: Prior to Admission medications   Medication Sig Start Date End Date Taking? Authorizing Provider  amLODipine (NORVASC) 10 MG tablet TAKE 1 TABLET BY MOUTH EVERY DAY FOR BLOOD PRESSURE Patient taking differently: Take 10 mg by mouth daily. 01/24/20  Yes [name], MD  apixaban (ELIQUIS) 5 MG TABS tablet Take 5 mg by mouth 2 (two) times daily.   Yes [provider]  atorvastatin (LIPITOR) 10 MG tablet Take 1 tablet (10 mg total) by mouth daily. 03/26/19  Yes [name], MD  diclofenac sodium (VOLTAREN) 1 % GEL Apply 2 g topically daily as needed (KNEE PAIN).   Yes [provider]  diphenhydrAMINE (BENADRYL) 50 MG tablet Take 50 mg by mouth at bedtime as needed for itching.   Yes [provider]  glipiZIDE (GLUCOTROL XL) 10 MG 24 hr tablet TAKE 1 TABLET BY MOUTH EVERY DAY WITH BREAKFAST Patient taking differently: Take 10 mg by mouth daily with breakfast. 03/06/20  Yes Dutch Quint B, FNP  Lidocaine (HM LIDOCAINE PATCH) 4 % PTCH Apply 1 patch topically every 12 (twelve) hours. Patient taking differently: Apply 1 patch topically every 12 (twelve) hours as needed (pain). 67/12/45  Yes Delora Fuel, MD  losartan (COZAAR) 100 MG tablet TAKE 1 TABLET BY MOUTH EVERY DAY Patient taking differently: Take 100 mg by mouth daily. 11/26/19  Yes [name], MD  metoprolol tartrate (LOPRESSOR) 100 MG tablet TAKE 1 TABLET BY MOUTH 2 TIMES A DAY Patient taking differently: Take 100 mg by mouth 2 (two) times daily.  01/24/20  Yes [name], MD  [facility] OP Place 1 drop into the right eye 3 (three) times daily.   Yes [provider]  predniSONE (DELTASONE) 10 MG tablet Take by mouth daily with breakfast. One dose/ unsure of strength 2/14@1700 , 2300, then 2/15@ 0500   Yes [provider]  oxyCODONE-acetaminophen (PERCOCET/ROXICET) 5-325 MG tablet Take 1 tablet by mouth every 6 (six) hours as needed for moderate pain. 04/02/20   Dagoberto Ligas, PA-C     Vital Signs: BP (!) 102/58 (BP Location: Right Arm)   Pulse 67   Temp 98.4 F (36.9 C) (Oral)   Resp 19   SpO2 94%   Physical Exam  NAD, alert, sitting comfortably Lungs:  CTAB Abdomen: soft Groin: procedure site intact, clean, and dry.  No bruising or hematoma.   Imaging: No results found.  Labs:  CBC: Recent Labs    09/21/19 1040 04/01/20 0634 04/02/20 0548  WBC 4.8 4.6 9.0  HGB 13.5 12.5 9.8*  HCT 43.2 40.5 31.7*  PLT 86.0* 92* 93*    COAGS: Recent Labs    04/01/20 0634  INR 1.4*  APTT 32    BMP: Recent Labs    09/21/19 1040 04/01/20 0634 04/02/20 0548  NA 138 143 143  K 3.7 4.1 3.5  CL 106 108 110  CO2 23 23 24   GLUCOSE 118* 193* 160*  BUN 22 23 18   CALCIUM 10.5 10.6* 9.4  CREATININE 1.01 1.06* 1.07*  GFRNONAA  --  50* 49*  LIVER FUNCTION TESTS: Recent Labs    09/21/19 1040  BILITOT 0.6  AST 18  ALT 12  ALKPHOS 49  PROT 7.4  ALBUMIN 4.4    Assessment and Plan: Type II Endoleak repair 04/01/20 by Dr. Laurence Ferrari, followed by left leg embolectomy with Dr. Donnetta Hutching Patient admitted overnight s/p OR.  Assessed at bedside this AM.  Reports she is doing well, mild tenderness in her left leg with weight bearing but ambulating in room.  Has walker at home for use as needed.  Ate breakfast with tolerance this AM.  Labs reviewed and appears stable and ready for discharge home.  She understands schedulers will contact her with date and time of follow-up  appointment.   Electronically Signed: Docia Barrier, PA 04/02/2020, 12:38 PM   I spent a total of 15 Minutes at the the patient's bedside AND on the patient's hospital floor or unit, greater than 50% of which was counseling/coordinating care for type II endoleak.

## 2020-04-02 NOTE — Evaluation (Signed)
Occupational Therapy Evaluation Patient Details Name: Chelsea Dean MRN: [id number] DOB: [birth year] Today's Date: 04/02/2020    History of Present Illness 87 y.o. female is s/p thrombectomy LLE.  PMH: afib, AAA, DM   Clinical Impression   Pt PTA: Pt living with family and using W/C most of the time due to LLE pain. Pt currently, requiring minguardA overall for ADL and mobility with RW. Pt unable to stand for prolonger periods >2 mins, but able to maneuver in room 30' x2 times. Pt standing for toilet hygiene and light grooming at sink. Pt transferring to recliner  with family in room. Pt and family agree for pt to return home. No further skilled OT needs required. OT signing off. Thank you.    Follow Up Recommendations  No OT follow up;Supervision - Intermittent    Equipment Recommendations  None recommended by OT    Recommendations for Other Services       Precautions / Restrictions Precautions Precautions: Fall Restrictions Weight Bearing Restrictions: No      Mobility Bed Mobility Overal bed mobility: Needs Assistance Bed Mobility: Supine to Sit     Supine to sit: Supervision          Transfers Overall transfer level: Needs assistance Equipment used: Rolling walker (2 wheeled) Transfers: Sit to/from Stand Sit to Stand: Min guard         General transfer comment: Cues for hand placement.    Balance Overall balance assessment: Needs assistance Sitting-balance support: No upper extremity supported;Feet supported Sitting balance-Leahy Scale: Fair     Standing balance support: No upper extremity supported;During functional activity Standing balance-Leahy Scale: Fair Standing balance comment: standing to perform light grooming                           ADL either performed or assessed with clinical judgement   ADL Overall ADL's : Needs assistance/impaired Eating/Feeding: Modified independent   Grooming: Min guard;Standing   Upper Body  Bathing: Set up;Sitting   Lower Body Bathing: Min guard;Sitting/lateral leans;Sit to/from stand   Upper Body Dressing : Set up;Sitting   Lower Body Dressing: Min guard;Sitting/lateral leans;Sit to/from stand;Cueing for safety   Toilet Transfer: Min guard;RW;Ambulation   Toileting- Clothing Manipulation and Hygiene: Min guard;Sitting/lateral lean;Sit to/from stand;Cueing for safety Toileting - Clothing Manipulation Details (indicate cue type and reason): standing for task usign RW and grab bar alternating     Functional mobility during ADLs: Min guard;Rolling walker;Cueing for safety General ADL Comments: Pt requiring minguardA overall for ADL and mobility with RW. Pt unable to stand for prolonger periods >2 mins, but able to maneuver in room 30' x2 times. Pt standing for toielt hygiene and light grooming at sink. Pt transferring to recliner  with family in room     Vision Baseline Vision/History: Wears glasses Wears Glasses: Reading only Patient Visual Report: No change from baseline Vision Assessment?: No apparent visual deficits     Perception     Praxis      Pertinent Vitals/Pain Pain Assessment: No/denies pain     Hand Dominance     Extremity/Trunk Assessment Upper Extremity Assessment Upper Extremity Assessment: Generalized weakness   Lower Extremity Assessment Lower Extremity Assessment: Generalized weakness   Cervical / Trunk Assessment Cervical / Trunk Assessment: Kyphotic   Communication Communication Communication: No difficulties   Cognition Arousal/Alertness: Awake/alert Behavior During Therapy: WFL for tasks assessed/performed Overall Cognitive Status: History of cognitive impairments - at baseline  General Comments: a little confusion at times.   General Comments  VSS on RA. No pain reported.    Exercises Exercises: General Lower Extremity General Exercises - Lower Extremity Ankle Circles/Pumps:  AROM;Both;5 reps;Supine Long Arc Quad: AROM;Both;10 reps;Seated   Shoulder Instructions      Home Living Family/patient expects to be discharged to:: Private residence Living Arrangements: Children Available Help at Discharge: Family;Available 24 hours/day (daughter) Type of Home: House Home Access: Ramped entrance     Home Layout: One level     Bathroom Shower/Tub: Teacher, early years/pre: Standard     Home Equipment: Grab bars - tub/shower;Tub bench;Walker - 2 wheels;Wheelchair - manual;Cane - single point          Prior Functioning/Environment Level of Independence: Needs assistance  Gait / Transfers Assistance Needed: used cane prior to just prior to admit when she was using RW due to pain              OT Problem List: Decreased activity tolerance      OT Treatment/Interventions:      OT Goals(Current goals can be found in the care plan section) Acute Rehab OT Goals Patient Stated Goal: to go home OT Goal Formulation: All assessment and education complete, DC therapy Potential to Achieve Goals: Fair  OT Frequency:     Barriers to D/C:            Co-evaluation PT/OT/SLP Co-Evaluation/Treatment: Yes Reason for Co-Treatment: For patient/therapist safety PT goals addressed during session: Mobility/safety with mobility OT goals addressed during session: ADL's and self-care      AM-PAC OT "6 Clicks" Daily Activity     Outcome Measure Help from another person eating meals?: None Help from another person taking care of personal grooming?: None Help from another person toileting, which includes using toliet, bedpan, or urinal?: A Little Help from another person bathing (including washing, rinsing, drying)?: None Help from another person to put on and taking off regular upper body clothing?: None Help from another person to put on and taking off regular lower body clothing?: A Little 6 Click Score: 22   End of Session Equipment Utilized During  Treatment: Gait belt;Rolling walker Nurse Communication: Mobility status  Activity Tolerance: Patient tolerated treatment well Patient left: in chair;with call bell/phone within reach  OT Visit Diagnosis: Unsteadiness on feet (R26.81);Muscle weakness (generalized) (M62.81)                Time: 9379-0240 OT Time Calculation (min): 25 min Charges:  OT General Charges $OT Visit: 1 Visit OT Evaluation $OT Eval Moderate Complexity: 1 Mod  Jefferey Pica, OTR/L Acute Rehabilitation Services Pager: [phone] Office: [phone]   Marcin Holte C 04/02/2020, 3:55 PM

## 2020-04-02 NOTE — Discharge Instructions (Signed)
Incision Care, Adult An incision is a cut that a doctor makes in your skin for surgery. Most times, these cuts are closed after surgery. Your cut from surgery may be closed with:  Stitches (sutures).  Staples.  Skin glue.  Skin tape (adhesive) strips. You may need to return to your doctor to have stitches or staples taken out. This may happen many days or many weeks after your surgery. You need to take good care of your cut so it does not get infected. Follow instructions from your doctor about how to care for your cut. Supplies needed:  Soap and water.  A clean hand towel.  Wound cleanser.  A clean bandage (dressing), if needed.  Cream or ointment, if told by your doctor.  Clean gauze. How to care for your cut from surgery Cleaning your cut Ask your doctor how to clean your cut. You may need to:  Use mild soap and water, or a wound cleanser.  Use a clean gauze to pat your cut dry after you clean it. Changing your bandage  Wash your hands with soap and water for at least 20 seconds before and after you change your bandage. If you cannot use soap and water, use hand sanitizer.  Change your bandage as told by your doctor.  Leave stitches, staples, skin glue, or skin tape strips in place. They may need to stay in place for 2 weeks or longer. If tape strips get loose and curl up, you may trim the loose edges. Do not remove tape strips completely unless your doctor says it is okay.  Put a cream or ointment on your cut. Do this only as told.  Cover your cut with a clean bandage.  Ask your doctor when you can leave your cut uncovered. Checking for infection Check your cut area every day for signs of infection. Check for:  More redness, swelling, or pain.  More fluid or blood.  Warmth.  Pus or a bad smell.   Follow these instructions at home Medicines  Take over-the-counter and prescription medicines only as told by your doctor.  If you were prescribed an antibiotic  medicine, cream, or ointment, use it as told by your doctor. Do not stop using the antibiotic even if your condition improves. Eating and drinking  Eat foods that have a lot of certain nutrients, such as protein, vitamin A, and vitamin C. These foods help your cut heal. ? Foods rich in protein include meat, fish, eggs, dairy, beans, and nuts. ? Foods rich in vitamin A include carrots and dark green, leafy vegetables. ? Foods rich in vitamin C include citrus fruits, tomatoes, broccoli, and peppers.  Drink enough fluid to keep your pee (urine) pale yellow. General instructions  Do not take baths, swim, use a hot tub, or put your cut underwater until your doctor approves. Ask your doctor if you may take showers. You may only be allowed to take sponge baths.  Limit movement around your cut. This helps with healing. ? Try not to strain, lift, or exercise for the first 2 weeks, or for as long as told by your doctor. ? Return to your normal activities as told by your doctor. Ask your doctor what activities are safe for you.  Do not scratch or pick at your cut. Keep it covered as told by your doctor.  Protect your cut from the sun when you are outside for the first 6 months, or for as long as told by your doctor. Cover up  the scar area or put on sunscreen that has an SPF of at least 30.  Do not use any products that contain nicotine or tobacco, such as cigarettes, e-cigarettes, and chewing tobacco. These can delay cut healing after surgery. If you need help quitting, ask your doctor.  Keep all follow-up visits as told by your doctor. This is important.   Contact a doctor if:  You have any of these signs of infection around your cut: ? More redness, swelling, or pain. ? More fluid or blood. ? Warmth. ? Pus or a bad smell.  You have a fever.  You feel like you may vomit (nauseous).  You vomit.  You are dizzy.  Your stitches, staples, skin glue, or tape strips come undone. Get help  right away if:  Your cut has a red streak coming from it.  Your cut bleeds through your bandage, and bleeding does not stop with gentle pressure.  Your cut opens up and comes apart.  Your body reacts very badly to an infection. This may include: ? A fever, chills, or feeling cold. ? Feeling mixed up, worried, or nervous. ? Very bad pain. ? Trouble breathing. ? A fast heartbeat. ? Clammy or sweaty skin. ? A rash. These symptoms may be an emergency. Do not wait to see if the symptoms will go away. Get medical help right away. Call your local emergency services (911 in the [place] Do not drive yourself to the hospital. Summary  Follow instructions from your doctor about how to care for your cut from surgery.  Wash your hands with soap and water for at least 20 seconds before and after you change your bandage. If you cannot use soap and water, use hand sanitizer.  Check your cut area every day for signs of infection.  Keep all follow-up visits as told by your doctor. This is important. This information is not intended to replace advice given to you by your health care provider. Make sure you discuss any questions you have with your health care provider. Document Revised: 11/22/2018 Document Reviewed: 11/22/2018 Elsevier Patient Education  2021 [employer]  

## 2020-04-02 NOTE — Evaluation (Signed)
Physical Therapy Evaluation Patient Details Name: Chelsea Dean MRN: [id number] DOB: [birth year] Today's Date: 04/02/2020   History of Present Illness  87 y.o. female is s/p thrombectomy LLE.  PMH: afib, AAA, DM  Clinical Impression  Pt admitted with above diagnosis. Pt moves well with RW in room. Has support and feel that she can return home with daughter with some HHPT f/u.  Will follow acutely.   Pt currently with functional limitations due to the deficits listed below (see PT Problem List). Pt will benefit from skilled PT to increase their independence and safety with mobility to allow discharge to the venue listed below.      Follow Up Recommendations Home health PT;Supervision/Assistance - 24 hour    Equipment Recommendations  None recommended by PT    Recommendations for Other Services       Precautions / Restrictions Precautions Precautions: Fall Restrictions Weight Bearing Restrictions: No      Mobility  Bed Mobility Overal bed mobility: Needs Assistance Bed Mobility: Supine to Sit     Supine to sit: Supervision          Transfers Overall transfer level: Needs assistance Equipment used: Rolling walker (2 wheeled) Transfers: Sit to/from Stand Sit to Stand: Min guard         General transfer comment: Cues for hand placement.  Ambulation/Gait Ambulation/Gait assistance: Min guard   Assistive device: Rolling walker (2 wheeled) Gait Pattern/deviations: Step-through pattern;Decreased stride length   Gait velocity interpretation: <1.31 ft/sec, indicative of household ambulator General Gait Details: Ambulated to bathroom and then to door and back to chair. Gait steady wtih RW. Daughter present and states this is close to baseline. Of note, pt did urinate in toilet but did not remember that she did once she was in chair.  Stairs            Wheelchair Mobility    Modified Rankin (Stroke Patients Only)       Balance Overall balance assessment:  Needs assistance Sitting-balance support: No upper extremity supported;Feet supported Sitting balance-Leahy Scale: Fair     Standing balance support: No upper extremity supported;During functional activity Standing balance-Leahy Scale: Fair Standing balance comment: can stand and wash hands at sink without UE support, relies on UEs for support dynamically.                             Pertinent Vitals/Pain Pain Assessment: No/denies pain    Home Living Family/patient expects to be discharged to:: Private residence Living Arrangements: Children Available Help at Discharge: Family;Available 24 hours/day (daughter) Type of Home: House Home Access: Ramped entrance     Home Layout: One level Home Equipment: Grab bars - tub/shower;Tub bench;Walker - 2 wheels;Wheelchair - manual;Cane - single point      Prior Function Level of Independence: Needs assistance   Gait / Transfers Assistance Needed: used cane prior to just prior to admit when she was using RW due to pain           Hand Dominance        Extremity/Trunk Assessment   Upper Extremity Assessment Upper Extremity Assessment: Defer to OT evaluation    Lower Extremity Assessment Lower Extremity Assessment: Generalized weakness    Cervical / Trunk Assessment Cervical / Trunk Assessment: Kyphotic  Communication   Communication: No difficulties  Cognition Arousal/Alertness: Awake/alert Behavior During Therapy: WFL for tasks assessed/performed Overall Cognitive Status: History of cognitive impairments - at baseline  General Comments: a little confusion at times.      General Comments      Exercises General Exercises - Lower Extremity Ankle Circles/Pumps: AROM;Both;5 reps;Supine Long Arc Quad: AROM;Both;10 reps;Seated   Assessment/Plan    PT Assessment Patient needs continued PT services  PT Problem List Decreased balance;Decreased activity  tolerance;Decreased mobility;Decreased knowledge of use of DME;Decreased safety awareness;Decreased knowledge of precautions       PT Treatment Interventions DME instruction;Gait training;Functional mobility training;Therapeutic exercise;Therapeutic activities;Balance training;Patient/family education    PT Goals (Current goals can be found in the Care Plan section)  Acute Rehab PT Goals Patient Stated Goal: to go home PT Goal Formulation: With patient Time For Goal Achievement: 04/16/20 Potential to Achieve Goals: Good    Frequency Min 3X/week   Barriers to discharge        Co-evaluation PT/OT/SLP Co-Evaluation/Treatment: Yes Reason for Co-Treatment: For patient/therapist safety PT goals addressed during session: Mobility/safety with mobility         AM-PAC PT "6 Clicks" Mobility  Outcome Measure Help needed turning from your back to your side while in a flat bed without using bedrails?: None Help needed moving from lying on your back to sitting on the side of a flat bed without using bedrails?: None Help needed moving to and from a bed to a chair (including a wheelchair)?: A Little Help needed standing up from a chair using your arms (e.g., wheelchair or bedside chair)?: A Little Help needed to walk in hospital room?: A Little Help needed climbing 3-5 steps with a railing? : A Little 6 Click Score: 20    End of Session Equipment Utilized During Treatment: Gait belt Activity Tolerance: Patient limited by fatigue Patient left: in chair;with call bell/phone within reach;with chair alarm set;with family/visitor present Nurse Communication: Mobility status PT Visit Diagnosis: Muscle weakness (generalized) (M62.81)    Time:  - 9470-9628     Charges:  $Mod Evaluation 8-22 min            Emileigh Kellett W,PT Acute Rehabilitation Services Pager:  [phone]  Office:  [place] 04/02/2020, 1:34 PM

## 2020-04-02 NOTE — Progress Notes (Signed)
Patient  And family given discharge instructions medication list and follow up appointments. Patient family verbalized understanding . IV and tele were dcd. Will discharge home as ordered. All questions answered. Transported to exit via wheel chair and nursing staff. Abass Misener, Bettina Gavia RN

## 2020-04-03 ENCOUNTER — Encounter (HOSPITAL_COMMUNITY): Payer: Self-pay

## 2020-04-03 ENCOUNTER — Other Ambulatory Visit (HOSPITAL_COMMUNITY): Payer: Self-pay | Admitting: Interventional Radiology

## 2020-04-03 DIAGNOSIS — I739 Peripheral vascular disease, unspecified: Secondary | ICD-10-CM

## 2020-04-03 LAB — BPAM RBC
Blood Product Expiration Date: 202203132359
ISSUE DATE / TIME: 202202150847
Unit Type and Rh: 5100

## 2020-04-03 LAB — TYPE AND SCREEN
ABO/RH(D): O POS
Antibody Screen: NEGATIVE
Unit division: 0

## 2020-04-04 NOTE — Discharge Summary (Signed)
Discharge Summary  Patient ID: Chelsea Dean [id number] 87 y.o. 1927/08/28  Admit date: 04/01/2020  Discharge date and time: 04/02/2020  2:29 PM   Admitting Physician: Rosetta Posner, MD   Discharge Physician: same  Admission Diagnoses: Ischemic leg [I99.8]  Discharge Diagnoses: PAD  Admission Condition: poor  Discharged Condition: fair  Indication for Admission: ischemic left leg  Hospital Course: Ms. Chelsea Dean is a 87 year old female who was brought in as an outpatient by interventional radiology and underwent coil occlusion of lumbar arteries feeding native AAA sac.  During this procedure however there was distal embolization from the left iliac artery.  She was brought emergently to the operating room by Dr. Donnetta Hutching and underwent thromboembolectomy of left lower extremity.  She tolerated this procedure well and was kept overnight.  It should be noted she has a known left iliac artery occlusion however POD #1 she had a dopplerable PTA of left lower extremity.  She will follow-up in office with Dr. Trula Slade in several weeks to discuss continued conservative care versus femoral to femoral bypass graft.  She was discharged with 2 to 3 days of narcotic pain medication for continued postoperative pain control.  She was discharged in stable condition.  Consults: None  Treatments: surgery: Angiogram with coil occlusion of lumbar arteries feeding native aneurysmal sac by Dr. Laurence Ferrari on 04/01/2020  Left lower extremity thromboembolectomy by Dr. Donnetta Hutching on 04/01/2020  Discharge Exam: See progress note 04/02/20 Vitals:   04/02/20 0743 04/02/20 1300  BP: (!) 102/58 (!) 123/96  Pulse: 67 72  Resp: 19 18  Temp: 98.4 F (36.9 C) 98.2 F (36.8 C)  SpO2: 94% 95%     Disposition: Discharge disposition: 01-Home or Self Care       Patient Instructions:  Allergies as of 04/02/2020      Reactions   Ace Inhibitors Shortness Of Breath, Rash   Contrast Media [iodinated Diagnostic  Agents] Shortness Of Breath, Rash   Nsaids    Unknown reaction      Medication List    TAKE these medications   amLODipine 10 MG tablet Commonly known as: NORVASC TAKE 1 TABLET BY MOUTH EVERY DAY FOR BLOOD PRESSURE What changed:   how much to take  how to take this  when to take this  additional instructions   apixaban 5 MG Tabs tablet Commonly known as: ELIQUIS Take 5 mg by mouth 2 (two) times daily.   atorvastatin 10 MG tablet Commonly known as: LIPITOR Take 1 tablet (10 mg total) by mouth daily.   diclofenac sodium 1 % Gel Commonly known as: VOLTAREN Apply 2 g topically daily as needed (KNEE PAIN).   diphenhydrAMINE 50 MG tablet Commonly known as: BENADRYL Take 50 mg by mouth at bedtime as needed for itching.   glipiZIDE 10 MG 24 hr tablet Commonly known as: GLUCOTROL XL TAKE 1 TABLET BY MOUTH EVERY DAY WITH BREAKFAST What changed:   how much to take  how to take this  when to take this  additional instructions Notes to patient: Take as you were prior to admission    Lidocaine 4 % Ptch Commonly known as: HM Lidocaine Patch Apply 1 patch topically every 12 (twelve) hours. What changed:   when to take this  reasons to take this   losartan 100 MG tablet Commonly known as: COZAAR TAKE 1 TABLET BY MOUTH EVERY DAY   metoprolol tartrate 100 MG tablet Commonly known as: LOPRESSOR TAKE 1 TABLET BY MOUTH 2 TIMES A DAY What  changed:   how much to take  how to take this  when to take this  additional instructions   oxyCODONE-acetaminophen 5-325 MG tablet Commonly known as: PERCOCET/ROXICET Take 1 tablet by mouth every 6 (six) hours as needed for moderate pain.   PREDNISOLON-MOXIFLOX-NEPAFENAC OP Place 1 drop into the right eye 3 (three) times daily.   predniSONE 10 MG tablet Commonly known as: DELTASONE Take by mouth daily with breakfast. One dose/ unsure of strength 2/14@1700 , 2300, then 2/15@ 0500      Activity: activity as  tolerated Diet: regular diet Wound Care: keep wound clean and dry  Follow-up with Dr. Trula Slade in 3 weeks.  Signed: Dagoberto Ligas, PA-C 04/04/2020 9:51 AM [facility] Office: [phone]

## 2020-04-07 ENCOUNTER — Other Ambulatory Visit: Payer: Self-pay | Admitting: Interventional Radiology

## 2020-04-07 DIAGNOSIS — I9789 Other postprocedural complications and disorders of the circulatory system, not elsewhere classified: Secondary | ICD-10-CM

## 2020-04-15 ENCOUNTER — Other Ambulatory Visit: Payer: Self-pay | Admitting: Interventional Radiology

## 2020-04-15 ENCOUNTER — Telehealth: Payer: Self-pay | Admitting: Family Medicine

## 2020-04-15 DIAGNOSIS — M1611 Unilateral primary osteoarthritis, right hip: Secondary | ICD-10-CM | POA: Diagnosis not present

## 2020-04-15 DIAGNOSIS — R159 Full incontinence of feces: Secondary | ICD-10-CM

## 2020-04-15 DIAGNOSIS — M25751 Osteophyte, right hip: Secondary | ICD-10-CM | POA: Diagnosis not present

## 2020-04-15 NOTE — Progress Notes (Signed)
  Chronic Care Management   Outreach Note  04/15/2020 Name: Chelsea Dean MRN: [id number] DOB: 1927/10/02  Referred by: [name], MD Reason for referral : No chief complaint on file.   An unsuccessful telephone outreach was attempted today. The patient was referred to the pharmacist for assistance with care management and care coordination.   Follow Up Plan:   Carley Perdue [employer] Scheduler

## 2020-04-16 ENCOUNTER — Ambulatory Visit (HOSPITAL_COMMUNITY): Admission: RE | Admit: 2020-04-16 | Payer: Medicare Other | Source: Ambulatory Visit

## 2020-04-18 ENCOUNTER — Telehealth: Payer: Self-pay | Admitting: Family Medicine

## 2020-04-18 NOTE — Progress Notes (Signed)
  Chronic Care Management   Note  04/18/2020 Name: Chelsea Dean MRN: [id number] DOB: 03-19-1927  [name] is a 87 y.o. year old female who is a primary care patient of [name], MD. I reached out to Sharmaine Base by phone today in response to a referral sent by Ms. Jodi Geralds PCP, [name], MD.   [name] was given information about Chronic Care Management services today including:  1. CCM service includes personalized support from designated clinical staff supervised by her physician, including individualized plan of care and coordination with other care providers 2. 24/7 contact phone numbers for assistance for urgent and routine care needs. 3. Service will only be billed when office clinical staff spend 20 minutes or more in a month to coordinate care. 4. Only one practitioner may furnish and bill the service in a calendar month. 5. The patient may stop CCM services at any time (effective at the end of the month) by phone call to the office staff.   Patient agreed to services and verbal consent obtained.   Follow up plan:   Carley Perdue [employer] Scheduler

## 2020-04-24 ENCOUNTER — Other Ambulatory Visit: Payer: Self-pay | Admitting: Family Medicine

## 2020-04-30 ENCOUNTER — Encounter: Payer: Self-pay | Admitting: *Deleted

## 2020-04-30 ENCOUNTER — Other Ambulatory Visit: Payer: Self-pay

## 2020-04-30 ENCOUNTER — Ambulatory Visit
Admission: RE | Admit: 2020-04-30 | Discharge: 2020-04-30 | Disposition: A | Discharge status: Home / Self Care | Payer: Medicare Other | Source: Ambulatory Visit | Attending: Interventional Radiology | Admitting: Interventional Radiology

## 2020-04-30 DIAGNOSIS — I9789 Other postprocedural complications and disorders of the circulatory system, not elsewhere classified: Secondary | ICD-10-CM

## 2020-04-30 DIAGNOSIS — T82330D Leakage of aortic (bifurcation) graft (replacement), subsequent encounter: Secondary | ICD-10-CM | POA: Diagnosis not present

## 2020-04-30 DIAGNOSIS — I70212 Atherosclerosis of native arteries of extremities with intermittent claudication, left leg: Secondary | ICD-10-CM | POA: Diagnosis not present

## 2020-04-30 HISTORY — PX: IR RADIOLOGIST EVAL & MGMT: IMG5224

## 2020-04-30 NOTE — Progress Notes (Signed)
Chief Complaint: Patient was consulted remotely today (TeleHealth) for type 2 endoleak at the request of Burnie Therien K.    Referring Physician(s): Kaitlinn Iversen K  History of Present Illness: Chelsea Dean is a 87 y.o. female with a history of abdominal aortic aneurysm previously treated with endovascular aortic repair (Dr. Janet Berlin in January 2012) using a bifurcated Endograft. 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  Initial surveillance showed stability of the excluded aneurysm sac and no further intervention was performed. However, on the most recent CTA of the abdomen and pelvis performed 06/13/2015 the aneurysm sac measures 6.7 x 6.3 cm each is a significant enlargement compared to 5.4 cm in 2012.  She underwent endovascular repair of the type II endoleak on 08/14/2015 with coil embolization of the common trunk of the left and right L4 lumbar arteries.  CT arteriogram of the abdomen and pelvis performed 09/05/2017. There was a persistent type II endoleak, however the infrarenal abdominal aortic aneurysm sac demonstrates no significant interval growth over the past year dating back to January. However, compared to more remote prior studies from December 2017 there has been definitive growth over time. At that time, she elected to pursue continued surveillance rather than intervention.  Follow-up was not performed for some time due to the Covid pandemic.  However, on 03/05/2020 she had a follow-up CT arteriogram.  Persistent type II endoleak with further enlargement of the Endosac to 9.0 cm.  Additionally, she has developed complete occlusion of the left stent graft limb and the majority of the left external iliac artery which is new compared to 2019.  Slight interval progression of in-stent stenosis within the right stent graft limb.  On 04/01/2020 she underwent an unsuccessful attempted recanalization  of the occluded iliac limb followed by successful completion embolization of the right L4 and left L4 lumbar arteries.  Unfortunately, during the procedure some chronic thrombus within the left external iliac artery was mobilized and embolized distally into the popliteal artery requiring open surgical embolectomy.  Fortunately, she has recovered very well from this complication.  Her main complaint today is an increase in fecal incontinence.  She has having difficulty feeling the sensation of an impending bowel movement.  This is new since the time of the procedure.  Otherwise, she is at her normal state of health and denies any other new or increasing problems.  Past Medical History:  Diagnosis Date  . A-fib ([place]) 10/19/2016  . Abdominal aortic aneurysm (AAA) without rupture ([place]) 09/15/2013  . Acute idiopathic gout of right foot   . AKI (acute kidney injury) ([place]) 10/18/2016  . Anticoagulated on Coumadin   . Arthritis   . Diabetes mellitus without complication ([place])   . Diverticular disease of large intestine 04/16/2013  . Diverticulosis   . DM2 (diabetes mellitus, type 2) ([place]) 10/19/2016  . Dysrhythmia    Atrial fib  . History of colon polyps 04/16/2013  . Hx of colonic polyps   . Hyperlipidemia   . Hypertension   . Long term current use of anticoagulant therapy 02/05/2015  . Lumbar radiculopathy 03/28/2014  . Microalbuminuria 04/16/2013  . Osteoarthritis 04/16/2013  . Osteoarthritis of cervical spine 03/28/2014  . Pelvic mass 10/19/2016  . Right renal mass   . SBO (small bowel obstruction) ([place]) 10/18/2016  . Thrombocytopenia ([place]) 04/16/2013   Overview:  referred to hematology 01/2104    Past Surgical History:  Procedure Laterality Date  . ABDOMINAL  AORTIC ANEURYSM REPAIR    . ABDOMINAL HYSTERECTOMY    . IR ANGIOGRAM EXTREMITY LEFT  04/01/2020  . IR ANGIOGRAM PELVIS SELECTIVE OR SUPRASELECTIVE  04/01/2020  . IR ANGIOGRAM SELECTIVE EACH ADDITIONAL VESSEL  04/01/2020  . IR ANGIOGRAM SELECTIVE EACH  ADDITIONAL VESSEL  04/01/2020  . IR ANGIOGRAM SELECTIVE EACH ADDITIONAL VESSEL  04/01/2020  . IR AORTAGRAM ABDOMINAL SERIALOGRAM  04/01/2020  . IR EMBO ARTERIAL NOT HEMORR HEMANG INC GUIDE ROADMAPPING  04/01/2020  . IR GENERIC HISTORICAL  07/01/2015   IR RADIOLOGIST EVAL & MGMT 07/01/2015 [facility] INTERV RAD  . IR RADIOLOGIST EVAL & MGMT  03/11/2020  . IR THROMBECT PRIM MECH INIT (INCLU) MOD SED  04/01/2020  . IR US GUIDE VASC ACCESS LEFT  04/01/2020  . IR US GUIDE VASC ACCESS LEFT  04/01/2020  . IR US GUIDE VASC ACCESS RIGHT  04/01/2020  . THROMBECTOMY FEMORAL ARTERY Left 04/01/2020   Procedure: THROMBECTOMY LEFT POPLITEAL ARTERY;  Surgeon: Rosetta Posner, MD;  Location: [facility];  Service: Vascular;  Laterality: Left;    Allergies: Ace inhibitors, Contrast media [iodinated diagnostic agents], and Nsaids  Medications: Prior to Admission medications   Medication Sig Start Date End Date Taking? Authorizing Provider  amLODipine (NORVASC) 10 MG tablet TAKE 1 TABLET BY MOUTH EVERY DAY FOR BLOOD PRESSURE Patient taking differently: Take 10 mg by mouth daily. 01/24/20   [name], MD  atorvastatin (LIPITOR) 10 MG tablet Take 1 tablet (10 mg total) by mouth daily. 03/26/19   [name], MD  diclofenac sodium (VOLTAREN) 1 % GEL Apply 2 g topically daily as needed (KNEE PAIN).    [provider]  diphenhydrAMINE (BENADRYL) 50 MG tablet Take 50 mg by mouth at bedtime as needed for itching.    [provider]  ELIQUIS 5 MG TABS tablet TAKE 1 TABLET BY MOUTH TWICE DAILY FOR 30 DAYS (02/13/20 TO INDEFINITELY) 04/24/20   [name], MD  glipiZIDE (GLUCOTROL XL) 10 MG 24 hr tablet TAKE 1 TABLET BY MOUTH EVERY DAY WITH BREAKFAST Patient taking differently: Take 10 mg by mouth daily with breakfast. 03/06/20   Dutch Quint B, FNP  Lidocaine (HM LIDOCAINE PATCH) 4 % PTCH Apply 1 patch topically every 12 (twelve) hours. Patient taking differently: Apply 1 patch topically  every 12 (twelve) hours as needed (pain). 80/16/55   Delora Fuel, MD  losartan (COZAAR) 100 MG tablet TAKE 1 TABLET BY MOUTH EVERY DAY Patient taking differently: Take 100 mg by mouth daily. 11/26/19   [name], MD  metoprolol tartrate (LOPRESSOR) 100 MG tablet TAKE 1 TABLET BY MOUTH 2 TIMES A DAY Patient taking differently: Take 100 mg by mouth 2 (two) times daily. 01/24/20   [name], MD  oxyCODONE-acetaminophen (PERCOCET/ROXICET) 5-325 MG tablet Take 1 tablet by mouth every 6 (six) hours as needed for moderate pain. 04/02/20   Dagoberto Ligas, PA-C  PREDNISOLON-MOXIFLOX-NEPAFENAC OP Place 1 drop into the right eye 3 (three) times daily.    [provider]  predniSONE (DELTASONE) 10 MG tablet Take by mouth daily with breakfast. One dose/ unsure of strength 2/14@1700 , 2300, then 2/15@ 0500    [provider]     Family History  Problem Relation Age of Onset  . Cancer Sister        Rectal and Stomach    Social History   Socioeconomic History  . Marital status: Married    Spouse name: Not on file  . Number of children: Not  on file  . Years of education: Not on file  . Highest education level: Not on file  Occupational History  . Occupation: retired [employer]  . Smoking status: Never Smoker  . Smokeless tobacco: Current User    Types: Snuff  Vaping Use  . Vaping Use: Never used  Substance and Sexual Activity  . Alcohol use: No    Alcohol/week: 0.0 standard drinks  . Drug use: No  . Sexual activity: Not on file  Other Topics Concern  . Not on file  Social History Narrative   Admitted to [facility] 10/27/16   Married   Use snuff   Alcohol none   DNR   Social Determinants of Health   Financial Resource Strain: Low Risk   . Difficulty of Paying Living Expenses: Not hard at all  Food Insecurity: No Food Insecurity  . Worried About Charity fundraiser in the Last Year: Never true  . Ran Out of Food  in the Last Year: Never true  Transportation Needs: No Transportation Needs  . Lack of Transportation (Medical): No  . Lack of Transportation (Non-Medical): No  Physical Activity: Not on file  Stress: Not on file  Social Connections: Not on file    Review of Systems  Review of Systems: A 12 point ROS discussed and pertinent positives are indicated in the HPI above.  All other systems are negative.  Physical Exam No direct physical exam was performed (except for noted visual exam findings with Video Visits).   Vital Signs: There were no vitals taken for this visit.  Imaging:   Labs:  CBC: Recent Labs    09/21/19 1040 04/01/20 0634 04/02/20 0548  WBC 4.8 4.6 9.0  HGB 13.5 12.5 9.8*  HCT 43.2 40.5 31.7*  PLT 86.0* 92* 93*    COAGS: Recent Labs    04/01/20 0634  INR 1.4*  APTT 32    BMP: Recent Labs    09/21/19 1040 04/01/20 0634 04/02/20 0548  NA 138 143 143  K 3.7 4.1 3.5  CL 106 108 110  CO2 23 23 24   GLUCOSE 118* 193* 160*  BUN 22 23 18   CALCIUM 10.5 10.6* 9.4  CREATININE 1.01 1.06* 1.07*  GFRNONAA  --  50* 49*    LIVER FUNCTION TESTS: Recent Labs    09/21/19 1040  BILITOT 0.6  AST 18  ALT 12  ALKPHOS 49  PROT 7.4  ALBUMIN 4.4    TUMOR MARKERS: No results for input(s): AFPTM, CEA, CA199, CHROMGRNA in the last 8760 hours.  Assessment and Plan:  Overall doing well status post repeat endovascular repair of type II endoleak.  She has recovered well from her surgical embolectomy for distal emboli sustained during the procedure.  Unfortunately, her only complaint has been new intermittent fecal incontinence and decreased ability to detect an impending bowel movement.  My concern is that by embolizing the L4 lumbar arteries and shutting down that pathway through the aneurysm sac, her left lower extremity is now recruiting blood flow from additional collateral vessels and may be resulting in some steal from the conus medullaris and spinal nerves  resulting in decreased rectal tone.  If this is the case, this is a challenging problem.  Her blood flow in that region is significantly compromised by the presence of the aortic endograft, the occluded left iliac limb and the prior embolization of the right and left L4 lumbar arteries.  She has a follow-up appointment  with Dr. Donnetta Hutching next week.  At that time, they are going to discuss the possibility of a right to left femorofemoral bypass graft.  It is possible that providing more direct blood flow to the left lower extremity may decrease the degree of collateral use which in turn could reduce any ongoing steal from the conus and spinal nerves.  However, I did explain to Chelsea Dean and her daughter that if there has been an infarct in the region, returning the blood flow may not reverse the clinical symptoms.  This may represent Chelsea Dean is new baseline and be an issue that has to be dealt with from this point forward.  1.)  Follow-up CT arteriogram of the abdomen and pelvis and clinic visit in 1 year.    Electronically Signed: Criselda Peaches 04/30/2020, 2:43 PM   I spent a total of 15 Minutes in remote  clinical consultation, greater than 50% of which was counseling/coordinating care for type 2 endoleak repair    Visit type: Audio only (telephone). Audio (no video) only due to patient preference. Alternative for in-person consultation at [facility], [address] [place], [place], [place]. This visit type was conducted due to national recommendations for restrictions regarding the COVID-19 Pandemic (e.g. social distancing).  This format is felt to be most appropriate for this patient at this time.  All issues noted in this document were discussed and addressed.

## 2020-05-05 ENCOUNTER — Ambulatory Visit (INDEPENDENT_AMBULATORY_CARE_PROVIDER_SITE_OTHER): Payer: Medicare Other | Admitting: Surgery

## 2020-05-05 ENCOUNTER — Other Ambulatory Visit: Payer: Self-pay

## 2020-05-05 VITALS — BP 118/71 | HR 72 | Temp 97.5°F | Resp 18 | Ht 62.0 in | Wt 164.0 lb

## 2020-05-05 DIAGNOSIS — I70213 Atherosclerosis of native arteries of extremities with intermittent claudication, bilateral legs: Secondary | ICD-10-CM

## 2020-05-05 NOTE — Progress Notes (Signed)
Patient name: Chelsea Dean MRN: [id number] DOB: 08-30-1927 Sex: female  REASON FOR VISIT:     post op  HISTORY OF PRESENT ILLNESS:   Chelsea Dean is a 87 y.o. female who was initially seen by Dr. Donnetta Hutching as a consult on 04/01/2020 following a interventional radiology procedure.  The patient has a history of a abdominal aortic aneurysm performed in [facility].  She has had progressive expansion of her aneurysm sac with endoleak.  She was scheduled for elective coiling of her endoleak through a left femoral approach.  The left limb of her graft is known to be occluded, however was patent in June 2019.  She had an ischemic leg secondary to an embolus and so she went to the operating room for embolectomy via a below-knee incision.  The left femoral sheath was also removed under direct visualization.  The patient suffers from atrial fibrillation and is anticoagulated with Coumadin.  She is a diabetic.  She takes a statin for hypercholesterolemia.  She is medically managed for hypertension.  CURRENT MEDICATIONS:    Current Outpatient Medications  Medication Sig Dispense Refill  . amLODipine (NORVASC) 10 MG tablet TAKE 1 TABLET BY MOUTH EVERY DAY FOR BLOOD PRESSURE (Patient taking differently: Take 10 mg by mouth daily.) 90 tablet 1  . atorvastatin (LIPITOR) 10 MG tablet Take 1 tablet (10 mg total) by mouth daily. 90 tablet 3  . diclofenac sodium (VOLTAREN) 1 % GEL Apply 2 g topically daily as needed (KNEE PAIN).    Marland Kitchen diphenhydrAMINE (BENADRYL) 50 MG tablet Take 50 mg by mouth at bedtime as needed for itching.    Marland Kitchen ELIQUIS 5 MG TABS tablet TAKE 1 TABLET BY MOUTH TWICE DAILY FOR 30 DAYS (02/13/20 TO INDEFINITELY) 60 tablet 5  . glipiZIDE (GLUCOTROL XL) 10 MG 24 hr tablet TAKE 1 TABLET BY MOUTH EVERY DAY WITH BREAKFAST (Patient taking differently: Take 10 mg by mouth daily with breakfast.) 90 tablet 2  . Lidocaine (HM LIDOCAINE PATCH) 4 % PTCH Apply 1 patch  topically every 12 (twelve) hours. (Patient taking differently: Apply 1 patch topically every 12 (twelve) hours as needed (pain).) 14 patch 0  . losartan (COZAAR) 100 MG tablet TAKE 1 TABLET BY MOUTH EVERY DAY (Patient taking differently: Take 100 mg by mouth daily.) 90 tablet 1  . metoprolol tartrate (LOPRESSOR) 100 MG tablet TAKE 1 TABLET BY MOUTH 2 TIMES A DAY (Patient taking differently: Take 100 mg by mouth 2 (two) times daily.) 180 tablet 1   No current facility-administered medications for this visit.    REVIEW OF SYSTEMS:   [X]  denotes positive finding, [ ]  denotes negative finding Cardiac  Comments:  Chest pain or chest pressure:    Shortness of breath upon exertion:    Short of breath when lying flat:    Irregular heart rhythm:    Constitutional    Fever or chills:      PHYSICAL EXAM:   Vitals:   05/05/20 0829  BP: 118/71  Pulse: 72  Resp: 18  Temp: (!) 97.5 F (36.4 C)  SpO2: 95%  Weight: 164 lb (74.4 kg)  Height: 5\' 2"  (1.575 m)    GENERAL: The patient is a well-nourished female, in no acute distress. The vital signs are documented above. CARDIOVASCULAR: There is a regular rate and rhythm. PULMONARY: Non-labored respirations Groin and popliteal incision are healed.  She has bilateral edema  STUDIES:   None   MEDICAL ISSUES:   Left limb aortic graft occlusion: The  patient is not symptomatic from this.  Her walking is limited by her arthritis in her knee.  I do not think that she needs a femoral-femoral bypass graft at this time.  I told the patient and her daughter to monitor her feet on a daily basis and that if they notice a blister or ulcer developing to contact us immediately.  The patient is concerned about some bowel incontinence that has developed following her procedure.  I told her that I do not think this has anything to do with her blood supply and that she should discuss this further with her primary care physician  The patient will follow up in  6 months with ABIs and check of her symptoms in her left leg.  Leia Alf, MD, FACS [facility] [phone] Pager [phone]

## 2020-05-05 NOTE — Progress Notes (Deleted)
Chronic Care Management Pharmacy Note  05/05/2020 Name:  Chelsea Dean MRN:  [id number] DOB:  04-28-1927  Subjective: Chelsea Dean is an 87 y.o. year old female who is a primary patient of [name], MD.  The CCM team was consulted for assistance with disease management and care coordination needs.    Engaged with patient by telephone for initial visit in response to provider referral for pharmacy case management and/or care coordination services.   Consent to Services:  The patient was given the following information about Chronic Care Management services today, agreed to services, and gave verbal consent: 1. CCM service includes personalized support from designated clinical staff supervised by the primary care provider, including individualized plan of care and coordination with other care providers 2. 24/7 contact phone numbers for assistance for urgent and routine care needs. 3. Service will only be billed when office clinical staff spend 20 minutes or more in a month to coordinate care. 4. Only one practitioner may furnish and bill the service in a calendar month. 5.The patient may stop CCM services at any time (effective at the end of the month) by phone call to the office staff. 6. The patient will be responsible for cost sharing (co-pay) of up to 20% of the service fee (after annual deductible is met). Patient agreed to services and consent obtained.  Patient Care Team: [name], MD as PCP - General (Family Medicine) Germaine Pomfret, [facility] as Pharmacist (Pharmacist)  Recent office visits: 03/25/20: Patient presented to Dr. Gena Fray for follow-up. Diphenhydramine, PEG, prednisone, tramadol, warfarin stopped. Eliquis started.  02/19/20: Patient presented to Dutch Quint, FNP for acute knee pain. Patient started on Tramadol.  09/21/19: Patient presented to Dr. Ethelene Hal for follow-up.   Recent consult visits: 05/05/20: Patient presented to Dr. Trula Slade (Vascular) for  post-op follow-up. Oxycodone-APAP, prednisolone, prednisone stopped.   Hospital visits: 2/15-2/16/22: Patient hospitalized for AAA thrombectomy of left popliteal artery.   02/11/20: Patient presented to ED for bleeding. 02/08/20: Patient presented to ED for gout of left knee.   Objective:  Lab Results  Component Value Date   CREATININE 1.07 (H) 04/02/2020   BUN 18 04/02/2020   GFR 51.45 (L) 09/21/2019   GFRNONAA 49 (L) 04/02/2020   GFRAA 51 (L) 04/01/2018   NA 143 04/02/2020   K 3.5 04/02/2020   CALCIUM 9.4 04/02/2020   CO2 24 04/02/2020   GLUCOSE 160 (H) 04/02/2020    Lab Results  Component Value Date/Time   HGBA1C 6.5 09/21/2019 10:40 AM   HGBA1C 6.6 (H) 03/26/2019 11:58 AM   GFR 51.45 (L) 09/21/2019 10:40 AM   GFR 49.80 (L) 03/26/2019 11:58 AM   MICROALBUR 83.5 (H) 09/21/2019 10:51 AM   MICROALBUR 37.1 (H) 03/26/2019 11:58 AM    Last diabetic Eye exam: No results found for: HMDIABEYEEXA  Last diabetic Foot exam: No results found for: HMDIABFOOTEX   Lab Results  Component Value Date   CHOL 130 04/02/2020   HDL 31 (L) 04/02/2020   LDLCALC 84 04/02/2020   LDLDIRECT 81.0 09/21/2019   TRIG 74 04/02/2020   CHOLHDL 4.2 04/02/2020    Hepatic Function Latest Ref Rng & Units 09/21/2019 08/15/2017 03/29/2017  Total Protein 6.0 - 8.3 g/dL 7.4 7.7 6.5  Albumin 3.5 - 5.2 g/dL 4.4 4.4 3.3(L)  AST 0 - 37 U/L 18 20 17   ALT 0 - 35 U/L 12 17 16   Alk Phosphatase 39 - 117 U/L 49 62 53  Total Bilirubin 0.2 - 1.2 mg/dL  0.6 0.5 1.0    Lab Results  Component Value Date/Time   TSH 3.26 03/02/2017 02:42 PM    CBC Latest Ref Rng & Units 04/02/2020 04/01/2020 09/21/2019  WBC 4.0 - 10.5 K/uL 9.0 4.6 4.8  Hemoglobin 12.0 - 15.0 g/dL 9.8(L) 12.5 13.5  Hematocrit 36.0 - 46.0 % 31.7(L) 40.5 43.2  Platelets 150 - 400 K/uL 93(L) 92(L) 86.0(L)    No results found for: VD25OH  Clinical ASCVD: No  The ASCVD Risk score Mikey Bussing DC Jr., et al., 2013) failed to calculate for the following  reasons:   The 2013 ASCVD risk score is only valid for ages 59 to 53    Depression screen PHQ 2/9 03/25/2020 05/09/2019 03/26/2019  Decreased Interest 0 0 0  Down, Depressed, Hopeless 0 0 0  PHQ - 2 Score 0 0 0  Altered sleeping - - 0  Tired, decreased energy - - 0  Change in appetite - - 0  Feeling bad or failure about yourself  - - 0  Trouble concentrating - - 0  Moving slowly or fidgety/restless - - 0  Suicidal thoughts - - 0  PHQ-9 Score - - 0     ***Other: (CHADS2VASc if Afib, MMRC or CAT for COPD, ACT, DEXA)  Social History   Tobacco Use  Smoking Status Never Smoker  Smokeless Tobacco Current User  . Types: Snuff   BP Readings from Last 3 Encounters:  05/05/20 118/71  04/02/20 (!) 123/96  04/01/20 (!) 166/88   Pulse Readings from Last 3 Encounters:  05/05/20 72  04/02/20 72  04/01/20 73   Wt Readings from Last 3 Encounters:  05/05/20 164 lb (74.4 kg)  04/01/20 164 lb 14.5 oz (74.8 kg)  03/25/20 165 lb (74.8 kg)   BMI Readings from Last 3 Encounters:  05/05/20 30.00 kg/m  04/01/20 30.16 kg/m  03/25/20 30.18 kg/m    Assessment/Interventions: Review of patient past medical history, allergies, medications, health status, including review of consultants reports, laboratory and other test data, was performed as part of comprehensive evaluation and provision of chronic care management services.   SDOH:  (Social Determinants of Health) assessments and interventions performed: Yes   CCM Care Plan  Allergies  Allergen Reactions  . Ace Inhibitors Shortness Of Breath and Rash  . Contrast Media [Iodinated Diagnostic Agents] Shortness Of Breath and Rash  . Nsaids     Unknown reaction    Medications Reviewed Today    Reviewed by Leota Jacobsen, LPN (Licensed Practical Nurse) on 05/05/20 at [phone]  Med List Status: <None>  Medication Order Taking? Sig Documenting Provider Last Dose Status Informant  amLODipine (NORVASC) 10 MG tablet [id number] Yes TAKE 1 TABLET  BY MOUTH EVERY DAY FOR BLOOD PRESSURE  Patient taking differently: Take 10 mg by mouth daily.   [name], MD Taking Active   atorvastatin (LIPITOR) 10 MG tablet [id number] Yes Take 1 tablet (10 mg total) by mouth daily. [name], MD Taking Active Child  diclofenac sodium (VOLTAREN) 1 % GEL [id number] Yes Apply 2 g topically daily as needed (KNEE PAIN). [provider] Taking Active Child           Med Note Caryn Section, [place] A   Wed Mar 26, 2020 11:11 AM)    diphenhydrAMINE (BENADRYL) 50 MG tablet [id number] Yes Take 50 mg by mouth at bedtime as needed for itching. [provider] Taking Active Child  ELIQUIS 5 MG TABS tablet [id number] Yes TAKE 1 TABLET BY MOUTH TWICE  DAILY FOR 30 DAYS (02/13/20 TO INDEFINITELY) [name], MD Taking Active   glipiZIDE (GLUCOTROL XL) 10 MG 24 hr tablet [id number] Yes TAKE 1 TABLET BY MOUTH EVERY DAY WITH BREAKFAST  Patient taking differently: Take 10 mg by mouth daily with breakfast.   Dutch Quint B, FNP Taking Active   Lidocaine (HM LIDOCAINE PATCH) 4 % PTCH [id number] Yes Apply 1 patch topically every 12 (twelve) hours.  Patient taking differently: Apply 1 patch topically every 12 (twelve) hours as needed (pain).   Delora Fuel, MD Taking Active Family Member           Med Note Caryn Section, [place] A   Wed Mar 26, 2020 11:12 AM)    losartan (COZAAR) 100 MG tablet [id number] Yes TAKE 1 TABLET BY MOUTH EVERY DAY  Patient taking differently: Take 100 mg by mouth daily.   [name], MD Taking Active Family Member  metoprolol tartrate (LOPRESSOR) 100 MG tablet [id number] Yes TAKE 1 TABLET BY MOUTH 2 TIMES A DAY  Patient taking differently: Take 100 mg by mouth 2 (two) times daily.   [name], MD Taking Active Family Member          Patient Active Problem List   Diagnosis Date Noted  . Ischemic leg 04/01/2020  . Stage 4 chronic kidney disease ([place]) 09/21/2019  . Elevated LDL cholesterol  level 07/19/2018  . Need for influenza vaccination 12/28/2017  . Chronic pain of right knee 10/19/2017  . Stage 3 chronic kidney disease ([place]) 07/25/2017  . Hypomagnesemia 03/31/2017  . 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/31/2017  . Abnormal urinalysis 03/16/2017  . Hypokalemia 10/31/2016  . Lactic acidosis 10/31/2016  . Atrial fibrillation with RVR ([place]) 10/31/2016  . Elevated liver enzymes 10/31/2016  . Anticoagulated on Coumadin 10/31/2016  . Elevated INR 10/31/2016  . Hyperlipidemia 10/31/2016  . Essential hypertension 10/31/2016  . Acute idiopathic gout of right foot   . A-fib ([place]) 10/19/2016  . Type 2 diabetes mellitus without complication, without long-term current use of insulin ([place]) 10/19/2016  . Pelvic mass 10/19/2016  . SBO (small bowel obstruction) ([place]) 10/18/2016  . Long term current use of anticoagulant therapy 02/05/2015  . Lumbar radiculopathy 03/28/2014  . Osteoarthritis of cervical spine 03/28/2014  . Abdominal aortic aneurysm (AAA) without rupture ([place]) 09/15/2013  . Diverticular disease of large intestine 04/16/2013  . History of colon polyps 04/16/2013  . Microalbuminuria 04/16/2013  . Osteoarthritis 04/16/2013  . Thrombocytopenia ([place]) 04/16/2013    Immunization History  Administered Date(s) Administered  . Fluad Quad(high Dose 65+) 12/05/2018, 12/05/2019  . Influenza, High Dose Seasonal PF 01/17/2014, 10/23/2014, 12/28/2017  . Influenza,inj,Quad PF,6+ Mos 11/21/2015  . Influenza-Unspecified 01/02/2013, 02/23/2013  . Moderna Sars-Covid-2 Vaccination 04/17/2019, 05/15/2019  . PPD Test 10/27/2016  . Pneumococcal Conjugate-13 10/23/2014  . Pneumococcal Polysaccharide-23 08/29/2007    Conditions to be addressed/monitored:  Hypertension, Hyperlipidemia, Diabetes, Atrial Fibrillation and Chronic Kidney Disease  There are no care plans that you recently modified to display for this patient.    Medication Assistance: {MEDASSISTANCEINFO:25044}  Patient's preferred  pharmacy is:  [place], [place] - [address] [place] [address] Phone: [phone] Fax: [phone]  [id number] - [place], [place] AT [place] [address] Phone: [phone] Fax: [facility] #[id number] - [place], [place] - [address] AT [facility] OF  [place] [address] [place]  [address] Phone: [phone] Fax: [phone]  Uses pill box? {Yes or If no, why not?:20788} Pt endorses ***% compliance  We discussed: {Pharmacy options:24294} Patient decided to: {US Pharmacy Plan:23885}  Care Plan and Follow Up Patient Decision:  {FOLLOWUP:24991}  Plan: {CM FOLLOW UP PLAN:25073}  ***   Current Barriers:  . {pharmacybarriers:24917} . ***  Pharmacist Clinical Goal(s):  Marland Kitchen Patient will {PHARMACYGOALCHOICES:24921} through collaboration with PharmD and provider.  . ***  Interventions: . 1:1 collaboration with [name], MD regarding development and update of comprehensive plan of care as evidenced by provider attestation and co-signature . Inter-disciplinary care team collaboration (see longitudinal plan of care) . Comprehensive medication review performed; medication list updated in electronic medical record  Hypertension (BP goal {CHL HP UPSTREAM Pharmacist BP ranges:[phone]}) -{US controlled/uncontrolled:25276} -Current treatment: . Amlodipine 10 mg daily  . Losartan 100 mg daily  . Metoprolol Tartrate 100 mg twice daily  -Medications previously tried: ***  -Current home readings: *** -Current dietary habits: *** -Current exercise habits: *** -{ACTIONS;DENIES/REPORTS:21021675::"Denies"} hypotensive/hypertensive symptoms -Educated on {CCM BP Counseling:25124} -Counseled to monitor BP at home ***, document, and provide log at future  appointments -{CCMPHARMDINTERVENTION:25122}  Hyperlipidemia: (LDL goal < ***) -{US controlled/uncontrolled:25276} -Current treatment: . Atorvastatin 10 mg daily  -Medications previously tried: ***  -Current dietary patterns: *** -Current exercise habits: *** -Educated on {CCM HLD Counseling:25126} -{CCMPHARMDINTERVENTION:25122}  Diabetes (A1c goal {A1c goals:23924}) -{US controlled/uncontrolled:25276} -Current medications: Marland Kitchen Glipizide XL 10 mg daily  -Medications previously tried: ***  -Current home glucose readings . fasting glucose: *** . post prandial glucose: *** -{ACTIONS;DENIES/REPORTS:21021675::"Denies"} hypoglycemic/hyperglycemic symptoms -Current meal patterns:  . breakfast: ***  . lunch: ***  . dinner: *** . snacks: *** . drinks: *** -Current exercise: *** -Educated on {CCM DM COUNSELING:25123} -Counseled to check feet daily and get yearly eye exams -{CCMPHARMDINTERVENTION:25122}  Atrial Fibrillation (Goal: prevent stroke and major bleeding) -{US controlled/uncontrolled:25276} -CHADSVASC: 6 -Current treatment: . Rate control: Metoprolol Tartrate 100 mg twice daily  . Anticoagulation: Eliquis 5 mg twice daily  -Medications previously tried: *** -Home BP and HR readings: ***  -Counseled on {CCMAFIBCOUNSELING:25120} -{CCMPHARMDINTERVENTION:25122}   Patient Goals/Self-Care Activities . Patient will:  - {pharmacypatientgoals:24919}  Follow Up Plan: {CM FOLLOW UP HIDU:37357}

## 2020-05-06 ENCOUNTER — Ambulatory Visit (INDEPENDENT_AMBULATORY_CARE_PROVIDER_SITE_OTHER): Payer: Medicare Other

## 2020-05-06 ENCOUNTER — Telehealth: Payer: Medicare Other

## 2020-05-06 DIAGNOSIS — E1169 Type 2 diabetes mellitus with other specified complication: Secondary | ICD-10-CM | POA: Diagnosis not present

## 2020-05-06 DIAGNOSIS — E119 Type 2 diabetes mellitus without complications: Secondary | ICD-10-CM | POA: Diagnosis not present

## 2020-05-06 DIAGNOSIS — E785 Hyperlipidemia, unspecified: Secondary | ICD-10-CM | POA: Diagnosis not present

## 2020-05-06 DIAGNOSIS — E1159 Type 2 diabetes mellitus with other circulatory complications: Secondary | ICD-10-CM | POA: Diagnosis not present

## 2020-05-06 DIAGNOSIS — N184 Chronic kidney disease, stage 4 (severe): Secondary | ICD-10-CM

## 2020-05-06 DIAGNOSIS — I152 Hypertension secondary to endocrine disorders: Secondary | ICD-10-CM | POA: Diagnosis not present

## 2020-05-06 DIAGNOSIS — I4811 Longstanding persistent atrial fibrillation: Secondary | ICD-10-CM | POA: Diagnosis not present

## 2020-05-06 NOTE — Progress Notes (Signed)
Chronic Care Management Pharmacy Note  05/08/2020 Name:  Chelsea Dean MRN:  [id number] DOB:  04-16-1927  Subjective: Chelsea Dean is an 87 y.o. year old female who is a primary patient of [name], MD.  The CCM team was consulted for assistance with disease management and care coordination needs.    Engaged with patient by telephone for initial visit in response to provider referral for pharmacy case management and/or care coordination services.   Consent to Services:  The patient was given the following information about Chronic Care Management services today, agreed to services, and gave verbal consent: 1. CCM service includes personalized support from designated clinical staff supervised by the primary care provider, including individualized plan of care and coordination with other care providers 2. 24/7 contact phone numbers for assistance for urgent and routine care needs. 3. Service will only be billed when office clinical staff spend 20 minutes or more in a month to coordinate care. 4. Only one practitioner may furnish and bill the service in a calendar month. 5.The patient may stop CCM services at any time (effective at the end of the month) by phone call to the office staff. 6. The patient will be responsible for cost sharing (co-pay) of up to 20% of the service fee (after annual deductible is met). Patient agreed to services and consent obtained.  Patient Care Team: [name], MD as PCP - General (Family Medicine) Germaine Pomfret, [facility] as Pharmacist (Pharmacist)  Recent office visits: 03/25/20: Patient presented to Dr. Gena Fray for follow-up. Diphenhydramine, PEG, prednisone, tramadol, warfarin stopped. Eliquis started.  02/19/20: Patient presented to Dutch Quint, FNP for acute knee pain. Patient started on Tramadol.  09/21/19: Patient presented to Dr. Ethelene Hal for follow-up.   Recent consult visits: 05/05/20: Patient presented to Dr. Trula Slade (Vascular) for  post-op follow-up. Oxycodone-APAP, prednisolone, prednisone stopped.   Hospital visits: 2/15-2/16/22: Patient hospitalized for AAA thrombectomy of left popliteal artery.   02/11/20: Patient presented to ED for bleeding. 02/08/20: Patient presented to ED for gout of left knee.   Objective:  Lab Results  Component Value Date   CREATININE 1.07 (H) 04/02/2020   BUN 18 04/02/2020   GFR 51.45 (L) 09/21/2019   GFRNONAA 49 (L) 04/02/2020   GFRAA 51 (L) 04/01/2018   NA 143 04/02/2020   K 3.5 04/02/2020   CALCIUM 9.4 04/02/2020   CO2 24 04/02/2020   GLUCOSE 160 (H) 04/02/2020    Lab Results  Component Value Date/Time   HGBA1C 6.5 09/21/2019 10:40 AM   HGBA1C 6.6 (H) 03/26/2019 11:58 AM   GFR 51.45 (L) 09/21/2019 10:40 AM   GFR 49.80 (L) 03/26/2019 11:58 AM   MICROALBUR 83.5 (H) 09/21/2019 10:51 AM   MICROALBUR 37.1 (H) 03/26/2019 11:58 AM    Last diabetic Eye exam: No results found for: HMDIABEYEEXA  Last diabetic Foot exam: No results found for: HMDIABFOOTEX   Lab Results  Component Value Date   CHOL 130 04/02/2020   HDL 31 (L) 04/02/2020   LDLCALC 84 04/02/2020   LDLDIRECT 81.0 09/21/2019   TRIG 74 04/02/2020   CHOLHDL 4.2 04/02/2020    Hepatic Function Latest Ref Rng & Units 09/21/2019 08/15/2017 03/29/2017  Total Protein 6.0 - 8.3 g/dL 7.4 7.7 6.5  Albumin 3.5 - 5.2 g/dL 4.4 4.4 3.3(L)  AST 0 - 37 U/L 18 20 17   ALT 0 - 35 U/L 12 17 16   Alk Phosphatase 39 - 117 U/L 49 62 53  Total Bilirubin 0.2 - 1.2 mg/dL  0.6 0.5 1.0    Lab Results  Component Value Date/Time   TSH 3.26 03/02/2017 02:42 PM    CBC Latest Ref Rng & Units 04/02/2020 04/01/2020 09/21/2019  WBC 4.0 - 10.5 K/uL 9.0 4.6 4.8  Hemoglobin 12.0 - 15.0 g/dL 9.8(L) 12.5 13.5  Hematocrit 36.0 - 46.0 % 31.7(L) 40.5 43.2  Platelets 150 - 400 K/uL 93(L) 92(L) 86.0(L)    No results found for: VD25OH  Clinical ASCVD: No  The ASCVD Risk score Mikey Bussing DC Jr., et al., 2013) failed to calculate for the following  reasons:   The 2013 ASCVD risk score is only valid for ages 73 to 75    Depression screen PHQ 2/9 03/25/2020 05/09/2019 03/26/2019  Decreased Interest 0 0 0  Down, Depressed, Hopeless 0 0 0  PHQ - 2 Score 0 0 0  Altered sleeping - - 0  Tired, decreased energy - - 0  Change in appetite - - 0  Feeling bad or failure about yourself  - - 0  Trouble concentrating - - 0  Moving slowly or fidgety/restless - - 0  Suicidal thoughts - - 0  PHQ-9 Score - - 0     Social History   Tobacco Use  Smoking Status Never Smoker  Smokeless Tobacco Current User  . Types: Snuff   BP Readings from Last 3 Encounters:  05/05/20 118/71  04/02/20 (!) 123/96  04/01/20 (!) 166/88   Pulse Readings from Last 3 Encounters:  05/05/20 72  04/02/20 72  04/01/20 73   Wt Readings from Last 3 Encounters:  05/05/20 164 lb (74.4 kg)  04/01/20 164 lb 14.5 oz (74.8 kg)  03/25/20 165 lb (74.8 kg)   BMI Readings from Last 3 Encounters:  05/05/20 30.00 kg/m  04/01/20 30.16 kg/m  03/25/20 30.18 kg/m    Assessment/Interventions: Review of patient past medical history, allergies, medications, health status, including review of consultants reports, laboratory and other test data, was performed as part of comprehensive evaluation and provision of chronic care management services.   SDOH:  (Social Determinants of Health) assessments and interventions performed: Yes SDOH Interventions   Flowsheet Row Most Recent Value  SDOH Interventions   Financial Strain Interventions Intervention Not Indicated      CCM Care Plan  Allergies  Allergen Reactions  . Ace Inhibitors Shortness Of Breath and Rash  . Contrast Media [Iodinated Diagnostic Agents] Shortness Of Breath and Rash  . Nsaids     Unknown reaction    Medications Reviewed Today    Reviewed by Germaine Pomfret, [facility] (Pharmacist) on 05/06/20 at [place] List Status: <None>  Medication Order Taking? Sig Documenting Provider Last Dose Status Informant   acetaminophen (TYLENOL) 650 MG CR tablet [id number] Yes Take 650-1,300 mg by mouth every 8 (eight) hours as needed for pain. [provider] Taking Active   amLODipine (NORVASC) 10 MG tablet [id number] Yes TAKE 1 TABLET BY MOUTH EVERY DAY FOR BLOOD PRESSURE [name], MD Taking Active   atorvastatin (LIPITOR) 10 MG tablet [id number] Yes Take 1 tablet (10 mg total) by mouth daily. [name], MD Taking Active Child  diclofenac sodium (VOLTAREN) 1 % GEL [id number] Yes Apply 2 g topically daily as needed (KNEE PAIN). [provider] Taking Active Child           Med Note Jilda Roche A   Wed Mar 26, 2020 11:11 AM)    diphenhydrAMINE (BENADRYL) 50 MG tablet [id number]  Take 50 mg by mouth daily  as needed (Contrast Dye). [provider]  Active Child  ELIQUIS 5 MG TABS tablet [id number] Yes TAKE 1 TABLET BY MOUTH TWICE DAILY FOR 30 DAYS (02/13/20 TO INDEFINITELY) [name], MD Taking Active   glipiZIDE (GLUCOTROL XL) 10 MG 24 hr tablet [id number] Yes TAKE 1 TABLET BY MOUTH EVERY DAY WITH BREAKFAST Dutch Quint B, FNP Taking Active   Lidocaine (HM LIDOCAINE PATCH) 4 % PTCH [id number] Yes Apply 1 patch topically every 12 (twelve) hours. Delora Fuel, MD Taking Active Family Member           Med Note Jilda Roche A   Wed Mar 26, 2020 11:12 AM)    losartan (COZAAR) 100 MG tablet [id number] Yes TAKE 1 TABLET BY MOUTH EVERY DAY [name], MD Taking Active Family Member  metoprolol tartrate (LOPRESSOR) 100 MG tablet [id number] Yes TAKE 1 TABLET BY MOUTH 2 TIMES A DAY [name], MD Taking Active Family Member          Patient Active Problem List   Diagnosis Date Noted  . Ischemic leg 04/01/2020  . Stage 4 chronic kidney disease ([place]) 09/21/2019  . Elevated LDL cholesterol level 07/19/2018  . Need for influenza vaccination 12/28/2017  . Chronic pain of right knee 10/19/2017  . Stage 3 chronic kidney disease ([place])  07/25/2017  . Hypomagnesemia 03/31/2017  . 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/31/2017  . Abnormal urinalysis 03/16/2017  . Hypokalemia 10/31/2016  . Lactic acidosis 10/31/2016  . Atrial fibrillation with RVR ([place]) 10/31/2016  . Elevated liver enzymes 10/31/2016  . Anticoagulated on Coumadin 10/31/2016  . Elevated INR 10/31/2016  . Hyperlipidemia 10/31/2016  . Essential hypertension 10/31/2016  . Acute idiopathic gout of right foot   . A-fib ([place]) 10/19/2016  . Type 2 diabetes mellitus without complication, without long-term current use of insulin ([place]) 10/19/2016  . Pelvic mass 10/19/2016  . SBO (small bowel obstruction) ([place]) 10/18/2016  . Long term current use of anticoagulant therapy 02/05/2015  . Lumbar radiculopathy 03/28/2014  . Osteoarthritis of cervical spine 03/28/2014  . Abdominal aortic aneurysm (AAA) without rupture ([place]) 09/15/2013  . Diverticular disease of large intestine 04/16/2013  . History of colon polyps 04/16/2013  . Microalbuminuria 04/16/2013  . Osteoarthritis 04/16/2013  . Thrombocytopenia ([place]) 04/16/2013    Immunization History  Administered Date(s) Administered  . Fluad Quad(high Dose 65+) 12/05/2018, 12/05/2019  . Influenza, High Dose Seasonal PF 01/17/2014, 10/23/2014, 12/28/2017  . Influenza,inj,Quad PF,6+ Mos 11/21/2015  . Influenza-Unspecified 01/02/2013, 02/23/2013  . Moderna Sars-Covid-2 Vaccination 04/17/2019, 05/15/2019  . PPD Test 10/27/2016  . Pneumococcal Conjugate-13 10/23/2014  . Pneumococcal Polysaccharide-23 08/29/2007    Conditions to be addressed/monitored:  Hypertension, Hyperlipidemia, Diabetes, Atrial Fibrillation and Chronic Kidney Disease  Care Plan : General Pharmacy (Adult)  Updates made by Germaine Pomfret, RPH since 05/08/2020 12:00 AM    Problem: Hypertension, Hyperlipidemia, Diabetes, Atrial Fibrillation and Chronic Kidney Disease   Priority: High    Long-Range Goal: Patient-Specific Goal   Start Date: 05/06/2020  Expected  End Date: 11/06/2020  This Visit's Progress: On track  Priority: High  Note:   Current Barriers:  . No barriers noted   Pharmacist Clinical Goal(s):  Marland Kitchen Patient will maintain control of diabetes as evidenced by A1c less than 8%  through collaboration with PharmD and provider.   Interventions: . 1:1 collaboration with [name], MD regarding development and update of comprehensive plan of care as evidenced by provider attestation and co-signature . Inter-disciplinary care team collaboration (see longitudinal  plan of care) . Comprehensive medication review performed; medication list updated in electronic medical record  Hypertension (BP goal <140/90) -Controlled -Current treatment: . Amlodipine 10 mg daily  . Losartan 100 mg daily  . Metoprolol Tartrate 100 mg twice daily  -Medications previously tried: 135/60s   -Current home readings: NA -Denies hypotensive/hypertensive symptoms -Educated on Daily salt intake goal < 2300 mg; Exercise goal of 150 minutes per week; Importance of home blood pressure monitoring; -Counseled to monitor BP at home weekly, document, and provide log at future appointments -Recommended to continue current medication  Hyperlipidemia: (LDL goal < 100) -Controlled -Current treatment: . Atorvastatin 10 mg daily  -Medications previously tried: NA  -Educated on Importance of limiting foods high in cholesterol; -Recommended to continue current medication  Diabetes (A1c goal <8%) -Controlled -Current medications: Marland Kitchen Glipizide XL 10 mg daily  -Medications previously tried: NA  -Current home glucose readings . fasting glucose: NA . post prandial glucose: NA -Denies hypoglycemic/hyperglycemic symptoms -Educated on A1c and blood sugar goals; Complications of diabetes including kidney damage, retinal damage, and cardiovascular disease; Prevention and management of hypoglycemic episodes; -Counseled to check feet daily and get yearly eye  exams -Recommended to continue current medication  Atrial Fibrillation (Goal: prevent stroke and major bleeding) -Controlled -CHADSVASC: 6 -Current treatment: . Rate control: Metoprolol Tartrate 100 mg twice daily  . Anticoagulation: Eliquis 5 mg twice daily  -Medications previously tried: NA -Counseled on bleeding risk associated with Eliquis and importance of self-monitoring for signs/symptoms of bleeding; avoidance of NSAIDs due to increased bleeding risk with anticoagulants; -Recommended to continue current medication   Patient Goals/Self-Care Activities . Patient will:  - check glucose daily before breakfast, document, and provide at future appointments check blood pressure weekly, document, and provide at future appointments  Follow Up Plan: Telephone follow up appointment with care management team member scheduled for:  11/03/2020 at 11:00 AM      Medication Assistance: None required.  Patient affirms current coverage meets needs.  Patient's preferred pharmacy is:  [facility], [place] - [address] [place] [address] Phone: [phone] Fax: [phone]  [id number] - [place], [place] AT [address] [place] [address] Phone: [phone] Fax: [facility] #[id number] - [place], [place] - [address] AT [place] [address] [place] [address] Phone: [phone] Fax: [phone]  Uses pill box? Yes Pt endorses 100% compliance  We discussed: Current pharmacy is preferred with insurance plan and patient is satisfied with pharmacy services Patient decided to: Continue current medication management strategy  Care Plan and Follow Up Patient Decision:  Patient agrees to Care Plan and Follow-up.  Plan: Telephone follow up appointment with care management team member scheduled for:  11/03/2020 at 11:00  AM  Doristine Section, [facility] [phone]

## 2020-05-07 ENCOUNTER — Other Ambulatory Visit: Payer: Self-pay

## 2020-05-07 DIAGNOSIS — I70213 Atherosclerosis of native arteries of extremities with intermittent claudication, bilateral legs: Secondary | ICD-10-CM

## 2020-05-08 NOTE — Patient Instructions (Signed)
Visit Information It was great speaking with you today!  Please let me know if you have any questions about our visit.  Goals Addressed            This Visit's Progress   . Monitor and Manage My Blood Sugar-Diabetes Type 2       Timeframe:  Long-Range Goal Priority:  High Start Date:  05/06/2020                           Expected End Date:  11/06/2020                     Follow Up Date 5/30/202   - check blood sugar at prescribed times - check blood sugar if I feel it is too high or too low - enter blood sugar readings and medication or insulin into daily log    Why is this important?    Checking your blood sugar at home helps to keep it from getting very high or very low.   Writing the results in a diary or log helps the doctor know how to care for you.   Your blood sugar log should have the time, date and the results.   Also, write down the amount of insulin or other medicine that you take.   Other information, like what you ate, exercise done and how you were feeling, will also be helpful.     Notes:        Patient Care Plan: General Pharmacy (Adult)    Problem Identified: Hypertension, Hyperlipidemia, Diabetes, Atrial Fibrillation and Chronic Kidney Disease   Priority: High    Long-Range Goal: Patient-Specific Goal   Start Date: 05/06/2020  Expected End Date: 11/06/2020  This Visit's Progress: On track  Priority: High  Note:   Current Barriers:  . No barriers noted   Pharmacist Clinical Goal(s):  Marland Kitchen Patient will maintain control of diabetes as evidenced by A1c less than 8%  through collaboration with PharmD and provider.   Interventions: . 1:1 collaboration with [name], MD regarding development and update of comprehensive plan of care as evidenced by provider attestation and co-signature . Inter-disciplinary care team collaboration (see longitudinal plan of care) . Comprehensive medication review performed; medication list updated in  electronic medical record  Hypertension (BP goal <140/90) -Controlled -Current treatment: . Amlodipine 10 mg daily  . Losartan 100 mg daily  . Metoprolol Tartrate 100 mg twice daily  -Medications previously tried: 135/60s   -Current home readings: NA -Denies hypotensive/hypertensive symptoms -Educated on Daily salt intake goal < 2300 mg; Exercise goal of 150 minutes per week; Importance of home blood pressure monitoring; -Counseled to monitor BP at home weekly, document, and provide log at future appointments -Recommended to continue current medication  Hyperlipidemia: (LDL goal < 100) -Controlled -Current treatment: . Atorvastatin 10 mg daily  -Medications previously tried: NA  -Educated on Importance of limiting foods high in cholesterol; -Recommended to continue current medication  Diabetes (A1c goal <8%) -Controlled -Current medications: Marland Kitchen Glipizide XL 10 mg daily  -Medications previously tried: NA  -Current home glucose readings . fasting glucose: NA . post prandial glucose: NA -Denies hypoglycemic/hyperglycemic symptoms -Educated on A1c and blood sugar goals; Complications of diabetes including kidney damage, retinal damage, and cardiovascular disease; Prevention and management of hypoglycemic episodes; -Counseled to check feet daily and get yearly eye exams -Recommended to continue current medication  Atrial Fibrillation (Goal: prevent stroke and major  bleeding) -Controlled -CHADSVASC: 6 -Current treatment: . Rate control: Metoprolol Tartrate 100 mg twice daily  . Anticoagulation: Eliquis 5 mg twice daily  -Medications previously tried: NA -Counseled on bleeding risk associated with Eliquis and importance of self-monitoring for signs/symptoms of bleeding; avoidance of NSAIDs due to increased bleeding risk with anticoagulants; -Recommended to continue current medication   Patient Goals/Self-Care Activities . Patient will:  - check glucose daily before  breakfast, document, and provide at future appointments check blood pressure weekly, document, and provide at future appointments  Follow Up Plan: Telephone follow up appointment with care management team member scheduled for:  11/03/2020 at 11:00 AM       Ms. Raine was given information about Chronic Care Management services today including:  1. CCM service includes personalized support from designated clinical staff supervised by her physician, including individualized plan of care and coordination with other care providers 2. 24/7 contact phone numbers for assistance for urgent and routine care needs. 3. Standard insurance, coinsurance, copays and deductibles apply for chronic care management only during months in which we provide at least 20 minutes of these services. Most insurances cover these services at 100%, however patients may be responsible for any copay, coinsurance and/or deductible if applicable. This service may help you avoid the need for more expensive face-to-face services. 4. Only one practitioner may furnish and bill the service in a calendar month. 5. The patient may stop CCM services at any time (effective at the end of the month) by phone call to the office staff.  Patient agreed to services and verbal consent obtained.   The patient verbalized understanding of instructions, educational materials, and care plan provided today and declined offer to receive copy of patient instructions, educational materials, and care plan.   [name], PharmD, CPP Clinical Pharmacist [facility]  [phone]

## 2020-05-15 ENCOUNTER — Other Ambulatory Visit: Payer: Self-pay | Admitting: Family Medicine

## 2020-05-15 DIAGNOSIS — I482 Chronic atrial fibrillation, unspecified: Secondary | ICD-10-CM

## 2020-05-15 DIAGNOSIS — I10 Essential (primary) hypertension: Secondary | ICD-10-CM

## 2020-06-02 ENCOUNTER — Other Ambulatory Visit: Payer: Self-pay | Admitting: Family Medicine

## 2020-06-02 DIAGNOSIS — E7800 Pure hypercholesterolemia, unspecified: Secondary | ICD-10-CM

## 2020-06-06 ENCOUNTER — Other Ambulatory Visit: Payer: Self-pay

## 2020-06-06 ENCOUNTER — Ambulatory Visit (INDEPENDENT_AMBULATORY_CARE_PROVIDER_SITE_OTHER): Payer: Medicare Other | Admitting: Family Medicine

## 2020-06-06 ENCOUNTER — Encounter: Payer: Self-pay | Admitting: Family Medicine

## 2020-06-06 VITALS — BP 144/68 | HR 62 | Temp 96.9°F | Ht 62.0 in | Wt 160.6 lb

## 2020-06-06 DIAGNOSIS — E1159 Type 2 diabetes mellitus with other circulatory complications: Secondary | ICD-10-CM

## 2020-06-06 DIAGNOSIS — E785 Hyperlipidemia, unspecified: Secondary | ICD-10-CM

## 2020-06-06 DIAGNOSIS — I152 Hypertension secondary to endocrine disorders: Secondary | ICD-10-CM

## 2020-06-06 DIAGNOSIS — M25562 Pain in left knee: Secondary | ICD-10-CM

## 2020-06-06 DIAGNOSIS — G8929 Other chronic pain: Secondary | ICD-10-CM | POA: Diagnosis not present

## 2020-06-06 DIAGNOSIS — E119 Type 2 diabetes mellitus without complications: Secondary | ICD-10-CM

## 2020-06-06 DIAGNOSIS — E1169 Type 2 diabetes mellitus with other specified complication: Secondary | ICD-10-CM

## 2020-06-06 DIAGNOSIS — M25561 Pain in right knee: Secondary | ICD-10-CM | POA: Diagnosis not present

## 2020-06-06 LAB — COMPREHENSIVE METABOLIC PANEL
ALT: 7 U/L (ref 0–35)
AST: 14 U/L (ref 0–37)
Albumin: 4.1 g/dL (ref 3.5–5.2)
Alkaline Phosphatase: 54 U/L (ref 39–117)
BUN: 22 mg/dL (ref 6–23)
CO2: 26 mEq/L (ref 19–32)
Calcium: 10.5 mg/dL (ref 8.4–10.5)
Chloride: 107 mEq/L (ref 96–112)
Creatinine, Ser: 0.92 mg/dL (ref 0.40–1.20)
GFR: 54.6 mL/min — ABNORMAL LOW (ref >60.00)
Glucose, Bld: 105 mg/dL — ABNORMAL HIGH (ref 70–99)
Potassium: 4.1 mEq/L (ref 3.5–5.1)
Sodium: 142 mEq/L (ref 135–145)
Total Bilirubin: 0.6 mg/dL (ref 0.2–1.2)
Total Protein: 6.7 g/dL (ref 6.0–8.3)

## 2020-06-06 LAB — CBC
HCT: 36.2 % (ref 36.0–46.0)
Hemoglobin: 11.5 g/dL — ABNORMAL LOW (ref 12.0–15.0)
MCHC: 31.6 g/dL (ref 30.0–36.0)
MCV: 83.6 fl (ref 78.0–100.0)
Platelets: 94 10*3/uL — ABNORMAL LOW (ref 150.0–400.0)
RBC: 4.33 Mil/uL (ref 3.87–5.11)
RDW: 15.6 % — ABNORMAL HIGH (ref 11.5–15.5)
WBC: 4.7 10*3/uL (ref 4.0–10.5)

## 2020-06-06 LAB — LIPID PANEL
Cholesterol: 134 mg/dL (ref 0–200)
HDL: 33.1 mg/dL — ABNORMAL LOW (ref >39.00)
LDL Cholesterol: 73 mg/dL (ref 0–99)
NonHDL: 100.49
Total CHOL/HDL Ratio: 4
Triglycerides: 135 mg/dL (ref 0.0–149.0)
VLDL: 27 mg/dL (ref 0.0–40.0)

## 2020-06-06 LAB — MICROALBUMIN / CREATININE URINE RATIO
Creatinine,U: 79.6 mg/dL
Microalb Creat Ratio: 50.3 mg/g — ABNORMAL HIGH (ref 0.0–30.0)
Microalb, Ur: 40.1 mg/dL — ABNORMAL HIGH (ref 0.0–1.9)

## 2020-06-06 LAB — URINALYSIS, ROUTINE W REFLEX MICROSCOPIC
Bilirubin Urine: NEGATIVE
Ketones, ur: NEGATIVE
Leukocytes,Ua: NEGATIVE
Nitrite: NEGATIVE
Specific Gravity, Urine: 1.02 (ref 1.000–1.030)
Total Protein, Urine: 30 — AB
Urine Glucose: NEGATIVE
Urobilinogen, UA: 0.2 (ref 0.0–1.0)
pH: 5 (ref 5.0–8.0)

## 2020-06-06 LAB — HEMOGLOBIN A1C: Hgb A1c MFr Bld: 5.7 % (ref 4.6–6.5)

## 2020-06-06 MED ORDER — TRAMADOL HCL 50 MG PO TABS: 50.0000 mg | ORAL_TABLET | Freq: Two times a day (BID) | ORAL | 1 refills | Status: AC | PRN

## 2020-06-06 NOTE — Progress Notes (Signed)
Established Patient Office Visit  Subjective:  Patient ID: Chelsea Dean, female    DOB: 01-Mar-1927  Age: 87 y.o. MRN: [id number]  CC:  Chief Complaint  Patient presents with  . Follow-up    Routine follow up, patient complains of knee pain.     HPI Chelsea Dean presents for follow-up of hypertension, diabetes, elevated cholesterol and bilateral left greater then right knee pain.  Ambulation has been difficult for her secondary to the pain.  She has been using the Voltaren gel with some relief.  She had been given some Ultram after a vascular procedure and the seem to help.  Does not feel too woozy when she takes these.   Past Medical History:  Diagnosis Date  . A-fib ([place]) 10/19/2016  . Abdominal aortic aneurysm (AAA) without rupture ([place]) 09/15/2013  . Acute idiopathic gout of right foot   . AKI (acute kidney injury) ([place]) 10/18/2016  . Anticoagulated on Coumadin   . Arthritis   . Diabetes mellitus without complication ([place])   . Diverticular disease of large intestine 04/16/2013  . Diverticulosis   . DM2 (diabetes mellitus, type 2) ([place]) 10/19/2016  . Dysrhythmia    Atrial fib  . History of colon polyps 04/16/2013  . Hx of colonic polyps   . Hyperlipidemia   . Hypertension   . Long term current use of anticoagulant therapy 02/05/2015  . Lumbar radiculopathy 03/28/2014  . Microalbuminuria 04/16/2013  . Osteoarthritis 04/16/2013  . Osteoarthritis of cervical spine 03/28/2014  . Pelvic mass 10/19/2016  . Right renal mass   . SBO (small bowel obstruction) ([place]) 10/18/2016  . Thrombocytopenia ([place]) 04/16/2013   Overview:  referred to hematology 01/2104    Past Surgical History:  Procedure Laterality Date  . ABDOMINAL AORTIC ANEURYSM REPAIR    . ABDOMINAL HYSTERECTOMY    . IR ANGIOGRAM EXTREMITY LEFT  04/01/2020  . IR ANGIOGRAM PELVIS SELECTIVE OR SUPRASELECTIVE  04/01/2020  . IR ANGIOGRAM SELECTIVE EACH ADDITIONAL VESSEL  04/01/2020  . IR ANGIOGRAM SELECTIVE EACH ADDITIONAL VESSEL   04/01/2020  . IR ANGIOGRAM SELECTIVE EACH ADDITIONAL VESSEL  04/01/2020  . IR AORTAGRAM ABDOMINAL SERIALOGRAM  04/01/2020  . IR EMBO ARTERIAL NOT HEMORR HEMANG INC GUIDE ROADMAPPING  04/01/2020  . IR GENERIC HISTORICAL  07/01/2015   IR RADIOLOGIST EVAL & MGMT 07/01/2015 [facility] INTERV RAD  . IR RADIOLOGIST EVAL & MGMT  03/11/2020  . IR RADIOLOGIST EVAL & MGMT  04/30/2020  . IR THROMBECT PRIM MECH INIT (INCLU) MOD SED  04/01/2020  . IR US GUIDE VASC ACCESS LEFT  04/01/2020  . IR US GUIDE VASC ACCESS LEFT  04/01/2020  . IR US GUIDE VASC ACCESS RIGHT  04/01/2020  . THROMBECTOMY FEMORAL ARTERY Left 04/01/2020   Procedure: THROMBECTOMY LEFT POPLITEAL ARTERY;  Surgeon: Rosetta Posner, MD;  Location: [facility];  Service: Vascular;  Laterality: Left;    Family History  Problem Relation Age of Onset  . Cancer Sister        Rectal and Stomach    Social History   Socioeconomic History  . Marital status: Married    Spouse name: Not on file  . Number of children: Not on file  . Years of education: Not on file  . Highest education level: Not on file  Occupational History  . Occupation: retired [employer]  . Smoking status: Never Smoker  . Smokeless tobacco: Current User    Types: Snuff  Vaping Use  . Vaping Use: Never used  Substance and Sexual Activity  . Alcohol use: No    Alcohol/week: 0.0 standard drinks  . Drug use: No  . Sexual activity: Not on file  Other Topics Concern  . Not on file  Social History Narrative   Admitted to [place] 10/27/16   Married   Use snuff   Alcohol none   DNR   Social Determinants of Health   Financial Resource Strain: Low Risk   . Difficulty of Paying Living Expenses: Not hard at all  Food Insecurity: Not on file  Transportation Needs: Not on file  Physical Activity: Not on file  Stress: Not on file  Social Connections: Not on file  Intimate Partner Violence: Not on file    Outpatient Medications Prior to Visit   Medication Sig Dispense Refill  . acetaminophen (TYLENOL) 650 MG CR tablet Take 650-1,300 mg by mouth every 8 (eight) hours as needed for pain.    Marland Kitchen amLODipine (NORVASC) 10 MG tablet TAKE 1 TABLET BY MOUTH EVERY DAY FOR BLOOD PRESSURE 90 tablet 1  . atorvastatin (LIPITOR) 10 MG tablet TAKE 1 TABLET BY MOUTH EVERY DAY 90 tablet 3  . diclofenac sodium (VOLTAREN) 1 % GEL Apply 2 g topically daily as needed (KNEE PAIN).    Marland Kitchen ELIQUIS 5 MG TABS tablet TAKE 1 TABLET BY MOUTH TWICE DAILY FOR 30 DAYS (02/13/20 TO INDEFINITELY) 60 tablet 5  . glipiZIDE (GLUCOTROL XL) 10 MG 24 hr tablet TAKE 1 TABLET BY MOUTH EVERY DAY WITH BREAKFAST 90 tablet 2  . Lidocaine (HM LIDOCAINE PATCH) 4 % PTCH Apply 1 patch topically every 12 (twelve) hours. 14 patch 0  . losartan (COZAAR) 100 MG tablet TAKE 1 TABLET BY MOUTH EVERY DAY 90 tablet 1  . metoprolol tartrate (LOPRESSOR) 100 MG tablet TAKE 1 TABLET BY MOUTH 2 TIMES A DAY 180 tablet 1  . diphenhydrAMINE (BENADRYL) 50 MG tablet Take 50 mg by mouth daily as needed (Contrast Dye). (Patient not taking: Reported on 06/06/2020)     No facility-administered medications prior to visit.    Allergies  Allergen Reactions  . Ace Inhibitors Shortness Of Breath and Rash  . Contrast Media [Iodinated Diagnostic Agents] Shortness Of Breath and Rash  . Nsaids     Unknown reaction    ROS Review of Systems  Constitutional: Negative.   HENT: Negative.   Respiratory: Negative.   Cardiovascular: Negative.   Gastrointestinal: Negative.   Endocrine: Negative for polyphagia and polyuria.  Genitourinary: Negative.   Musculoskeletal: Positive for arthralgias and gait problem.  Neurological: Negative for tremors and speech difficulty.  Psychiatric/Behavioral: Negative.       Objective:    Physical Exam Vitals and nursing note reviewed.  Constitutional:      General: She is not in acute distress.    Appearance: Normal appearance. She is not ill-appearing, toxic-appearing or  diaphoretic.  HENT:     Head: Normocephalic and atraumatic.     Right Ear: External ear normal.     Left Ear: External ear normal.  Eyes:     General: No scleral icterus.       Right eye: No discharge.        Left eye: No discharge.     Extraocular Movements: Extraocular movements intact.     Conjunctiva/sclera: Conjunctivae normal.     Pupils: Pupils are equal, round, and reactive to light.  Neck:     Vascular: No carotid bruit.  Cardiovascular:     Rate and Rhythm:  Normal rate and regular rhythm.     Pulses:          Carotid pulses are 1+ on the right side and 1+ on the left side. Pulmonary:     Effort: Pulmonary effort is normal.     Breath sounds: Normal breath sounds.  Abdominal:     General: Bowel sounds are normal.  Musculoskeletal:     Cervical back: No rigidity or tenderness.  Lymphadenopathy:     Cervical: No cervical adenopathy.  Skin:    General: Skin is warm and dry.  Neurological:     Mental Status: She is alert and oriented to person, place, and time.  Psychiatric:        Mood and Affect: Mood normal.        Behavior: Behavior normal.     Diabetic Foot Exam - Simple   Simple Foot Form Diabetic Foot exam was performed with the following findings: Yes 06/06/2020 10:03 AM  Visual Inspection No deformities, no ulcerations, no other skin breakdown bilaterally: Yes Sensation Testing Intact to touch and monofilament testing bilaterally: Yes Pulse Check See comments: Yes Comments  Feet pes planus bilaterally.  There are no lesions or rashes.  Pulses are diminished bilaterally.  Capillary refill 3 to 5 seconds.    BP (!) 144/68   Pulse 62   Temp (!) 96.9 F (36.1 C) (Temporal)   Ht 5\' 2"  (1.575 m)   Wt 160 lb 9.6 oz (72.8 kg)   SpO2 97%   BMI 29.37 kg/m  Wt Readings from Last 3 Encounters:  06/06/20 160 lb 9.6 oz (72.8 kg)  05/05/20 164 lb (74.4 kg)  04/01/20 164 lb 14.5 oz (74.8 kg)     Health Maintenance Due  Topic Date Due  . TETANUS/TDAP   Never done  . DEXA SCAN  Never done  . COVID-19 Vaccine (3 - Booster for Moderna series) 11/15/2019  . HEMOGLOBIN A1C  03/23/2020    There are no preventive care reminders to display for this patient.  Lab Results  Component Value Date   TSH 3.26 03/02/2017   Lab Results  Component Value Date   WBC 9.0 04/02/2020   HGB 9.8 (L) 04/02/2020   HCT 31.7 (L) 04/02/2020   MCV 86.1 04/02/2020   PLT 93 (L) 04/02/2020   Lab Results  Component Value Date   NA 143 04/02/2020   K 3.5 04/02/2020   CO2 24 04/02/2020   GLUCOSE 160 (H) 04/02/2020   BUN 18 04/02/2020   CREATININE 1.07 (H) 04/02/2020   BILITOT 0.6 09/21/2019   ALKPHOS 49 09/21/2019   AST 18 09/21/2019   ALT 12 09/21/2019   PROT 7.4 09/21/2019   ALBUMIN 4.4 09/21/2019   CALCIUM 9.4 04/02/2020   ANIONGAP 9 04/02/2020   GFR 51.45 (L) 09/21/2019   Lab Results  Component Value Date   CHOL 130 04/02/2020   Lab Results  Component Value Date   HDL 31 (L) 04/02/2020   Lab Results  Component Value Date   LDLCALC 84 04/02/2020   Lab Results  Component Value Date   TRIG 74 04/02/2020   Lab Results  Component Value Date   CHOLHDL 4.2 04/02/2020   Lab Results  Component Value Date   HGBA1C 6.5 09/21/2019      Assessment & Plan:   Problem List Items Addressed This Visit      Cardiovascular and Mediastinum   Hypertension associated with type 2 diabetes mellitus ([place])   Relevant Orders  CBC   Comprehensive metabolic panel   Urinalysis, Routine w reflex microscopic   Microalbumin / creatinine urine ratio     Endocrine   Type 2 diabetes mellitus without complication, without long-term current use of insulin ([facility]) - Primary   Relevant Orders   CBC   Comprehensive metabolic panel   Urinalysis, Routine w reflex microscopic   Microalbumin / creatinine urine ratio   Hemoglobin A1c   Hyperlipidemia associated with type 2 diabetes mellitus ([facility])   Relevant Orders   Lipid panel     Other   Chronic pain of  both knees   Relevant Medications   traMADol (ULTRAM) 50 MG tablet      Meds ordered this encounter  Medications  . traMADol (ULTRAM) 50 MG tablet    Sig: Take 1 tablet (50 mg total) by mouth every 12 (twelve) hours as needed.    Dispense:  60 tablet    Refill:  1    Follow-up: Return in about 3 months (around 09/05/2020).   Continue all medicines as above have added as needed Ultram to use.  Encourage patient to use her walker as often as possible. [name], MD

## 2020-06-09 ENCOUNTER — Telehealth: Payer: Self-pay | Admitting: Family Medicine

## 2020-06-09 NOTE — Telephone Encounter (Signed)
Pt's daughter(Daisy) is wanting Dr. Ethelene Hal to fill out a PA for pt to be able to get PT for her knees and legs. I asked if [employer] was going to send a form over to be filled out. She stated [employer] told her- he would be able to do this. When completed, send to [facility]. If any further questions. Please call pt's daughter at [phone].

## 2020-07-07 ENCOUNTER — Other Ambulatory Visit: Payer: Self-pay | Admitting: Family Medicine

## 2020-07-07 DIAGNOSIS — E119 Type 2 diabetes mellitus without complications: Secondary | ICD-10-CM

## 2020-07-07 DIAGNOSIS — I10 Essential (primary) hypertension: Secondary | ICD-10-CM

## 2020-07-07 DIAGNOSIS — N183 Chronic kidney disease, stage 3 unspecified: Secondary | ICD-10-CM

## 2020-07-09 ENCOUNTER — Other Ambulatory Visit: Payer: Self-pay

## 2020-07-09 DIAGNOSIS — G8929 Other chronic pain: Secondary | ICD-10-CM

## 2020-07-09 NOTE — Telephone Encounter (Signed)
Referral for PT placed

## 2020-07-10 ENCOUNTER — Telehealth: Payer: Self-pay

## 2020-07-10 NOTE — Progress Notes (Signed)
Chronic Care Management Pharmacy Assistant   Name: Chelsea Dean  MRN: [id number] DOB: 17-May-1927  Reason for Encounter:Hypertension and Diabetes Disease State Call.   Recent office visits:  06/06/2020 Dr.Kremer MD (PCP) Started Tramadol 50 mg PRN  Recent consult visits:  No recent [facility] visits:  None in previous 6 months  Medications: Outpatient Encounter Medications as of 07/10/2020  Medication Sig  . acetaminophen (TYLENOL) 650 MG CR tablet Take 650-1,300 mg by mouth every 8 (eight) hours as needed for pain.  Marland Kitchen amLODipine (NORVASC) 10 MG tablet TAKE 1 TABLET BY MOUTH EVERY DAY FOR BLOOD PRESSURE  . atorvastatin (LIPITOR) 10 MG tablet TAKE 1 TABLET BY MOUTH EVERY DAY  . diclofenac sodium (VOLTAREN) 1 % GEL Apply 2 g topically daily as needed (KNEE PAIN).  Marland Kitchen diphenhydrAMINE (BENADRYL) 50 MG tablet Take 50 mg by mouth daily as needed (Contrast Dye). (Patient not taking: Reported on 06/06/2020)  . ELIQUIS 5 MG TABS tablet TAKE 1 TABLET BY MOUTH TWICE DAILY FOR 30 DAYS (02/13/20 TO INDEFINITELY)  . glipiZIDE (GLUCOTROL XL) 10 MG 24 hr tablet TAKE 1 TABLET BY MOUTH EVERY DAY WITH BREAKFAST  . Lidocaine (HM LIDOCAINE PATCH) 4 % PTCH Apply 1 patch topically every 12 (twelve) hours.  Marland Kitchen losartan (COZAAR) 100 MG tablet TAKE 1 TABLET BY MOUTH EVERY DAY  . metoprolol tartrate (LOPRESSOR) 100 MG tablet TAKE 1 TABLET BY MOUTH 2 TIMES A DAY   No facility-administered encounter medications on file as of 07/10/2020.   Star Rating Drugs: Atorvastatin 10 mg last filled on 06/02/2020 for 90 day supply at [facility]. Glipizide 10 mg last filled on 06/02/2020 for 90 day supply at [facility] Losartan 100 mg last filled on 07/07/2020 for 90 day supply at [facility].  Recent Relevant Labs: Lab Results  Component Value Date/Time   HGBA1C 5.7 06/06/2020 10:21 AM   HGBA1C 6.5 09/21/2019 10:40 AM   MICROALBUR 40.1 (H) 06/06/2020 10:21 AM   MICROALBUR 83.5 (H)  09/21/2019 10:51 AM    Kidney Function Lab Results  Component Value Date/Time   CREATININE 0.92 06/06/2020 10:21 AM   CREATININE 1.07 (H) 04/02/2020 05:48 AM   GFR 54.60 (L) 06/06/2020 10:21 AM   GFRNONAA 49 (L) 04/02/2020 05:48 AM   GFRAA 51 (L) 04/01/2018 06:54 PM    . Current antihyperglycemic regimen:   Glipizide XL 10 mg daily   Patient daughter denies any issue or sides from glipizide.  . What recent interventions/DTPs have been made to improve glycemic control:  o None ID . Have there been any recent hospitalizations or ED visits since last visit with CPP? No . Patient daughter denies hypoglycemic symptoms, including Pale, Sweaty, Shaky, Hungry, Nervous/irritable and Vision changes . Patient daughter denies hyperglycemic symptoms, including blurry vision, excessive thirst, fatigue, polyuria and weakness . How often are you checking your blood sugar? once daily . What are your blood sugars ranging?  o After meals:  - Patient daughter states patient blood sugar ranges around 130.  Marland Kitchen During the week, how often does your blood glucose drop below 70? Never . Are you checking your feet daily/regularly?  o Patient daughter denies numbness,pain or tingling sensation in her feet. o  Adherence Review: Is the patient currently on a STATIN medication? Yes Is the patient currently on ACE/ARB medication? Yes Does the patient have >5 day gap between last estimated fill dates? No   Reviewed chart prior to disease state call. Spoke with patient regarding BP  Recent Office Vitals: BP Readings from Last 3 Encounters:  06/06/20 (!) 144/68  05/05/20 118/71  04/02/20 (!) 123/96   Pulse Readings from Last 3 Encounters:  06/06/20 62  05/05/20 72  04/02/20 72    Wt Readings from Last 3 Encounters:  06/06/20 160 lb 9.6 oz (72.8 kg)  05/05/20 164 lb (74.4 kg)  04/01/20 164 lb 14.5 oz (74.8 kg)     Kidney Function Lab Results  Component Value Date/Time   CREATININE 0.92  06/06/2020 10:21 AM   CREATININE 1.07 (H) 04/02/2020 05:48 AM   GFR 54.60 (L) 06/06/2020 10:21 AM   GFRNONAA 49 (L) 04/02/2020 05:48 AM   GFRAA 51 (L) 04/01/2018 06:54 PM    BMP Latest Ref Rng & Units 06/06/2020 04/02/2020 04/01/2020  Glucose 70 - 99 mg/dL 105(H) 160(H) 193(H)  BUN 6 - 23 mg/dL 22 18 23   Creatinine 0.40 - 1.20 mg/dL 0.92 1.07(H) 1.06(H)  Sodium 135 - 145 mEq/L 142 143 143  Potassium 3.5 - 5.1 mEq/L 4.1 3.5 4.1  Chloride 96 - 112 mEq/L 107 110 108  CO2 19 - 32 mEq/L 26 24 23   Calcium 8.4 - 10.5 mg/dL 10.5 9.4 10.6(H)    . Current antihypertensive regimen:   Amlodipine 10 mg daily   Losartan 100 mg daily   Metoprolol Tartrate 100 mg twice daily   Patient daughter states patient denies any issue or side effect from amlodipine, losartan, or metoprolol. Patient daughter denies headaches, dizziness or lightheaded.  . How often are you checking your Blood Pressure? 1-2x per week . Current home BP readings:   o Patient daughter states patient systolic ranges around  381-771, patient daughter is unsure of the diastolic.  o I ask patient daughter if she could start keeping a blood sugar and blood pressure log that would include , time , date , blood pressure reading and blood sugar reading (and if the patient has ate for the blood sugar). o Patient daughter agreed with keep blood pressure and blood sugar log. . What recent interventions/DTPs have been made by any provider to improve Blood Pressure control since last CPP Visit: None ID . Any recent hospitalizations or ED visits since last visit with- CPP? No . What diet changes have been made to improve Blood Pressure Control?  o Patient daughter states patient enjoys Vegetables, and  apple sauce. . What exercise is being done to improve your Blood Pressure Control?  o Patient daughter states patient tries to walk with her walker.Patient daughter reports they request physical therapy, but has not heard anything  yet.  Adherence Review: Is the patient currently on ACE/ARB medication? Yes Does the patient have >5 day gap between last estimated fill dates? No  Anderson [place] Clinical Production designer, theatre/television/film [phone]

## 2020-07-19 ENCOUNTER — Other Ambulatory Visit: Payer: Self-pay | Admitting: Family Medicine

## 2020-07-19 DIAGNOSIS — I10 Essential (primary) hypertension: Secondary | ICD-10-CM

## 2020-08-26 ENCOUNTER — Ambulatory Visit (INDEPENDENT_AMBULATORY_CARE_PROVIDER_SITE_OTHER): Payer: Medicare Other | Admitting: *Deleted

## 2020-08-26 DIAGNOSIS — Z0000 Encounter for general adult medical examination without abnormal findings: Secondary | ICD-10-CM | POA: Diagnosis not present

## 2020-08-26 NOTE — Patient Instructions (Addendum)
Ms. Chelsea Dean , Thank you for taking time to come for your Medicare Wellness Visit. I appreciate your ongoing commitment to your health goals. Please review the following plan we discussed and let me know if I can assist you in the future.   Screening recommendations/referrals: Colonoscopy: no longer required Mammogram: no longer required Bone Density: educaiton provided Recommended yearly ophthalmology/optometry visit for glaucoma screening and checkup Recommended yearly dental visit for hygiene and checkup  Vaccinations: Influenza vaccine: up to date Pneumococcal vaccine: up to date Tdap vaccine: education provided Shingles vaccine: education provided    Advanced directives: copy requested  Conditions/risks identified: na  Next appointment: 09-10-2020  Dr Ethelene Hal   Preventive Care 85 Years and Older, Female Preventive care refers to lifestyle choices and visits with your health care provider that can promote health and wellness. What does preventive care include? A yearly physical exam. This is also called an annual well check. Dental exams once or twice a year. Routine eye exams. Ask your health care provider how often you should have your eyes checked. Personal lifestyle choices, including: Daily care of your teeth and gums. Regular physical activity. Eating a healthy diet. Avoiding tobacco and drug use. Limiting alcohol use. Practicing safe sex. Taking low-dose aspirin every day. Taking vitamin and mineral supplements as recommended by your health care provider. What happens during an annual well check? The services and screenings done by your health care provider during your annual well check will depend on your age, overall health, lifestyle risk factors, and family history of disease. Counseling  Your health care provider may ask you questions about your: Alcohol use. Tobacco use. Drug use. Emotional well-being. Home and relationship well-being. Sexual  activity. Eating habits. History of falls. Memory and ability to understand (cognition). Work and work Statistician. Reproductive health. Screening  You may have the following tests or measurements: Height, weight, and BMI. Blood pressure. Lipid and cholesterol levels. These may be checked every 5 years, or more frequently if you are over 82 years old. Skin check. Lung cancer screening. You may have this screening every year starting at age 80 if you have a 30-pack-year history of smoking and currently smoke or have quit within the past 15 years. Fecal occult blood test (FOBT) of the stool. You may have this test every year starting at age 73. Flexible sigmoidoscopy or colonoscopy. You may have a sigmoidoscopy every 5 years or a colonoscopy every 10 years starting at age 45. Hepatitis C blood test. Hepatitis B blood test. Sexually transmitted disease (STD) testing. Diabetes screening. This is done by checking your blood sugar (glucose) after you have not eaten for a while (fasting). You may have this done every 1-3 years. Bone density scan. This is done to screen for osteoporosis. You may have this done starting at age 55. Mammogram. This may be done every 1-2 years. Talk to your health care provider about how often you should have regular mammograms. Talk with your health care provider about your test results, treatment options, and if necessary, the need for more tests. Vaccines  Your health care provider may recommend certain vaccines, such as: Influenza vaccine. This is recommended every year. Tetanus, diphtheria, and acellular pertussis (Tdap, Td) vaccine. You may need a Td booster every 10 years. Zoster vaccine. You may need this after age 53. Pneumococcal 13-valent conjugate (PCV13) vaccine. One dose is recommended after age 64. Pneumococcal polysaccharide (PPSV23) vaccine. One dose is recommended after age 19. Talk to your health care provider about which  screenings and vaccines  you need and how often you need them. This information is not intended to replace advice given to you by your health care provider. Make sure you discuss any questions you have with your health care provider. Document Released: 02/28/2015 Document Revised: 10/22/2015 Document Reviewed: 12/03/2014 Elsevier Interactive Patient Education  2017 [place] Prevention in the Home Falls can cause injuries. They can happen to people of all ages. There are many things you can do to make your home safe and to help prevent falls. What can I do on the outside of my home? Regularly fix the edges of walkways and driveways and fix any cracks. Remove anything that might make you trip as you walk through a door, such as a raised step or threshold. Trim any bushes or trees on the path to your home. Use bright outdoor lighting. Clear any walking paths of anything that might make someone trip, such as rocks or tools. Regularly check to see if handrails are loose or broken. Make sure that both sides of any steps have handrails. Any raised decks and porches should have guardrails on the edges. Have any leaves, snow, or ice cleared regularly. Use sand or salt on walking paths during winter. Clean up any spills in your garage right away. This includes oil or grease spills. What can I do in the bathroom? Use night lights. Install grab bars by the toilet and in the tub and shower. Do not use towel bars as grab bars. Use non-skid mats or decals in the tub or shower. If you need to sit down in the shower, use a plastic, non-slip stool. Keep the floor dry. Clean up any water that spills on the floor as soon as it happens. Remove soap buildup in the tub or shower regularly. Attach bath mats securely with double-sided non-slip rug tape. Do not have throw rugs and other things on the floor that can make you trip. What can I do in the bedroom? Use night lights. Make sure that you have a light by your bed that  is easy to reach. Do not use any sheets or blankets that are too big for your bed. They should not hang down onto the floor. Have a firm chair that has side arms. You can use this for support while you get dressed. Do not have throw rugs and other things on the floor that can make you trip. What can I do in the kitchen? Clean up any spills right away. Avoid walking on wet floors. Keep items that you use a lot in easy-to-reach places. If you need to reach something above you, use a strong step stool that has a grab bar. Keep electrical cords out of the way. Do not use floor polish or wax that makes floors slippery. If you must use wax, use non-skid floor wax. Do not have throw rugs and other things on the floor that can make you trip. What can I do with my stairs? Do not leave any items on the stairs. Make sure that there are handrails on both sides of the stairs and use them. Fix handrails that are broken or loose. Make sure that handrails are as long as the stairways. Check any carpeting to make sure that it is firmly attached to the stairs. Fix any carpet that is loose or worn. Avoid having throw rugs at the top or bottom of the stairs. If you do have throw rugs, attach them to the floor with carpet  tape. Make sure that you have a light switch at the top of the stairs and the bottom of the stairs. If you do not have them, ask someone to add them for you. What else can I do to help prevent falls? Wear shoes that: Do not have high heels. Have rubber bottoms. Are comfortable and fit you well. Are closed at the toe. Do not wear sandals. If you use a stepladder: Make sure that it is fully opened. Do not climb a closed stepladder. Make sure that both sides of the stepladder are locked into place. Ask someone to hold it for you, if possible. Clearly mark and make sure that you can see: Any grab bars or handrails. First and last steps. Where the edge of each step is. Use tools that help you  move around (mobility aids) if they are needed. These include: Canes. Walkers. Scooters. Crutches. Turn on the lights when you go into a dark area. Replace any light bulbs as soon as they burn out. Set up your furniture so you have a clear path. Avoid moving your furniture around. If any of your floors are uneven, fix them. If there are any pets around you, be aware of where they are. Review your medicines with your doctor. Some medicines can make you feel dizzy. This can increase your chance of falling. Ask your doctor what other things that you can do to help prevent falls. This information is not intended to replace advice given to you by your health care provider. Make sure you discuss any questions you have with your health care provider. Document Released: 11/28/2008 Document Revised: 07/10/2015 Document Reviewed: 03/08/2014 Elsevier Interactive Patient Education  2017 [employer].

## 2020-08-26 NOTE — Progress Notes (Signed)
Subjective:   Chelsea Dean is a 87 y.o. female who presents for Medicare Annual (Subsequent) preventive examination.  I connected with  Chelsea Dean on 08/26/20 by a telephone enabled telemedicine application and verified that I am speaking with the correct person using two identifiers.   I discussed the limitations of evaluation and management by telemedicine. The patient expressed understanding and agreed to proceed.  Patient location: home  Provider location:  Tele-Health Visit    Review of Systems      NA Cardiac Risk Factors include: advanced age (>81men, >62 women);diabetes mellitus;hypertension;dyslipidemia     Objective:    Today's Vitals   There is no height or weight on file to calculate BMI.  Advanced Directives 08/26/2020 04/01/2020 02/08/2020 05/09/2019 04/01/2018 03/29/2017 03/20/2017  Does Patient Have a Medical Advance Directive? No;Yes No No No No No No  Type of Academic librarian;Living will - - - - - -  Copy of [place] in Chart? No - copy requested - - - - - -  Would patient like information on creating a medical advance directive? - No - Patient declined Yes (ED - Information included in AVS) No - Patient declined - No - Patient declined -  Pre-existing out of facility DNR order (yellow form or pink MOST form) - - - - - - -    Current Medications (verified) Outpatient Encounter Medications as of 08/26/2020  Medication Sig   acetaminophen (TYLENOL) 650 MG CR tablet Take 650-1,300 mg by mouth every 8 (eight) hours as needed for pain.   amLODipine (NORVASC) 10 MG tablet TAKE 1 TABLET BY MOUTH EVERY DAY FOR BLOOD PRESSURE   atorvastatin (LIPITOR) 10 MG tablet TAKE 1 TABLET BY MOUTH EVERY DAY   diclofenac sodium (VOLTAREN) 1 % GEL Apply 2 g topically daily as needed (KNEE PAIN).   ELIQUIS 5 MG TABS tablet TAKE 1 TABLET BY MOUTH TWICE DAILY FOR 30 DAYS (02/13/20 TO INDEFINITELY)   glipiZIDE (GLUCOTROL XL) 10 MG 24  hr tablet TAKE 1 TABLET BY MOUTH EVERY DAY WITH BREAKFAST   Lidocaine (HM LIDOCAINE PATCH) 4 % PTCH Apply 1 patch topically every 12 (twelve) hours.   losartan (COZAAR) 100 MG tablet TAKE 1 TABLET BY MOUTH EVERY DAY   metoprolol tartrate (LOPRESSOR) 100 MG tablet TAKE 1 TABLET BY MOUTH 2 TIMES A DAY   diphenhydrAMINE (BENADRYL) 50 MG tablet Take 50 mg by mouth daily as needed (Contrast Dye). (Patient not taking: No sig reported)   No facility-administered encounter medications on file as of 08/26/2020.    Allergies (verified) Ace inhibitors, Contrast media [iodinated diagnostic agents], and Nsaids   History: Past Medical History:  Diagnosis Date   A-fib ([place]) 10/19/2016   Abdominal aortic aneurysm (AAA) without rupture ([place]) 09/15/2013   Acute idiopathic gout of right foot    AKI (acute kidney injury) ([place]) 10/18/2016   Anticoagulated on Coumadin    Arthritis    Diabetes mellitus without complication ([place])    Diverticular disease of large intestine 04/16/2013   Diverticulosis    DM2 (diabetes mellitus, type 2) ([place]) 10/19/2016   Dysrhythmia    Atrial fib   History of colon polyps 04/16/2013   Hx of colonic polyps    Hyperlipidemia    Hypertension    Long term current use of anticoagulant therapy 02/05/2015   Lumbar radiculopathy 03/28/2014   Microalbuminuria 04/16/2013   Osteoarthritis 04/16/2013   Osteoarthritis of cervical spine 03/28/2014   Pelvic mass 10/19/2016  Right renal mass    SBO (small bowel obstruction) ([place]) 10/18/2016   Thrombocytopenia ([place]) 04/16/2013   Overview:  referred to hematology 01/2104   Past Surgical History:  Procedure Laterality Date   ABDOMINAL AORTIC ANEURYSM REPAIR     ABDOMINAL HYSTERECTOMY     IR ANGIOGRAM EXTREMITY LEFT  04/01/2020   IR ANGIOGRAM PELVIS SELECTIVE OR SUPRASELECTIVE  04/01/2020   IR ANGIOGRAM SELECTIVE EACH ADDITIONAL VESSEL  04/01/2020   IR ANGIOGRAM SELECTIVE EACH ADDITIONAL VESSEL  04/01/2020   IR ANGIOGRAM SELECTIVE EACH ADDITIONAL VESSEL   04/01/2020   IR AORTAGRAM ABDOMINAL SERIALOGRAM  04/01/2020   IR EMBO ARTERIAL NOT HEMORR HEMANG INC GUIDE ROADMAPPING  04/01/2020   IR GENERIC HISTORICAL  07/01/2015   IR RADIOLOGIST EVAL & MGMT 07/01/2015 [facility] INTERV RAD   IR RADIOLOGIST EVAL & MGMT  03/11/2020   IR RADIOLOGIST EVAL & MGMT  04/30/2020   IR THROMBECT PRIM MECH INIT (INCLU) MOD SED  04/01/2020   IR US GUIDE VASC ACCESS LEFT  04/01/2020   IR US GUIDE VASC ACCESS LEFT  04/01/2020   IR US GUIDE VASC ACCESS RIGHT  04/01/2020   THROMBECTOMY FEMORAL ARTERY Left 04/01/2020   Procedure: THROMBECTOMY LEFT POPLITEAL ARTERY;  Surgeon: Rosetta Posner, MD;  Location: [facility] OR;  Service: Vascular;  Laterality: Left;   Family History  Problem Relation Age of Onset   Cancer Sister        Rectal and Stomach   Social History   Socioeconomic History   Marital status: Married    Spouse name: Not on file   Number of children: Not on file   Years of education: Not on file   Highest education level: Not on file  Occupational History   Occupation: retired [employer]  Tobacco Use   Smoking status: Never   Smokeless tobacco: Current    Types: Snuff  Vaping Use   Vaping Use: Never used  Substance and Sexual Activity   Alcohol use: No    Alcohol/week: 0.0 standard drinks   Drug use: No   Sexual activity: Not on file  Other Topics Concern   Not on file  Social History Narrative   Admitted to [facility] 10/27/16   Married   Use snuff   Alcohol none   DNR   Social Determinants of Health   Financial Resource Strain: Low Risk    Difficulty of Paying Living Expenses: Not hard at all  Food Insecurity: No Food Insecurity   Worried About Charity fundraiser in the Last Year: Never true   Arboriculturist in the Last Year: Never true  Transportation Needs: No Transportation Needs   Lack of Transportation (Medical): No   Lack of Transportation (Non-Medical): No  Physical Activity: Insufficiently Active   Days of Exercise per  Week: 3 days   Minutes of Exercise per Session: 10 min  Stress: No Stress Concern Present   Feeling of Stress : Not at all  Social Connections: Socially Isolated   Frequency of Communication with Friends and Family: Three times a week   Frequency of Social Gatherings with Friends and Family: Once a week   Attends Religious Services: Never   Marine scientist or Organizations: No   Attends Archivist Meetings: Never   Marital Status: Widowed    Tobacco Counseling Ready to quit: Not Answered Counseling given: Not Answered   Clinical Intake:  Pre-visit preparation completed: Yes  Pain : 0-10 Pain  Location: Knee Pain Orientation: Right, Left Pain Descriptors / Indicators: Aching, Constant Pain Onset: More than a month ago Pain Frequency: Constant Pain Relieving Factors: TYLENOL, TRAMADOL  Pain Relieving Factors: TYLENOL, TRAMADOL  Nutritional Risks: None Diabetes: Yes CBG done?: No Did pt. bring in CBG monitor from home?: No  How often do you need to have someone help you when you read instructions, pamphlets, or other written materials from your doctor or pharmacy?: 1 - Never  Diabetic?   YES  Nutrition Risk Assessment:  Has the patient had any N/V/D within the last 2 months?  No  Does the patient have any non-healing wounds?  No  Has the patient had any unintentional weight loss or weight gain?  No   Diabetes:  Is the patient diabetic?  Yes  If diabetic, was a CBG obtained today?  No  Did the patient bring in their glucometer from home?  No  How often do you monitor your CBG's? 2 times a week.   Financial Strains and Diabetes Management:  Are you having any financial strains with the device, your supplies or2 x a week your medication? No .  Does the patient want to be seen by Chronic Care Management for management of their diabetes?  No  Would the patient like to be referred to a Nutritionist or for Diabetic Management?  No   Diabetic  Exams:  Diabetic Eye Exam: Completed . Overdue for diabetic eye exam. Pt has been advised about the importance in completing this exam.  Diabetic Foot Exam: Completed 06-06-2020. Pt has been advised about the importance in completing this exam.   Interpreter Needed?: No  Information entered by :: [name] LPN   Activities of Daily Living In your present state of health, do you have any difficulty performing the following activities: 08/26/2020  Hearing? Y  Vision? N  Difficulty concentrating or making decisions? N  Walking or climbing stairs? Y  Dressing or bathing? Y  Doing errands, shopping? Y  Preparing Food and eating ? Y  Using the Toilet? Y  Comment wears pads  In the past six months, have you accidently leaked urine? Y  Do you have problems with loss of bowel control? N  Managing your Medications? Y  Managing your Finances? Y  Housekeeping or managing your Housekeeping? Y  Some recent data might be hidden    Patient Care Team: [name], MD as PCP - General (Family Medicine) Germaine Pomfret, [facility] as Pharmacist (Pharmacist)  Indicate any recent Medical Services you may have received from other than [facility] providers in the past year (date may be approximate).     Assessment:   This is a routine wellness examination for Chelsea Dean.  Hearing/Vision screen Hearing Screening - Comments:: Hearing aids both ears Vision Screening - Comments:: Dr. Orvan Seen  Cataract surgery 2022  Dietary issues and exercise activities discussed: Current Exercise Habits: Home exercise routine, Type of exercise: stretching, Time (Minutes): 20, Frequency (Times/Week): 3, Weekly Exercise (Minutes/Week): 60, Intensity: Mild   Goals Addressed             This Visit's Progress    Patient Stated       Continue to maintain current lifestyle        Depression Screen PHQ 2/9 Scores 08/26/2020 06/06/2020 03/25/2020 05/09/2019 03/26/2019  PHQ - 2 Score 0 0 0 0 0  PHQ- 9 Score - - -  - 0    Fall Risk Fall Risk  06/06/2020 03/25/2020 05/09/2019  Falls  in the past year? 0 0 0  Number falls in past yr: - - 0  Injury with Fall? - - 0  Follow up - - Education provided;Falls prevention discussed    FALL RISK PREVENTION PERTAINING TO THE HOME:  Any stairs in or around the home? No  If so, are there any without handrails? No  Home free of loose throw rugs in walkways, pet beds, electrical cords, etc? Yes  Adequate lighting in your home to reduce risk of falls? Yes   ASSISTIVE DEVICES UTILIZED TO PREVENT FALLS:  Life alert? No  Use of a cane, walker or w/c? Yes  Grab bars in the bathroom? Yes  Shower chair or bench in shower? Yes  Elevated toilet seat or a handicapped toilet? Yes   TIMED UP AND GO:  Was the test performed? No .  Tele-Health Visit  Cognitive Function:  Normal cognitive status assessed by direct observation by this Nurse Health Advisor. No abnormalities found.   MMSE - Mini Mental State Exam 05/09/2019  Not completed: Refused;Unable to complete        Immunizations Immunization History  Administered Date(s) Administered   Fluad Quad(high Dose 65+) 12/05/2018, 12/05/2019   Influenza, High Dose Seasonal PF 01/17/2014, 10/23/2014, 12/28/2017   Influenza,inj,Quad PF,6+ Mos 11/21/2015   Influenza-Unspecified 01/02/2013, 02/23/2013, 01/17/2014, 10/23/2014   Moderna Sars-Covid-2 Vaccination 04/17/2019, 05/15/2019   PPD Test 10/27/2016   Pneumococcal Conjugate-13 10/23/2014   Pneumococcal Polysaccharide-23 08/29/2007    TDAP status: Due, Education has been provided regarding the importance of this vaccine. Advised may receive this vaccine at local pharmacy or Health Dept. Aware to provide a copy of the vaccination record if obtained from local pharmacy or Health Dept. Verbalized acceptance and understanding.  Flu Vaccine status: Up to date  Pneumococcal vaccine status: Up to date  Covid-19 vaccine status: Completed vaccines  Qualifies for  Shingles Vaccine? Yes   Zostavax completed No   Shingrix Completed?: No.    Education has been provided regarding the importance of this vaccine. Patient has been advised to call insurance company to determine out of pocket expense if they have not yet received this vaccine. Advised may also receive vaccine at local pharmacy or Health Dept. Verbalized acceptance and understanding.  Screening Tests Health Maintenance  Topic Date Due   TETANUS/TDAP  Never done   Zoster Vaccines- Shingrix (1 of 2) Never done   DEXA SCAN  Never done   COVID-19 Vaccine (3 - Moderna risk series) 06/12/2019   INFLUENZA VACCINE  09/15/2020   HEMOGLOBIN A1C  12/06/2020   OPHTHALMOLOGY EXAM  02/24/2021   FOOT EXAM  06/06/2021   PNA vac Low Risk Adult  Completed   HPV VACCINES  Aged Out    Health Maintenance  Health Maintenance Due  Topic Date Due   TETANUS/TDAP  Never done   Zoster Vaccines- Shingrix (1 of 2) Never done   DEXA SCAN  Never done   COVID-19 Vaccine (3 - Moderna risk series) 06/12/2019    Colorectal cancer screening: No longer required.   Mammogram status: No longer required due to age.  Bone Density -  Education Provided  Lung Cancer Screening: (Low Dose CT Chest recommended if Age 67-80 years, 30 pack-year currently smoking OR have quit w/in 15years.) does not qualify.   Lung Cancer Screening Referral: NA  Additional Screening:  Hepatitis C Screening: does qualify;  Vision Screening: Recommended annual ophthalmology exams for early detection of glaucoma and other disorders of the eye. Is the patient  up to date with their annual eye exam?  Yes  Who is the provider or what is the name of the office in which the patient attends annual eye exams? Dr Orvan Seen If pt is not established with a provider, would they like to be referred to a provider to establish care?  established .   Dental Screening: Recommended annual dental exams for proper oral hygiene  Community Resource Referral /  Chronic Care Management: CRR required this visit?  No   CCM required this visit?  No      Plan:     I have personally reviewed and noted the following in the patient's chart:   Medical and social history Use of alcohol, tobacco or illicit drugs  Current medications and supplements including opioid prescriptions.  Functional ability and status Nutritional status Physical activity Advanced directives List of other physicians Hospitalizations, surgeries, and ER visits in previous 12 months Vitals Screenings to include cognitive, depression, and falls Referrals and appointments  In addition, I have reviewed and discussed with patient certain preventive protocols, quality metrics, and best practice recommendations. A written personalized care plan for preventive services as well as general preventive health recommendations were provided to patient.     [name], LPN   [id number]   Nurse Notes:   NA

## 2020-09-08 ENCOUNTER — Ambulatory Visit: Payer: Medicare Other | Admitting: Family Medicine

## 2020-09-08 ENCOUNTER — Telehealth: Payer: Self-pay

## 2020-09-08 NOTE — Progress Notes (Signed)
Chronic Care Management Pharmacy Assistant   Name: Chelsea Dean  MRN: [id number] DOB: 03/23/1927  Reason for Encounter:Diabetes and Hypertension Disease State Call.   Recent office visits:  08/26/2020 Chelsea Kennedy LPN (PCP Office) No Medication Changes noted  Recent consult visits:  No Recent [facility] visits:  None in previous 6 months  Medications: Outpatient Encounter Medications as of 09/08/2020  Medication Sig   acetaminophen (TYLENOL) 650 MG CR tablet Take 650-1,300 mg by mouth every 8 (eight) hours as needed for pain.   amLODipine (NORVASC) 10 MG tablet TAKE 1 TABLET BY MOUTH EVERY DAY FOR BLOOD PRESSURE   atorvastatin (LIPITOR) 10 MG tablet TAKE 1 TABLET BY MOUTH EVERY DAY   diclofenac sodium (VOLTAREN) 1 % GEL Apply 2 g topically daily as needed (KNEE PAIN).   diphenhydrAMINE (BENADRYL) 50 MG tablet Take 50 mg by mouth daily as needed (Contrast Dye). (Patient not taking: No sig reported)   ELIQUIS 5 MG TABS tablet TAKE 1 TABLET BY MOUTH TWICE DAILY FOR 30 DAYS (02/13/20 TO INDEFINITELY)   glipiZIDE (GLUCOTROL XL) 10 MG 24 hr tablet TAKE 1 TABLET BY MOUTH EVERY DAY WITH BREAKFAST   Lidocaine (HM LIDOCAINE PATCH) 4 % PTCH Apply 1 patch topically every 12 (twelve) hours.   losartan (COZAAR) 100 MG tablet TAKE 1 TABLET BY MOUTH EVERY DAY   metoprolol tartrate (LOPRESSOR) 100 MG tablet TAKE 1 TABLET BY MOUTH 2 TIMES A DAY   No facility-administered encounter medications on file as of 09/08/2020.    Care Gaps: Tetanus Vaccine Shingrix Vaccine DEXA Scan COVID-19 Vaccine Star Rating Drugs: Losartan 100 mg last filled 07/07/2020 90 day supply at [facility]. Atorvastatin 10 mg  last filled 08/26/2020 90 day supply at [facility]. Glipizide 10 mg last filled 06/12/2020 90 day supply at [facility]. Medication Fill Gaps: N/A  Recent Relevant Labs: Lab Results  Component Value Date/Time   HGBA1C 5.7 06/06/2020 10:21 AM   HGBA1C 6.5 09/21/2019  10:40 AM   MICROALBUR 40.1 (H) 06/06/2020 10:21 AM   MICROALBUR 83.5 (H) 09/21/2019 10:51 AM    Kidney Function Lab Results  Component Value Date/Time   CREATININE 0.92 06/06/2020 10:21 AM   CREATININE 1.07 (H) 04/02/2020 05:48 AM   GFR 54.60 (L) 06/06/2020 10:21 AM   GFRNONAA 49 (L) 04/02/2020 05:48 AM   GFRAA 51 (L) 04/01/2018 06:54 PM    Current antihyperglycemic regimen:  ? Glipizide XL 10 mg daily   Patient daughter denies any issue or sides from glipizide What recent interventions/DTPs have been made to improve glycemic control:  None ID Have there been any recent hospitalizations or ED visits since last visit with CPP? No Patient daughter denies hypoglycemic symptoms, including Pale, Sweaty, Shaky, Hungry, Nervous/irritable, and Vision changes Patient daughter denies hyperglycemic symptoms, including blurry vision, excessive thirst, fatigue, polyuria, and weakness How often are you checking your blood sugar? Infrequently  What are your blood sugars ranging?  Patient daughter states she is unsure of her blood sugar reading at the moment.Patient daughter states her blood sugar is not "to low or to high". During the week, how often does your blood glucose drop below 70? Never Are you checking your feet daily/regularly?   Patient daughter denies numbness,pain or tingling sensation in her feet.  Adherence Review: Is the patient currently on a STATIN medication? Yes Is the patient currently on ACE/ARB medication? Yes Does the patient have >5 day gap between last estimated fill dates? No   Reviewed  chart prior to disease state call. Spoke with patient regarding BP  Recent Office Vitals: BP Readings from Last 3 Encounters:  06/06/20 (!) 144/68  05/05/20 118/71  04/02/20 (!) 123/96   Pulse Readings from Last 3 Encounters:  06/06/20 62  05/05/20 72  04/02/20 72    Wt Readings from Last 3 Encounters:  06/06/20 160 lb 9.6 oz (72.8 kg)  05/05/20 164 lb (74.4 kg)   04/01/20 164 lb 14.5 oz (74.8 kg)     Kidney Function Lab Results  Component Value Date/Time   CREATININE 0.92 06/06/2020 10:21 AM   CREATININE 1.07 (H) 04/02/2020 05:48 AM   GFR 54.60 (L) 06/06/2020 10:21 AM   GFRNONAA 49 (L) 04/02/2020 05:48 AM   GFRAA 51 (L) 04/01/2018 06:54 PM    BMP Latest Ref Rng & Units 06/06/2020 04/02/2020 04/01/2020  Glucose 70 - 99 mg/dL 105(H) 160(H) 193(H)  BUN 6 - 23 mg/dL 22 18 23   Creatinine 0.40 - 1.20 mg/dL 0.92 1.07(H) 1.06(H)  Sodium 135 - 145 mEq/L 142 143 143  Potassium 3.5 - 5.1 mEq/L 4.1 3.5 4.1  Chloride 96 - 112 mEq/L 107 110 108  CO2 19 - 32 mEq/L 26 24 23   Calcium 8.4 - 10.5 mg/dL 10.5 9.4 10.6(H)    Current antihypertensive regimen:  Amlodipine 10 mg daily Losartan 100 mg daily Metoprolol Tartrate 100 mg twice daily   Patient daughter states patient denies any issue or side effect from amlodipine, losartan, or metoprolol. Patient daughter denies headaches, dizziness or lightheaded.  How often are you checking your Blood Pressure? 1-2x per week Current home BP readings: 138/70 What recent interventions/DTPs have been made by any provider to improve Blood Pressure control since last CPP Visit: None ID  Any recent hospitalizations or ED visits since last visit with CPP? No What diet changes have been made to improve Blood Pressure Control?  Patient daughter states patient enjoys Vegetables, and  apple sauce. What exercise is being done to improve your Blood Pressure Control?  Patient daughter states patient tries to walk with her walker.Patient daughter reports they request physical therapy, but has not heard anything yet.Patient daughter states patient has appointment coming up with her PCP this month, and she is going to ask about the referral.   Adherence Review: Is the patient currently on ACE/ARB medication? Yes Does the patient have >5 day gap between last estimated fill dates? No  Patient is schedule for a telephone follow  up on 11/03/2020 with clinical pharmacist.  [place] Clinical Pharmacist Assistant [phone]

## 2020-09-09 ENCOUNTER — Ambulatory Visit: Payer: Medicare Other | Admitting: Family Medicine

## 2020-09-10 ENCOUNTER — Ambulatory Visit: Payer: Medicare Other | Admitting: Family Medicine

## 2020-09-18 ENCOUNTER — Ambulatory Visit (INDEPENDENT_AMBULATORY_CARE_PROVIDER_SITE_OTHER): Payer: Medicare Other | Admitting: Family Medicine

## 2020-09-18 ENCOUNTER — Encounter: Payer: Self-pay | Admitting: Family Medicine

## 2020-09-18 ENCOUNTER — Other Ambulatory Visit: Payer: Self-pay

## 2020-09-18 VITALS — BP 118/74 | HR 64 | Temp 97.0°F | Ht 62.0 in | Wt 154.4 lb

## 2020-09-18 DIAGNOSIS — M25562 Pain in left knee: Secondary | ICD-10-CM

## 2020-09-18 DIAGNOSIS — M25561 Pain in right knee: Secondary | ICD-10-CM

## 2020-09-18 DIAGNOSIS — E7800 Pure hypercholesterolemia, unspecified: Secondary | ICD-10-CM

## 2020-09-18 DIAGNOSIS — E119 Type 2 diabetes mellitus without complications: Secondary | ICD-10-CM | POA: Diagnosis not present

## 2020-09-18 DIAGNOSIS — I10 Essential (primary) hypertension: Secondary | ICD-10-CM

## 2020-09-18 DIAGNOSIS — G8929 Other chronic pain: Secondary | ICD-10-CM | POA: Diagnosis not present

## 2020-09-18 DIAGNOSIS — N1831 Chronic kidney disease, stage 3a: Secondary | ICD-10-CM

## 2020-09-18 LAB — COMPREHENSIVE METABOLIC PANEL
ALT: 7 U/L (ref 0–35)
AST: 13 U/L (ref 0–37)
Albumin: 4 g/dL (ref 3.5–5.2)
Alkaline Phosphatase: 50 U/L (ref 39–117)
BUN: 30 mg/dL — ABNORMAL HIGH (ref 6–23)
CO2: 27 mEq/L (ref 19–32)
Calcium: 10.7 mg/dL — ABNORMAL HIGH (ref 8.4–10.5)
Chloride: 105 mEq/L (ref 96–112)
Creatinine, Ser: 1.4 mg/dL — ABNORMAL HIGH (ref 0.40–1.20)
GFR: 32.92 mL/min — ABNORMAL LOW (ref >60.00)
Glucose, Bld: 155 mg/dL — ABNORMAL HIGH (ref 70–99)
Potassium: 3.7 mEq/L (ref 3.5–5.1)
Sodium: 141 mEq/L (ref 135–145)
Total Bilirubin: 0.6 mg/dL (ref 0.2–1.2)
Total Protein: 6.5 g/dL (ref 6.0–8.3)

## 2020-09-18 LAB — CBC
HCT: 37.9 % (ref 36.0–46.0)
Hemoglobin: 11.7 g/dL — ABNORMAL LOW (ref 12.0–15.0)
MCHC: 30.8 g/dL (ref 30.0–36.0)
MCV: 82.5 fl (ref 78.0–100.0)
Platelets: 88 10*3/uL — ABNORMAL LOW (ref 150.0–400.0)
RBC: 4.59 Mil/uL (ref 3.87–5.11)
RDW: 16.3 % — ABNORMAL HIGH (ref 11.5–15.5)
WBC: 4.6 10*3/uL (ref 4.0–10.5)

## 2020-09-18 LAB — HEMOGLOBIN A1C: Hgb A1c MFr Bld: 6.3 % (ref 4.6–6.5)

## 2020-09-18 MED ORDER — TRAMADOL HCL 50 MG PO TABS: 50.0000 mg | ORAL_TABLET | Freq: Two times a day (BID) | ORAL | 0 refills | Status: DC | PRN

## 2020-09-18 NOTE — Progress Notes (Addendum)
Established Patient Office Visit  Subjective:  Patient ID: Chelsea Dean, female    DOB: 1927-09-21  Age: 87 y.o. MRN: [id number]  CC:  Chief Complaint  Patient presents with   Follow-up    3 month follow up, no concerns.     HPI Chelsea Dean presents for follow-up of hypertension, diabetes elevated cholesterol and chronic knee pain.  Blood pressure is well controlled with amlodipine losartan and metoprolol.  Continues these medications without issue.  Continues to tolerate glipizide well for control of her diabetes.  No issues taking atorvastatin.  She remains semi ambulatory secondary to knee pain and uses a wheelchair for long distances.  Ultram is helping her knee pain great deal.  Past Medical History:  Diagnosis Date   A-fib ([place]) 10/19/2016   Abdominal aortic aneurysm (AAA) without rupture ([place]) 09/15/2013   Acute idiopathic gout of right foot    AKI (acute kidney injury) ([place]) 10/18/2016   Anticoagulated on Coumadin    Arthritis    Diabetes mellitus without complication ([place])    Diverticular disease of large intestine 04/16/2013   Diverticulosis    DM2 (diabetes mellitus, type 2) ([place]) 10/19/2016   Dysrhythmia    Atrial fib   History of colon polyps 04/16/2013   Hx of colonic polyps    Hyperlipidemia    Hypertension    Long term current use of anticoagulant therapy 02/05/2015   Lumbar radiculopathy 03/28/2014   Microalbuminuria 04/16/2013   Osteoarthritis 04/16/2013   Osteoarthritis of cervical spine 03/28/2014   Pelvic mass 10/19/2016   Right renal mass    SBO (small bowel obstruction) ([place]) 10/18/2016   Thrombocytopenia ([place]) 04/16/2013   Overview:  referred to hematology 01/2104    Past Surgical History:  Procedure Laterality Date   ABDOMINAL AORTIC ANEURYSM REPAIR     ABDOMINAL HYSTERECTOMY     IR ANGIOGRAM EXTREMITY LEFT  04/01/2020   IR ANGIOGRAM PELVIS SELECTIVE OR SUPRASELECTIVE  04/01/2020   IR ANGIOGRAM SELECTIVE EACH ADDITIONAL VESSEL  04/01/2020   IR ANGIOGRAM  SELECTIVE EACH ADDITIONAL VESSEL  04/01/2020   IR ANGIOGRAM SELECTIVE EACH ADDITIONAL VESSEL  04/01/2020   IR AORTAGRAM ABDOMINAL SERIALOGRAM  04/01/2020   IR EMBO ARTERIAL NOT HEMORR HEMANG INC GUIDE ROADMAPPING  04/01/2020   IR GENERIC HISTORICAL  07/01/2015   IR RADIOLOGIST EVAL & MGMT 07/01/2015 [facility] INTERV RAD   IR RADIOLOGIST EVAL & MGMT  03/11/2020   IR RADIOLOGIST EVAL & MGMT  04/30/2020   IR THROMBECT PRIM MECH INIT (INCLU) MOD SED  04/01/2020   IR US GUIDE VASC ACCESS LEFT  04/01/2020   IR US GUIDE VASC ACCESS LEFT  04/01/2020   IR US GUIDE VASC ACCESS RIGHT  04/01/2020   THROMBECTOMY FEMORAL ARTERY Left 04/01/2020   Procedure: THROMBECTOMY LEFT POPLITEAL ARTERY;  Surgeon: Rosetta Posner, MD;  Location: [facility] OR;  Service: Vascular;  Laterality: Left;    Family History  Problem Relation Age of Onset   Cancer Sister        Rectal and Stomach    Social History   Socioeconomic History   Marital status: Married    Spouse name: Not on file   Number of children: Not on file   Years of education: Not on file   Highest education level: Not on file  Occupational History   Occupation: retired [employer]  Tobacco Use   Smoking status: Never   Smokeless tobacco: Current    Types: Snuff  Vaping Use   Vaping Use: Never  used  Substance and Sexual Activity   Alcohol use: No    Alcohol/week: 0.0 standard drinks   Drug use: No   Sexual activity: Not on file  Other Topics Concern   Not on file  Social History Narrative   Admitted to [facility] 10/27/16   Married   Use snuff   Alcohol none   DNR   Social Determinants of Health   Financial Resource Strain: Low Risk    Difficulty of Paying Living Expenses: Not hard at all  Food Insecurity: No Food Insecurity   Worried About Charity fundraiser in the Last Year: Never true   [place] in the Last Year: Never true  Transportation Needs: No Transportation Needs   Lack of Transportation (Medical): No   Lack of  Transportation (Non-Medical): No  Physical Activity: Insufficiently Active   Days of Exercise per Week: 3 days   Minutes of Exercise per Session: 10 min  Stress: No Stress Concern Present   Feeling of Stress : Not at all  Social Connections: Socially Isolated   Frequency of Communication with Friends and Family: Three times a week   Frequency of Social Gatherings with Friends and Family: Once a week   Attends Religious Services: Never   Marine scientist or Organizations: No   Attends Archivist Meetings: Never   Marital Status: Widowed  Human resources officer Violence: Not At Risk   Fear of Current or Ex-Partner: No   Emotionally Abused: No   Physically Abused: No   Sexually Abused: No    Outpatient Medications Prior to Visit  Medication Sig Dispense Refill   acetaminophen (TYLENOL) 650 MG CR tablet Take 650-1,300 mg by mouth every 8 (eight) hours as needed for pain.     amLODipine (NORVASC) 10 MG tablet TAKE 1 TABLET BY MOUTH EVERY DAY FOR BLOOD PRESSURE 90 tablet 1   atorvastatin (LIPITOR) 10 MG tablet TAKE 1 TABLET BY MOUTH EVERY DAY 90 tablet 3   diclofenac sodium (VOLTAREN) 1 % GEL Apply 2 g topically daily as needed (KNEE PAIN).     ELIQUIS 5 MG TABS tablet TAKE 1 TABLET BY MOUTH TWICE DAILY FOR 30 DAYS (02/13/20 TO INDEFINITELY) 60 tablet 5   glipiZIDE (GLUCOTROL XL) 10 MG 24 hr tablet TAKE 1 TABLET BY MOUTH EVERY DAY WITH BREAKFAST 90 tablet 2   Lidocaine (HM LIDOCAINE PATCH) 4 % PTCH Apply 1 patch topically every 12 (twelve) hours. 14 patch 0   losartan (COZAAR) 100 MG tablet TAKE 1 TABLET BY MOUTH EVERY DAY 90 tablet 1   metoprolol tartrate (LOPRESSOR) 100 MG tablet TAKE 1 TABLET BY MOUTH 2 TIMES A DAY 180 tablet 1   diphenhydrAMINE (BENADRYL) 50 MG tablet Take 50 mg by mouth daily as needed (Contrast Dye). (Patient not taking: No sig reported)     No facility-administered medications prior to visit.    Allergies  Allergen Reactions   Ace Inhibitors Shortness  Of Breath and Rash   Contrast Media [Iodinated Diagnostic Agents] Shortness Of Breath and Rash   Nsaids     Unknown reaction    ROS Review of Systems  Constitutional: Negative.   HENT:  Positive for hearing loss.   Eyes:  Negative for photophobia and visual disturbance.  Respiratory: Negative.    Cardiovascular: Negative.   Gastrointestinal: Negative.   Genitourinary:  Negative for dysuria, flank pain and urgency.  Musculoskeletal:  Positive for arthralgias and gait problem.  Neurological:  Negative for speech difficulty and weakness.  Psychiatric/Behavioral: Negative.       Objective:    Physical Exam Vitals and nursing note reviewed.  Constitutional:      General: She is not in acute distress.    Appearance: Normal appearance. She is not ill-appearing, toxic-appearing or diaphoretic.  HENT:     Head: Normocephalic and atraumatic.  Eyes:     General: No scleral icterus.       Right eye: No discharge.        Left eye: No discharge.     Extraocular Movements: Extraocular movements intact.     Conjunctiva/sclera: Conjunctivae normal.  Cardiovascular:     Rate and Rhythm: Normal rate and regular rhythm.  Pulmonary:     Effort: Pulmonary effort is normal.     Breath sounds: Normal breath sounds.  Musculoskeletal:     Right lower leg: No edema.     Left lower leg: No edema.  Neurological:     Mental Status: She is alert and oriented to person, place, and time.  Psychiatric:        Mood and Affect: Mood normal.        Behavior: Behavior normal.    BP 118/74 (BP Location: Left Arm, Patient Position: Sitting, Cuff Size: Normal)   Pulse 64   Temp (!) 97 F (36.1 C) (Temporal)   Ht 5\' 2"  (1.575 m)   Wt 154 lb 6.4 oz (70 kg)   SpO2 97%   BMI 28.24 kg/m  Wt Readings from Last 3 Encounters:  09/18/20 154 lb 6.4 oz (70 kg)  06/06/20 160 lb 9.6 oz (72.8 kg)  05/05/20 164 lb (74.4 kg)     Health Maintenance Due  Topic Date Due   TETANUS/TDAP  Never done   Zoster  Vaccines- Shingrix (1 of 2) Never done   DEXA SCAN  Never done   INFLUENZA VACCINE  09/15/2020    There are no preventive care reminders to display for this patient.  Lab Results  Component Value Date   TSH 3.26 03/02/2017   Lab Results  Component Value Date   WBC 4.7 06/06/2020   HGB 11.5 (L) 06/06/2020   HCT 36.2 06/06/2020   MCV 83.6 06/06/2020   PLT 94.0 (L) 06/06/2020   Lab Results  Component Value Date   NA 142 06/06/2020   K 4.1 06/06/2020   CO2 26 06/06/2020   GLUCOSE 105 (H) 06/06/2020   BUN 22 06/06/2020   CREATININE 0.92 06/06/2020   BILITOT 0.6 06/06/2020   ALKPHOS 54 06/06/2020   AST 14 06/06/2020   ALT 7 06/06/2020   PROT 6.7 06/06/2020   ALBUMIN 4.1 06/06/2020   CALCIUM 10.5 06/06/2020   ANIONGAP 9 04/02/2020   GFR 54.60 (L) 06/06/2020   Lab Results  Component Value Date   CHOL 134 06/06/2020   Lab Results  Component Value Date   HDL 33.10 (L) 06/06/2020   Lab Results  Component Value Date   LDLCALC 73 06/06/2020   Lab Results  Component Value Date   TRIG 135.0 06/06/2020   Lab Results  Component Value Date   CHOLHDL 4 06/06/2020   Lab Results  Component Value Date   HGBA1C 5.7 06/06/2020      Assessment & Plan:   Problem List Items Addressed This Visit       Cardiovascular and Mediastinum   Essential hypertension   Relevant Orders   CBC   Comprehensive metabolic panel     Endocrine  Type 2 diabetes mellitus without complication, without long-term current use of insulin (HCC) - Primary   Relevant Orders   Comprehensive metabolic panel   Hemoglobin A1c     Genitourinary   Stage 3 chronic kidney disease (HCC)   Relevant Orders   Comprehensive metabolic panel     Other   Chronic pain of both knees   Relevant Medications   traMADol (ULTRAM) 50 MG tablet   Elevated LDL cholesterol level   Relevant Orders   Comprehensive metabolic panel    Meds ordered this encounter  Medications   traMADol (ULTRAM) 50 MG tablet     Sig: Take 1 tablet (50 mg total) by mouth every 12 (twelve) hours as needed.    Dispense:  60 tablet    Refill:  0    Follow-up: Return in about 3 months (around 12/19/2020), or advise shingrix.  Continue medications as above.  Use tramadol as needed for knee pain continue wheelchair as needed.  Advise Shingrix vaccine.  [name], MD

## 2020-10-03 ENCOUNTER — Telehealth: Payer: Self-pay

## 2020-10-03 NOTE — Progress Notes (Signed)
Chronic Care Management Pharmacy Assistant   Name: Chelsea Dean  MRN: [id number] DOB: 1927-06-08  Reason for Encounter:Diabetes Disease State Call.  Recent office visits:  09/18/2020 Dr.Kremer MD (PCP) start Tramadol 50 mg PRN  Recent consult visits:  No recent [facility] visits:  None in previous 6 months  Medications: Outpatient Encounter Medications as of 10/03/2020  Medication Sig   acetaminophen (TYLENOL) 650 MG CR tablet Take 650-1,300 mg by mouth every 8 (eight) hours as needed for pain.   amLODipine (NORVASC) 10 MG tablet TAKE 1 TABLET BY MOUTH EVERY DAY FOR BLOOD PRESSURE   atorvastatin (LIPITOR) 10 MG tablet TAKE 1 TABLET BY MOUTH EVERY DAY   diclofenac sodium (VOLTAREN) 1 % GEL Apply 2 g topically daily as needed (KNEE PAIN).   ELIQUIS 5 MG TABS tablet TAKE 1 TABLET BY MOUTH TWICE DAILY FOR 30 DAYS (02/13/20 TO INDEFINITELY)   glipiZIDE (GLUCOTROL XL) 10 MG 24 hr tablet TAKE 1 TABLET BY MOUTH EVERY DAY WITH BREAKFAST   Lidocaine (HM LIDOCAINE PATCH) 4 % PTCH Apply 1 patch topically every 12 (twelve) hours.   losartan (COZAAR) 100 MG tablet TAKE 1 TABLET BY MOUTH EVERY DAY   metoprolol tartrate (LOPRESSOR) 100 MG tablet TAKE 1 TABLET BY MOUTH 2 TIMES A DAY   traMADol (ULTRAM) 50 MG tablet Take 1 tablet (50 mg total) by mouth every 12 (twelve) hours as needed.   No facility-administered encounter medications on file as of 10/03/2020.    Care Gaps: Tetanus Vaccine Shingrix Vaccine DEXA Scan Influenza Vaccine  Star Rating Drugs: Losartan 100 mg last filled 07/07/2020 90 day supply at [facility]. Atorvastatin 10 mg  last filled 08/26/2020 90 day supply at [facility]. Glipizide 10 mg last filled 09/18/2020 90 day supply at [facility]. Medication Fill Gaps: N/A  Recent Relevant Labs: Lab Results  Component Value Date/Time   HGBA1C 6.3 09/18/2020 02:29 PM   HGBA1C 5.7 06/06/2020 10:21 AM   MICROALBUR 40.1 (H) 06/06/2020 10:21 AM    MICROALBUR 83.5 (H) 09/21/2019 10:51 AM    Kidney Function Lab Results  Component Value Date/Time   CREATININE 1.40 (H) 09/18/2020 02:29 PM   CREATININE 0.92 06/06/2020 10:21 AM   GFR 32.92 (L) 09/18/2020 02:29 PM   GFRNONAA 49 (L) 04/02/2020 05:48 AM   GFRAA 51 (L) 04/01/2018 06:54 PM    Current antihyperglycemic regimen:  Glipizide XL 10 mg daily  What recent interventions/DTPs have been made to improve glycemic control:  None ID Have there been any recent hospitalizations or ED visits since last visit with CPP? No Patient denies hypoglycemic symptoms, including Pale, Sweaty, Shaky, Hungry, Nervous/irritable, and Vision changes Patient denies hyperglycemic symptoms, including blurry vision, excessive thirst, fatigue, polyuria, and weakness How often are you checking your blood sugar? Infrequently  What are your blood sugars ranging?  Patient daughter states she is unsure of her blood sugar reading at the moment.Patient daughter states her blood sugar is not "to low or to high". During the week, how often does your blood glucose drop below 70?  N/A Are you checking your feet daily/regularly?    Patient daughter denies numbness,pain or tingling sensation in her feet. Adherence Review: Is the patient currently on a STATIN medication? Yes Is the patient currently on ACE/ARB medication? Yes Does the patient have >5 day gap between last estimated fill dates? No  Patient is schedule for a telephone follow up with a clinical pharmacist on 11/03/2020.  [place] Pharmacist Assistant  [phone]    

## 2020-10-04 ENCOUNTER — Other Ambulatory Visit: Payer: Self-pay | Admitting: Family Medicine

## 2020-10-04 DIAGNOSIS — E119 Type 2 diabetes mellitus without complications: Secondary | ICD-10-CM

## 2020-10-04 DIAGNOSIS — I10 Essential (primary) hypertension: Secondary | ICD-10-CM

## 2020-10-04 DIAGNOSIS — N183 Chronic kidney disease, stage 3 unspecified: Secondary | ICD-10-CM

## 2020-10-31 ENCOUNTER — Telehealth: Payer: Self-pay

## 2020-10-31 NOTE — Progress Notes (Signed)
APPOINTMENT REMINDER  Chelsea Dean daughter was reminded to have all medications, supplements and any blood glucose and blood pressure readings available for review with Chelsea Dean Pharm. D, at her telephone visit on 11/03/2020 at 11:00 am .  Patient daughter Radiation protection practitioner.  Questions: Have you had any recent office visit or specialist visit outside of [facility]? No Are there any concerns you would like to discuss during your office visit?  Patient daughter states patient denies any issue side effects from her medication.  Patient daughter states she was inform that she had  appointment with the clinical pharmacist on 11/03/2020 not her mother. I informed patient her appointment is schedule for initial visit on 11/24/2020 with the clinical pharmacist.Ask patient if she would like me to reschedule it, and try to see if her appointment can be moved up.Patient states no that's okay, she was just inform wrong, but she will keeps the appointments as it is. Apologize to the patient for being miss informed.  [place] Pharmacist Assistant [phone]

## 2020-11-03 ENCOUNTER — Ambulatory Visit (INDEPENDENT_AMBULATORY_CARE_PROVIDER_SITE_OTHER): Payer: Medicare Other

## 2020-11-03 DIAGNOSIS — E119 Type 2 diabetes mellitus without complications: Secondary | ICD-10-CM

## 2020-11-03 DIAGNOSIS — E1159 Type 2 diabetes mellitus with other circulatory complications: Secondary | ICD-10-CM

## 2020-11-03 DIAGNOSIS — I482 Chronic atrial fibrillation, unspecified: Secondary | ICD-10-CM

## 2020-11-03 NOTE — Progress Notes (Signed)
Chronic Care Management Pharmacy Note  11/04/2020 Name:  Chelsea Dean MRN:  [id number] DOB:  02/09/1928  Summary: Patient presents for CCM follow-up. Today's visit was 100% conducted with Genene Churn, the patient's daughter and caregiver. She reports her mother has been doing well and has no complaints regarding her current medications  Recommendations/Changes made from today's visit: Continue current medications  Plan: CPP follow-up 6 months   Subjective: Chelsea Dean is an 87 y.o. year old female who is a primary patient of Chelsea Hal Mortimer Fries, MD.  The CCM team was consulted for assistance with disease management and care coordination needs.    Engaged with patient by telephone for follow up visit in response to provider referral for pharmacy case management and/or care coordination services.   Consent to Services:  The patient was given information about Chronic Care Management services, agreed to services, and gave verbal consent prior to initiation of services.  Please see initial visit note for detailed documentation.   Patient Care Team: [name], MD as PCP - General (Family Medicine) Chelsea Dean, [facility] as Pharmacist (Pharmacist)  Recent office visits: 09/18/20: Patient presented to Dr. Ethelene Hal for follow-up. eGFR 33 mL/min  06/06/20: Patient presented to Dr. Ethelene Hal for follow-up. Microalbuminuria  03/25/20: Patient presented to Dr. Gena Dean for follow-up. Diphenhydramine, PEG, prednisone, tramadol, warfarin stopped. Eliquis started.   Recent consult visits: 05/05/20: Patient presented to Dr. Trula Dean (Vascular) for post-op follow-up. Oxycodone-APAP, prednisolone, prednisone stopped.   Hospital visits: 2/15-2/16/22: Patient hospitalized for AAA thrombectomy of left popliteal artery.   02/11/20: Patient presented to ED for bleeding. 02/08/20: Patient presented to ED for gout of left knee.   Objective:  Lab Results  Component Value Date   CREATININE  1.40 (H) 09/18/2020   BUN 30 (H) 09/18/2020   GFR 32.92 (L) 09/18/2020   GFRNONAA 49 (L) 04/02/2020   GFRAA 51 (L) 04/01/2018   NA 141 09/18/2020   K 3.7 09/18/2020   CALCIUM 10.7 (H) 09/18/2020   CO2 27 09/18/2020   GLUCOSE 155 (H) 09/18/2020    Lab Results  Component Value Date/Time   HGBA1C 6.3 09/18/2020 02:29 PM   HGBA1C 5.7 06/06/2020 10:21 AM   GFR 32.92 (L) 09/18/2020 02:29 PM   GFR 54.60 (L) 06/06/2020 10:21 AM   MICROALBUR 40.1 (H) 06/06/2020 10:21 AM   MICROALBUR 83.5 (H) 09/21/2019 10:51 AM    Last diabetic Eye exam: No results found for: HMDIABEYEEXA  Last diabetic Foot exam: No results found for: HMDIABFOOTEX   Lab Results  Component Value Date   CHOL 134 06/06/2020   HDL 33.10 (L) 06/06/2020   LDLCALC 73 06/06/2020   LDLDIRECT 81.0 09/21/2019   TRIG 135.0 06/06/2020   CHOLHDL 4 06/06/2020    Hepatic Function Latest Ref Rng & Units 09/18/2020 06/06/2020 09/21/2019  Total Protein 6.0 - 8.3 g/dL 6.5 6.7 7.4  Albumin 3.5 - 5.2 g/dL 4.0 4.1 4.4  AST 0 - 37 U/L 13 14 18   ALT 0 - 35 U/L 7 7 12   Alk Phosphatase 39 - 117 U/L 50 54 49  Total Bilirubin 0.2 - 1.2 mg/dL 0.6 0.6 0.6    Lab Results  Component Value Date/Time   TSH 3.26 03/02/2017 02:42 PM    CBC Latest Ref Rng & Units 09/18/2020 06/06/2020 04/02/2020  WBC 4.0 - 10.5 K/uL 4.6 4.7 9.0  Hemoglobin 12.0 - 15.0 g/dL 11.7(L) 11.5(L) 9.8(L)  Hematocrit 36.0 - 46.0 % 37.9 36.2 31.7(L)  Platelets 150.0 - 400.0 K/uL 88.0(L)  94.0(L) 93(L)    No results found for: VD25OH  Clinical ASCVD: No  The ASCVD Risk score (Arnett DK, et al., 2019) failed to calculate for the following reasons:   The 2019 ASCVD risk score is only valid for ages 61 to 18    Depression screen PHQ 2/9 09/18/2020 08/26/2020 06/06/2020  Decreased Interest 0 0 0  Down, Depressed, Hopeless 0 0 0  PHQ - 2 Score 0 0 0  Altered sleeping - - -  Tired, decreased energy - - -  Change in appetite - - -  Feeling bad or failure about yourself  - - -   Trouble concentrating - - -  Moving slowly or fidgety/restless - - -  Suicidal thoughts - - -  PHQ-9 Score - - -     Social History   Tobacco Use  Smoking Status Never  Smokeless Tobacco Current   Types: Snuff   BP Readings from Last 3 Encounters:  09/18/20 118/74  06/06/20 (!) 144/68  05/05/20 118/71   Pulse Readings from Last 3 Encounters:  09/18/20 64  06/06/20 62  05/05/20 72   Wt Readings from Last 3 Encounters:  09/18/20 154 lb 6.4 oz (70 kg)  06/06/20 160 lb 9.6 oz (72.8 kg)  05/05/20 164 lb (74.4 kg)   BMI Readings from Last 3 Encounters:  09/18/20 28.24 kg/m  06/06/20 29.37 kg/m  05/05/20 30.00 kg/m    Assessment/Interventions: Review of patient past medical history, allergies, medications, health status, including review of consultants reports, laboratory and other test data, was performed as part of comprehensive evaluation and provision of chronic care management services.   SDOH:  (Social Determinants of Health) assessments and interventions performed: Yes SDOH Interventions    Flowsheet Row Most Recent Value  SDOH Interventions   Financial Strain Interventions Intervention Not Indicated        CCM Care Plan  Allergies  Allergen Reactions   Ace Inhibitors Shortness Of Breath and Rash   Contrast Media [Iodinated Diagnostic Agents] Shortness Of Breath and Rash   Nsaids     Unknown reaction    Medications Reviewed Today     Reviewed by [name], MD (Physician) on 09/18/20 at 1424  Med List Status: <None>   Medication Order Taking? Sig Documenting Provider Last Dose Status Informant  acetaminophen (TYLENOL) 650 MG CR tablet [id number] Yes Take 650-1,300 mg by mouth every 8 (eight) hours as needed for pain. [provider] Taking Active   amLODipine (NORVASC) 10 MG tablet [id number] Yes TAKE 1 TABLET BY MOUTH EVERY DAY FOR BLOOD PRESSURE [name], MD Taking Active   atorvastatin (LIPITOR) 10 MG tablet  [id number] Yes TAKE 1 TABLET BY MOUTH EVERY DAY [name], MD Taking Active   diclofenac sodium (VOLTAREN) 1 % GEL [id number] Yes Apply 2 g topically daily as needed (KNEE PAIN). [provider] Taking Active Child           Med Note Jilda Roche A   Wed Mar 26, 2020 11:11 AM)    ELIQUIS 5 MG TABS tablet [id number] Yes TAKE 1 TABLET BY MOUTH TWICE DAILY FOR 30 DAYS (02/13/20 TO INDEFINITELY) [name], MD Taking Active   glipiZIDE (GLUCOTROL XL) 10 MG 24 hr tablet [id number] Yes TAKE 1 TABLET BY MOUTH EVERY DAY WITH BREAKFAST Dutch Quint B, FNP Taking Active   Lidocaine (HM LIDOCAINE PATCH) 4 % PTCH [id number] Yes Apply 1 patch topically every 12 (twelve) hours. Delora Fuel, MD Taking Active  Family Member           Med Note Jilda Roche A   Wed Mar 26, 2020 11:12 AM)    losartan (COZAAR) 100 MG tablet [id number] Yes TAKE 1 TABLET BY MOUTH EVERY DAY [name], MD Taking Active   metoprolol tartrate (LOPRESSOR) 100 MG tablet [id number] Yes TAKE 1 TABLET BY MOUTH 2 TIMES A DAY [name], MD Taking Active   traMADol Veatrice Bourbon) 50 MG tablet [id number] Yes Take 1 tablet (50 mg total) by mouth every 12 (twelve) hours as needed. [name], MD  Active             Patient Active Problem List   Diagnosis Date Noted   Hyperlipidemia associated with type 2 diabetes mellitus ([place]) 06/06/2020   Ischemic leg 04/01/2020   Stage 4 chronic kidney disease ([place]) 09/21/2019   Elevated LDL cholesterol level 07/19/2018   Need for influenza vaccination 12/28/2017   Chronic pain of both knees 10/19/2017   Stage 3 chronic kidney disease ([place]) 07/25/2017   Hypomagnesemia 03/31/2017   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/31/2017   Abnormal urinalysis 03/16/2017   Hypokalemia 10/31/2016   Lactic acidosis 10/31/2016   Atrial fibrillation with RVR ([place]) 10/31/2016   Elevated liver enzymes 10/31/2016   Anticoagulated on Coumadin 10/31/2016   Elevated INR  10/31/2016   Hyperlipidemia 10/31/2016   Essential hypertension 10/31/2016   Acute idiopathic gout of right foot    A-fib ([place]) 10/19/2016   Type 2 diabetes mellitus without complication, without long-term current use of insulin ([place]) 10/19/2016   Pelvic mass 10/19/2016   SBO (small bowel obstruction) ([place]) 10/18/2016   Long term current use of anticoagulant therapy 02/05/2015   Lumbar radiculopathy 03/28/2014   Osteoarthritis of cervical spine 03/28/2014   Abdominal aortic aneurysm (AAA) without rupture ([place]) 09/15/2013   Diverticular disease of large intestine 04/16/2013   History of colon polyps 04/16/2013   Microalbuminuria 04/16/2013   Osteoarthritis 04/16/2013   Thrombocytopenia ([place]) 04/16/2013    Immunization History  Administered Date(s) Administered   Fluad Quad(high Dose 65+) 12/05/2018, 12/05/2019   Influenza, High Dose Seasonal PF 01/17/2014, 10/23/2014, 12/28/2017   Influenza,inj,Quad PF,6+ Mos 11/21/2015   Influenza-Unspecified 01/02/2013, 02/23/2013, 01/17/2014, 10/23/2014   Moderna Sars-Covid-2 Vaccination 04/17/2019, 05/15/2019   PPD Test 10/27/2016   Pneumococcal Conjugate-13 10/23/2014   Pneumococcal Polysaccharide-23 08/29/2007    Conditions to be addressed/monitored:  Hypertension, Hyperlipidemia, Diabetes, Atrial Fibrillation and Chronic Kidney Disease  Care Plan : General Pharmacy (Adult)  Updates made by Chelsea Dean, RPH since 11/04/2020 12:00 AM     Problem: Hypertension, Hyperlipidemia, Diabetes, Atrial Fibrillation and Chronic Kidney Disease   Priority: High     Long-Range Goal: Patient-Specific Goal   Start Date: 05/06/2020  Expected End Date: 11/04/2021  This Visit's Progress: On track  Recent Progress: On track  Priority: High  Note:   Current Barriers:  No barriers noted  Pharmacist Clinical Goal(s):  Patient will maintain control of diabetes as evidenced by A1c less than 8%  through collaboration with PharmD and provider.    Interventions: 1:1 collaboration with [name], MD regarding development and update of comprehensive plan of care as evidenced by provider attestation and co-signature Inter-disciplinary care team collaboration (see longitudinal plan of care) Comprehensive medication review performed; medication list updated in electronic medical record  Hypertension (BP goal <140/90) -Controlled -Current treatment: Amlodipine 10 mg daily  Losartan 100 mg daily  Metoprolol Tartrate 100 mg twice daily  -Medications previously tried: 135/60s   -  Current home readings: 120-130s/70s,  -Denies hypotensive/hypertensive symptoms: feels cold all the time.  -Educated on Daily salt intake goal < 2300 mg; Exercise goal of 150 minutes per week; Importance of home blood pressure monitoring; -Counseled to monitor BP at home weekly, document, and provide log at future appointments -Recommended to continue current medication  Hyperlipidemia: (LDL goal < 100) -Controlled -Current treatment: Atorvastatin 10 mg daily  -Medications previously tried: NA  -Educated on Importance of limiting foods high in cholesterol; -Recommended to continue current medication  Diabetes (A1c goal <7%) -Controlled -Current medications: Glipizide XL 10 mg daily  -Medications previously tried: NA  -Current home glucose readings fasting glucose: 108 post prandial glucose: NA -Denies hypoglycemic/hyperglycemic symptoms -Educated on A1c and blood sugar goals; Complications of diabetes including kidney damage, retinal damage, and cardiovascular disease; Prevention and management of hypoglycemic episodes; -Counseled to check feet daily and get yearly eye exams -Recommended to continue current medication  Atrial Fibrillation (Goal: prevent stroke and major bleeding) -Controlled -CHADSVASC: 6 -Current treatment: Rate control: Metoprolol Tartrate 100 mg twice daily  Anticoagulation: Eliquis 5 mg twice daily   -Medications previously tried: NA -Counseled on bleeding risk associated with Eliquis and importance of self-monitoring for signs/symptoms of bleeding; avoidance of NSAIDs due to increased bleeding risk with anticoagulants; -Recommended to continue current medication  Osteoarthritis (Goal: Minimize pain symptoms) -Controlled -Current treatment  Acetaminophen 650 CR two tablets in the morning Tramadol 50 mg twice daily as needed  Voltaren 1% gel twice daily  -Medications previously tried: NA  -Walks with walker, is careful to avoid falls  -Recommended to continue current medication  Acute on Chronic Kidney Injury -All medications assessed for renal dosing and appropriateness in chronic kidney disease. -Counseled on importance of adequate fluid intake, minimizing NSAID use.  -Recommended to continue current medication  Patient Goals/Self-Care Activities Patient will:  - check glucose daily before breakfast, document, and provide at future appointments check blood pressure weekly, document, and provide at future appointments  Follow Up Plan: Telephone follow up appointment with care management team member scheduled for:  04/27/2021 at 11:00 AM       Medication Assistance: None required.  Patient affirms current coverage meets needs.  Patient's preferred pharmacy is:  [place], [place] - [address] [place] [address] Phone: [phone] Fax: [phone]  [id number] - [place], [place] AT [place] [address] Phone: [phone] Fax: [id number] - [place], [place] - [address] AT [place] [address] [place] [address] Phone: [phone] Fax: [phone]  Uses pill box? Yes Pt endorses 100% compliance  We discussed: Current pharmacy is preferred with insurance plan and patient  is satisfied with pharmacy services Patient decided to: Continue current medication management strategy  Care Plan and Follow Up Patient Decision:  Patient agrees to Care Plan and Follow-up.  Plan: Telephone follow up appointment with care management team member scheduled for:  04/27/2021 at 11:00 AM  Doristine Section, [facility] [phone]

## 2020-11-04 NOTE — Patient Instructions (Signed)
Visit Information It was great speaking with you today!  Please let me know if you have any questions about our visit.   Goals Addressed             This Visit's Progress    Monitor and Manage My Blood Sugar-Diabetes Type 2       Timeframe:  Long-Range Goal Priority:  High Start Date:  05/06/2020                           Expected End Date:  11/06/2021                     Follow Up within 90 days   - check blood sugar at prescribed times - check blood sugar if I feel it is too high or too low - enter blood sugar readings and medication or insulin into daily log    Why is this important?   Checking your blood sugar at home helps to keep it from getting very high or very low.  Writing the results in a diary or log helps the doctor know how to care for you.  Your blood sugar log should have the time, date and the results.  Also, write down the amount of insulin or other medicine that you take.  Other information, like what you ate, exercise done and how you were feeling, will also be helpful.     Notes:         Patient Care Plan: General Pharmacy (Adult)     Problem Identified: Hypertension, Hyperlipidemia, Diabetes, Atrial Fibrillation and Chronic Kidney Disease   Priority: High     Long-Range Goal: Patient-Specific Goal   Start Date: 05/06/2020  Expected End Date: 11/04/2021  This Visit's Progress: On track  Recent Progress: On track  Priority: High  Note:   Current Barriers:  No barriers noted  Pharmacist Clinical Goal(s):  Patient will maintain control of diabetes as evidenced by A1c less than 8%  through collaboration with PharmD and provider.   Interventions: 1:1 collaboration with [name], MD regarding development and update of comprehensive plan of care as evidenced by provider attestation and co-signature Inter-disciplinary care team collaboration (see longitudinal plan of care) Comprehensive medication review performed; medication list  updated in electronic medical record  Hypertension (BP goal <140/90) -Controlled -Current treatment: Amlodipine 10 mg daily  Losartan 100 mg daily  Metoprolol Tartrate 100 mg twice daily  -Medications previously tried: 135/60s   -Current home readings: 120-130s/70s,  -Denies hypotensive/hypertensive symptoms: feels cold all the time.  -Educated on Daily salt intake goal < 2300 mg; Exercise goal of 150 minutes per week; Importance of home blood pressure monitoring; -Counseled to monitor BP at home weekly, document, and provide log at future appointments -Recommended to continue current medication  Hyperlipidemia: (LDL goal < 100) -Controlled -Current treatment: Atorvastatin 10 mg daily  -Medications previously tried: NA  -Educated on Importance of limiting foods high in cholesterol; -Recommended to continue current medication  Diabetes (A1c goal <7%) -Controlled -Current medications: Glipizide XL 10 mg daily  -Medications previously tried: NA  -Current home glucose readings fasting glucose: 108 post prandial glucose: NA -Denies hypoglycemic/hyperglycemic symptoms -Educated on A1c and blood sugar goals; Complications of diabetes including kidney damage, retinal damage, and cardiovascular disease; Prevention and management of hypoglycemic episodes; -Counseled to check feet daily and get yearly eye exams -Recommended to continue current medication  Atrial Fibrillation (Goal: prevent stroke and major  bleeding) -Controlled -CHADSVASC: 6 -Current treatment: Rate control: Metoprolol Tartrate 100 mg twice daily  Anticoagulation: Eliquis 5 mg twice daily  -Medications previously tried: NA -Counseled on bleeding risk associated with Eliquis and importance of self-monitoring for signs/symptoms of bleeding; avoidance of NSAIDs due to increased bleeding risk with anticoagulants; -Recommended to continue current medication  Osteoarthritis (Goal: Minimize pain  symptoms) -Controlled -Current treatment  Acetaminophen 650 CR two tablets in the morning Tramadol 50 mg twice daily as needed  Voltaren 1% gel twice daily  -Medications previously tried: NA  -Walks with walker, is careful to avoid falls  -Recommended to continue current medication  Acute on Chronic Kidney Injury -All medications assessed for renal dosing and appropriateness in chronic kidney disease. -Counseled on importance of adequate fluid intake, minimizing NSAID use.  -Recommended to continue current medication  Patient Goals/Self-Care Activities Patient will:  - check glucose daily before breakfast, document, and provide at future appointments check blood pressure weekly, document, and provide at future appointments  Follow Up Plan: Telephone follow up appointment with care management team member scheduled for:  04/27/2021 at 11:00 AM    Patient agreed to services and verbal consent obtained.   The patient verbalized understanding of instructions, educational materials, and care plan provided today and declined offer to receive copy of patient instructions, educational materials, and care plan.   [name], PharmD, [place], CPP Clinical Pharmacist [facility] at [facility]  [phone]

## 2020-11-12 ENCOUNTER — Telehealth: Payer: Self-pay | Admitting: Family Medicine

## 2020-11-13 NOTE — Telephone Encounter (Signed)
Called and spoke with pharmacist who states that a written prescription for patient to have shingle vaccine needs to be sent to their store before patient can have shingles vaccine. Please advise.

## 2020-11-14 ENCOUNTER — Other Ambulatory Visit: Payer: Self-pay | Admitting: Family Medicine

## 2020-11-14 DIAGNOSIS — I152 Hypertension secondary to endocrine disorders: Secondary | ICD-10-CM

## 2020-11-14 DIAGNOSIS — M25562 Pain in left knee: Secondary | ICD-10-CM

## 2020-11-14 DIAGNOSIS — E119 Type 2 diabetes mellitus without complications: Secondary | ICD-10-CM

## 2020-11-14 DIAGNOSIS — I482 Chronic atrial fibrillation, unspecified: Secondary | ICD-10-CM | POA: Diagnosis not present

## 2020-11-14 DIAGNOSIS — E1159 Type 2 diabetes mellitus with other circulatory complications: Secondary | ICD-10-CM | POA: Diagnosis not present

## 2020-11-14 DIAGNOSIS — G8929 Other chronic pain: Secondary | ICD-10-CM

## 2020-11-17 ENCOUNTER — Emergency Department (HOSPITAL_BASED_OUTPATIENT_CLINIC_OR_DEPARTMENT_OTHER): Payer: Medicare Other

## 2020-11-17 ENCOUNTER — Emergency Department (HOSPITAL_BASED_OUTPATIENT_CLINIC_OR_DEPARTMENT_OTHER)
Admission: EM | Admit: 2020-11-17 | Discharge: 2020-11-17 | Disposition: A | Discharge status: Home / Self Care | Payer: Medicare Other | Attending: Emergency Medicine | Admitting: Emergency Medicine

## 2020-11-17 ENCOUNTER — Encounter (HOSPITAL_BASED_OUTPATIENT_CLINIC_OR_DEPARTMENT_OTHER): Payer: Self-pay

## 2020-11-17 ENCOUNTER — Other Ambulatory Visit: Payer: Self-pay

## 2020-11-17 DIAGNOSIS — L03116 Cellulitis of left lower limb: Secondary | ICD-10-CM | POA: Diagnosis not present

## 2020-11-17 DIAGNOSIS — N179 Acute kidney failure, unspecified: Secondary | ICD-10-CM | POA: Diagnosis not present

## 2020-11-17 DIAGNOSIS — F17200 Nicotine dependence, unspecified, uncomplicated: Secondary | ICD-10-CM | POA: Diagnosis not present

## 2020-11-17 DIAGNOSIS — Z79899 Other long term (current) drug therapy: Secondary | ICD-10-CM | POA: Insufficient documentation

## 2020-11-17 DIAGNOSIS — I129 Hypertensive chronic kidney disease with stage 1 through stage 4 chronic kidney disease, or unspecified chronic kidney disease: Secondary | ICD-10-CM | POA: Insufficient documentation

## 2020-11-17 DIAGNOSIS — E1122 Type 2 diabetes mellitus with diabetic chronic kidney disease: Secondary | ICD-10-CM | POA: Diagnosis not present

## 2020-11-17 DIAGNOSIS — Z7984 Long term (current) use of oral hypoglycemic drugs: Secondary | ICD-10-CM | POA: Diagnosis not present

## 2020-11-17 DIAGNOSIS — R600 Localized edema: Secondary | ICD-10-CM | POA: Diagnosis not present

## 2020-11-17 DIAGNOSIS — M79605 Pain in left leg: Secondary | ICD-10-CM | POA: Diagnosis present

## 2020-11-17 DIAGNOSIS — Z7901 Long term (current) use of anticoagulants: Secondary | ICD-10-CM | POA: Diagnosis not present

## 2020-11-17 DIAGNOSIS — N184 Chronic kidney disease, stage 4 (severe): Secondary | ICD-10-CM | POA: Diagnosis not present

## 2020-11-17 DIAGNOSIS — M7122 Synovial cyst of popliteal space [Baker], left knee: Secondary | ICD-10-CM | POA: Insufficient documentation

## 2020-11-17 DIAGNOSIS — M7989 Other specified soft tissue disorders: Secondary | ICD-10-CM | POA: Diagnosis not present

## 2020-11-17 LAB — CBC WITH DIFFERENTIAL/PLATELET
Abs Immature Granulocytes: 0.03 10*3/uL (ref 0.00–0.07)
Basophils Absolute: 0 10*3/uL (ref 0.0–0.1)
Basophils Relative: 0 %
Eosinophils Absolute: 0 10*3/uL (ref 0.0–0.5)
Eosinophils Relative: 1 %
HCT: 43.5 % (ref 36.0–46.0)
Hemoglobin: 13.5 g/dL (ref 12.0–15.0)
Immature Granulocytes: 0 %
Lymphocytes Relative: 6 %
Lymphs Abs: 0.5 10*3/uL — ABNORMAL LOW (ref 0.7–4.0)
MCH: 26.6 pg (ref 26.0–34.0)
MCHC: 31 g/dL (ref 30.0–36.0)
MCV: 85.6 fL (ref 80.0–100.0)
Monocytes Absolute: 0.6 10*3/uL (ref 0.1–1.0)
Monocytes Relative: 8 %
Neutro Abs: 6.3 10*3/uL (ref 1.7–7.7)
Neutrophils Relative %: 85 %
Platelets: 95 10*3/uL — ABNORMAL LOW (ref 150–400)
RBC: 5.08 MIL/uL (ref 3.87–5.11)
RDW: 14.7 % (ref 11.5–15.5)
WBC: 7.4 10*3/uL (ref 4.0–10.5)
nRBC: 0 % (ref 0.0–0.2)

## 2020-11-17 LAB — BASIC METABOLIC PANEL
Anion gap: 12 (ref 5–15)
BUN: 21 mg/dL (ref 8–23)
CO2: 24 mmol/L (ref 22–32)
Calcium: 10.9 mg/dL — ABNORMAL HIGH (ref 8.9–10.3)
Chloride: 102 mmol/L (ref 98–111)
Creatinine, Ser: 1.05 mg/dL — ABNORMAL HIGH (ref 0.44–1.00)
GFR, Estimated: 50 mL/min — ABNORMAL LOW (ref >60)
Glucose, Bld: 175 mg/dL — ABNORMAL HIGH (ref 70–99)
Potassium: 3.7 mmol/L (ref 3.5–5.1)
Sodium: 138 mmol/L (ref 135–145)

## 2020-11-17 MED ORDER — CEFAZOLIN SODIUM-DEXTROSE 1-4 GM/50ML-% IV SOLN
1.0000 g | Freq: Once | INTRAVENOUS | Status: AC
  Administered 2020-11-17: 1 g via INTRAVENOUS
  Filled 2020-11-17: qty 50

## 2020-11-17 MED ORDER — SODIUM CHLORIDE 0.9 % IV SOLN: INTRAVENOUS | Status: DC

## 2020-11-17 MED ORDER — ACETAMINOPHEN 500 MG PO TABS
1000.0000 mg | ORAL_TABLET | Freq: Once | ORAL | Status: AC
  Administered 2020-11-17: 1000 mg via ORAL
  Filled 2020-11-17: qty 2

## 2020-11-17 MED ORDER — CEPHALEXIN 500 MG PO CAPS
500.0000 mg | ORAL_CAPSULE | Freq: Four times a day (QID) | ORAL | 0 refills | Status: DC
Start: 2020-11-17 — End: 2020-11-28

## 2020-11-17 NOTE — ED Triage Notes (Signed)
Pt c/o left lower leg pain since yesterday. Redness and warmth noted. Recent DVT in January. Hx of gout. States difficulty ambulating, no recent falls or injury.

## 2020-11-17 NOTE — ED Provider Notes (Signed)
[facility] EMERGENCY DEPARTMENT Provider Note   CSN: [id number] Arrival date & time: 11/17/20  1033     History Chief Complaint  Patient presents with   Leg Pain    Chelsea Dean is a 87 y.o. female.  Pt presents to the ED today with left leg pain, redness, and swelling.  Pt is very HOH and daughter helps with hx.  Sx started yesterday.  Pt is still able to ambulate.  No trauma.  Pt did have a large DVT in Jan.  She had a thrombectomy in Feb.  She is on Eliquis.        Past Medical History:  Diagnosis Date   A-fib ([place]) 10/19/2016   Abdominal aortic aneurysm (AAA) without rupture 09/15/2013   Acute idiopathic gout of right foot    AKI (acute kidney injury) ([place]) 10/18/2016   Anticoagulated on Coumadin    Arthritis    Diabetes mellitus without complication ([place])    Diverticular disease of large intestine 04/16/2013   Diverticulosis    DM2 (diabetes mellitus, type 2) ([place]) 10/19/2016   Dysrhythmia    Atrial fib   History of colon polyps 04/16/2013   Hx of colonic polyps    Hyperlipidemia    Hypertension    Long term current use of anticoagulant therapy 02/05/2015   Lumbar radiculopathy 03/28/2014   Microalbuminuria 04/16/2013   Osteoarthritis 04/16/2013   Osteoarthritis of cervical spine 03/28/2014   Pelvic mass 10/19/2016   Right renal mass    SBO (small bowel obstruction) ([place]) 10/18/2016   Thrombocytopenia ([place]) 04/16/2013   Overview:  referred to hematology 01/2104    Patient Active Problem List   Diagnosis Date Noted   Hyperlipidemia associated with type 2 diabetes mellitus ([place]) 06/06/2020   Ischemic leg 04/01/2020   Stage 4 chronic kidney disease ([place]) 09/21/2019   Elevated LDL cholesterol level 07/19/2018   Need for influenza vaccination 12/28/2017   Chronic pain of both knees 10/19/2017   Stage 3 chronic kidney disease ([place]) 07/25/2017   Hypomagnesemia 03/31/2017   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/31/2017   Abnormal urinalysis 03/16/2017   Hypokalemia 10/31/2016    Lactic acidosis 10/31/2016   Atrial fibrillation with RVR ([place]) 10/31/2016   Elevated liver enzymes 10/31/2016   Anticoagulated on Coumadin 10/31/2016   Elevated INR 10/31/2016   Hyperlipidemia 10/31/2016   Essential hypertension 10/31/2016   Acute idiopathic gout of right foot    A-fib ([place]) 10/19/2016   Type 2 diabetes mellitus without complication, without long-term current use of insulin ([place]) 10/19/2016   Pelvic mass 10/19/2016   SBO (small bowel obstruction) ([place]) 10/18/2016   Long term current use of anticoagulant therapy 02/05/2015   Lumbar radiculopathy 03/28/2014   Osteoarthritis of cervical spine 03/28/2014   Abdominal aortic aneurysm (AAA) without rupture 09/15/2013   Diverticular disease of large intestine 04/16/2013   History of colon polyps 04/16/2013   Microalbuminuria 04/16/2013   Osteoarthritis 04/16/2013   Thrombocytopenia ([place]) 04/16/2013    Past Surgical History:  Procedure Laterality Date   ABDOMINAL AORTIC ANEURYSM REPAIR     ABDOMINAL HYSTERECTOMY     IR ANGIOGRAM EXTREMITY LEFT  04/01/2020   IR ANGIOGRAM PELVIS SELECTIVE OR SUPRASELECTIVE  04/01/2020   IR ANGIOGRAM SELECTIVE EACH ADDITIONAL VESSEL  04/01/2020   IR ANGIOGRAM SELECTIVE EACH ADDITIONAL VESSEL  04/01/2020   IR ANGIOGRAM SELECTIVE EACH ADDITIONAL VESSEL  04/01/2020   IR AORTAGRAM ABDOMINAL SERIALOGRAM  04/01/2020   IR EMBO ARTERIAL NOT HEMORR HEMANG INC GUIDE ROADMAPPING  04/01/2020  IR GENERIC HISTORICAL  07/01/2015   IR RADIOLOGIST EVAL & MGMT 07/01/2015 [facility] INTERV RAD   IR RADIOLOGIST EVAL & MGMT  03/11/2020   IR RADIOLOGIST EVAL & MGMT  04/30/2020   IR THROMBECT PRIM MECH INIT (INCLU) MOD SED  04/01/2020   IR US GUIDE VASC ACCESS LEFT  04/01/2020   IR US GUIDE VASC ACCESS LEFT  04/01/2020   IR US GUIDE VASC ACCESS RIGHT  04/01/2020   THROMBECTOMY FEMORAL ARTERY Left 04/01/2020   Procedure: THROMBECTOMY LEFT POPLITEAL ARTERY;  Surgeon: Rosetta Posner, MD;  Location: [facility] OR;  Service: Vascular;   Laterality: Left;     OB History   No obstetric history on file.     Family History  Problem Relation Age of Onset   Cancer Sister        Rectal and Stomach    Social History   Tobacco Use   Smoking status: Never   Smokeless tobacco: Current    Types: Snuff  Vaping Use   Vaping Use: Never used  Substance Use Topics   Alcohol use: No    Alcohol/week: 0.0 standard drinks   Drug use: No    Home Medications Prior to Admission medications   Medication Sig Start Date End Date Taking? Authorizing Provider  cephALEXin (KEFLEX) 500 MG capsule Take 1 capsule (500 mg total) by mouth 4 (four) times daily. 11/17/20  Yes Isla Pence, MD  acetaminophen (TYLENOL) 650 MG CR tablet Take 650-1,300 mg by mouth every 8 (eight) hours as needed for pain.    [provider]  amLODipine (NORVASC) 10 MG tablet TAKE 1 TABLET BY MOUTH EVERY DAY FOR BLOOD PRESSURE 10/06/20   [name], MD  atorvastatin (LIPITOR) 10 MG tablet TAKE 1 TABLET BY MOUTH EVERY DAY 06/02/20   [name], MD  diclofenac sodium (VOLTAREN) 1 % GEL Apply 2 g topically daily as needed (KNEE PAIN).    [provider]  ELIQUIS 5 MG TABS tablet TAKE 1 TABLET BY MOUTH TWICE DAILY FOR 30 DAYS (02/13/20 TO INDEFINITELY) 10/06/20   [name], MD  glipiZIDE (GLUCOTROL XL) 10 MG 24 hr tablet TAKE 1 TABLET BY MOUTH EVERY DAY WITH BREAKFAST 03/06/20   Dutch Quint B, FNP  Lidocaine (HM LIDOCAINE PATCH) 4 % PTCH Apply 1 patch topically every 12 (twelve) hours. 92/11/94   Delora Fuel, MD  losartan (COZAAR) 100 MG tablet TAKE 1 TABLET BY MOUTH EVERY DAY 10/06/20   [name], MD  metoprolol tartrate (LOPRESSOR) 100 MG tablet TAKE 1 TABLET BY MOUTH 2 TIMES A DAY 05/15/20   [name], MD  traMADol Veatrice Bourbon) 50 MG tablet TAKE 1 TABLET BY MOUTH EVERY 12 HOURS ASNEEDED FOR PAIN 11/14/20   [name], MD    Allergies    Ace inhibitors, Contrast media [iodinated  diagnostic agents], and Nsaids  Review of Systems   Review of Systems  Musculoskeletal:        Left leg pain  All other systems reviewed and are negative.  Physical Exam Updated Vital Signs BP 128/73   Pulse 69   Temp 98.9 F (37.2 C) (Oral)   Resp 20   Ht 5\' 2"  (1.575 m)   Wt 74.8 kg   SpO2 95%   BMI 30.18 kg/m   Physical Exam Vitals and nursing note reviewed.  Constitutional:      Appearance: Normal appearance.  HENT:     Head: Normocephalic and atraumatic.     Right  Ear: External ear normal.     Left Ear: External ear normal.     Nose: Nose normal.     Mouth/Throat:     Mouth: Mucous membranes are moist.     Pharynx: Oropharynx is clear.  Eyes:     Extraocular Movements: Extraocular movements intact.     Conjunctiva/sclera: Conjunctivae normal.     Pupils: Pupils are equal, round, and reactive to light.  Cardiovascular:     Rate and Rhythm: Normal rate and regular rhythm.     Pulses: Normal pulses.     Heart sounds: Normal heart sounds.  Pulmonary:     Effort: Pulmonary effort is normal.     Breath sounds: Normal breath sounds.  Abdominal:     General: Abdomen is flat. Bowel sounds are normal.     Palpations: Abdomen is soft.  Musculoskeletal:     Cervical back: Normal range of motion and neck supple.     Comments: Left leg with redness and swelling.    Skin:    General: Skin is warm.     Capillary Refill: Capillary refill takes less than 2 seconds.  Neurological:     General: No focal deficit present.     Mental Status: She is alert and oriented to person, place, and time.  Psychiatric:        Mood and Affect: Mood normal.        Behavior: Behavior normal.    ED Results / Procedures / Treatments   Labs (all labs ordered are listed, but only abnormal results are displayed) Labs Reviewed  BASIC METABOLIC PANEL - Abnormal; Notable for the following components:      Result Value   Glucose, Bld 175 (*)    Creatinine, Ser 1.05 (*)    Calcium 10.9 (*)     GFR, Estimated 50 (*)    All other components within normal limits  CBC WITH DIFFERENTIAL/PLATELET - Abnormal; Notable for the following components:   Platelets 95 (*)    Lymphs Abs 0.5 (*)    All other components within normal limits    EKG None  Radiology US Venous Img Lower Unilateral Left  Result Date: 11/17/2020 CLINICAL DATA:  Swelling and edema of the limb. EXAM: LEFT LOWER EXTREMITY VENOUS DOPPLER ULTRASOUND TECHNIQUE: Gray-scale sonography with compression, as well as color and duplex ultrasound, were performed to evaluate the deep venous system(s) from the level of the common femoral vein through the popliteal and proximal calf veins. COMPARISON:  None. FINDINGS: VENOUS Normal compressibility of the common femoral, superficial femoral, and popliteal veins, as well as the visualized calf veins. Visualized portions of profunda femoral vein and great saphenous vein unremarkable. No filling defects to suggest DVT on grayscale or color Doppler imaging. Doppler waveforms show normal direction of venous flow, normal respiratory plasticity and response to augmentation. Limited views of the contralateral common femoral vein are unremarkable. OTHER 4.5 x 2.4 x 2.3 cm fluid collection in the upper medial calf which may reflect inferior extension of a Baker's cyst versus a liquified hematoma. Limitations: none IMPRESSION: 1. No evidence of left lower extremity deep venous thrombosis. 2. 4.5 x 2.4 x 2.3 cm fluid collection in the upper medial calf which may reflect inferior extension of a Baker's cyst versus a liquified hematoma. Electronically Signed   By: Kathreen Devoid M.D.   On: 11/17/2020 12:18    Procedures Procedures   Medications Ordered in ED Medications  0.9 %  sodium chloride infusion ( Intravenous New Bag/Given  11/17/20 1154)  ceFAZolin (ANCEF) IVPB 1 g/50 mL premix (1 g Intravenous New Bag/Given 11/17/20 1228)  acetaminophen (TYLENOL) tablet 1,000 mg (1,000 mg Oral Given 11/17/20  1228)    ED Course  I have reviewed the triage vital signs and the nursing notes.  Pertinent labs & imaging results that were available during my care of the patient were reviewed by me and considered in my medical decision making (see chart for details).    MDM Rules/Calculators/A&P                           Pt does not have a DVT.  She may have a hematoma vs baker's cyst.  She definitely has redness and warmth to her left leg.  She is given a dose of ancef in ED and is d/c with keflex.  She is to return if worse.  F/u with pcp. Final Clinical Impression(s) / ED Diagnoses Final diagnoses:  Cellulitis of left lower extremity  Baker's cyst of knee, left    Rx / DC Orders ED Discharge Orders          Ordered    cephALEXin (KEFLEX) 500 MG capsule  4 times daily        11/17/20 1301             Isla Pence, MD 11/17/20 1304

## 2020-11-21 ENCOUNTER — Emergency Department (HOSPITAL_BASED_OUTPATIENT_CLINIC_OR_DEPARTMENT_OTHER)
Admission: EM | Admit: 2020-11-21 | Discharge: 2020-11-21 | Disposition: A | Discharge status: Home / Self Care | Payer: Medicare Other | Source: Home / Self Care | Attending: Emergency Medicine | Admitting: Emergency Medicine

## 2020-11-21 ENCOUNTER — Other Ambulatory Visit: Payer: Self-pay

## 2020-11-21 ENCOUNTER — Encounter (HOSPITAL_BASED_OUTPATIENT_CLINIC_OR_DEPARTMENT_OTHER): Payer: Self-pay

## 2020-11-21 ENCOUNTER — Telehealth: Payer: Self-pay | Admitting: Family Medicine

## 2020-11-21 DIAGNOSIS — L0291 Cutaneous abscess, unspecified: Secondary | ICD-10-CM

## 2020-11-21 DIAGNOSIS — I480 Paroxysmal atrial fibrillation: Secondary | ICD-10-CM | POA: Diagnosis not present

## 2020-11-21 DIAGNOSIS — Z7901 Long term (current) use of anticoagulants: Secondary | ICD-10-CM | POA: Insufficient documentation

## 2020-11-21 DIAGNOSIS — Z79899 Other long term (current) drug therapy: Secondary | ICD-10-CM | POA: Insufficient documentation

## 2020-11-21 DIAGNOSIS — E1169 Type 2 diabetes mellitus with other specified complication: Secondary | ICD-10-CM | POA: Diagnosis not present

## 2020-11-21 DIAGNOSIS — Z91041 Radiographic dye allergy status: Secondary | ICD-10-CM | POA: Diagnosis not present

## 2020-11-21 DIAGNOSIS — L02416 Cutaneous abscess of left lower limb: Secondary | ICD-10-CM | POA: Insufficient documentation

## 2020-11-21 DIAGNOSIS — E785 Hyperlipidemia, unspecified: Secondary | ICD-10-CM | POA: Diagnosis not present

## 2020-11-21 DIAGNOSIS — N184 Chronic kidney disease, stage 4 (severe): Secondary | ICD-10-CM | POA: Insufficient documentation

## 2020-11-21 DIAGNOSIS — Z888 Allergy status to other drugs, medicaments and biological substances status: Secondary | ICD-10-CM | POA: Diagnosis not present

## 2020-11-21 DIAGNOSIS — R600 Localized edema: Secondary | ICD-10-CM | POA: Diagnosis not present

## 2020-11-21 DIAGNOSIS — I129 Hypertensive chronic kidney disease with stage 1 through stage 4 chronic kidney disease, or unspecified chronic kidney disease: Secondary | ICD-10-CM | POA: Insufficient documentation

## 2020-11-21 DIAGNOSIS — M1712 Unilateral primary osteoarthritis, left knee: Secondary | ICD-10-CM | POA: Diagnosis not present

## 2020-11-21 DIAGNOSIS — E1122 Type 2 diabetes mellitus with diabetic chronic kidney disease: Secondary | ICD-10-CM | POA: Insufficient documentation

## 2020-11-21 DIAGNOSIS — Z20822 Contact with and (suspected) exposure to covid-19: Secondary | ICD-10-CM | POA: Diagnosis not present

## 2020-11-21 DIAGNOSIS — Z7984 Long term (current) use of oral hypoglycemic drugs: Secondary | ICD-10-CM | POA: Insufficient documentation

## 2020-11-21 DIAGNOSIS — Z886 Allergy status to analgesic agent status: Secondary | ICD-10-CM | POA: Diagnosis not present

## 2020-11-21 DIAGNOSIS — B9689 Other specified bacterial agents as the cause of diseases classified elsewhere: Secondary | ICD-10-CM | POA: Diagnosis not present

## 2020-11-21 DIAGNOSIS — Z872 Personal history of diseases of the skin and subcutaneous tissue: Secondary | ICD-10-CM | POA: Diagnosis not present

## 2020-11-21 DIAGNOSIS — L02415 Cutaneous abscess of right lower limb: Secondary | ICD-10-CM | POA: Diagnosis not present

## 2020-11-21 DIAGNOSIS — F17200 Nicotine dependence, unspecified, uncomplicated: Secondary | ICD-10-CM | POA: Insufficient documentation

## 2020-11-21 DIAGNOSIS — L03116 Cellulitis of left lower limb: Secondary | ICD-10-CM | POA: Diagnosis not present

## 2020-11-21 DIAGNOSIS — I10 Essential (primary) hypertension: Secondary | ICD-10-CM | POA: Diagnosis not present

## 2020-11-21 DIAGNOSIS — Z720 Tobacco use: Secondary | ICD-10-CM | POA: Diagnosis not present

## 2020-11-21 DIAGNOSIS — M19072 Primary osteoarthritis, left ankle and foot: Secondary | ICD-10-CM | POA: Diagnosis not present

## 2020-11-21 NOTE — ED Triage Notes (Signed)
Per daughter pt dx with cellulitis left LE 10/3-reports leg is worse-NAD-to triage in w/c

## 2020-11-21 NOTE — Telephone Encounter (Signed)
Returned patients daughter call no answer unable to [facility] Will call back.

## 2020-11-21 NOTE — Telephone Encounter (Signed)
Please advise message below patient has an upcoming appointment on 11/24/20.

## 2020-11-21 NOTE — Discharge Instructions (Signed)
Please continue the antibiotics as prescribed. Follow up with your PCP for further evaluation as needed.  Please follow-up if you have worsening pain, redness, purulent drainage.

## 2020-11-21 NOTE — ED Provider Notes (Signed)
[facility] EMERGENCY DEPARTMENT Provider Note   CSN: [id number] Arrival date & time: 11/21/20  1847     History Chief Complaint  Patient presents with   Leg Problem    Chelsea Dean is a 87 y.o. female who was seen on October 3 for cellulitis of the left lower extremity and prescribed Keflex 500 mg 4x daily who now presents for a focal area of redness, pain, swelling at the proximal aspect of the left lower limb.  Fluid collection is in the distribution of a scar from previous surgical removal of blood clot.  Patient is chronically taking Eliquis, has not missed any doses.  All of history was obtained from patient's daughter as patient is severely hearing impaired, normally wears a hearing aid, however did not bring her hearing aids today.  Patient is taking the antibiotic that we prescribed to her on Monday as prescribed.  HPI     Past Medical History:  Diagnosis Date   A-fib ([place]) 10/19/2016   Abdominal aortic aneurysm (AAA) without rupture 09/15/2013   Acute idiopathic gout of right foot    AKI (acute kidney injury) ([place]) 10/18/2016   Anticoagulated on Coumadin    Arthritis    Diabetes mellitus without complication ([place])    Diverticular disease of large intestine 04/16/2013   Diverticulosis    DM2 (diabetes mellitus, type 2) ([place]) 10/19/2016   Dysrhythmia    Atrial fib   History of colon polyps 04/16/2013   Hx of colonic polyps    Hyperlipidemia    Hypertension    Long term current use of anticoagulant therapy 02/05/2015   Lumbar radiculopathy 03/28/2014   Microalbuminuria 04/16/2013   Osteoarthritis 04/16/2013   Osteoarthritis of cervical spine 03/28/2014   Pelvic mass 10/19/2016   Right renal mass    SBO (small bowel obstruction) ([place]) 10/18/2016   Thrombocytopenia ([place]) 04/16/2013   Overview:  referred to hematology 01/2104    Patient Active Problem List   Diagnosis Date Noted   Hyperlipidemia associated with type 2 diabetes mellitus ([place]) 06/06/2020   Ischemic leg  04/01/2020   Stage 4 chronic kidney disease ([place]) 09/21/2019   Elevated LDL cholesterol level 07/19/2018   Need for influenza vaccination 12/28/2017   Chronic pain of both knees 10/19/2017   Stage 3 chronic kidney disease ([place]) 07/25/2017   Hypomagnesemia 03/31/2017   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/31/2017   Abnormal urinalysis 03/16/2017   Hypokalemia 10/31/2016   Lactic acidosis 10/31/2016   Atrial fibrillation with RVR ([place]) 10/31/2016   Elevated liver enzymes 10/31/2016   Anticoagulated on Coumadin 10/31/2016   Elevated INR 10/31/2016   Hyperlipidemia 10/31/2016   Essential hypertension 10/31/2016   Acute idiopathic gout of right foot    A-fib ([place]) 10/19/2016   Type 2 diabetes mellitus without complication, without long-term current use of insulin ([place]) 10/19/2016   Pelvic mass 10/19/2016   SBO (small bowel obstruction) ([place]) 10/18/2016   Long term current use of anticoagulant therapy 02/05/2015   Lumbar radiculopathy 03/28/2014   Osteoarthritis of cervical spine 03/28/2014   Abdominal aortic aneurysm (AAA) without rupture 09/15/2013   Diverticular disease of large intestine 04/16/2013   History of colon polyps 04/16/2013   Microalbuminuria 04/16/2013   Osteoarthritis 04/16/2013   Thrombocytopenia ([place]) 04/16/2013    Past Surgical History:  Procedure Laterality Date   ABDOMINAL AORTIC ANEURYSM REPAIR     ABDOMINAL HYSTERECTOMY     IR ANGIOGRAM EXTREMITY LEFT  04/01/2020   IR ANGIOGRAM PELVIS SELECTIVE OR SUPRASELECTIVE  04/01/2020  IR ANGIOGRAM SELECTIVE EACH ADDITIONAL VESSEL  04/01/2020   IR ANGIOGRAM SELECTIVE EACH ADDITIONAL VESSEL  04/01/2020   IR ANGIOGRAM SELECTIVE EACH ADDITIONAL VESSEL  04/01/2020   IR AORTAGRAM ABDOMINAL SERIALOGRAM  04/01/2020   IR EMBO ARTERIAL NOT HEMORR HEMANG INC GUIDE ROADMAPPING  04/01/2020   IR GENERIC HISTORICAL  07/01/2015   IR RADIOLOGIST EVAL & MGMT 07/01/2015 [facility] INTERV RAD   IR RADIOLOGIST EVAL & MGMT  03/11/2020   IR RADIOLOGIST EVAL &  MGMT  04/30/2020   IR THROMBECT PRIM MECH INIT (INCLU) MOD SED  04/01/2020   IR US GUIDE VASC ACCESS LEFT  04/01/2020   IR US GUIDE VASC ACCESS LEFT  04/01/2020   IR US GUIDE VASC ACCESS RIGHT  04/01/2020   THROMBECTOMY FEMORAL ARTERY Left 04/01/2020   Procedure: THROMBECTOMY LEFT POPLITEAL ARTERY;  Surgeon: Rosetta Posner, MD;  Location: [facility] OR;  Service: Vascular;  Laterality: Left;     OB History   No obstetric history on file.     Family History  Problem Relation Age of Onset   Cancer Sister        Rectal and Stomach    Social History   Tobacco Use   Smoking status: Never   Smokeless tobacco: Current    Types: Snuff  Vaping Use   Vaping Use: Never used  Substance Use Topics   Alcohol use: No    Alcohol/week: 0.0 standard drinks   Drug use: No    Home Medications Prior to Admission medications   Medication Sig Start Date End Date Taking? Authorizing Provider  acetaminophen (TYLENOL) 650 MG CR tablet Take 650-1,300 mg by mouth every 8 (eight) hours as needed for pain.    [provider]  amLODipine (NORVASC) 10 MG tablet TAKE 1 TABLET BY MOUTH EVERY DAY FOR BLOOD PRESSURE 10/06/20   [name], MD  atorvastatin (LIPITOR) 10 MG tablet TAKE 1 TABLET BY MOUTH EVERY DAY 06/02/20   [name], MD  cephALEXin (KEFLEX) 500 MG capsule Take 1 capsule (500 mg total) by mouth 4 (four) times daily. 11/17/20   Isla Pence, MD  diclofenac sodium (VOLTAREN) 1 % GEL Apply 2 g topically daily as needed (KNEE PAIN).    [provider]  ELIQUIS 5 MG TABS tablet TAKE 1 TABLET BY MOUTH TWICE DAILY FOR 30 DAYS (02/13/20 TO INDEFINITELY) 10/06/20   [name], MD  glipiZIDE (GLUCOTROL XL) 10 MG 24 hr tablet TAKE 1 TABLET BY MOUTH EVERY DAY WITH BREAKFAST 03/06/20   Dutch Quint B, FNP  Lidocaine (HM LIDOCAINE PATCH) 4 % PTCH Apply 1 patch topically every 12 (twelve) hours. 01/60/10   Delora Fuel, MD  losartan (COZAAR) 100 MG tablet TAKE 1 TABLET  BY MOUTH EVERY DAY 10/06/20   [name], MD  metoprolol tartrate (LOPRESSOR) 100 MG tablet TAKE 1 TABLET BY MOUTH 2 TIMES A DAY 05/15/20   [name], MD  traMADol Veatrice Bourbon) 50 MG tablet TAKE 1 TABLET BY MOUTH EVERY 12 HOURS ASNEEDED FOR PAIN 11/14/20   [name], MD    Allergies    Ace inhibitors, Contrast media [iodinated diagnostic agents], and Nsaids  Review of Systems   Review of Systems  Reason unable to perform ROS: Hearing aids not on premises.  Skin:  Positive for color change.       Area of swelling, fluctuance   Physical Exam Updated Vital Signs BP 115/62   Pulse 72   Temp (!) 100.7 F (38.2  C) (Oral)   Resp 20   SpO2 92%   Physical Exam Vitals and nursing note reviewed.  Constitutional:      General: She is not in acute distress.    Appearance: Normal appearance.  HENT:     Head: Normocephalic and atraumatic.  Eyes:     General:        Right eye: No discharge.        Left eye: No discharge.  Cardiovascular:     Rate and Rhythm: Normal rate and regular rhythm.     Heart sounds: No murmur heard.   No friction rub. No gallop.  Pulmonary:     Effort: Pulmonary effort is normal.     Breath sounds: Normal breath sounds.  Abdominal:     General: Bowel sounds are normal.     Palpations: Abdomen is soft.  Skin:    General: Skin is warm and dry.     Capillary Refill: Capillary refill takes less than 2 seconds.     Comments: Area of bruising and fluctuance overlying the proximal medial aspect of the left lower leg.  Tenderness to palpation.  Minimal redness, swelling of the rest of the left leg.  Neurological:     Mental Status: She is alert and oriented to person, place, and time.  Psychiatric:        Mood and Affect: Mood normal.        Behavior: Behavior normal.    ED Results / Procedures / Treatments   Labs (all labs ordered are listed, but only abnormal results are displayed) Labs Reviewed - No data to  display  EKG None  Radiology No results found.  Procedures Procedures   Medications Ordered in ED Medications - No data to display  ED Course  I have reviewed the triage vital signs and the nursing notes.  Pertinent labs & imaging results that were available during my care of the patient were reviewed by me and considered in my medical decision making (see chart for details).    MDM Rules/Calculators/A&P                         I discussed this case with my attending physician who cosigned this note including patient's presenting symptoms, physical exam, and planned diagnostics and interventions. Attending physician stated agreement with plan or made changes to plan which were implemented.   Attending physician assessed patient at bedside.  Clinically improving cellulitis based on description of rash on 10/3.  Patient with 1 focal area of fluctuance, redness, tenderness.  Large fluid collection is visualized under ultrasound probe.  Fluid collection was drained with an 18-gauge needle.  10 cc of blood and pus were drained from the affected area.  Puncture wound hole was left open for further drainage.  Patient instructed to continue antibiotics as prescribed.  No signs of sepsis or bacteremia.  Patient does have mild fever.  Encouraged Tylenol for fever as needed.  Recommend follow-up for further evaluation if any increasing redness, swelling, purulent drainage from the affected area.  Patient encouraged to follow-up with primary care for wound check.  Please see procedure note from Dr. Maryan Rued for further information regarding drainage. Final Clinical Impression(s) / ED Diagnoses Final diagnoses:  Abscess    Rx / DC Orders ED Discharge Orders     None        Dorien Chihuahua 11/21/20 2137    Blanchie Dessert, MD 11/21/20 2313

## 2020-11-21 NOTE — ED Provider Notes (Signed)
INCISION AND DRAINAGE Performed by: Blanchie Dessert Consent: Verbal consent obtained. Risks and benefits: risks, benefits and alternatives were discussed Type: abscess  Body area: left calf  Anesthesia: local infiltration  Incision was made with a 18g.  Local anesthetic: lidocaine 2% with epinephrine  Anesthetic total: 4 ml  Complexity: simple Drainage: purulent  Drainage amount: 33mL  Packing material: none Patient tolerance: Patient tolerated the procedure well with no immediate complications.     Blanchie Dessert, MD 11/21/20 [phone]

## 2020-11-24 ENCOUNTER — Inpatient Hospital Stay (HOSPITAL_BASED_OUTPATIENT_CLINIC_OR_DEPARTMENT_OTHER)
Admission: EM | Admit: 2020-11-24 | Discharge: 2020-11-28 | DRG: 603 | Disposition: A | Discharge status: Home Health | Payer: Medicare Other | Attending: Internal Medicine | Admitting: Internal Medicine

## 2020-11-24 ENCOUNTER — Emergency Department (HOSPITAL_BASED_OUTPATIENT_CLINIC_OR_DEPARTMENT_OTHER): Payer: Medicare Other

## 2020-11-24 ENCOUNTER — Encounter: Payer: Self-pay | Admitting: Family Medicine

## 2020-11-24 ENCOUNTER — Encounter (HOSPITAL_BASED_OUTPATIENT_CLINIC_OR_DEPARTMENT_OTHER): Payer: Self-pay | Admitting: *Deleted

## 2020-11-24 ENCOUNTER — Other Ambulatory Visit: Payer: Self-pay

## 2020-11-24 ENCOUNTER — Ambulatory Visit (INDEPENDENT_AMBULATORY_CARE_PROVIDER_SITE_OTHER): Payer: Medicare Other | Admitting: Family Medicine

## 2020-11-24 VITALS — BP 116/68 | HR 85 | Temp 97.1°F | Ht 62.0 in | Wt 165.0 lb

## 2020-11-24 DIAGNOSIS — L02416 Cutaneous abscess of left lower limb: Secondary | ICD-10-CM

## 2020-11-24 DIAGNOSIS — L0291 Cutaneous abscess, unspecified: Secondary | ICD-10-CM | POA: Diagnosis not present

## 2020-11-24 DIAGNOSIS — E785 Hyperlipidemia, unspecified: Secondary | ICD-10-CM | POA: Diagnosis present

## 2020-11-24 DIAGNOSIS — E1169 Type 2 diabetes mellitus with other specified complication: Secondary | ICD-10-CM | POA: Diagnosis present

## 2020-11-24 DIAGNOSIS — Z20822 Contact with and (suspected) exposure to covid-19: Secondary | ICD-10-CM | POA: Diagnosis present

## 2020-11-24 DIAGNOSIS — B9689 Other specified bacterial agents as the cause of diseases classified elsewhere: Secondary | ICD-10-CM | POA: Diagnosis not present

## 2020-11-24 DIAGNOSIS — Z7984 Long term (current) use of oral hypoglycemic drugs: Secondary | ICD-10-CM | POA: Diagnosis not present

## 2020-11-24 DIAGNOSIS — R600 Localized edema: Secondary | ICD-10-CM | POA: Diagnosis not present

## 2020-11-24 DIAGNOSIS — E7800 Pure hypercholesterolemia, unspecified: Secondary | ICD-10-CM | POA: Diagnosis not present

## 2020-11-24 DIAGNOSIS — Z91041 Radiographic dye allergy status: Secondary | ICD-10-CM | POA: Diagnosis not present

## 2020-11-24 DIAGNOSIS — T82868A Thrombosis of vascular prosthetic devices, implants and grafts, initial encounter: Secondary | ICD-10-CM | POA: Diagnosis not present

## 2020-11-24 DIAGNOSIS — Z720 Tobacco use: Secondary | ICD-10-CM | POA: Diagnosis not present

## 2020-11-24 DIAGNOSIS — I4811 Longstanding persistent atrial fibrillation: Secondary | ICD-10-CM | POA: Diagnosis not present

## 2020-11-24 DIAGNOSIS — E119 Type 2 diabetes mellitus without complications: Secondary | ICD-10-CM

## 2020-11-24 DIAGNOSIS — Z888 Allergy status to other drugs, medicaments and biological substances status: Secondary | ICD-10-CM

## 2020-11-24 DIAGNOSIS — I10 Essential (primary) hypertension: Secondary | ICD-10-CM | POA: Diagnosis present

## 2020-11-24 DIAGNOSIS — I4891 Unspecified atrial fibrillation: Secondary | ICD-10-CM | POA: Diagnosis present

## 2020-11-24 DIAGNOSIS — M19072 Primary osteoarthritis, left ankle and foot: Secondary | ICD-10-CM | POA: Diagnosis not present

## 2020-11-24 DIAGNOSIS — I480 Paroxysmal atrial fibrillation: Secondary | ICD-10-CM | POA: Diagnosis not present

## 2020-11-24 DIAGNOSIS — Z7901 Long term (current) use of anticoagulants: Secondary | ICD-10-CM | POA: Diagnosis not present

## 2020-11-24 DIAGNOSIS — Z23 Encounter for immunization: Secondary | ICD-10-CM

## 2020-11-24 DIAGNOSIS — Z79899 Other long term (current) drug therapy: Secondary | ICD-10-CM | POA: Diagnosis not present

## 2020-11-24 DIAGNOSIS — L03116 Cellulitis of left lower limb: Secondary | ICD-10-CM | POA: Diagnosis not present

## 2020-11-24 DIAGNOSIS — Z886 Allergy status to analgesic agent status: Secondary | ICD-10-CM | POA: Diagnosis not present

## 2020-11-24 DIAGNOSIS — Z872 Personal history of diseases of the skin and subcutaneous tissue: Secondary | ICD-10-CM | POA: Diagnosis not present

## 2020-11-24 DIAGNOSIS — M1712 Unilateral primary osteoarthritis, left knee: Secondary | ICD-10-CM | POA: Diagnosis not present

## 2020-11-24 DIAGNOSIS — T8189XA Other complications of procedures, not elsewhere classified, initial encounter: Secondary | ICD-10-CM | POA: Diagnosis not present

## 2020-11-24 DIAGNOSIS — T8142XA Infection following a procedure, deep incisional surgical site, initial encounter: Secondary | ICD-10-CM | POA: Diagnosis not present

## 2020-11-24 LAB — RESP PANEL BY RT-PCR (FLU A&B, COVID) ARPGX2
Influenza A by PCR: NEGATIVE
Influenza B by PCR: NEGATIVE
SARS Coronavirus 2 by RT PCR: NEGATIVE

## 2020-11-24 LAB — COMPREHENSIVE METABOLIC PANEL
ALT: 16 U/L (ref 0–44)
AST: 21 U/L (ref 15–41)
Albumin: 3.6 g/dL (ref 3.5–5.0)
Alkaline Phosphatase: 51 U/L (ref 38–126)
Anion gap: 11 (ref 5–15)
BUN: 14 mg/dL (ref 8–23)
CO2: 25 mmol/L (ref 22–32)
Calcium: 10.2 mg/dL (ref 8.9–10.3)
Chloride: 100 mmol/L (ref 98–111)
Creatinine, Ser: 0.98 mg/dL (ref 0.44–1.00)
GFR, Estimated: 54 mL/min — ABNORMAL LOW (ref >60)
Glucose, Bld: 131 mg/dL — ABNORMAL HIGH (ref 70–99)
Potassium: 3.6 mmol/L (ref 3.5–5.1)
Sodium: 136 mmol/L (ref 135–145)
Total Bilirubin: 0.6 mg/dL (ref 0.3–1.2)
Total Protein: 7.7 g/dL (ref 6.5–8.1)

## 2020-11-24 LAB — CBC WITH DIFFERENTIAL/PLATELET
Abs Immature Granulocytes: 0.09 10*3/uL — ABNORMAL HIGH (ref 0.00–0.07)
Basophils Absolute: 0 10*3/uL (ref 0.0–0.1)
Basophils Relative: 0 %
Eosinophils Absolute: 0.1 10*3/uL (ref 0.0–0.5)
Eosinophils Relative: 1 %
HCT: 37.9 % (ref 36.0–46.0)
Hemoglobin: 11.7 g/dL — ABNORMAL LOW (ref 12.0–15.0)
Immature Granulocytes: 1 %
Lymphocytes Relative: 7 %
Lymphs Abs: 0.6 10*3/uL — ABNORMAL LOW (ref 0.7–4.0)
MCH: 26.4 pg (ref 26.0–34.0)
MCHC: 30.9 g/dL (ref 30.0–36.0)
MCV: 85.4 fL (ref 80.0–100.0)
Monocytes Absolute: 1 10*3/uL (ref 0.1–1.0)
Monocytes Relative: 11 %
Neutro Abs: 7.4 10*3/uL (ref 1.7–7.7)
Neutrophils Relative %: 80 %
Platelets: 177 10*3/uL (ref 150–400)
RBC: 4.44 MIL/uL (ref 3.87–5.11)
RDW: 14.2 % (ref 11.5–15.5)
WBC: 9.2 10*3/uL (ref 4.0–10.5)
nRBC: 0 % (ref 0.0–0.2)

## 2020-11-24 LAB — LACTIC ACID, PLASMA: Lactic Acid, Venous: 1.9 mmol/L (ref 0.5–1.9)

## 2020-11-24 MED ORDER — METOPROLOL TARTRATE 5 MG/5ML IV SOLN: 5.0000 mg | Freq: Four times a day (QID) | INTRAVENOUS | Status: DC | PRN

## 2020-11-24 MED ORDER — VANCOMYCIN HCL IN DEXTROSE 1-5 GM/200ML-% IV SOLN
1000.0000 mg | INTRAVENOUS | Status: DC
  Administered 2020-11-26: 1000 mg via INTRAVENOUS
  Filled 2020-11-24: qty 200

## 2020-11-24 MED ORDER — APIXABAN 5 MG PO TABS
5.0000 mg | ORAL_TABLET | Freq: Two times a day (BID) | ORAL | Status: DC
  Administered 2020-11-25: 5 mg via ORAL
  Filled 2020-11-24: qty 1

## 2020-11-24 MED ORDER — INSULIN ASPART 100 UNIT/ML IJ SOLN
0.0000 [IU] | Freq: Three times a day (TID) | INTRAMUSCULAR | Status: DC
  Administered 2020-11-26: 3 [IU] via SUBCUTANEOUS
  Administered 2020-11-27 (×2): 2 [IU] via SUBCUTANEOUS
  Administered 2020-11-27: 3 [IU] via SUBCUTANEOUS
  Administered 2020-11-28: 2 [IU] via SUBCUTANEOUS

## 2020-11-24 MED ORDER — INSULIN ASPART 100 UNIT/ML IJ SOLN
0.0000 [IU] | Freq: Every day | INTRAMUSCULAR | Status: DC
  Administered 2020-11-26: 2 [IU] via SUBCUTANEOUS

## 2020-11-24 MED ORDER — VANCOMYCIN HCL IN DEXTROSE 1-5 GM/200ML-% IV SOLN
1000.0000 mg | Freq: Once | INTRAVENOUS | Status: AC
  Administered 2020-11-24: 1000 mg via INTRAVENOUS
  Filled 2020-11-24: qty 200

## 2020-11-24 MED ORDER — PIPERACILLIN-TAZOBACTAM 3.375 G IVPB
3.3750 g | Freq: Three times a day (TID) | INTRAVENOUS | Status: DC
  Administered 2020-11-25 – 2020-11-28 (×10): 3.375 g via INTRAVENOUS
  Filled 2020-11-24 (×11): qty 50

## 2020-11-24 MED ORDER — SODIUM CHLORIDE 0.9 % IV SOLN: INTRAVENOUS | Status: DC | PRN

## 2020-11-24 MED ORDER — ONDANSETRON HCL 4 MG PO TABS: 4.0000 mg | ORAL_TABLET | Freq: Four times a day (QID) | ORAL | Status: DC | PRN

## 2020-11-24 MED ORDER — PIPERACILLIN-TAZOBACTAM 3.375 G IVPB 30 MIN
3.3750 g | Freq: Once | INTRAVENOUS | Status: AC
  Administered 2020-11-24: 3.375 g via INTRAVENOUS
  Filled 2020-11-24: qty 50

## 2020-11-24 MED ORDER — ACETAMINOPHEN 650 MG RE SUPP: 650.0000 mg | Freq: Four times a day (QID) | RECTAL | Status: DC | PRN

## 2020-11-24 MED ORDER — ENOXAPARIN SODIUM 40 MG/0.4ML IJ SOSY
40.0000 mg | PREFILLED_SYRINGE | INTRAMUSCULAR | Status: DC
Start: 2020-11-25 — End: 2020-11-24

## 2020-11-24 MED ORDER — LACTATED RINGERS IV SOLN: INTRAVENOUS | Status: DC

## 2020-11-24 MED ORDER — ACETAMINOPHEN 325 MG PO TABS
650.0000 mg | ORAL_TABLET | Freq: Four times a day (QID) | ORAL | Status: DC | PRN
  Administered 2020-11-25: 650 mg via ORAL
  Filled 2020-11-24: qty 2

## 2020-11-24 MED ORDER — ONDANSETRON HCL 4 MG/2ML IJ SOLN: 4.0000 mg | Freq: Four times a day (QID) | INTRAMUSCULAR | Status: DC | PRN

## 2020-11-24 MED ORDER — HYDROMORPHONE HCL 1 MG/ML IJ SOLN
0.5000 mg | INTRAMUSCULAR | Status: DC | PRN
  Administered 2020-11-28: 0.5 mg via INTRAVENOUS
  Filled 2020-11-24: qty 1

## 2020-11-24 NOTE — ED Provider Notes (Addendum)
[facility] EMERGENCY DEPARTMENT Provider Note   CSN: [id number] Arrival date & time: 11/24/20  1308     History Chief Complaint  Patient presents with   Abscess    Chelsea Dean is a 87 y.o. female.  HPI Patient is a 87 year old female with a medical history as noted below who presents to the emergency department for reevaluation of left leg cellulitis and abscess.  Patient initially diagnosed with cellulitis in the left lower extremity on October 3.  She was started on Keflex which she had been compliant with.  She returned to the emergency department on October 7 and was diagnosed with a new onset abscess along the left calf overlying a previous surgical scar from a thrombectomy.  I&D was performed producing about 20 mL of purulent discharge.  She was discharged in stable condition and patient continue taking her Keflex.  She followed up with her PCP today and due to her worsening symptoms once again was instructed to come to the emergency department for IV antibiotics as well as possible surgical I&D.  Her daughter is at bedside and states that after the I&D her symptoms improved mildly but then began worsening once again.  Patient reports pain and swelling throughout the left lower leg beginning around the left calf and moving distally.  Patient reports associated decreased appetite as well as difficulty sleeping.  No fevers or vomiting.    Past Medical History:  Diagnosis Date   A-fib ([place]) 10/19/2016   Abdominal aortic aneurysm (AAA) without rupture 09/15/2013   Acute idiopathic gout of right foot    AKI (acute kidney injury) ([place]) 10/18/2016   Anticoagulated on Coumadin    Arthritis    Diabetes mellitus without complication ([place])    Diverticular disease of large intestine 04/16/2013   Diverticulosis    DM2 (diabetes mellitus, type 2) ([place]) 10/19/2016   Dysrhythmia    Atrial fib   History of colon polyps 04/16/2013   Hx of colonic polyps    Hyperlipidemia    Hypertension     Long term current use of anticoagulant therapy 02/05/2015   Lumbar radiculopathy 03/28/2014   Microalbuminuria 04/16/2013   Osteoarthritis 04/16/2013   Osteoarthritis of cervical spine 03/28/2014   Pelvic mass 10/19/2016   Right renal mass    SBO (small bowel obstruction) ([place]) 10/18/2016   Thrombocytopenia ([place]) 04/16/2013   Overview:  referred to hematology 01/2104    Patient Active Problem List   Diagnosis Date Noted   Abscess of left leg 11/24/2020   Hyperlipidemia associated with type 2 diabetes mellitus ([place]) 06/06/2020   Ischemic leg 04/01/2020   Stage 4 chronic kidney disease ([place]) 09/21/2019   Elevated LDL cholesterol level 07/19/2018   Need for influenza vaccination 12/28/2017   Chronic pain of both knees 10/19/2017   Stage 3 chronic kidney disease ([place]) 07/25/2017   Hypomagnesemia 03/31/2017   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/31/2017   Abnormal urinalysis 03/16/2017   Hypokalemia 10/31/2016   Lactic acidosis 10/31/2016   Atrial fibrillation with RVR ([place]) 10/31/2016   Elevated liver enzymes 10/31/2016   Anticoagulated on Coumadin 10/31/2016   Elevated INR 10/31/2016   Hyperlipidemia 10/31/2016   Essential hypertension 10/31/2016   Acute idiopathic gout of right foot    A-fib ([place]) 10/19/2016   Type 2 diabetes mellitus without complication, without long-term current use of insulin ([place]) 10/19/2016   Pelvic mass 10/19/2016   SBO (small bowel obstruction) ([place]) 10/18/2016   Long term current use of anticoagulant therapy 02/05/2015   Lumbar  radiculopathy 03/28/2014   Osteoarthritis of cervical spine 03/28/2014   Abdominal aortic aneurysm (AAA) without rupture 09/15/2013   Diverticular disease of large intestine 04/16/2013   History of colon polyps 04/16/2013   Microalbuminuria 04/16/2013   Osteoarthritis 04/16/2013   Thrombocytopenia ([place]) 04/16/2013    Past Surgical History:  Procedure Laterality Date   ABDOMINAL AORTIC ANEURYSM REPAIR     ABDOMINAL HYSTERECTOMY     IR  ANGIOGRAM EXTREMITY LEFT  04/01/2020   IR ANGIOGRAM PELVIS SELECTIVE OR SUPRASELECTIVE  04/01/2020   IR ANGIOGRAM SELECTIVE EACH ADDITIONAL VESSEL  04/01/2020   IR ANGIOGRAM SELECTIVE EACH ADDITIONAL VESSEL  04/01/2020   IR ANGIOGRAM SELECTIVE EACH ADDITIONAL VESSEL  04/01/2020   IR AORTAGRAM ABDOMINAL SERIALOGRAM  04/01/2020   IR EMBO ARTERIAL NOT HEMORR HEMANG INC GUIDE ROADMAPPING  04/01/2020   IR GENERIC HISTORICAL  07/01/2015   IR RADIOLOGIST EVAL & MGMT 07/01/2015 [facility] INTERV RAD   IR RADIOLOGIST EVAL & MGMT  03/11/2020   IR RADIOLOGIST EVAL & MGMT  04/30/2020   IR THROMBECT PRIM MECH INIT (INCLU) MOD SED  04/01/2020   IR US GUIDE VASC ACCESS LEFT  04/01/2020   IR US GUIDE VASC ACCESS LEFT  04/01/2020   IR US GUIDE VASC ACCESS RIGHT  04/01/2020   THROMBECTOMY FEMORAL ARTERY Left 04/01/2020   Procedure: THROMBECTOMY LEFT POPLITEAL ARTERY;  Surgeon: Rosetta Posner, MD;  Location: [facility] OR;  Service: Vascular;  Laterality: Left;     OB History   No obstetric history on file.     Family History  Problem Relation Age of Onset   Cancer Sister        Rectal and Stomach    Social History   Tobacco Use   Smoking status: Never   Smokeless tobacco: Current    Types: Snuff  Vaping Use   Vaping Use: Never used  Substance Use Topics   Alcohol use: No    Alcohol/week: 0.0 standard drinks   Drug use: No    Home Medications Prior to Admission medications   Medication Sig Start Date End Date Taking? Authorizing Provider  acetaminophen (TYLENOL) 650 MG CR tablet Take 650-1,300 mg by mouth every 8 (eight) hours as needed for pain. Patient not taking: Reported on 11/24/2020    [provider]  amLODipine (NORVASC) 10 MG tablet TAKE 1 TABLET BY MOUTH EVERY DAY FOR BLOOD PRESSURE 10/06/20   [name], MD  atorvastatin (LIPITOR) 10 MG tablet TAKE 1 TABLET BY MOUTH EVERY DAY 06/02/20   [name], MD  cephALEXin (KEFLEX) 500 MG capsule Take 1 capsule (500 mg total) by  mouth 4 (four) times daily. 11/17/20   Isla Pence, MD  diclofenac sodium (VOLTAREN) 1 % GEL Apply 2 g topically daily as needed (KNEE PAIN).    [provider]  ELIQUIS 5 MG TABS tablet TAKE 1 TABLET BY MOUTH TWICE DAILY FOR 30 DAYS (02/13/20 TO INDEFINITELY) 10/06/20   [name], MD  glipiZIDE (GLUCOTROL XL) 10 MG 24 hr tablet TAKE 1 TABLET BY MOUTH EVERY DAY WITH BREAKFAST 03/06/20   Dutch Quint B, FNP  Lidocaine (HM LIDOCAINE PATCH) 4 % PTCH Apply 1 patch topically every 12 (twelve) hours. 54/27/06   Delora Fuel, MD  losartan (COZAAR) 100 MG tablet TAKE 1 TABLET BY MOUTH EVERY DAY 10/06/20   [name], MD  metoprolol tartrate (LOPRESSOR) 100 MG tablet TAKE 1 TABLET BY MOUTH 2 TIMES A DAY 05/15/20   [name], MD  traMADol (ULTRAM) 50 MG tablet TAKE 1 TABLET BY MOUTH EVERY 12 HOURS ASNEEDED FOR PAIN 11/14/20   [name], MD    Allergies    Ace inhibitors, Contrast media [iodinated diagnostic agents], and Nsaids  Review of Systems   Review of Systems  All other systems reviewed and are negative. Ten systems reviewed and are negative for acute change, except as noted in the HPI.   Physical Exam Updated Vital Signs BP 118/65   Pulse 92   Temp 99.3 F (37.4 C) (Oral)   Resp 16   Ht 5\' 2"  (1.575 m)   Wt 74.8 kg   SpO2 91%   BMI 30.18 kg/m   Physical Exam Vitals and nursing note reviewed.  Constitutional:      General: She is not in acute distress.    Appearance: Normal appearance. She is not ill-appearing, toxic-appearing or diaphoretic.  HENT:     Head: Normocephalic and atraumatic.     Right Ear: External ear normal.     Left Ear: External ear normal.     Nose: Nose normal.     Mouth/Throat:     Mouth: Mucous membranes are moist.     Pharynx: Oropharynx is clear. No oropharyngeal exudate or posterior oropharyngeal erythema.  Eyes:     General: No scleral icterus.       Right eye: No discharge.        Left eye:  No discharge.     Extraocular Movements: Extraocular movements intact.     Conjunctiva/sclera: Conjunctivae normal.  Cardiovascular:     Rate and Rhythm: Normal rate and regular rhythm.     Pulses: Normal pulses.     Heart sounds: Normal heart sounds. No murmur heard.   No friction rub. No gallop.  Pulmonary:     Effort: Pulmonary effort is normal. No respiratory distress.     Breath sounds: Normal breath sounds. No stridor. No wheezing, rhonchi or rales.  Abdominal:     General: Abdomen is flat.     Palpations: Abdomen is soft.     Tenderness: There is no abdominal tenderness.  Musculoskeletal:        General: Normal range of motion.     Cervical back: Normal range of motion and neck supple. No tenderness.     Comments: Please see images below of the LLE.  Skin:    General: Skin is warm and dry.  Neurological:     General: No focal deficit present.     Mental Status: She is alert and oriented to person, place, and time.  Psychiatric:        Mood and Affect: Mood normal.        Behavior: Behavior normal.        ED Results / Procedures / Treatments   Labs (all labs ordered are listed, but only abnormal results are displayed) Labs Reviewed  CBC WITH DIFFERENTIAL/PLATELET - Abnormal; Notable for the following components:      Result Value   Hemoglobin 11.7 (*)    Lymphs Abs 0.6 (*)    Abs Immature Granulocytes 0.09 (*)    All other components within normal limits  COMPREHENSIVE METABOLIC PANEL - Abnormal; Notable for the following components:   Glucose, Bld 131 (*)    GFR, Estimated 54 (*)    All other components within normal limits  RESP PANEL BY RT-PCR (FLU A&B, COVID) ARPGX2  LACTIC ACID, PLASMA   EKG None  Radiology DG Tibia/Fibula Left  Result Date:  11/24/2020 CLINICAL DATA:  Open wound medial calf and lower posterior calf EXAM: LEFT TIBIA AND FIBULA - 2 VIEW COMPARISON:  02/08/2020 FINDINGS: Frontal and lateral views of the left tibia and fibula are  obtained. There is extensive soft tissue edema throughout the entire visualized left lower leg. No evidence of subcutaneous emphysema. There are no acute or destructive bony lesions. Stable osteoarthritis of the left knee and ankle. IMPRESSION: 1. Diffuse soft tissue edema.  No evidence of subcutaneous gas. 2. No acute or destructive bony lesions. 3. Stable osteoarthritis. Electronically Signed   By: Randa Ngo M.D.   On: 11/24/2020 15:14   CT Tibia Fibula Left Wo Contrast  Result Date: 11/24/2020 CLINICAL DATA:  History of left lower leg abscess status post incision and drainage on 11/21/2020 EXAM: CT OF THE LOWER LEFT EXTREMITY WITHOUT CONTRAST TECHNIQUE: Multidetector CT imaging of the lower left extremity was performed according to the standard protocol. COMPARISON:  Ultrasound 11/17/2020.  X-ray 11/24/2020 FINDINGS: Bones/Joint/Cartilage No acute fracture. No dislocation. No bony erosion or periosteal elevation. Severe tricompartmental osteoarthritis of the left knee. Mild degenerative changes at the left ankle. No lytic or sclerotic bone lesion. Ligaments Suboptimally assessed by CT. Muscles and Tendons No acute musculotendinous abnormality by CT. No intramuscular fluid collection. Soft tissues Complex collection within the subcutaneous soft tissues along the medial aspect of the lower leg at the level of the proximal tibial diaphysis measuring approximately 6.0 x 2.9 x 3.1 cm (series 8, image 122). Density of this collection is greater than than simple fluid. There is stranding within the adjacent subcutaneous fat. Collection abuts the overlying skin surface with area of probable prior incision along its inferior margin. Overlying skin thickening. No gas within the collection. There is subcutaneous edema throughout the lower leg, more pronounced distally. No soft tissue gas. Prominent atherosclerotic vascular calcifications. IMPRESSION: 1. Complex collection within the subcutaneous soft tissues along  the medial aspect of the left lower leg measuring up to 6.0 cm, most likely representing residual inflammatory collection given history of recent abscess drainage. 2. No acute osseous abnormality. No bony erosion or periosteal elevation to suggest osteomyelitis. 3. Diffuse subcutaneous edema throughout the lower leg, which may reflect cellulitis versus dependent change, although findings appear asymmetric compared with the included portion of the contralateral right lower extremity. No soft tissue gas. 4. Severe tricompartmental osteoarthritis of the left knee. Electronically Signed   By: Davina Poke D.O.   On: 11/24/2020 16:32    Procedures Procedures   Medications Ordered in ED Medications - No data to display  ED Course  I have reviewed the triage vital signs and the nursing notes.  Pertinent labs & imaging results that were available during my care of the patient were reviewed by me and considered in my medical decision making (see chart for details).    MDM Rules/Calculators/A&P                          Pt is a 87 y.o. female who presents to the emergency department with recurring abscess and worsening cellulitis in the left lower extremity.  Patient started on Keflex 1 week ago and her symptoms have continued to worsen.  She was evaluated by her PCP earlier today and instructed to come to the emergency department for IV antibiotics as well as possible surgical consult.  Labs: CBC with a hemoglobin of 11.7, lymphocytes of 0.6, absolute immature granulocytes of 0.09. CMP with a glucose of 131  and a GFR 54. Lactic acid 1.9. Respiratory panel is negative.  Imaging: X-ray of the left tibia/fibula shows diffuse soft tissue edema.  No evidence of subcutaneous gas.  No acute or destructive bony lesions.  Stable osteoarthritis. CT scan of the tibia-fibula left without contrast shows IMPRESSION: 1. Complex collection within the subcutaneous soft tissues along the medial aspect of the left  lower leg measuring up to 6.0 cm, most likely representing residual inflammatory collection given history of recent abscess drainage. 2. No acute osseous abnormality. No bony erosion or periosteal elevation to suggest osteomyelitis. 3. Diffuse subcutaneous edema throughout the lower leg, which may reflect cellulitis versus dependent change, although findings appear asymmetric compared with the included portion of the contralateral right lower extremity. No soft tissue gas. 4. Severe tricompartmental osteoarthritis of the left knee.   I, Rayna Sexton, PA-C, personally reviewed and evaluated these images and lab results as part of my medical decision-making.  Please see images above regarding the left lower extremity.  Patient initially seen on October 3 and started on Keflex for cellulitis in the left lower extremity.  Patient then began developing an abscess to the left calf and this was incised 3 days ago and patient continued taking Keflex.  Her symptoms improved briefly and then began worsening once again.  CBC without leukocytosis.  Low-grade temperature at 99.3 F.  Patient not tachycardic.  Does not appear septic at this time.  Given patient's treatment failure, age, and comorbidities, feel that she will require admission for further management.  Patient started on vancomycin as well as Zosyn here in the emergency department.  We will discuss with the medicine team.  Patient discussed with [facility] hospitalist.  They will accept the patient at this time will be transferred to [facility].  They requested that I discuss the patient with general surgery.  Patient discussed with Dr. Ninfa Linden who is on-call with general surgery who states they will evaluate the patient tomorrow.  Note: Portions of this report may have been transcribed using voice recognition software. Every effort was made to ensure accuracy; however, inadvertent computerized transcription errors may be present.   Final Clinical  Impression(s) / ED Diagnoses Final diagnoses:  Cellulitis and abscess of left lower extremity   Rx / DC Orders ED Discharge Orders     None        Rayna Sexton, PA-C 11/24/20 1702    Rayna Sexton, PA-C 11/24/20 1853    Luna Fuse, MD 11/24/20 2148

## 2020-11-24 NOTE — H&P (Signed)
History and Physical   Chelsea Dean YIR:[id number] DOB: Jun 19, 1927 DOA: 11/24/2020  Referring MD/NP/PA:Dr Almyra Free, ER  PCP: [name], MD   Outpatient Specialists: None  Patient coming from: [place]  Chief Complaint: Left leg wound  HPI: Chelsea Dean is a 87 y.o. female with medical history significant of diabetes, hypertension, atrial fibrillation, history of colon polyps, hyperlipidemia, diverticular disease, abdominal aortic aneurysm who was seen at the ER on the seventh with left foot abscess.  She has been dealing with that for a while.  First diagnosis was October 3 at that point she was placed on Keflex for cellulitis.  Continued to get worse and return on the seventh where she had incision and drainage of copious purulent discharge noted about 20 mL.  This was sent out for cultures.  Patient was asked to continue taking the Keflex again but has continued to get worse returning to the ER today with more induration in the area no obvious drainage but more pain more swelling and redness.  Patient seen and evaluated.  She is being admitted to the hospital with worsening wound infection and cellulitis of the left lower extremity.  She has pain at 4 out of 10 otherwise no other complaints.  No other site of infection.  CT showed no bony involvement.  She was first seen by her primary care physician who referred her to the ER for admission and possible IV antibiotics.  ED Course: Temperature 99.8, blood pressure 145/73, pulse 92 respiratory rate of 30 oxygen sat 91% on room air.  Chemistry largely within normal lactic acid 1.9 CBC also largely within normal.  COVID-19 and influenza screen negative.  CT of the lower extremity showed complex collection within the subcutaneous soft tissues along the medial aspect of the left lower leg it measures about 6 cm no bony involvement but evidence of cellulitis.  Patient being admitted for IV antibiotics.  Review of Systems: As  per HPI otherwise 10 point review of systems negative.    Past Medical History:  Diagnosis Date   A-fib ([place]) 10/19/2016   Abdominal aortic aneurysm (AAA) without rupture 09/15/2013   Acute idiopathic gout of right foot    AKI (acute kidney injury) ([place]) 10/18/2016   Anticoagulated on Coumadin    Arthritis    Diabetes mellitus without complication ([place])    Diverticular disease of large intestine 04/16/2013   Diverticulosis    DM2 (diabetes mellitus, type 2) ([place]) 10/19/2016   Dysrhythmia    Atrial fib   History of colon polyps 04/16/2013   Hx of colonic polyps    Hyperlipidemia    Hypertension    Long term current use of anticoagulant therapy 02/05/2015   Lumbar radiculopathy 03/28/2014   Microalbuminuria 04/16/2013   Osteoarthritis 04/16/2013   Osteoarthritis of cervical spine 03/28/2014   Pelvic mass 10/19/2016   Right renal mass    SBO (small bowel obstruction) ([place]) 10/18/2016   Thrombocytopenia ([place]) 04/16/2013   Overview:  referred to hematology 01/2104    Past Surgical History:  Procedure Laterality Date   ABDOMINAL AORTIC ANEURYSM REPAIR     ABDOMINAL HYSTERECTOMY     IR ANGIOGRAM EXTREMITY LEFT  04/01/2020   IR ANGIOGRAM PELVIS SELECTIVE OR SUPRASELECTIVE  04/01/2020   IR ANGIOGRAM SELECTIVE EACH ADDITIONAL VESSEL  04/01/2020   IR ANGIOGRAM SELECTIVE EACH ADDITIONAL VESSEL  04/01/2020   IR ANGIOGRAM SELECTIVE EACH ADDITIONAL VESSEL  04/01/2020   IR AORTAGRAM ABDOMINAL SERIALOGRAM  04/01/2020   IR EMBO ARTERIAL  NOT HEMORR HEMANG INC GUIDE ROADMAPPING  04/01/2020   IR GENERIC HISTORICAL  07/01/2015   IR RADIOLOGIST EVAL & MGMT 07/01/2015 [facility] INTERV RAD   IR RADIOLOGIST EVAL & MGMT  03/11/2020   IR RADIOLOGIST EVAL & MGMT  04/30/2020   IR THROMBECT PRIM MECH INIT (INCLU) MOD SED  04/01/2020   IR US GUIDE VASC ACCESS LEFT  04/01/2020   IR US GUIDE VASC ACCESS LEFT  04/01/2020   IR US GUIDE VASC ACCESS RIGHT  04/01/2020   THROMBECTOMY FEMORAL ARTERY Left 04/01/2020   Procedure: THROMBECTOMY LEFT  POPLITEAL ARTERY;  Surgeon: Rosetta Posner, MD;  Location: [facility] OR;  Service: Vascular;  Laterality: Left;     reports that she has never smoked. Her smokeless tobacco use includes snuff. She reports that she does not drink alcohol and does not use drugs.  Allergies  Allergen Reactions   Ace Inhibitors Shortness Of Breath and Rash   Contrast Media [Iodinated Diagnostic Agents] Shortness Of Breath and Rash   Nsaids     Unknown reaction    Family History  Problem Relation Age of Onset   Cancer Sister        Rectal and Stomach     Prior to Admission medications   Medication Sig Start Date End Date Taking? Authorizing Provider  acetaminophen (TYLENOL) 650 MG CR tablet Take 650-1,300 mg by mouth every 8 (eight) hours as needed for pain. Patient not taking: No sig reported    [provider]  amLODipine (NORVASC) 10 MG tablet TAKE 1 TABLET BY MOUTH EVERY DAY FOR BLOOD PRESSURE 10/06/20   [name], MD  atorvastatin (LIPITOR) 10 MG tablet TAKE 1 TABLET BY MOUTH EVERY DAY 06/02/20   [name], MD  cephALEXin (KEFLEX) 500 MG capsule Take 1 capsule (500 mg total) by mouth 4 (four) times daily. 11/17/20   Isla Pence, MD  diclofenac sodium (VOLTAREN) 1 % GEL Apply 2 g topically daily as needed (KNEE PAIN).    [provider]  ELIQUIS 5 MG TABS tablet TAKE 1 TABLET BY MOUTH TWICE DAILY FOR 30 DAYS (02/13/20 TO INDEFINITELY) 10/06/20   [name], MD  glipiZIDE (GLUCOTROL XL) 10 MG 24 hr tablet TAKE 1 TABLET BY MOUTH EVERY DAY WITH BREAKFAST 03/06/20   Dutch Quint B, FNP  Lidocaine (HM LIDOCAINE PATCH) 4 % PTCH Apply 1 patch topically every 12 (twelve) hours. 81/44/81   Delora Fuel, MD  losartan (COZAAR) 100 MG tablet TAKE 1 TABLET BY MOUTH EVERY DAY 10/06/20   [name], MD  metoprolol tartrate (LOPRESSOR) 100 MG tablet TAKE 1 TABLET BY MOUTH 2 TIMES A DAY 05/15/20   [name], MD  traMADol Veatrice Bourbon) 50 MG tablet TAKE  1 TABLET BY MOUTH EVERY 12 HOURS ASNEEDED FOR PAIN 11/14/20   [name], MD    Physical Exam: Vitals:   11/24/20 2000 11/24/20 2100 11/24/20 2200 11/24/20 2306  BP:  127/70 127/66 129/64  Pulse: 84  83 82  Resp: (!) 27 (!) 24 (!) 30 20  Temp:    99.8 F (37.7 C)  TempSrc:      SpO2: 95% 95% 96% 96%  Weight:      Height:          Constitutional: Acutely ill looking, hard of hearing, no distress Vitals:   11/24/20 2000 11/24/20 2100 11/24/20 2200 11/24/20 2306  BP:  127/70 127/66 129/64  Pulse: 84  83 82  Resp: (!) 27 (!) 24 Marland Kitchen)  30 20  Temp:    99.8 F (37.7 C)  TempSrc:      SpO2: 95% 95% 96% 96%  Weight:      Height:       Eyes: PERRL, lids and conjunctivae normal ENMT: Mucous membranes are moist. Posterior pharynx clear of any exudate or lesions.Normal dentition.  Neck: normal, supple, no masses, no thyromegaly Respiratory: clear to auscultation bilaterally, no wheezing, no crackles. Normal respiratory effort. No accessory muscle use.  Cardiovascular: Irregularly irregular rate and rhythm, no murmurs / rubs / gallops. No extremity edema. 2+ pedal pulses. No carotid bruits.  Abdomen: no tenderness, no masses palpated. No hepatosplenomegaly. Bowel sounds positive.  Musculoskeletal: no clubbing / cyanosis. No joint deformity upper and lower extremities. Good ROM, no contractures. Normal muscle tone.  Skin: A large area of induration on the medial aspect of the calf, medium incision about 4 cm deep with induration on both sides,  Neurologic: CN 2-12 grossly intact. Sensation intact, DTR normal. Strength 5/5 in all 4.  Psychiatric: Normal judgment and insight. Alert and oriented x 3. Normal mood.     Labs on Admission: I have personally reviewed following labs and imaging studies  CBC: Recent Labs  Lab 11/24/20 1329  WBC 9.2  NEUTROABS 7.4  HGB 11.7*  HCT 37.9  MCV 85.4  PLT 989   Basic Metabolic Panel: Recent Labs  Lab 11/24/20 1329  NA 136  K  3.6  CL 100  CO2 25  GLUCOSE 131*  BUN 14  CREATININE 0.98  CALCIUM 10.2   GFR: Estimated Creatinine Clearance: 35.4 mL/min (by C-G formula based on SCr of 0.98 mg/dL). Liver Function Tests: Recent Labs  Lab 11/24/20 1329  AST 21  ALT 16  ALKPHOS 51  BILITOT 0.6  PROT 7.7  ALBUMIN 3.6   No results for input(s): LIPASE, AMYLASE in the last 168 hours. No results for input(s): AMMONIA in the last 168 hours. Coagulation Profile: No results for input(s): INR, PROTIME in the last 168 hours. Cardiac Enzymes: No results for input(s): CKTOTAL, CKMB, CKMBINDEX, TROPONINI in the last 168 hours. BNP (last 3 results) No results for input(s): PROBNP in the last 8760 hours. HbA1C: No results for input(s): HGBA1C in the last 72 hours. CBG: No results for input(s): GLUCAP in the last 168 hours. Lipid Profile: No results for input(s): CHOL, HDL, LDLCALC, TRIG, CHOLHDL, LDLDIRECT in the last 72 hours. Thyroid Function Tests: No results for input(s): TSH, T4TOTAL, FREET4, T3FREE, THYROIDAB in the last 72 hours. Anemia Panel: No results for input(s): VITAMINB12, FOLATE, FERRITIN, TIBC, IRON, RETICCTPCT in the last 72 hours. Urine analysis:    Component Value Date/Time   COLORURINE YELLOW 06/06/2020 1021   APPEARANCEUR CLEAR 06/06/2020 1021   LABSPEC 1.020 06/06/2020 1021   PHURINE 5.0 06/06/2020 1021   GLUCOSEU NEGATIVE 06/06/2020 1021   HGBUR TRACE-INTACT (A) 06/06/2020 1021   BILIRUBINUR NEGATIVE 06/06/2020 1021   KETONESUR NEGATIVE 06/06/2020 1021   PROTEINUR 100 (A) 04/01/2018 1754   UROBILINOGEN 0.2 06/06/2020 1021   NITRITE NEGATIVE 06/06/2020 1021   LEUKOCYTESUR NEGATIVE 06/06/2020 1021   Sepsis Labs: @LABRCNTIP (procalcitonin:4,lacticidven:4) ) Recent Results (from the past 240 hour(s))  Resp Panel by RT-PCR (Flu A&B, Covid) Nasopharyngeal Swab     Status: None   Collection Time: 11/24/20  3:51 PM   Specimen: Nasopharyngeal Swab; Nasopharyngeal(NP) swabs in vial  transport medium  Result Value Ref Range Status   SARS Coronavirus 2 by RT PCR NEGATIVE NEGATIVE Final    Comment: (  NOTE) SARS-CoV-2 target nucleic acids are NOT DETECTED.  The SARS-CoV-2 RNA is generally detectable in upper respiratory specimens during the acute phase of infection. The lowest concentration of SARS-CoV-2 viral copies this assay can detect is 138 copies/mL. A negative result does not preclude SARS-Cov-2 infection and should not be used as the sole basis for treatment or other patient management decisions. A negative result may occur with  improper specimen collection/handling, submission of specimen other than nasopharyngeal swab, presence of viral mutation(s) within the areas targeted by this assay, and inadequate number of viral copies(<138 copies/mL). A negative result must be combined with clinical observations, patient history, and epidemiological information. The expected result is Negative.  Fact Sheet for Patients:  EntrepreneurPulse.com.au  Fact Sheet for Healthcare Providers:  IncredibleEmployment.be  This test is no t yet approved or cleared by the [place] FDA and  has been authorized for detection and/or diagnosis of SARS-CoV-2 by FDA under an Emergency Use Authorization (EUA). This EUA will remain  in effect (meaning this test can be used) for the duration of the COVID-19 declaration under Section 564(b)(1) of the Act, 21 U.S.C.section 360bbb-3(b)(1), unless the authorization is terminated  or revoked sooner.       Influenza A by PCR NEGATIVE NEGATIVE Final   Influenza B by PCR NEGATIVE NEGATIVE Final    Comment: (NOTE) The Xpert Xpress SARS-CoV-2/FLU/RSV plus assay is intended as an aid in the diagnosis of influenza from Nasopharyngeal swab specimens and should not be used as a sole basis for treatment. Nasal washings and aspirates are unacceptable for Xpert Xpress SARS-CoV-2/FLU/RSV testing.  Fact  Sheet for Patients: EntrepreneurPulse.com.au  Fact Sheet for Healthcare Providers: IncredibleEmployment.be  This test is not yet approved or cleared by the [place] FDA and has been authorized for detection and/or diagnosis of SARS-CoV-2 by FDA under an Emergency Use Authorization (EUA). This EUA will remain in effect (meaning this test can be used) for the duration of the COVID-19 declaration under Section 564(b)(1) of the Act, 21 U.S.C. section 360bbb-3(b)(1), unless the authorization is terminated or revoked.  Performed at [facility], [address] [place], [place] [address]      Radiological Exams on Admission: DG Tibia/Fibula Left  Result Date: 11/24/2020 CLINICAL DATA:  Open wound medial calf and lower posterior calf EXAM: LEFT TIBIA AND FIBULA - 2 VIEW COMPARISON:  02/08/2020 FINDINGS: Frontal and lateral views of the left tibia and fibula are obtained. There is extensive soft tissue edema throughout the entire visualized left lower leg. No evidence of subcutaneous emphysema. There are no acute or destructive bony lesions. Stable osteoarthritis of the left knee and ankle. IMPRESSION: 1. Diffuse soft tissue edema.  No evidence of subcutaneous gas. 2. No acute or destructive bony lesions. 3. Stable osteoarthritis. Electronically Signed   By: Randa Ngo M.D.   On: 11/24/2020 15:14   CT Tibia Fibula Left Wo Contrast  Result Date: 11/24/2020 CLINICAL DATA:  History of left lower leg abscess status post incision and drainage on 11/21/2020 EXAM: CT OF THE LOWER LEFT EXTREMITY WITHOUT CONTRAST TECHNIQUE: Multidetector CT imaging of the lower left extremity was performed according to the standard protocol. COMPARISON:  Ultrasound 11/17/2020.  X-ray 11/24/2020 FINDINGS: Bones/Joint/Cartilage No acute fracture. No dislocation. No bony erosion or periosteal elevation. Severe tricompartmental osteoarthritis of the left knee. Mild  degenerative changes at the left ankle. No lytic or sclerotic bone lesion. Ligaments Suboptimally assessed by CT. Muscles and Tendons No acute musculotendinous abnormality by CT. No  intramuscular fluid collection. Soft tissues Complex collection within the subcutaneous soft tissues along the medial aspect of the lower leg at the level of the proximal tibial diaphysis measuring approximately 6.0 x 2.9 x 3.1 cm (series 8, image 122). Density of this collection is greater than than simple fluid. There is stranding within the adjacent subcutaneous fat. Collection abuts the overlying skin surface with area of probable prior incision along its inferior margin. Overlying skin thickening. No gas within the collection. There is subcutaneous edema throughout the lower leg, more pronounced distally. No soft tissue gas. Prominent atherosclerotic vascular calcifications. IMPRESSION: 1. Complex collection within the subcutaneous soft tissues along the medial aspect of the left lower leg measuring up to 6.0 cm, most likely representing residual inflammatory collection given history of recent abscess drainage. 2. No acute osseous abnormality. No bony erosion or periosteal elevation to suggest osteomyelitis. 3. Diffuse subcutaneous edema throughout the lower leg, which may reflect cellulitis versus dependent change, although findings appear asymmetric compared with the included portion of the contralateral right lower extremity. No soft tissue gas. 4. Severe tricompartmental osteoarthritis of the left knee. Electronically Signed   By: Davina Poke D.O.   On: 11/24/2020 16:32      Assessment/Plan Principal Problem:   Abscess of left leg Active Problems:   A-fib (HCC)   Type 2 diabetes mellitus without complication, without long-term current use of insulin ([facility])   Atrial fibrillation with RVR ([facility])   Anticoagulated on Coumadin   Hyperlipidemia   Essential hypertension   Hyperlipidemia associated with type 2 diabetes  mellitus ([place])     #1 cellulitis and abscess of the left lower extremity: Patient will be admitted and placed on IV vancomycin and Zosyn.  She will be getting culture results.  Previous wound cultures suggestive of some Proteus.  We will continue monitoring.  #2 diabetes: Non-insulin-dependent.  We will initiate sliding scale insulin.  Continue to monitor.  #3 atrial fibrillation: On chronic anticoagulation.  Rate also controlled.  Resume Eliquis  #4 hyperlipidemia: Continue with statin  #5 essential hypertension: Continue blood pressure control.     DVT prophylaxis: Eliquis Code Status: Full code Family Communication: Daughter at bedside Disposition Plan: Home Consults called: None but surgical consult may be required Admission status: Inpatient  Severity of Illness: The appropriate patient status for this patient is INPATIENT. Inpatient status is judged to be reasonable and necessary in order to provide the required intensity of service to ensure the patient's safety. The patient's presenting symptoms, physical exam findings, and initial radiographic and laboratory data in the context of their chronic comorbidities is felt to place them at high risk for further clinical deterioration. Furthermore, it is not anticipated that the patient will be medically stable for discharge from the hospital within 2 midnights of admission. The following factors support the patient status of inpatient.   " The patient's presenting symptoms include left leg wound. " The worrisome physical exam findings include deep wound on the medial aspect. " The initial radiographic and laboratory data are worrisome because of complex cellulitis. " The chronic co-morbidities include diabetes with hypertension.   * I certify that at the point of admission it is my clinical judgment that the patient will require inpatient hospital care spanning beyond 2 midnights from the point of admission due to high intensity of  service, high risk for further deterioration and high frequency of surveillance required.Barbette Merino MD [facility] Pager [phone]  If 7PM-7AM, please contact night-coverage  [email or web address] Password [email or web address]  11/24/2020, 11:48 PM

## 2020-11-24 NOTE — ED Triage Notes (Signed)
Sent here from PMD :Advised to return to the emergency room for surgical I&D and IV antibiotic therapy.

## 2020-11-24 NOTE — Telephone Encounter (Signed)
Tried calling patients daughter on 11/21/20 but could not get through or leave a message. Patient has an appointment today for evaluation.

## 2020-11-24 NOTE — Progress Notes (Signed)
Established Patient Office Visit  Subjective:  Patient ID: Chelsea Dean, female    DOB: 06-02-1927  Age: 87 y.o. MRN: [id number]  CC:  Chief Complaint  Patient presents with   Hospitalization [facility] follow wound on left leg still draining a lot and swollen.     HPI Chelsea Dean presents for follow-up of an abscess over her left leg.  Status post drainage 3 days ago.  Diagnosed with cellulitis on the third and started on Keflex.  Returned to the emergency room 3 days ago when an area of fluctuance was drained with a large bore needle.  Antibiotics were continued.  Patient is accompanied by her daughter who reports extension of erythema swelling and tenderness.  There is been no fever but patient is not eating.  Significant past medical history of diabetes and ASCVD involving both of her lower extremities.  Past Medical History:  Diagnosis Date   A-fib ([place]) 10/19/2016   Abdominal aortic aneurysm (AAA) without rupture 09/15/2013   Acute idiopathic gout of right foot    AKI (acute kidney injury) ([place]) 10/18/2016   Anticoagulated on Coumadin    Arthritis    Diabetes mellitus without complication (Chelsea Dean)    Diverticular disease of large intestine 04/16/2013   Diverticulosis    DM2 (diabetes mellitus, type 2) ([place]) 10/19/2016   Dysrhythmia    Atrial fib   History of colon polyps 04/16/2013   Hx of colonic polyps    Hyperlipidemia    Hypertension    Long term current use of anticoagulant therapy 02/05/2015   Lumbar radiculopathy 03/28/2014   Microalbuminuria 04/16/2013   Osteoarthritis 04/16/2013   Osteoarthritis of cervical spine 03/28/2014   Pelvic mass 10/19/2016   Right renal mass    SBO (small bowel obstruction) ([place]) 10/18/2016   Thrombocytopenia ([place]) 04/16/2013   Overview:  referred to hematology 01/2104    Past Surgical History:  Procedure Laterality Date   ABDOMINAL AORTIC ANEURYSM REPAIR     ABDOMINAL HYSTERECTOMY     IR ANGIOGRAM EXTREMITY LEFT  04/01/2020   IR  ANGIOGRAM PELVIS SELECTIVE OR SUPRASELECTIVE  04/01/2020   IR ANGIOGRAM SELECTIVE EACH ADDITIONAL VESSEL  04/01/2020   IR ANGIOGRAM SELECTIVE EACH ADDITIONAL VESSEL  04/01/2020   IR ANGIOGRAM SELECTIVE EACH ADDITIONAL VESSEL  04/01/2020   IR AORTAGRAM ABDOMINAL SERIALOGRAM  04/01/2020   IR EMBO ARTERIAL NOT HEMORR HEMANG INC GUIDE ROADMAPPING  04/01/2020   IR GENERIC HISTORICAL  07/01/2015   IR RADIOLOGIST EVAL & MGMT 07/01/2015 [facility] INTERV RAD   IR RADIOLOGIST EVAL & MGMT  03/11/2020   IR RADIOLOGIST EVAL & MGMT  04/30/2020   IR THROMBECT PRIM MECH INIT (INCLU) MOD SED  04/01/2020   IR US GUIDE VASC ACCESS LEFT  04/01/2020   IR US GUIDE VASC ACCESS LEFT  04/01/2020   IR US GUIDE VASC ACCESS RIGHT  04/01/2020   THROMBECTOMY FEMORAL ARTERY Left 04/01/2020   Procedure: THROMBECTOMY LEFT POPLITEAL ARTERY;  Surgeon: Rosetta Posner, MD;  Location: [facility] OR;  Service: Vascular;  Laterality: Left;    Family History  Problem Relation Age of Onset   Cancer Sister        Rectal and Stomach    Social History   Socioeconomic History   Marital status: Married    Spouse name: Not on file   Number of children: Not on file   Years of education: Not on file   Highest education level: Not on file  Occupational History  Occupation: retired [employer]  Tobacco Use   Smoking status: Never   Smokeless tobacco: Current    Types: Snuff  Vaping Use   Vaping Use: Never used  Substance and Sexual Activity   Alcohol use: No    Alcohol/week: 0.0 standard drinks   Drug use: No   Sexual activity: Not on file  Other Topics Concern   Not on file  Social History Narrative   Admitted to [facility] 10/27/16   Married   Use snuff   Alcohol none   DNR   Social Determinants of Health   Financial Resource Strain: Low Risk    Difficulty of Paying Living Expenses: Not hard at all  Food Insecurity: No Food Insecurity   Worried About Charity fundraiser in the Last Year: Never true   Okay  in the Last Year: Never true  Transportation Needs: No Transportation Needs   Lack of Transportation (Medical): No   Lack of Transportation (Non-Medical): No  Physical Activity: Insufficiently Active   Days of Exercise per Week: 3 days   Minutes of Exercise per Session: 10 min  Stress: No Stress Concern Present   Feeling of Stress : Not at all  Social Connections: Socially Isolated   Frequency of Communication with Friends and Family: Three times a week   Frequency of Social Gatherings with Friends and Family: Once a week   Attends Religious Services: Never   Marine scientist or Organizations: No   Attends Archivist Meetings: Never   Marital Status: Widowed  Human resources officer Violence: Not At Risk   Fear of Current or Ex-Partner: No   Emotionally Abused: No   Physically Abused: No   Sexually Abused: No    Outpatient Medications Prior to Visit  Medication Sig Dispense Refill   amLODipine (NORVASC) 10 MG tablet TAKE 1 TABLET BY MOUTH EVERY DAY FOR BLOOD PRESSURE 90 tablet 1   atorvastatin (LIPITOR) 10 MG tablet TAKE 1 TABLET BY MOUTH EVERY DAY 90 tablet 3   cephALEXin (KEFLEX) 500 MG capsule Take 1 capsule (500 mg total) by mouth 4 (four) times daily. 28 capsule 0   diclofenac sodium (VOLTAREN) 1 % GEL Apply 2 g topically daily as needed (KNEE PAIN).     ELIQUIS 5 MG TABS tablet TAKE 1 TABLET BY MOUTH TWICE DAILY FOR 30 DAYS (02/13/20 TO INDEFINITELY) 60 tablet 5   glipiZIDE (GLUCOTROL XL) 10 MG 24 hr tablet TAKE 1 TABLET BY MOUTH EVERY DAY WITH BREAKFAST 90 tablet 2   Lidocaine (HM LIDOCAINE PATCH) 4 % PTCH Apply 1 patch topically every 12 (twelve) hours. 14 patch 0   losartan (COZAAR) 100 MG tablet TAKE 1 TABLET BY MOUTH EVERY DAY 90 tablet 1   metoprolol tartrate (LOPRESSOR) 100 MG tablet TAKE 1 TABLET BY MOUTH 2 TIMES A DAY 180 tablet 1   traMADol (ULTRAM) 50 MG tablet TAKE 1 TABLET BY MOUTH EVERY 12 HOURS ASNEEDED FOR PAIN 60 tablet 0   acetaminophen (TYLENOL)  650 MG CR tablet Take 650-1,300 mg by mouth every 8 (eight) hours as needed for pain. (Patient not taking: Reported on 11/24/2020)     No facility-administered medications prior to visit.    Allergies  Allergen Reactions   Ace Inhibitors Shortness Of Breath and Rash   Contrast Media [Iodinated Diagnostic Agents] Shortness Of Breath and Rash   Nsaids     Unknown reaction    ROS Review of Systems  Constitutional:  Negative.   HENT:  Positive for hearing loss.   Respiratory: Negative.    Cardiovascular: Negative.   Gastrointestinal: Negative.   Musculoskeletal:  Positive for gait problem.  Skin:  Positive for color change and wound.  Neurological:  Positive for weakness.  Psychiatric/Behavioral: Negative.       Objective:    Physical Exam Vitals and nursing note reviewed.  Constitutional:      General: She is not in acute distress.    Appearance: She is ill-appearing. She is not toxic-appearing or diaphoretic.  HENT:     Head: Normocephalic and atraumatic.  Eyes:     General: No scleral icterus.       Right eye: No discharge.        Left eye: No discharge.     Conjunctiva/sclera: Conjunctivae normal.  Pulmonary:     Effort: Pulmonary effort is normal.  Skin:      Neurological:     Mental Status: She is alert and oriented to person, place, and time.  Psychiatric:        Behavior: Behavior normal.    BP 116/68 (BP Location: Left Arm, Patient Position: Sitting, Cuff Size: Normal)   Pulse 85   Temp (!) 97.1 F (36.2 C) (Temporal)   Ht 5\' 2"  (1.575 m)   Wt 165 lb (74.8 kg)   SpO2 96%   BMI 30.18 kg/m  Wt Readings from Last 3 Encounters:  11/24/20 165 lb (74.8 kg)  11/17/20 165 lb (74.8 kg)  09/18/20 154 lb 6.4 oz (70 kg)     Health Maintenance Due  Topic Date Due   TETANUS/TDAP  Never done   Zoster Vaccines- Shingrix (1 of 2) Never done   DEXA SCAN  Never done   INFLUENZA VACCINE  09/15/2020    There are no preventive care reminders to display for  this patient.  Lab Results  Component Value Date   TSH 3.26 03/02/2017   Lab Results  Component Value Date   WBC 7.4 11/17/2020   HGB 13.5 11/17/2020   HCT 43.5 11/17/2020   MCV 85.6 11/17/2020   PLT 95 (L) 11/17/2020   Lab Results  Component Value Date   NA 138 11/17/2020   K 3.7 11/17/2020   CO2 24 11/17/2020   GLUCOSE 175 (H) 11/17/2020   BUN 21 11/17/2020   CREATININE 1.05 (H) 11/17/2020   BILITOT 0.6 09/18/2020   ALKPHOS 50 09/18/2020   AST 13 09/18/2020   ALT 7 09/18/2020   PROT 6.5 09/18/2020   ALBUMIN 4.0 09/18/2020   CALCIUM 10.9 (H) 11/17/2020   ANIONGAP 12 11/17/2020   GFR 32.92 (L) 09/18/2020   Lab Results  Component Value Date   CHOL 134 06/06/2020   Lab Results  Component Value Date   HDL 33.10 (L) 06/06/2020   Lab Results  Component Value Date   LDLCALC 73 06/06/2020   Lab Results  Component Value Date   TRIG 135.0 06/06/2020   Lab Results  Component Value Date   CHOLHDL 4 06/06/2020   Lab Results  Component Value Date   HGBA1C 6.3 09/18/2020      Assessment & Plan:   Problem List Items Addressed This Visit       Other   Need for influenza vaccination - Primary   Relevant Orders   Flu Vaccine QUAD High Dose(Fluad)   Abscess of left leg    No orders of the defined types were placed in this encounter.   Follow-up: No follow-ups on  file.  Advised to return to the emergency room for surgical I&D and IV antibiotic therapy.  [name], MD

## 2020-11-24 NOTE — Progress Notes (Signed)
Pharmacy Antibiotic Note  Chelsea Dean is a 87 y.o. female admitted on 11/24/2020 with cellulitis, worsening on PTA Keflex.  Pharmacy has been consulted for vancomycin/zosyn dosing. SCr 0.98 on admit (appears to be near baseline).  Plan: Zosyn 3.375g IV (54min infusion) x1; then 3.375g IV q8h (4h infusion) Vancomycin 1g IV x1 (due to product availability at [facility]); then 1g IV Q 48 hrs. Goal AUC 400-550. Expected AUC: 462 SCr used: 0.98 Monitor clinical progress, c/s, renal function F/u de-escalation plan/LOT, vancomycin levels as indicated   Height: 5\' 2"  (157.5 cm) Weight: 74.8 kg (165 lb) IBW/kg (Calculated) : 50.1  Temp (24hrs), Avg:98.2 F (36.8 C), Min:97.1 F (36.2 C), Max:99.3 F (37.4 C)  Recent Labs  Lab 11/24/20 1329 11/24/20 1511  WBC 9.2  --   CREATININE 0.98  --   LATICACIDVEN  --  1.9    Estimated Creatinine Clearance: 35.4 mL/min (by C-G formula based on SCr of 0.98 mg/dL).    Allergies  Allergen Reactions   Ace Inhibitors Shortness Of Breath and Rash   Contrast Media [Iodinated Diagnostic Agents] Shortness Of Breath and Rash   Nsaids     Unknown reaction    Antimicrobials this admission: 10/10 vancomycin >>  10/10 zosyn >>   Dose adjustments this admission:   Microbiology results:   Arturo Morton, PharmD, BCPS Please check AMION for all [place] contact numbers Clinical Pharmacist 11/24/2020 5:11 PM

## 2020-11-25 DIAGNOSIS — I4811 Longstanding persistent atrial fibrillation: Secondary | ICD-10-CM

## 2020-11-25 DIAGNOSIS — L03116 Cellulitis of left lower limb: Secondary | ICD-10-CM

## 2020-11-25 DIAGNOSIS — T8142XA Infection following a procedure, deep incisional surgical site, initial encounter: Secondary | ICD-10-CM

## 2020-11-25 DIAGNOSIS — T82868A Thrombosis of vascular prosthetic devices, implants and grafts, initial encounter: Secondary | ICD-10-CM

## 2020-11-25 DIAGNOSIS — L02416 Cutaneous abscess of left lower limb: Secondary | ICD-10-CM | POA: Diagnosis not present

## 2020-11-25 LAB — CBC
HCT: 35.6 % — ABNORMAL LOW (ref 36.0–46.0)
Hemoglobin: 11.1 g/dL — ABNORMAL LOW (ref 12.0–15.0)
MCH: 26.6 pg (ref 26.0–34.0)
MCHC: 31.2 g/dL (ref 30.0–36.0)
MCV: 85.2 fL (ref 80.0–100.0)
Platelets: 160 10*3/uL (ref 150–400)
RBC: 4.18 MIL/uL (ref 3.87–5.11)
RDW: 14.3 % (ref 11.5–15.5)
WBC: 6.8 10*3/uL (ref 4.0–10.5)
nRBC: 0 % (ref 0.0–0.2)

## 2020-11-25 LAB — GLUCOSE, CAPILLARY
Glucose-Capillary: 102 mg/dL — ABNORMAL HIGH (ref 70–99)
Glucose-Capillary: 104 mg/dL — ABNORMAL HIGH (ref 70–99)
Glucose-Capillary: 119 mg/dL — ABNORMAL HIGH (ref 70–99)
Glucose-Capillary: 84 mg/dL (ref 70–99)
Glucose-Capillary: 85 mg/dL (ref 70–99)

## 2020-11-25 LAB — COMPREHENSIVE METABOLIC PANEL
ALT: 15 U/L (ref 0–44)
AST: 19 U/L (ref 15–41)
Albumin: 3.2 g/dL — ABNORMAL LOW (ref 3.5–5.0)
Alkaline Phosphatase: 41 U/L (ref 38–126)
Anion gap: 10 (ref 5–15)
BUN: 14 mg/dL (ref 8–23)
CO2: 26 mmol/L (ref 22–32)
Calcium: 9.9 mg/dL (ref 8.9–10.3)
Chloride: 101 mmol/L (ref 98–111)
Creatinine, Ser: 0.97 mg/dL (ref 0.44–1.00)
GFR, Estimated: 55 mL/min — ABNORMAL LOW (ref >60)
Glucose, Bld: 119 mg/dL — ABNORMAL HIGH (ref 70–99)
Potassium: 3.2 mmol/L — ABNORMAL LOW (ref 3.5–5.1)
Sodium: 137 mmol/L (ref 135–145)
Total Bilirubin: 0.8 mg/dL (ref 0.3–1.2)
Total Protein: 6.8 g/dL (ref 6.5–8.1)

## 2020-11-25 LAB — CREATININE, SERUM
Creatinine, Ser: 0.98 mg/dL (ref 0.44–1.00)
GFR, Estimated: 54 mL/min — ABNORMAL LOW (ref >60)

## 2020-11-25 LAB — HEMOGLOBIN A1C
Hgb A1c MFr Bld: 5.7 % — ABNORMAL HIGH (ref 4.8–5.6)
Mean Plasma Glucose: 116.89 mg/dL

## 2020-11-25 MED ORDER — POLYETHYLENE GLYCOL 3350 17 G PO PACK
17.0000 g | PACK | Freq: Once | ORAL | Status: AC
  Administered 2020-11-25: 17 g via ORAL
  Filled 2020-11-25: qty 1

## 2020-11-25 MED ORDER — POTASSIUM CHLORIDE CRYS ER 20 MEQ PO TBCR
40.0000 meq | EXTENDED_RELEASE_TABLET | Freq: Once | ORAL | Status: AC
  Administered 2020-11-25: 40 meq via ORAL
  Filled 2020-11-25: qty 2

## 2020-11-25 MED ORDER — LOSARTAN POTASSIUM 50 MG PO TABS
100.0000 mg | ORAL_TABLET | Freq: Every day | ORAL | Status: DC
  Administered 2020-11-25 – 2020-11-28 (×3): 100 mg via ORAL
  Filled 2020-11-25 (×3): qty 2

## 2020-11-25 MED ORDER — METOPROLOL TARTRATE 5 MG/5ML IV SOLN: 5.0000 mg | INTRAVENOUS | Status: DC | PRN

## 2020-11-25 NOTE — H&P (View-Only) (Signed)
Hospital Consult    Reason for Consult: Left lower extremity cellulitis Referring Physician: Dr.Girguis MRN #:  [id number]  History of Present Illness: This is a 87 y.o. female with remote history of abdominal aortic aneurysm repair with known occluded left limb of the aneurysm graft.  Last February she was undergoing coil embolization of an endoleak subsequently ended up with left lower extremity embolic disease and required embolectomy from a below-knee popliteal exposure site.  After this she had posterior tibial signals she was discharged followed up in our office where she was thought not to have symptomatic occlusion of her left iliac limb and did not require right to left femorofemoral bypass.  1 week ago she presented here with cellulitis of her left lower extremity required needle drainage and was placed on antibiotics.  She is now admitted with the same problem.  Orthopedic surgery has been contacted now I am asked to evaluate the patient for recurrent cellulitis of the left lower extremity with known occluded left aortic aneurysm repair limb.  Patient only complains of pain in the left below-knee previous incision site.  Patient lives with her daughter.  According to the daughter she has not walked any significant distance since her surgery in February.  Past Medical History:  Diagnosis Date   A-fib ([place]) 10/19/2016   Abdominal aortic aneurysm (AAA) without rupture 09/15/2013   Acute idiopathic gout of right foot    AKI (acute kidney injury) ([place]) 10/18/2016   Anticoagulated on Coumadin    Arthritis    Diabetes mellitus without complication ([place])    Diverticular disease of large intestine 04/16/2013   Diverticulosis    DM2 (diabetes mellitus, type 2) ([place]) 10/19/2016   Dysrhythmia    Atrial fib   History of colon polyps 04/16/2013   Hx of colonic polyps    Hyperlipidemia    Hypertension    Long term current use of anticoagulant therapy 02/05/2015   Lumbar radiculopathy 03/28/2014    Microalbuminuria 04/16/2013   Osteoarthritis 04/16/2013   Osteoarthritis of cervical spine 03/28/2014   Pelvic mass 10/19/2016   Right renal mass    SBO (small bowel obstruction) ([place]) 10/18/2016   Thrombocytopenia ([place]) 04/16/2013   Overview:  referred to hematology 01/2104    Past Surgical History:  Procedure Laterality Date   ABDOMINAL AORTIC ANEURYSM REPAIR     ABDOMINAL HYSTERECTOMY     IR ANGIOGRAM EXTREMITY LEFT  04/01/2020   IR ANGIOGRAM PELVIS SELECTIVE OR SUPRASELECTIVE  04/01/2020   IR ANGIOGRAM SELECTIVE EACH ADDITIONAL VESSEL  04/01/2020   IR ANGIOGRAM SELECTIVE EACH ADDITIONAL VESSEL  04/01/2020   IR ANGIOGRAM SELECTIVE EACH ADDITIONAL VESSEL  04/01/2020   IR AORTAGRAM ABDOMINAL SERIALOGRAM  04/01/2020   IR EMBO ARTERIAL NOT HEMORR HEMANG INC GUIDE ROADMAPPING  04/01/2020   IR GENERIC HISTORICAL  07/01/2015   IR RADIOLOGIST EVAL & MGMT 07/01/2015 [facility] INTERV RAD   IR RADIOLOGIST EVAL & MGMT  03/11/2020   IR RADIOLOGIST EVAL & MGMT  04/30/2020   IR THROMBECT PRIM MECH INIT (INCLU) MOD SED  04/01/2020   IR US GUIDE VASC ACCESS LEFT  04/01/2020   IR US GUIDE VASC ACCESS LEFT  04/01/2020   IR US GUIDE VASC ACCESS RIGHT  04/01/2020   THROMBECTOMY FEMORAL ARTERY Left 04/01/2020   Procedure: THROMBECTOMY LEFT POPLITEAL ARTERY;  Surgeon: Rosetta Posner, MD;  Location: [place];  Service: Vascular;  Laterality: Left;    Allergies  Allergen Reactions   Ace Inhibitors Shortness Of Breath and Rash  Contrast Media [Iodinated Diagnostic Agents] Shortness Of Breath and Rash   Nsaids     Unknown reaction    Prior to Admission medications   Medication Sig Start Date End Date Taking? Authorizing Provider  acetaminophen (TYLENOL) 650 MG CR tablet Take 650-1,300 mg by mouth every 8 (eight) hours as needed for pain.   Yes [provider]  amLODipine (NORVASC) 10 MG tablet TAKE 1 TABLET BY MOUTH EVERY DAY FOR BLOOD PRESSURE 10/06/20  Yes [name], MD  atorvastatin (LIPITOR) 10 MG  tablet TAKE 1 TABLET BY MOUTH EVERY DAY 06/02/20  Yes [name], MD  cephALEXin (KEFLEX) 500 MG capsule Take 1 capsule (500 mg total) by mouth 4 (four) times daily. 11/17/20  Yes Isla Pence, MD  diclofenac sodium (VOLTAREN) 1 % GEL Apply 2 g topically daily as needed (KNEE PAIN).   Yes [provider]  ELIQUIS 5 MG TABS tablet TAKE 1 TABLET BY MOUTH TWICE DAILY FOR 30 DAYS (02/13/20 TO INDEFINITELY) 10/06/20  Yes [name], MD  glipiZIDE (GLUCOTROL XL) 10 MG 24 hr tablet TAKE 1 TABLET BY MOUTH EVERY DAY WITH BREAKFAST 03/06/20  Yes Dutch Quint B, FNP  losartan (COZAAR) 100 MG tablet TAKE 1 TABLET BY MOUTH EVERY DAY 10/06/20  Yes [name], MD  metoprolol tartrate (LOPRESSOR) 100 MG tablet TAKE 1 TABLET BY MOUTH 2 TIMES A DAY 05/15/20  Yes [name], MD  traMADol (ULTRAM) 50 MG tablet TAKE 1 TABLET BY MOUTH EVERY 12 HOURS ASNEEDED FOR PAIN 11/14/20  Yes [name], MD  Lidocaine (HM LIDOCAINE PATCH) 4 % PTCH Apply 1 patch topically every 12 (twelve) hours. Patient not taking: Reported on 14/97/0263 78/58/85   Delora Fuel, MD    Social History   Socioeconomic History   Marital status: Married    Spouse name: Not on file   Number of children: Not on file   Years of education: Not on file   Highest education level: Not on file  Occupational History   Occupation: retired [employer]  Tobacco Use   Smoking status: Never   Smokeless tobacco: Current    Types: Snuff  Vaping Use   Vaping Use: Never used  Substance and Sexual Activity   Alcohol use: No    Alcohol/week: 0.0 standard drinks   Drug use: No   Sexual activity: Not on file  Other Topics Concern   Not on file  Social History Narrative   Admitted to [facility] 10/27/16   Married   Use snuff   Alcohol none   DNR   Social Determinants of Health   Financial Resource Strain: Low Risk    Difficulty of Paying Living Expenses: Not hard at all   Food Insecurity: No Food Insecurity   Worried About Charity fundraiser in the Last Year: Never true   [place] in the Last Year: Never true  Transportation Needs: No Transportation Needs   Lack of Transportation (Medical): No   Lack of Transportation (Non-Medical): No  Physical Activity: Insufficiently Active   Days of Exercise per Week: 3 days   Minutes of Exercise per Session: 10 min  Stress: No Stress Concern Present   Feeling of Stress : Not at all  Social Connections: Socially Isolated   Frequency of Communication with Friends and Family: Three times a week   Frequency of Social Gatherings with Friends and Family: Once a week   Attends Religious Services: Never  Active Member of Clubs or Organizations: No   Attends Archivist Meetings: Never   Marital Status: Widowed  Human resources officer Violence: Not At Risk   Fear of Current or Ex-Partner: No   Emotionally Abused: No   Physically Abused: No   Sexually Abused: No     Family History  Problem Relation Age of Onset   Cancer Sister        Rectal and Stomach   Review of Systems  Constitutional:  Positive for fever and malaise/fatigue.  HENT: Negative.    Respiratory: Negative.    Cardiovascular:  Positive for leg swelling.  Gastrointestinal: Negative.   Musculoskeletal: Negative.   Skin:        Left leg draining wound  Neurological: Negative.   Endo/Heme/Allergies: Negative.   Psychiatric/Behavioral: Negative.       Physical Examination  Vitals:   11/25/20 0516 11/25/20 0848  BP: 134/68 130/70  Pulse: 85 92  Resp: 20   Temp: 98.9 F (37.2 C) 98.9 F (37.2 C)  SpO2: 93% 96%   Body mass index is 30.18 kg/m.  Physical Exam HENT:     Head: Normocephalic.     Nose: Nose normal.  Eyes:     Pupils: Pupils are equal, round, and reactive to light.  Cardiovascular:     Pulses:          Dorsalis pedis pulses are 0 on the right side and 0 on the left side.       Posterior tibial pulses are  0 on the right side and 0 on the left side.     Comments: Both feet are warm there is no evidence of ischemia Pulmonary:     Effort: Pulmonary effort is normal.  Abdominal:     General: Abdomen is flat.     Palpations: Abdomen is soft.  Musculoskeletal:     Right lower leg: No edema.     Left lower leg: Edema present.  Skin:    Capillary Refill: Capillary refill takes less than 2 seconds.     Comments: Left below-knee medial incision now with 2 cm opening distally with seropurulent drainage evident on bandaging  Neurological:     General: No focal deficit present.     Mental Status: She is alert.  Psychiatric:        Mood and Affect: Mood normal.        Thought Content: Thought content normal.        Judgment: Judgment normal.     CBC    Component Value Date/Time   WBC 6.8 11/24/2020 2355   RBC 4.18 11/24/2020 2355   HGB 11.1 (L) 11/24/2020 2355   HCT 35.6 (L) 11/24/2020 2355   PLT 160 11/24/2020 2355   MCV 85.2 11/24/2020 2355   MCH 26.6 11/24/2020 2355   MCHC 31.2 11/24/2020 2355   RDW 14.3 11/24/2020 2355   LYMPHSABS 0.6 (L) 11/24/2020 1329   MONOABS 1.0 11/24/2020 1329   EOSABS 0.1 11/24/2020 1329   BASOSABS 0.0 11/24/2020 1329    BMET    Component Value Date/Time   NA 137 11/24/2020 2355   K 3.2 (L) 11/24/2020 2355   CL 101 11/24/2020 2355   CO2 26 11/24/2020 2355   GLUCOSE 119 (H) 11/24/2020 2355   BUN 14 11/24/2020 2355   CREATININE 0.98 11/24/2020 2355   CREATININE 0.97 11/24/2020 2355   CALCIUM 9.9 11/24/2020 2355   GFRNONAA 54 (L) 11/24/2020 2355   GFRNONAA 55 (L) 11/24/2020 2355  GFRAA 51 (L) 04/01/2018 1854    COAGS: Lab Results  Component Value Date   INR 1.4 (H) 04/01/2020   INR 2.29 04/01/2018   INR 2.76 08/15/2017     Non-Invasive Vascular Imaging:   CT Left leg IMPRESSION: 1. Complex collection within the subcutaneous soft tissues along the medial aspect of the left lower leg measuring up to 6.0 cm, most likely representing  residual inflammatory collection given history of recent abscess drainage. 2. No acute osseous abnormality. No bony erosion or periosteal elevation to suggest osteomyelitis. 3. Diffuse subcutaneous edema throughout the lower leg, which may reflect cellulitis versus dependent change, although findings appear asymmetric compared with the included portion of the contralateral right lower extremity. No soft tissue gas. 4. Severe tricompartmental osteoarthritis of the left knee.     ASSESSMENT/PLAN: This is a 87 y.o. female prevents with delayed wound complication from previous below-knee popliteal embolectomy.  Unknown reason for delay in presentation although according to family this has never appeared to completely heal.  May be related to stitch abscess.  There is no foreign material placed at the time of surgery and does not appear to communicate to the deep space.  Her left lower extremity does appear to be warm and well-perfused.  I have recommended transfer to [facility] with surgical drainage and likely closure by secondary intention of below-knee popliteal exposure wound.  Alternatives would be continued antibiotics and local wound care and more aggressively we could consider revascularization of her left lower extremity prior to any further surgical attempts which would require right to left femorofemoral bypass but given that leg appears well perfused I discussed attempting wound healing first.  Patient's daughter is in good understanding I will communicate the need for transfer to [facility] to the primary team.  Erlene Quan C. Donzetta Matters, MD [facility] Office: [phone] Pager: [phone]

## 2020-11-25 NOTE — Progress Notes (Signed)
  Progress Note    Chelsea Dean   QGB:[id number]  DOB: March 17, 1927  DOA: 11/24/2020     1 Date of Service: 11/25/2020   Chelsea Dean is a 87 yo female with PMH DMII, HTN, afib, diverticulosis, HLD, AAA who presented to the hospital with an ongoing left leg wound. She had previously undergone outpatient I&D along with an antibiotic course with minimal improvement. She therefore presented to the ER for further evaluation.  CT was obtained in the ER which showed a complex collection involving subcutaneous tissues at the medial aspect of the left lower leg measuring up to 6 cm.  Concern was for underlying residual abscess. Of note she had undergone thrombectomy in the left lower extremity in February 2022 with vascular surgery. She was started on antibiotics and underwent evaluation with vascular surgery.  She was recommended to undergo surgical drainage of her wound.   Subjective:  Daughter present bedside this morning. Patient in no distress. Still endorses pain in LLE.   Hospital Problems * Abscess of left leg - CT shows ~6cm collection; concern is a stitch abscess from prior surgery in Feb 2022; patient is minimally ambulatory at baseline since surgery - appreciate vascular surgery evaluation; transfer [facility] initiated for tentative surgery on 10/12 - NPO at [facility] - continue abx for now; de-escalate as able; follow up any OR cultures  Essential hypertension - on amlodipine, losartan, and lopressor at home   Hyperlipidemia - resume lipitor after surgery   Type 2 diabetes mellitus without complication, without long-term current use of insulin ([place]) - continue SSI and CBG monitoring   A-fib (HCC) - hold Eliquis for now; can resume post-op as cleared by surgery - continue PRN lopressor      Objective Vital signs were reviewed and unremarkable.  Vitals:   11/24/20 2306 11/25/20 0516 11/25/20 0848 11/25/20 1218  BP: 129/64 134/68 130/70 (!) 155/91  Pulse: 82 85 92 91  Resp: 20 20   20   Temp: 99.8 F (37.7 C) 98.9 F (37.2 C) 98.9 F (37.2 C) 100.3 F (37.9 C)  TempSrc:   Oral Oral  SpO2: 96% 93% 96% 94%  Weight:      Height:       74.8 kg  Exam General appearance: alert, cooperative, fatigued, and no distress Head: Normocephalic, without obvious abnormality, atraumatic Eyes:  EOMI Throat:  moist mucous membranes Neck: no JVD and supple, symmetrical, trachea midline Lungs: clear to auscultation bilaterally Heart: irregularly irregular rhythm, S1, S2 normal, and 1+ LLE pitting edema Abdomen: normal findings: bowel sounds normal and soft, non-tender Extremities:  no cyanosis Skin: mobility and turgor normal Neurologic: moves all 4 extremities; follows commands Psych: normal affect    Labs / Other Information My review of labs, imaging, notes and other tests shows no new significant findings.    Time spent: Greater than 50% of the 35 minute visit was spent in counseling/coordination of care for the patient as laid out in the A&P.  Dwyane Dee, MD [facility] 11/25/2020, 2:40 PM

## 2020-11-25 NOTE — Assessment & Plan Note (Signed)
-   continue SSI and CBG monitoring  

## 2020-11-25 NOTE — Assessment & Plan Note (Signed)
-   resume lipitor after surgery

## 2020-11-25 NOTE — Assessment & Plan Note (Signed)
-   on amlodipine, losartan, and lopressor at home

## 2020-11-25 NOTE — Plan of Care (Signed)

## 2020-11-25 NOTE — Consult Note (Signed)
Hospital Consult    Reason for Consult: Left lower extremity cellulitis Referring Physician: Dr.Girguis MRN #:  [id number]  History of Present Illness: This is a 87 y.o. female with remote history of abdominal aortic aneurysm repair with known occluded left limb of the aneurysm graft.  Last February she was undergoing coil embolization of an endoleak subsequently ended up with left lower extremity embolic disease and required embolectomy from a below-knee popliteal exposure site.  After this she had posterior tibial signals she was discharged followed up in our office where she was thought not to have symptomatic occlusion of her left iliac limb and did not require right to left femorofemoral bypass.  1 week ago she presented here with cellulitis of her left lower extremity required needle drainage and was placed on antibiotics.  She is now admitted with the same problem.  Orthopedic surgery has been contacted now I am asked to evaluate the patient for recurrent cellulitis of the left lower extremity with known occluded left aortic aneurysm repair limb.  Patient only complains of pain in the left below-knee previous incision site.  Patient lives with her daughter.  According to the daughter she has not walked any significant distance since her surgery in February.  Past Medical History:  Diagnosis Date   A-fib ([place]) 10/19/2016   Abdominal aortic aneurysm (AAA) without rupture 09/15/2013   Acute idiopathic gout of right foot    AKI (acute kidney injury) ([place]) 10/18/2016   Anticoagulated on Coumadin    Arthritis    Diabetes mellitus without complication ([place])    Diverticular disease of large intestine 04/16/2013   Diverticulosis    DM2 (diabetes mellitus, type 2) ([place]) 10/19/2016   Dysrhythmia    Atrial fib   History of colon polyps 04/16/2013   Hx of colonic polyps    Hyperlipidemia    Hypertension    Long term current use of anticoagulant therapy 02/05/2015   Lumbar radiculopathy 03/28/2014    Microalbuminuria 04/16/2013   Osteoarthritis 04/16/2013   Osteoarthritis of cervical spine 03/28/2014   Pelvic mass 10/19/2016   Right renal mass    SBO (small bowel obstruction) ([place]) 10/18/2016   Thrombocytopenia ([place]) 04/16/2013   Overview:  referred to hematology 01/2104    Past Surgical History:  Procedure Laterality Date   ABDOMINAL AORTIC ANEURYSM REPAIR     ABDOMINAL HYSTERECTOMY     IR ANGIOGRAM EXTREMITY LEFT  04/01/2020   IR ANGIOGRAM PELVIS SELECTIVE OR SUPRASELECTIVE  04/01/2020   IR ANGIOGRAM SELECTIVE EACH ADDITIONAL VESSEL  04/01/2020   IR ANGIOGRAM SELECTIVE EACH ADDITIONAL VESSEL  04/01/2020   IR ANGIOGRAM SELECTIVE EACH ADDITIONAL VESSEL  04/01/2020   IR AORTAGRAM ABDOMINAL SERIALOGRAM  04/01/2020   IR EMBO ARTERIAL NOT HEMORR HEMANG INC GUIDE ROADMAPPING  04/01/2020   IR GENERIC HISTORICAL  07/01/2015   IR RADIOLOGIST EVAL & MGMT 07/01/2015 [facility] INTERV RAD   IR RADIOLOGIST EVAL & MGMT  03/11/2020   IR RADIOLOGIST EVAL & MGMT  04/30/2020   IR THROMBECT PRIM MECH INIT (INCLU) MOD SED  04/01/2020   IR US GUIDE VASC ACCESS LEFT  04/01/2020   IR US GUIDE VASC ACCESS LEFT  04/01/2020   IR US GUIDE VASC ACCESS RIGHT  04/01/2020   THROMBECTOMY FEMORAL ARTERY Left 04/01/2020   Procedure: THROMBECTOMY LEFT POPLITEAL ARTERY;  Surgeon: Rosetta Posner, MD;  Location: [facility];  Service: Vascular;  Laterality: Left;    Allergies  Allergen Reactions   Ace Inhibitors Shortness Of Breath and Rash  Contrast Media [Iodinated Diagnostic Agents] Shortness Of Breath and Rash   Nsaids     Unknown reaction    Prior to Admission medications   Medication Sig Start Date End Date Taking? Authorizing Provider  acetaminophen (TYLENOL) 650 MG CR tablet Take 650-1,300 mg by mouth every 8 (eight) hours as needed for pain.   Yes [provider]  amLODipine (NORVASC) 10 MG tablet TAKE 1 TABLET BY MOUTH EVERY DAY FOR BLOOD PRESSURE 10/06/20  Yes [name], MD  atorvastatin (LIPITOR) 10 MG  tablet TAKE 1 TABLET BY MOUTH EVERY DAY 06/02/20  Yes [name], MD  cephALEXin (KEFLEX) 500 MG capsule Take 1 capsule (500 mg total) by mouth 4 (four) times daily. 11/17/20  Yes Isla Pence, MD  diclofenac sodium (VOLTAREN) 1 % GEL Apply 2 g topically daily as needed (KNEE PAIN).   Yes [provider]  ELIQUIS 5 MG TABS tablet TAKE 1 TABLET BY MOUTH TWICE DAILY FOR 30 DAYS (02/13/20 TO INDEFINITELY) 10/06/20  Yes [name], MD  glipiZIDE (GLUCOTROL XL) 10 MG 24 hr tablet TAKE 1 TABLET BY MOUTH EVERY DAY WITH BREAKFAST 03/06/20  Yes Dutch Quint B, FNP  losartan (COZAAR) 100 MG tablet TAKE 1 TABLET BY MOUTH EVERY DAY 10/06/20  Yes [name], MD  metoprolol tartrate (LOPRESSOR) 100 MG tablet TAKE 1 TABLET BY MOUTH 2 TIMES A DAY 05/15/20  Yes [name], MD  traMADol (ULTRAM) 50 MG tablet TAKE 1 TABLET BY MOUTH EVERY 12 HOURS ASNEEDED FOR PAIN 11/14/20  Yes [name], MD  Lidocaine (HM LIDOCAINE PATCH) 4 % PTCH Apply 1 patch topically every 12 (twelve) hours. Patient not taking: Reported on 58/10/9831 82/50/53   Delora Fuel, MD    Social History   Socioeconomic History   Marital status: Married    Spouse name: Not on file   Number of children: Not on file   Years of education: Not on file   Highest education level: Not on file  Occupational History   Occupation: retired [employer]  Tobacco Use   Smoking status: Never   Smokeless tobacco: Current    Types: Snuff  Vaping Use   Vaping Use: Never used  Substance and Sexual Activity   Alcohol use: No    Alcohol/week: 0.0 standard drinks   Drug use: No   Sexual activity: Not on file  Other Topics Concern   Not on file  Social History Narrative   Admitted to [facility] 10/27/16   Married   Use snuff   Alcohol none   DNR   Social Determinants of Health   Financial Resource Strain: Low Risk    Difficulty of Paying Living Expenses: Not hard at all   Food Insecurity: No Food Insecurity   Worried About Charity fundraiser in the Last Year: Never true   [place] in the Last Year: Never true  Transportation Needs: No Transportation Needs   Lack of Transportation (Medical): No   Lack of Transportation (Non-Medical): No  Physical Activity: Insufficiently Active   Days of Exercise per Week: 3 days   Minutes of Exercise per Session: 10 min  Stress: No Stress Concern Present   Feeling of Stress : Not at all  Social Connections: Socially Isolated   Frequency of Communication with Friends and Family: Three times a week   Frequency of Social Gatherings with Friends and Family: Once a week   Attends Religious Services: Never  Active Member of Clubs or Organizations: No   Attends Archivist Meetings: Never   Marital Status: Widowed  Human resources officer Violence: Not At Risk   Fear of Current or Ex-Partner: No   Emotionally Abused: No   Physically Abused: No   Sexually Abused: No     Family History  Problem Relation Age of Onset   Cancer Sister        Rectal and Stomach   Review of Systems  Constitutional:  Positive for fever and malaise/fatigue.  HENT: Negative.    Respiratory: Negative.    Cardiovascular:  Positive for leg swelling.  Gastrointestinal: Negative.   Musculoskeletal: Negative.   Skin:        Left leg draining wound  Neurological: Negative.   Endo/Heme/Allergies: Negative.   Psychiatric/Behavioral: Negative.       Physical Examination  Vitals:   11/25/20 0516 11/25/20 0848  BP: 134/68 130/70  Pulse: 85 92  Resp: 20   Temp: 98.9 F (37.2 C) 98.9 F (37.2 C)  SpO2: 93% 96%   Body mass index is 30.18 kg/m.  Physical Exam HENT:     Head: Normocephalic.     Nose: Nose normal.  Eyes:     Pupils: Pupils are equal, round, and reactive to light.  Cardiovascular:     Pulses:          Dorsalis pedis pulses are 0 on the right side and 0 on the left side.       Posterior tibial pulses are  0 on the right side and 0 on the left side.     Comments: Both feet are warm there is no evidence of ischemia Pulmonary:     Effort: Pulmonary effort is normal.  Abdominal:     General: Abdomen is flat.     Palpations: Abdomen is soft.  Musculoskeletal:     Right lower leg: No edema.     Left lower leg: Edema present.  Skin:    Capillary Refill: Capillary refill takes less than 2 seconds.     Comments: Left below-knee medial incision now with 2 cm opening distally with seropurulent drainage evident on bandaging  Neurological:     General: No focal deficit present.     Mental Status: She is alert.  Psychiatric:        Mood and Affect: Mood normal.        Thought Content: Thought content normal.        Judgment: Judgment normal.     CBC    Component Value Date/Time   WBC 6.8 11/24/2020 2355   RBC 4.18 11/24/2020 2355   HGB 11.1 (L) 11/24/2020 2355   HCT 35.6 (L) 11/24/2020 2355   PLT 160 11/24/2020 2355   MCV 85.2 11/24/2020 2355   MCH 26.6 11/24/2020 2355   MCHC 31.2 11/24/2020 2355   RDW 14.3 11/24/2020 2355   LYMPHSABS 0.6 (L) 11/24/2020 1329   MONOABS 1.0 11/24/2020 1329   EOSABS 0.1 11/24/2020 1329   BASOSABS 0.0 11/24/2020 1329    BMET    Component Value Date/Time   NA 137 11/24/2020 2355   K 3.2 (L) 11/24/2020 2355   CL 101 11/24/2020 2355   CO2 26 11/24/2020 2355   GLUCOSE 119 (H) 11/24/2020 2355   BUN 14 11/24/2020 2355   CREATININE 0.98 11/24/2020 2355   CREATININE 0.97 11/24/2020 2355   CALCIUM 9.9 11/24/2020 2355   GFRNONAA 54 (L) 11/24/2020 2355   GFRNONAA 55 (L) 11/24/2020 2355  GFRAA 51 (L) 04/01/2018 1854    COAGS: Lab Results  Component Value Date   INR 1.4 (H) 04/01/2020   INR 2.29 04/01/2018   INR 2.76 08/15/2017     Non-Invasive Vascular Imaging:   CT Left leg IMPRESSION: 1. Complex collection within the subcutaneous soft tissues along the medial aspect of the left lower leg measuring up to 6.0 cm, most likely representing  residual inflammatory collection given history of recent abscess drainage. 2. No acute osseous abnormality. No bony erosion or periosteal elevation to suggest osteomyelitis. 3. Diffuse subcutaneous edema throughout the lower leg, which may reflect cellulitis versus dependent change, although findings appear asymmetric compared with the included portion of the contralateral right lower extremity. No soft tissue gas. 4. Severe tricompartmental osteoarthritis of the left knee.     ASSESSMENT/PLAN: This is a 87 y.o. female prevents with delayed wound complication from previous below-knee popliteal embolectomy.  Unknown reason for delay in presentation although according to family this has never appeared to completely heal.  May be related to stitch abscess.  There is no foreign material placed at the time of surgery and does not appear to communicate to the deep space.  Her left lower extremity does appear to be warm and well-perfused.  I have recommended transfer to [facility] with surgical drainage and likely closure by secondary intention of below-knee popliteal exposure wound.  Alternatives would be continued antibiotics and local wound care and more aggressively we could consider revascularization of her left lower extremity prior to any further surgical attempts which would require right to left femorofemoral bypass but given that leg appears well perfused I discussed attempting wound healing first.  Patient's daughter is in good understanding I will communicate the need for transfer to [facility] to the primary team.  Erlene Quan C. Donzetta Matters, MD [facility] Office: [phone] Pager: [phone]

## 2020-11-25 NOTE — Hospital Course (Addendum)
Chelsea Dean is a 87 yo female with PMH DMII, HTN, afib, diverticulosis, HLD, AAA who presented to the hospital with an ongoing left leg wound. She had previously undergone outpatient I&D along with an antibiotic course with minimal improvement. She therefore presented to the ER for further evaluation.  CT was obtained in the ER which showed a complex collection involving subcutaneous tissues at the medial aspect of the left lower leg measuring up to 6 cm.  Concern was for underlying residual abscess. Of note she had undergone thrombectomy in the left lower extremity in February 2022 with vascular surgery. She was started on antibiotics and underwent evaluation with vascular surgery.  She was recommended to undergo surgical drainage of her wound.

## 2020-11-25 NOTE — Consult Note (Signed)
Reason for Consult:left leg wound, not healing Referring Physician: Sabino Gasser MD  Chelsea Dean is an 87 y.o. female.  HPI: 87 year old female with PAD aortic aneurysm stenting.  Interventional radiology with remote stent graft repair at [facility] with aortobiiliac stent graft.  She had progressive sac enlargement and underwent prior coiling.  Patient had coiling of IMA branches and at completion there was noted ischemia of the foot and Dr. Sherren Mocha early vascular surgery was consulted and patient underwent left leg embolectomy with below-knee popliteal exposure and removal of left femoral sheath with direct exposure.  This was done on 04/01/2020.  Patient has incision for popliteal exposure below the knee medially and inferior edge of the incisions at slow healing.  Daughters at the bedside gives additional history and has been doing dry dressing changes.  She was seen in emergency room and this was debrided approach by the ER staff since then patient's had continued drainage serous.    Past Medical History:  Diagnosis Date   A-fib ([place]) 10/19/2016   Abdominal aortic aneurysm (AAA) without rupture 09/15/2013   Acute idiopathic gout of right foot    AKI (acute kidney injury) ([place]) 10/18/2016   Anticoagulated on Coumadin    Arthritis    Diabetes mellitus without complication ([place])    Diverticular disease of large intestine 04/16/2013   Diverticulosis    DM2 (diabetes mellitus, type 2) ([place]) 10/19/2016   Dysrhythmia    Atrial fib   History of colon polyps 04/16/2013   Hx of colonic polyps    Hyperlipidemia    Hypertension    Long term current use of anticoagulant therapy 02/05/2015   Lumbar radiculopathy 03/28/2014   Microalbuminuria 04/16/2013   Osteoarthritis 04/16/2013   Osteoarthritis of cervical spine 03/28/2014   Pelvic mass 10/19/2016   Right renal mass    SBO (small bowel obstruction) ([place]) 10/18/2016   Thrombocytopenia ([place]) 04/16/2013   Overview:  referred to hematology 01/2104     Past Surgical History:  Procedure Laterality Date   ABDOMINAL AORTIC ANEURYSM REPAIR     ABDOMINAL HYSTERECTOMY     IR ANGIOGRAM EXTREMITY LEFT  04/01/2020   IR ANGIOGRAM PELVIS SELECTIVE OR SUPRASELECTIVE  04/01/2020   IR ANGIOGRAM SELECTIVE EACH ADDITIONAL VESSEL  04/01/2020   IR ANGIOGRAM SELECTIVE EACH ADDITIONAL VESSEL  04/01/2020   IR ANGIOGRAM SELECTIVE EACH ADDITIONAL VESSEL  04/01/2020   IR AORTAGRAM ABDOMINAL SERIALOGRAM  04/01/2020   IR EMBO ARTERIAL NOT HEMORR HEMANG INC GUIDE ROADMAPPING  04/01/2020   IR GENERIC HISTORICAL  07/01/2015   IR RADIOLOGIST EVAL & MGMT 07/01/2015 [facility] INTERV RAD   IR RADIOLOGIST EVAL & MGMT  03/11/2020   IR RADIOLOGIST EVAL & MGMT  04/30/2020   IR THROMBECT PRIM MECH INIT (INCLU) MOD SED  04/01/2020   IR US GUIDE VASC ACCESS LEFT  04/01/2020   IR US GUIDE VASC ACCESS LEFT  04/01/2020   IR US GUIDE VASC ACCESS RIGHT  04/01/2020   THROMBECTOMY FEMORAL ARTERY Left 04/01/2020   Procedure: THROMBECTOMY LEFT POPLITEAL ARTERY;  Surgeon: Rosetta Posner, MD;  Location: [facility] OR;  Service: Vascular;  Laterality: Left;    Family History  Problem Relation Age of Onset   Cancer Sister        Rectal and Stomach    Social History:  reports that she has never smoked. Her smokeless tobacco use includes snuff. She reports that she does not drink alcohol and does not use drugs.  Allergies:  Allergies  Allergen Reactions  Ace Inhibitors Shortness Of Breath and Rash   Contrast Media [Iodinated Diagnostic Agents] Shortness Of Breath and Rash   Nsaids     Unknown reaction    Medications: I have reviewed the patient's current medications.  Results for orders placed or performed during the hospital encounter of 11/24/20 (from the past 48 hour(s))  CBC with Differential     Status: Abnormal   Collection Time: 11/24/20  1:29 PM  Result Value Ref Range   WBC 9.2 4.0 - 10.5 K/uL   RBC 4.44 3.87 - 5.11 MIL/uL   Hemoglobin 11.7 (L) 12.0 - 15.0 g/dL   HCT 37.9 36.0 -  46.0 %   MCV 85.4 80.0 - 100.0 fL   MCH 26.4 26.0 - 34.0 pg   MCHC 30.9 30.0 - 36.0 g/dL   RDW 14.2 11.5 - 15.5 %   Platelets 177 150 - 400 K/uL   nRBC 0.0 0.0 - 0.2 %   Neutrophils Relative % 80 %   Neutro Abs 7.4 1.7 - 7.7 K/uL   Lymphocytes Relative 7 %   Lymphs Abs 0.6 (L) 0.7 - 4.0 K/uL   Monocytes Relative 11 %   Monocytes Absolute 1.0 0.1 - 1.0 K/uL   Eosinophils Relative 1 %   Eosinophils Absolute 0.1 0.0 - 0.5 K/uL   Basophils Relative 0 %   Basophils Absolute 0.0 0.0 - 0.1 K/uL   Immature Granulocytes 1 %   Abs Immature Granulocytes 0.09 (H) 0.00 - 0.07 K/uL    Comment: Performed at [facility], [address] [place], [place] [address]  Comprehensive metabolic panel     Status: Abnormal   Collection Time: 11/24/20  1:29 PM  Result Value Ref Range   Sodium 136 135 - 145 mmol/L   Potassium 3.6 3.5 - 5.1 mmol/L   Chloride 100 98 - 111 mmol/L   CO2 25 22 - 32 mmol/L   Glucose, Bld 131 (H) 70 - 99 mg/dL    Comment: Glucose reference range applies only to samples taken after fasting for at least 8 hours.   BUN 14 8 - 23 mg/dL   Creatinine, Ser 0.98 0.44 - 1.00 mg/dL   Calcium 10.2 8.9 - 10.3 mg/dL   Total Protein 7.7 6.5 - 8.1 g/dL   Albumin 3.6 3.5 - 5.0 g/dL   AST 21 15 - 41 U/L   ALT 16 0 - 44 U/L   Alkaline Phosphatase 51 38 - 126 U/L   Total Bilirubin 0.6 0.3 - 1.2 mg/dL   GFR, Estimated 54 (L) >60 mL/min    Comment: (NOTE) Calculated using the CKD-EPI Creatinine Equation (2021)    Anion gap 11 5 - 15    Comment: Performed at [facility], [address] [place], [place] [address]  Lactic acid, plasma     Status: None   Collection Time: 11/24/20  3:11 PM  Result Value Ref Range   Lactic Acid, Venous 1.9 0.5 - 1.9 mmol/L    Comment: Performed at [facility], [address] [place], [place] [address]  Resp Panel by RT-PCR (Flu A&B, Covid) Nasopharyngeal Swab     Status: None   Collection Time: 11/24/20  3:51 PM    Specimen: Nasopharyngeal Swab; Nasopharyngeal(NP) swabs in vial transport medium  Result Value Ref Range   SARS Coronavirus 2 by RT PCR NEGATIVE NEGATIVE    Comment: (NOTE) SARS-CoV-2 target nucleic acids are NOT DETECTED.  The SARS-CoV-2 RNA is  generally detectable in upper respiratory specimens during the acute phase of infection. The lowest concentration of SARS-CoV-2 viral copies this assay can detect is 138 copies/mL. A negative result does not preclude SARS-Cov-2 infection and should not be used as the sole basis for treatment or other patient management decisions. A negative result may occur with  improper specimen collection/handling, submission of specimen other than nasopharyngeal swab, presence of viral mutation(s) within the areas targeted by this assay, and inadequate number of viral copies(<138 copies/mL). A negative result must be combined with clinical observations, patient history, and epidemiological information. The expected result is Negative.  Fact Sheet for Patients:  EntrepreneurPulse.com.au  Fact Sheet for Healthcare Providers:  IncredibleEmployment.be  This test is no t yet approved or cleared by the [place] FDA and  has been authorized for detection and/or diagnosis of SARS-CoV-2 by FDA under an Emergency Use Authorization (EUA). This EUA will remain  in effect (meaning this test can be used) for the duration of the COVID-19 declaration under Section 564(b)(1) of the Act, 21 U.S.C.section 360bbb-3(b)(1), unless the authorization is terminated  or revoked sooner.       Influenza A by PCR NEGATIVE NEGATIVE   Influenza B by PCR NEGATIVE NEGATIVE    Comment: (NOTE) The Xpert Xpress SARS-CoV-2/FLU/RSV plus assay is intended as an aid in the diagnosis of influenza from Nasopharyngeal swab specimens and should not be used as a sole basis for treatment. Nasal washings and aspirates are unacceptable for Xpert Xpress  SARS-CoV-2/FLU/RSV testing.  Fact Sheet for Patients: EntrepreneurPulse.com.au  Fact Sheet for Healthcare Providers: IncredibleEmployment.be  This test is not yet approved or cleared by the [place] FDA and has been authorized for detection and/or diagnosis of SARS-CoV-2 by FDA under an Emergency Use Authorization (EUA). This EUA will remain in effect (meaning this test can be used) for the duration of the COVID-19 declaration under Section 564(b)(1) of the Act, 21 U.S.C. section 360bbb-3(b)(1), unless the authorization is terminated or revoked.  Performed at [facility], [address] [place], [place] [address]   Hemoglobin A1c     Status: Abnormal   Collection Time: 11/24/20 11:55 PM  Result Value Ref Range   Hgb A1c MFr Bld 5.7 (H) 4.8 - 5.6 %    Comment: (NOTE) Pre diabetes:          5.7%-6.4%  Diabetes:              >6.4%  Glycemic control for   <7.0% adults with diabetes    Mean Plasma Glucose 116.89 mg/dL    Comment: Performed at [place] [address] [place], [place] [address]  CBC     Status: Abnormal   Collection Time: 11/24/20 11:55 PM  Result Value Ref Range   WBC 6.8 4.0 - 10.5 K/uL   RBC 4.18 3.87 - 5.11 MIL/uL   Hemoglobin 11.1 (L) 12.0 - 15.0 g/dL   HCT 35.6 (L) 36.0 - 46.0 %   MCV 85.2 80.0 - 100.0 fL   MCH 26.6 26.0 - 34.0 pg   MCHC 31.2 30.0 - 36.0 g/dL   RDW 14.3 11.5 - 15.5 %   Platelets 160 150 - 400 K/uL   nRBC 0.0 0.0 - 0.2 %    Comment: Performed at [facility], [place] [address], [place], [place] [address]  Creatinine, serum     Status: Abnormal   Collection Time: 11/24/20 11:55 PM  Result Value Ref Range   Creatinine,  Ser 0.98 0.44 - 1.00 mg/dL   GFR, Estimated 54 (L) >60 mL/min    Comment: (NOTE) Calculated using the CKD-EPI Creatinine Equation (2021) Performed at [facility], [place] [address], [place], [place] [address]    Comprehensive metabolic panel     Status: Abnormal   Collection Time: 11/24/20 11:55 PM  Result Value Ref Range   Sodium 137 135 - 145 mmol/L   Potassium 3.2 (L) 3.5 - 5.1 mmol/L   Chloride 101 98 - 111 mmol/L   CO2 26 22 - 32 mmol/L   Glucose, Bld 119 (H) 70 - 99 mg/dL    Comment: Glucose reference range applies only to samples taken after fasting for at least 8 hours.   BUN 14 8 - 23 mg/dL   Creatinine, Ser 0.97 0.44 - 1.00 mg/dL   Calcium 9.9 8.9 - 10.3 mg/dL   Total Protein 6.8 6.5 - 8.1 g/dL   Albumin 3.2 (L) 3.5 - 5.0 g/dL   AST 19 15 - 41 U/L   ALT 15 0 - 44 U/L   Alkaline Phosphatase 41 38 - 126 U/L   Total Bilirubin 0.8 0.3 - 1.2 mg/dL   GFR, Estimated 55 (L) >60 mL/min    Comment: (NOTE) Calculated using the CKD-EPI Creatinine Equation (2021)    Anion gap 10 5 - 15    Comment: Performed at [facility], [place] [address], [place], [place] [address]  Glucose, capillary     Status: Abnormal   Collection Time: 11/25/20 12:52 AM  Result Value Ref Range   Glucose-Capillary 104 (H) 70 - 99 mg/dL    Comment: Glucose reference range applies only to samples taken after fasting for at least 8 hours.  Glucose, capillary     Status: None   Collection Time: 11/25/20  7:45 AM  Result Value Ref Range   Glucose-Capillary 84 70 - 99 mg/dL    Comment: Glucose reference range applies only to samples taken after fasting for at least 8 hours.   Comment 1 Notify RN     DG Tibia/Fibula Left  Result Date: 11/24/2020 CLINICAL DATA:  Open wound medial calf and lower posterior calf EXAM: LEFT TIBIA AND FIBULA - 2 VIEW COMPARISON:  02/08/2020 FINDINGS: Frontal and lateral views of the left tibia and fibula are obtained. There is extensive soft tissue edema throughout the entire visualized left lower leg. No evidence of subcutaneous emphysema. There are no acute or destructive bony lesions. Stable osteoarthritis of the left knee and ankle. IMPRESSION: 1. Diffuse soft tissue  edema.  No evidence of subcutaneous gas. 2. No acute or destructive bony lesions. 3. Stable osteoarthritis. Electronically Signed   By: Randa Ngo M.D.   On: 11/24/2020 15:14   CT Tibia Fibula Left Wo Contrast  Result Date: 11/24/2020 CLINICAL DATA:  History of left lower leg abscess status post incision and drainage on 11/21/2020 EXAM: CT OF THE LOWER LEFT EXTREMITY WITHOUT CONTRAST TECHNIQUE: Multidetector CT imaging of the lower left extremity was performed according to the standard protocol. COMPARISON:  Ultrasound 11/17/2020.  X-ray 11/24/2020 FINDINGS: Bones/Joint/Cartilage No acute fracture. No dislocation. No bony erosion or periosteal elevation. Severe tricompartmental osteoarthritis of the left knee. Mild degenerative changes at the left ankle. No lytic or sclerotic bone lesion. Ligaments Suboptimally assessed by CT. Muscles and Tendons No acute musculotendinous abnormality by CT. No intramuscular fluid collection. Soft tissues Complex collection within the subcutaneous soft tissues along the medial aspect of the lower leg at the level  of the proximal tibial diaphysis measuring approximately 6.0 x 2.9 x 3.1 cm (series 8, image 122). Density of this collection is greater than than simple fluid. There is stranding within the adjacent subcutaneous fat. Collection abuts the overlying skin surface with area of probable prior incision along its inferior margin. Overlying skin thickening. No gas within the collection. There is subcutaneous edema throughout the lower leg, more pronounced distally. No soft tissue gas. Prominent atherosclerotic vascular calcifications. IMPRESSION: 1. Complex collection within the subcutaneous soft tissues along the medial aspect of the left lower leg measuring up to 6.0 cm, most likely representing residual inflammatory collection given history of recent abscess drainage. 2. No acute osseous abnormality. No bony erosion or periosteal elevation to suggest osteomyelitis. 3.  Diffuse subcutaneous edema throughout the lower leg, which may reflect cellulitis versus dependent change, although findings appear asymmetric compared with the included portion of the contralateral right lower extremity. No soft tissue gas. 4. Severe tricompartmental osteoarthritis of the left knee. Electronically Signed   By: Davina Poke D.O.   On: 11/24/2020 16:32    ROS patient is on chronic Eliquis.  All other review of systems noncontributory.  Patient is a household ambulator she likes to sit outside. Blood pressure 130/70, pulse 92, temperature 98.9 F (37.2 C), temperature source Oral, resp. rate 20, height 5\' 2"  (1.575 m), weight 74.8 kg, SpO2 96 %. Physical Exam Constitutional:      Appearance: Normal appearance.  HENT:     Head: Normocephalic.     Right Ear: External ear normal.     Left Ear: External ear normal.  Eyes:     Extraocular Movements: Extraocular movements intact.  Pulmonary:     Effort: Pulmonary effort is normal.  Abdominal:     General: Abdomen is flat.  Musculoskeletal:     Cervical back: Normal range of motion.  Skin:    General: Skin is warm.     Capillary Refill: Capillary refill takes less than 2 seconds.  Neurological:     Mental Status: She is alert.  Psychiatric:        Mood and Affect: Mood normal.        Behavior: Behavior normal.  Left foot is warm.  Serosanguineous drainage noted and dressing is changed new dry 4 x 4 and Kerlix applied.  Assessment/Plan: Postop thrombectomy popliteal artery incision with slow healing.  Discussed with daughter and patient this is likely related to less than ideal arterial supply to the leg.  She does not have any significant cellulitis and I discussed continue dressing changes as appropriate.  This is a postop wound from vascular and they can see patient in address.  I am available if needed.  My cell phone [phone].  Marybelle Killings 11/25/2020, 10:49 AM

## 2020-11-25 NOTE — Assessment & Plan Note (Signed)
-   hold Eliquis for now; can resume post-op as cleared by surgery - continue PRN lopressor

## 2020-11-25 NOTE — Assessment & Plan Note (Signed)
-   CT shows ~6cm collection; concern is a stitch abscess from prior surgery in Feb 2022; patient is minimally ambulatory at baseline since surgery - appreciate vascular surgery evaluation; [facility] initiated for tentative surgery on 10/12 - NPO at [facility] - continue abx for now; de-escalate as able; follow up any OR cultures

## 2020-11-26 ENCOUNTER — Inpatient Hospital Stay (HOSPITAL_COMMUNITY): Payer: Medicare Other | Admitting: Anesthesiology

## 2020-11-26 ENCOUNTER — Encounter (HOSPITAL_COMMUNITY)
Admission: EM | Disposition: A | Discharge status: Home / Self Care | Payer: Self-pay | Source: Home / Self Care | Attending: Internal Medicine

## 2020-11-26 ENCOUNTER — Encounter (HOSPITAL_COMMUNITY): Payer: Self-pay | Admitting: Internal Medicine

## 2020-11-26 DIAGNOSIS — T8142XA Infection following a procedure, deep incisional surgical site, initial encounter: Secondary | ICD-10-CM

## 2020-11-26 DIAGNOSIS — T82868A Thrombosis of vascular prosthetic devices, implants and grafts, initial encounter: Secondary | ICD-10-CM

## 2020-11-26 DIAGNOSIS — L03116 Cellulitis of left lower limb: Secondary | ICD-10-CM

## 2020-11-26 HISTORY — PX: APPLICATION OF WOUND VAC: SHX5189

## 2020-11-26 HISTORY — PX: INCISION AND DRAINAGE: SHX5863

## 2020-11-26 LAB — CBC WITH DIFFERENTIAL/PLATELET
Abs Immature Granulocytes: 0.09 10*3/uL — ABNORMAL HIGH (ref 0.00–0.07)
Basophils Absolute: 0 10*3/uL (ref 0.0–0.1)
Basophils Relative: 0 %
Eosinophils Absolute: 0.2 10*3/uL (ref 0.0–0.5)
Eosinophils Relative: 2 %
HCT: 34.5 % — ABNORMAL LOW (ref 36.0–46.0)
Hemoglobin: 10.3 g/dL — ABNORMAL LOW (ref 12.0–15.0)
Immature Granulocytes: 1 %
Lymphocytes Relative: 9 %
Lymphs Abs: 0.6 10*3/uL — ABNORMAL LOW (ref 0.7–4.0)
MCH: 25.8 pg — ABNORMAL LOW (ref 26.0–34.0)
MCHC: 29.9 g/dL — ABNORMAL LOW (ref 30.0–36.0)
MCV: 86.5 fL (ref 80.0–100.0)
Monocytes Absolute: 0.9 10*3/uL (ref 0.1–1.0)
Monocytes Relative: 14 %
Neutro Abs: 4.9 10*3/uL (ref 1.7–7.7)
Neutrophils Relative %: 74 %
Platelets: 165 10*3/uL (ref 150–400)
RBC: 3.99 MIL/uL (ref 3.87–5.11)
RDW: 14.2 % (ref 11.5–15.5)
WBC: 6.7 10*3/uL (ref 4.0–10.5)
nRBC: 0 % (ref 0.0–0.2)

## 2020-11-26 LAB — BASIC METABOLIC PANEL
Anion gap: 11 (ref 5–15)
BUN: 9 mg/dL (ref 8–23)
CO2: 28 mmol/L (ref 22–32)
Calcium: 10.1 mg/dL (ref 8.9–10.3)
Chloride: 104 mmol/L (ref 98–111)
Creatinine, Ser: 0.85 mg/dL (ref 0.44–1.00)
GFR, Estimated: >60 mL/min (ref >60)
Glucose, Bld: 106 mg/dL — ABNORMAL HIGH (ref 70–99)
Potassium: 3.5 mmol/L (ref 3.5–5.1)
Sodium: 143 mmol/L (ref 135–145)

## 2020-11-26 LAB — GLUCOSE, CAPILLARY
Glucose-Capillary: 109 mg/dL — ABNORMAL HIGH (ref 70–99)
Glucose-Capillary: 103 mg/dL — ABNORMAL HIGH (ref 70–99)
Glucose-Capillary: 163 mg/dL — ABNORMAL HIGH (ref 70–99)
Glucose-Capillary: 228 mg/dL — ABNORMAL HIGH (ref 70–99)

## 2020-11-26 LAB — MAGNESIUM: Magnesium: 1.4 mg/dL — ABNORMAL LOW (ref 1.7–2.4)

## 2020-11-26 SURGERY — INCISION AND DRAINAGE
Anesthesia: General | Site: Leg Lower | Laterality: Left

## 2020-11-26 MED ORDER — DEXAMETHASONE SODIUM PHOSPHATE 4 MG/ML IJ SOLN
INTRAMUSCULAR | Status: DC | PRN
  Administered 2020-11-26: 4 mg via INTRAVENOUS

## 2020-11-26 MED ORDER — CHLORHEXIDINE GLUCONATE 0.12 % MT SOLN: 15.0000 mL | Freq: Once | OROMUCOSAL | Status: AC

## 2020-11-26 MED ORDER — ONDANSETRON HCL 4 MG/2ML IJ SOLN
INTRAMUSCULAR | Status: AC
  Filled 2020-11-26: qty 2

## 2020-11-26 MED ORDER — ATORVASTATIN CALCIUM 10 MG PO TABS
10.0000 mg | ORAL_TABLET | Freq: Every day | ORAL | Status: DC
  Administered 2020-11-26 – 2020-11-28 (×3): 10 mg via ORAL
  Filled 2020-11-26 (×3): qty 1

## 2020-11-26 MED ORDER — FENTANYL CITRATE (PF) 100 MCG/2ML IJ SOLN: 25.0000 ug | INTRAMUSCULAR | Status: DC | PRN

## 2020-11-26 MED ORDER — ACETAMINOPHEN 10 MG/ML IV SOLN: 1000.0000 mg | Freq: Once | INTRAVENOUS | Status: AC

## 2020-11-26 MED ORDER — ONDANSETRON HCL 4 MG/2ML IJ SOLN
INTRAMUSCULAR | Status: DC | PRN
  Administered 2020-11-26: 4 mg via INTRAVENOUS

## 2020-11-26 MED ORDER — OXYCODONE HCL 5 MG PO TABS: 5.0000 mg | ORAL_TABLET | Freq: Four times a day (QID) | ORAL | Status: DC | PRN

## 2020-11-26 MED ORDER — LACTATED RINGERS IV SOLN: INTRAVENOUS | Status: DC

## 2020-11-26 MED ORDER — FENTANYL CITRATE (PF) 100 MCG/2ML IJ SOLN
INTRAMUSCULAR | Status: DC | PRN
  Administered 2020-11-26: 50 ug via INTRAVENOUS

## 2020-11-26 MED ORDER — DEXAMETHASONE SODIUM PHOSPHATE 10 MG/ML IJ SOLN
INTRAMUSCULAR | Status: AC
  Filled 2020-11-26: qty 1

## 2020-11-26 MED ORDER — AMLODIPINE BESYLATE 10 MG PO TABS
10.0000 mg | ORAL_TABLET | Freq: Every day | ORAL | Status: DC
  Administered 2020-11-27 – 2020-11-28 (×2): 10 mg via ORAL
  Filled 2020-11-26 (×2): qty 1

## 2020-11-26 MED ORDER — METOPROLOL TARTRATE 100 MG PO TABS
100.0000 mg | ORAL_TABLET | Freq: Two times a day (BID) | ORAL | Status: DC
  Administered 2020-11-26 – 2020-11-28 (×4): 100 mg via ORAL
  Filled 2020-11-26 (×4): qty 1

## 2020-11-26 MED ORDER — MAGNESIUM SULFATE 2 GM/50ML IV SOLN: 2.0000 g | Freq: Once | INTRAVENOUS | Status: DC

## 2020-11-26 MED ORDER — 0.9 % SODIUM CHLORIDE (POUR BTL) OPTIME
TOPICAL | Status: DC | PRN
  Administered 2020-11-26: 1000 mL

## 2020-11-26 MED ORDER — FENTANYL CITRATE (PF) 250 MCG/5ML IJ SOLN
INTRAMUSCULAR | Status: AC
  Filled 2020-11-26: qty 5

## 2020-11-26 MED ORDER — ORAL CARE MOUTH RINSE: 15.0000 mL | Freq: Once | OROMUCOSAL | Status: AC

## 2020-11-26 MED ORDER — LIDOCAINE 2% (20 MG/ML) 5 ML SYRINGE
INTRAMUSCULAR | Status: AC
  Filled 2020-11-26: qty 5

## 2020-11-26 MED ORDER — PROPOFOL 10 MG/ML IV BOLUS
INTRAVENOUS | Status: DC | PRN
  Administered 2020-11-26: 100 mg via INTRAVENOUS
  Administered 2020-11-26: 60 mg via INTRAVENOUS

## 2020-11-26 MED ORDER — PHENYLEPHRINE 40 MCG/ML (10ML) SYRINGE FOR IV PUSH (FOR BLOOD PRESSURE SUPPORT)
PREFILLED_SYRINGE | INTRAVENOUS | Status: AC
  Filled 2020-11-26: qty 10

## 2020-11-26 MED ORDER — CHLORHEXIDINE GLUCONATE 0.12 % MT SOLN
OROMUCOSAL | Status: AC
  Administered 2020-11-26: 15 mL via OROMUCOSAL
  Filled 2020-11-26: qty 15

## 2020-11-26 MED ORDER — ACETAMINOPHEN 10 MG/ML IV SOLN
INTRAVENOUS | Status: AC
  Administered 2020-11-26: 1000 mg via INTRAVENOUS
  Filled 2020-11-26: qty 100

## 2020-11-26 MED ORDER — ESMOLOL HCL 100 MG/10ML IV SOLN
INTRAVENOUS | Status: DC | PRN
  Administered 2020-11-26: 20 mg via INTRAVENOUS
  Administered 2020-11-26: 30 mg via INTRAVENOUS

## 2020-11-26 MED ORDER — PHENYLEPHRINE HCL (PRESSORS) 10 MG/ML IV SOLN
INTRAVENOUS | Status: DC | PRN
  Administered 2020-11-26: 80 ug via INTRAVENOUS

## 2020-11-26 MED ORDER — LIDOCAINE 2% (20 MG/ML) 5 ML SYRINGE
INTRAMUSCULAR | Status: DC | PRN
  Administered 2020-11-26: 3 mL via INTRAVENOUS

## 2020-11-26 MED ORDER — ESMOLOL HCL 100 MG/10ML IV SOLN
INTRAVENOUS | Status: AC
  Filled 2020-11-26: qty 10

## 2020-11-26 SURGICAL SUPPLY — 35 items
BAG COUNTER SPONGE SURGICOUNT (BAG) ×2 IMPLANT
BNDG ELASTIC 4X5.8 VLCR STR LF (GAUZE/BANDAGES/DRESSINGS) IMPLANT
BNDG ELASTIC 6X5.8 VLCR STR LF (GAUZE/BANDAGES/DRESSINGS) IMPLANT
BNDG GAUZE ELAST 4 BULKY (GAUZE/BANDAGES/DRESSINGS) IMPLANT
CANISTER SUCT 3000ML PPV (MISCELLANEOUS) ×2 IMPLANT
CANISTER WOUND CARE 500ML ATS (WOUND CARE) ×1 IMPLANT
CLEANER TIP ELECTROSURG 2X2 (MISCELLANEOUS) ×1 IMPLANT
DRAPE HALF SHEET 40X57 (DRAPES) IMPLANT
DRAPE INCISE IOBAN 66X45 STRL (DRAPES) IMPLANT
DRAPE ORTHO SPLIT 77X108 STRL (DRAPES)
DRAPE SURG ORHT 6 SPLT 77X108 (DRAPES) IMPLANT
DRSG VAC ATS SM SENSATRAC (GAUZE/BANDAGES/DRESSINGS) ×1 IMPLANT
ELECT REM PT RETURN 9FT ADLT (ELECTROSURGICAL) ×2
ELECTRODE REM PT RTRN 9FT ADLT (ELECTROSURGICAL) ×1 IMPLANT
GAUZE SPONGE 4X4 12PLY STRL (GAUZE/BANDAGES/DRESSINGS) ×2 IMPLANT
GLOVE SURG MICRO LTX SZ7 (GLOVE) ×1 IMPLANT
GLOVE SURG POLYISO LF SZ8 (GLOVE) ×2 IMPLANT
GLOVE SURG UNDER POLY LF SZ6.5 (GLOVE) ×1 IMPLANT
GOWN STRL REUS W/ TWL LRG LVL3 (GOWN DISPOSABLE) ×3 IMPLANT
GOWN STRL REUS W/TWL LRG LVL3 (GOWN DISPOSABLE) ×3
KIT BASIN OR (CUSTOM PROCEDURE TRAY) ×2 IMPLANT
KIT TURNOVER KIT B (KITS) ×2 IMPLANT
NS IRRIG 1000ML POUR BTL (IV SOLUTION) ×2 IMPLANT
PACK CV ACCESS (CUSTOM PROCEDURE TRAY) IMPLANT
PACK GENERAL/GYN (CUSTOM PROCEDURE TRAY) IMPLANT
PACK UNIVERSAL I (CUSTOM PROCEDURE TRAY) ×2 IMPLANT
PAD ARMBOARD 7.5X6 YLW CONV (MISCELLANEOUS) ×4 IMPLANT
SPONGE T-LAP 18X18 ~~LOC~~+RFID (SPONGE) ×1 IMPLANT
SUT ETHILON 3 0 PS 1 (SUTURE) IMPLANT
SUT MNCRL AB 4-0 PS2 18 (SUTURE) IMPLANT
SUT VIC AB 2-0 CT1 27 (SUTURE) ×1
SUT VIC AB 2-0 CT1 TAPERPNT 27 (SUTURE) ×1 IMPLANT
SUT VICRYL 4-0 PS2 18IN ABS (SUTURE) IMPLANT
TOWEL GREEN STERILE (TOWEL DISPOSABLE) ×2 IMPLANT
WATER STERILE IRR 1000ML POUR (IV SOLUTION) ×2 IMPLANT

## 2020-11-26 NOTE — Progress Notes (Signed)
PROGRESS NOTE    Chelsea Dean  OAC:[id number] DOB: [birth year] DOA: 11/24/2020 PCP: [name], MD    Brief Narrative:  Chelsea Dean is a 87 year old female with past medical history significant for type 2 diabetes mellitus, essential hypertension, paroxysmal atrial fibrillation, diverticulosis, hyperlipidemia, AAA who presented to [facility] ED on 10/10 for progressive left lower extremity cellulitis and abscess.  Patient instructed to return to the ED for further evaluation by her PCP.  Initially seen in the ED on 10/7 s/p I&D by EDP with 10 cc of blood/pus drained from the affected area.  Originally prescribed Keflex on 10/3.  In the ED, temperature 99.8 F, BP 145/73, HR 92, RR 30, SPO2 91% room air.  Sodium 136, potassium 3.6, chloride 100, CO2 25, glucose 131, BUN 14, creatinine 0.98.  AST 21, ALT 16, total bilirubin 0.6.  WBC 9.2, hemoglobin 11.7, platelets 177.  Lactic acid 1.9.  Cova-19 PCR negative.  Influenza A/B PCR negative.  CT left lower extremity with complex collection subcutaneous soft tissues along the medial aspect of the left lower leg measuring 6 cm without bony involvement but evidence of cellulitis and likely underlying abscess.  Patient was started on empiric antibiotics.  TRH consulted for further evaluation and management of progressive left lower extremity cellulitis with abscess with failed outpatient antibiotics.  Patient was transferred to [facility] for vascular surgery evaluation.   Assessment & Plan:   Principal Problem:   Abscess of left leg Active Problems:   A-fib (HCC)   Type 2 diabetes mellitus without complication, without long-term current use of insulin (HCC)   Hyperlipidemia   Essential hypertension   Left lower extremity cellulitis with underlying abscess Patient initially started antibiotic treatment with Keflex on 10/3, continue with progressive symptoms including swelling, pain, edema.  Patient underwent I&D by EDP on 10/3 (no fluid  was sent for culture); but despite drainage, symptoms continue to progress.  CT left lower extremity with 6 cm fluid collection concerning for abscess.  Vascular surgery was consulted and patient underwent incision and drainage of left lower extremity abscess with placement of a wound VAC by Dr. Stanford Breed on 11/26/2020. --Vascular surgery following, appreciate assistance --Continue wound VAC --Operative culture 10/12: Moderate WBCs, few GNR's; further pending --Vancomycin --Zosyn --Oxycodone 5 mg p.o. every 6 hours as needed moderate pain  Essential hypertension --Amlodipine 10 mg p.o. daily --Losartan 100mg  PO daily --Metoprolol tartrate 100 mg p.o. twice daily  Hyperlipidemia --Atorvastatin 10 mg p.o. daily  Type 2 diabetes mellitus On glipizide 10 mg p.o. daily at home.  Hemoglobin A1c 5.7 on 11/24/2020, well controlled. --SSI for coverage --CBGs qAC/HS  Paroxysmal atrial fibrillation --Metoprolol tartrate 100mg  PO BID --Continue to hold Eliquis, await vascular surgery recommendations for reinitiation postoperatively   DVT prophylaxis:    Code Status: Full Code Family Communication: Updated family present at bedside this evening  Disposition Plan:  Level of care: Med-Surg Status is: Inpatient  Remains inpatient appropriate because:Ongoing diagnostic testing needed not appropriate for outpatient work up, Unsafe d/c plan, IV treatments appropriate due to intensity of illness or inability to take PO, and Inpatient level of care appropriate due to severity of illness  Dispo: The patient is from: Home              Anticipated d/c is to: Home              Patient currently is not medically stable to d/c.   Difficult to place patient No    Consultants:  Vascular surgery  Procedures:  Incision and drainage, Dr. Stanford Breed 10/12  Antimicrobials:  Vancomycin 10/10>> Zosyn 10/10>>    Subjective: Patient seen examined at bedside, resting comfortably.  Just returned from the  PACU following incision and drainage of left lower extremity abscess.  Wound VAC in place.  Family updated at bedside.  Patient currently without any concerns.  Denies headache, no visual changes, no chest pain, no palpitations, no shortness of breath, no abdominal pain, no fever/chills/night sweats, no nausea/vomiting/diarrhea.  No acute events overnight per nursing staff.  Objective: Vitals:   11/26/20 1440 11/26/20 1555 11/26/20 1610 11/26/20 1713  BP: 128/68  128/84 137/89  Pulse: (!) 108 (!) 104 (!) 114 (!) 57  Resp: 13 20 (!) 23 20  Temp:  (!) 97 F (36.1 C)  98.2 F (36.8 C)  TempSrc:      SpO2: 100% 97% 99% 99%  Weight:      Height:        Intake/Output Summary (Last 24 hours) at 11/26/2020 1719 Last data filed at 11/26/2020 1600 Gross per 24 hour  Intake 3547.71 ml  Output 835 ml  Net 2712.71 ml   Filed Weights   11/24/20 1318  Weight: 74.8 kg    Examination:  General exam: Appears calm and comfortable  Respiratory system: Clear to auscultation. Respiratory effort normal. Cardiovascular system: S1 & S2 heard, irregularly irregular rhythm, normal rate. No JVD, murmurs, rubs, gallops or clicks. No pedal edema. Gastrointestinal system: Abdomen is nondistended, soft and nontender. No organomegaly or masses felt. Normal bowel sounds heard. Central nervous system: Alert and oriented. No focal neurological deficits. Extremities: Wound VAC noted to left lower extremity; symmetric 5 x 5 power. Skin: No rashes, lesions or ulcers Psychiatry: Judgement and insight appear normal. Mood & affect appropriate.     Data Reviewed: I have personally reviewed following labs and imaging studies  CBC: Recent Labs  Lab 11/24/20 1329 11/24/20 2355 11/26/20 0457  WBC 9.2 6.8 6.7  NEUTROABS 7.4  --  4.9  HGB 11.7* 11.1* 10.3*  HCT 37.9 35.6* 34.5*  MCV 85.4 85.2 86.5  PLT 177 160 016   Basic Metabolic Panel: Recent Labs  Lab 11/24/20 1329 11/24/20 2355 11/26/20 0457  NA  136 137 143  K 3.6 3.2* 3.5  CL 100 101 104  CO2 25 26 28   GLUCOSE 131* 119* 106*  BUN 14 14 9   CREATININE 0.98 0.98  0.97 0.85  CALCIUM 10.2 9.9 10.1  MG  --   --  1.4*   GFR: Estimated Creatinine Clearance: 40.8 mL/min (by C-G formula based on SCr of 0.85 mg/dL). Liver Function Tests: Recent Labs  Lab 11/24/20 1329 11/24/20 2355  AST 21 19  ALT 16 15  ALKPHOS 51 41  BILITOT 0.6 0.8  PROT 7.7 6.8  ALBUMIN 3.6 3.2*   No results for input(s): LIPASE, AMYLASE in the last 168 hours. No results for input(s): AMMONIA in the last 168 hours. Coagulation Profile: No results for input(s): INR, PROTIME in the last 168 hours. Cardiac Enzymes: No results for input(s): CKTOTAL, CKMB, CKMBINDEX, TROPONINI in the last 168 hours. BNP (last 3 results) No results for input(s): PROBNP in the last 8760 hours. HbA1C: Recent Labs    11/24/20 2355  HGBA1C 5.7*   CBG: Recent Labs  Lab 11/25/20 1707 11/25/20 2109 11/26/20 0903 11/26/20 1126 11/26/20 1709  GLUCAP 85 102* 109* 103* 163*   Lipid Profile: No results for input(s): CHOL, HDL, LDLCALC, TRIG, CHOLHDL, LDLDIRECT in  the last 72 hours. Thyroid Function Tests: No results for input(s): TSH, T4TOTAL, FREET4, T3FREE, THYROIDAB in the last 72 hours. Anemia Panel: No results for input(s): VITAMINB12, FOLATE, FERRITIN, TIBC, IRON, RETICCTPCT in the last 72 hours. Sepsis Labs: Recent Labs  Lab 11/24/20 1511  LATICACIDVEN 1.9    Recent Results (from the past 240 hour(s))  Resp Panel by RT-PCR (Flu A&B, Covid) Nasopharyngeal Swab     Status: None   Collection Time: 11/24/20  3:51 PM   Specimen: Nasopharyngeal Swab; Nasopharyngeal(NP) swabs in vial transport medium  Result Value Ref Range Status   SARS Coronavirus 2 by RT PCR NEGATIVE NEGATIVE Final    Comment: (NOTE) SARS-CoV-2 target nucleic acids are NOT DETECTED.  The SARS-CoV-2 RNA is generally detectable in upper respiratory specimens during the acute phase of  infection. The lowest concentration of SARS-CoV-2 viral copies this assay can detect is 138 copies/mL. A negative result does not preclude SARS-Cov-2 infection and should not be used as the sole basis for treatment or other patient management decisions. A negative result may occur with  improper specimen collection/handling, submission of specimen other than nasopharyngeal swab, presence of viral mutation(s) within the areas targeted by this assay, and inadequate number of viral copies(<138 copies/mL). A negative result must be combined with clinical observations, patient history, and epidemiological information. The expected result is Negative.  Fact Sheet for Patients:  EntrepreneurPulse.com.au  Fact Sheet for Healthcare Providers:  IncredibleEmployment.be  This test is no t yet approved or cleared by the [place] FDA and  has been authorized for detection and/or diagnosis of SARS-CoV-2 by FDA under an Emergency Use Authorization (EUA). This EUA will remain  in effect (meaning this test can be used) for the duration of the COVID-19 declaration under Section 564(b)(1) of the Act, 21 U.S.C.section 360bbb-3(b)(1), unless the authorization is terminated  or revoked sooner.       Influenza A by PCR NEGATIVE NEGATIVE Final   Influenza B by PCR NEGATIVE NEGATIVE Final    Comment: (NOTE) The Xpert Xpress SARS-CoV-2/FLU/RSV plus assay is intended as an aid in the diagnosis of influenza from Nasopharyngeal swab specimens and should not be used as a sole basis for treatment. Nasal washings and aspirates are unacceptable for Xpert Xpress SARS-CoV-2/FLU/RSV testing.  Fact Sheet for Patients: EntrepreneurPulse.com.au  Fact Sheet for Healthcare Providers: IncredibleEmployment.be  This test is not yet approved or cleared by the [place] FDA and has been authorized for detection and/or diagnosis of SARS-CoV-2  by FDA under an Emergency Use Authorization (EUA). This EUA will remain in effect (meaning this test can be used) for the duration of the COVID-19 declaration under Section 564(b)(1) of the Act, 21 U.S.C. section 360bbb-3(b)(1), unless the authorization is terminated or revoked.  Performed at [facility], [address] [place], [place] [address]   Aerobic/Anaerobic Culture w Gram Stain (surgical/deep wound)     Status: None (Preliminary result)   Collection Time: 11/26/20 11:07 AM   Specimen: Leg, Left; Wound  Result Value Ref Range Status   Specimen Description WOUND  Final   Special Requests LEFT LEG WOUND SPEC A  Final   Gram Stain   Final    MODERATE WBC PRESENT,BOTH PMN AND MONONUCLEAR FEW GRAM NEGATIVE RODS Performed at [facility], [place] [address] [place], [place] [address]    Culture PENDING  Incomplete   Report Status PENDING  Incomplete         Radiology Studies: No results found.  Scheduled Meds:  insulin aspart  0-15 Units Subcutaneous TID WC   insulin aspart  0-5 Units Subcutaneous QHS   losartan  100 mg Oral Daily   Continuous Infusions:  sodium chloride Stopped (11/26/20 0643)   magnesium sulfate bolus IVPB     piperacillin-tazobactam (ZOSYN)  IV 3.375 g (11/26/20 0954)   vancomycin       LOS: 2 days    Time spent: 40 minutes spent on chart review, discussion with nursing staff, consultants, updating family and interview/physical exam; more than 50% of that time was spent in counseling and/or coordination of care.    Audry Pecina J [place] DO [facility] Available via Epic secure chat 7am-7pm After these hours, please refer to coverage provider listed on [email or web address] 11/26/2020, 5:19 PM

## 2020-11-26 NOTE — Transfer of Care (Signed)
Immediate Anesthesia Transfer of Care Note  Patient: Chelsea Dean  Procedure(s) Performed: INCISION AND DRAINAGE OF LEFT LEG (Left: Leg Lower)  Patient Location: PACU  Anesthesia Type:General  Level of Consciousness: awake, alert  and oriented  Airway & Oxygen Therapy: Patient Spontanous Breathing and Patient connected to face mask oxygen  Post-op Assessment: Report given to RN and Post -op Vital signs reviewed and stable  Post vital signs: Reviewed and stable  Last Vitals:  Vitals Value Taken Time  BP 120/72 11/26/20 1118  Temp    Pulse 111 11/26/20 1123  Resp 19 11/26/20 1123  SpO2 100 % 11/26/20 1123  Vitals shown include unvalidated device data.  Last Pain:  Vitals:   11/26/20 0907  TempSrc: Oral  PainSc:          Complications: No notable events documented.

## 2020-11-26 NOTE — Anesthesia Postprocedure Evaluation (Signed)
Anesthesia Post Note  Patient: Chelsea Dean  Procedure(s) Performed: INCISION AND DRAINAGE OF LEFT LEG (Left: Leg Lower) APPLICATION OF WOUND VAC (Left: Leg Lower)     Patient location during evaluation: PACU Anesthesia Type: General Level of consciousness: awake and alert Pain management: pain level controlled Vital Signs Assessment: post-procedure vital signs reviewed and stable Respiratory status: spontaneous breathing, nonlabored ventilation, respiratory function stable and patient connected to nasal cannula oxygen Cardiovascular status: blood pressure returned to baseline and stable Postop Assessment: no apparent nausea or vomiting Anesthetic complications: no   No notable events documented.  Last Vitals:  Vitals:   11/26/20 1240 11/26/20 1400  BP: 138/66   Pulse: (!) 105 (!) 106  Resp: 20 (!) 23  Temp:    SpO2: 99% 98%    Last Pain:  Vitals:   11/26/20 1400  TempSrc:   PainSc: [place]

## 2020-11-26 NOTE — Anesthesia Procedure Notes (Signed)
Procedure Name: LMA Insertion Date/Time: 11/26/2020 10:42 AM Performed by: Kerry Fort, CRNA Pre-anesthesia Checklist: Patient identified, Emergency Drugs available, Suction available and Patient being monitored Patient Re-evaluated:Patient Re-evaluated prior to induction Oxygen Delivery Method: Circle system utilized Preoxygenation: Pre-oxygenation with 100% oxygen Induction Type: IV induction Ventilation: Mask ventilation without difficulty and Oral airway inserted - appropriate to patient size LMA: LMA inserted LMA Size: 4.0 Number of attempts: 2

## 2020-11-26 NOTE — Interval H&P Note (Signed)
History and Physical Interval Note:  11/26/2020 10:03 AM  Chelsea Dean  has presented today for surgery, with the diagnosis of NON-HEALING SURGICAL WOUND.  The various methods of treatment have been discussed with the patient and family. After consideration of risks, benefits and other options for treatment, the patient has consented to  Procedure(s): INCISION AND DRAINAGE OF LEFT LEG (Left) as a surgical intervention.  The patient's history has been reviewed, patient examined, no change in status, stable for surgery.  I have reviewed the patient's chart and labs.  Questions were answered to the patient's satisfaction.     Cherre Robins

## 2020-11-26 NOTE — Anesthesia Preprocedure Evaluation (Addendum)
Anesthesia Evaluation  Patient identified by MRN, date of birth, ID band Patient awake    Reviewed: Allergy & Precautions, NPO status , Patient's Chart, lab work & pertinent test results  Airway Mallampati: II  TM Distance: >3 FB Neck ROM: Full    Dental  (+) Edentulous Upper, Edentulous Lower   Pulmonary neg pulmonary ROS,    Pulmonary exam normal        Cardiovascular hypertension, Pt. on medications and Pt. on home beta blockers + Peripheral Vascular Disease (AAA, on Eliquis for AF)  + dysrhythmias Atrial Fibrillation  Rhythm:Irregular Rate:Normal     Neuro/Psych negative neurological ROS  negative psych ROS   GI/Hepatic Neg liver ROS, Diverticulitis    Endo/Other  diabetes, Type 2, Oral Hypoglycemic Agents  Renal/GU negative Renal ROS  negative genitourinary   Musculoskeletal  (+) Arthritis , Left leg abscess   Abdominal (+)  Abdomen: soft. Bowel sounds: normal.  Peds  Hematology negative hematology ROS (+)   Anesthesia Other Findings   Reproductive/Obstetrics                           Anesthesia Physical Anesthesia Plan  ASA: 3  Anesthesia Plan: General   Post-op Pain Management:    Induction: Intravenous  PONV Risk Score and Plan: 3 and Ondansetron, Dexamethasone and Treatment may vary due to age or medical condition  Airway Management Planned: Mask and LMA  Additional Equipment: None  Intra-op Plan:   Post-operative Plan: Extubation in OR  Informed Consent: I have reviewed the patients History and Physical, chart, labs and discussed the procedure including the risks, benefits and alternatives for the proposed anesthesia with the patient or authorized representative who has indicated his/her understanding and acceptance.     Dental advisory given  Plan Discussed with: CRNA  Anesthesia Plan Comments: (Lab Results      Component                Value                Date                      WBC                      6.7                 11/26/2020                HGB                      10.3 (L)            11/26/2020                HCT                      34.5 (L)            11/26/2020                MCV                      86.5                11/26/2020                PLT  165                 11/26/2020           Lab Results      Component                Value               Date                      NA                       143                 11/26/2020                K                        3.5                 11/26/2020                CO2                      28                  11/26/2020                GLUCOSE                  106 (H)             11/26/2020                BUN                      9                   11/26/2020                CREATININE               0.85                11/26/2020                CALCIUM                  10.1                11/26/2020                GFRNONAA                 >60                 11/26/2020          )        Anesthesia Quick Evaluation

## 2020-11-26 NOTE — Op Note (Signed)
DATE OF SERVICE: 11/26/2020  PATIENT:  Chelsea Dean  87 y.o. female  PRE-OPERATIVE DIAGNOSIS:  abscess about left calf after popliteal thrombectomy 04/01/20  POST-OPERATIVE DIAGNOSIS:  Same  PROCEDURE:   1) incision and drainage of left calf 2) mechanical debridement of wound bed (6cm long x 1cm wide x 1.5cm deep) 3) initiation of negative pressure wound therapy (wound vac 6cm long x 1cm wide x 1.5cm deep)  SURGEON:  Surgeon(s) and Role:    * Cherre Robins, MD - Primary  ASSISTANT: none  An assistant was required to facilitate exposure and expedite the case.  ANESTHESIA:   general  EBL: min  BLOOD ADMINISTERED:none  DRAINS: none   LOCAL MEDICATIONS USED:  NONE  SPECIMEN:  wound culture  COUNTS: confirmed correct.  TOURNIQUET:  none   PATIENT DISPOSITION:  PACU - hemodynamically stable.   Delay start of Pharmacological VTE agent (>24hrs) due to surgical blood loss or risk of bleeding: no  INDICATION FOR PROCEDURE: Chelsea Dean is a 87 y.o. female with abscess in left medial calf which developed after left popliteal thrombectomy done in February 2022. After careful discussion of risks, benefits, and alternatives the patient was offered incision and drainage. The patient due to his understood and wished to proceed.  OPERATIVE FINDINGS: Abscess cavity measuring 6 cm long by 1 cm wide by 1.5 cm deep.  Cultures taken.  Wound bed mechanically debrided.  Healthy bleeding tissue encountered.  Wound VAC applied.  DESCRIPTION OF PROCEDURE: After identification of the patient in the pre-operative holding area, the patient was transferred to the operating room. The patient was positioned supine on the operating room table. Anesthesia was induced. The left calf was prepped and draped in standard fashion. A surgical pause was performed confirming correct patient, procedure, and operative location.  The previous popliteal thrombectomy incision was reopened sharply.  Cultures were  taken of the seropurulent fluid encountered.  Debris in the wound was evacuated.  Manual, mechanical debridement was performed of the wound cavity.  Healthy bleeding tissue was encountered.  The wound was copiously irrigated with a liter of saline. A wound VAC was brought onto the field.  The wound measured 6 cm long by 1 cm wide by 1-1/2 cm deep.  The vacuum was sealed and connected to suction and good seal achieved.  Upon completion of the case instrument and sharps counts were confirmed correct. The patient was transferred to the PACU in good condition. I was present for all portions of the procedure.  Yevonne Aline. Stanford Breed, MD [facility] Phone Number: [phone] 11/26/2020 11:04 AM

## 2020-11-27 ENCOUNTER — Encounter (HOSPITAL_COMMUNITY): Payer: Self-pay | Admitting: Vascular Surgery

## 2020-11-27 LAB — CBC WITH DIFFERENTIAL/PLATELET
Abs Immature Granulocytes: 0.07 10*3/uL (ref 0.00–0.07)
Basophils Absolute: 0 10*3/uL (ref 0.0–0.1)
Basophils Relative: 0 %
Eosinophils Absolute: 0 10*3/uL (ref 0.0–0.5)
Eosinophils Relative: 0 %
HCT: 33.6 % — ABNORMAL LOW (ref 36.0–46.0)
Hemoglobin: 10.2 g/dL — ABNORMAL LOW (ref 12.0–15.0)
Immature Granulocytes: 1 %
Lymphocytes Relative: 6 %
Lymphs Abs: 0.5 10*3/uL — ABNORMAL LOW (ref 0.7–4.0)
MCH: 25.8 pg — ABNORMAL LOW (ref 26.0–34.0)
MCHC: 30.4 g/dL (ref 30.0–36.0)
MCV: 85.1 fL (ref 80.0–100.0)
Monocytes Absolute: 0.7 10*3/uL (ref 0.1–1.0)
Monocytes Relative: 9 %
Neutro Abs: 6.7 10*3/uL (ref 1.7–7.7)
Neutrophils Relative %: 84 %
Platelets: 199 10*3/uL (ref 150–400)
RBC: 3.95 MIL/uL (ref 3.87–5.11)
RDW: 13.9 % (ref 11.5–15.5)
WBC: 7.9 10*3/uL (ref 4.0–10.5)
nRBC: 0 % (ref 0.0–0.2)

## 2020-11-27 LAB — BASIC METABOLIC PANEL
Anion gap: 8 (ref 5–15)
BUN: 16 mg/dL (ref 8–23)
CO2: 28 mmol/L (ref 22–32)
Calcium: 9.7 mg/dL (ref 8.9–10.3)
Chloride: 101 mmol/L (ref 98–111)
Creatinine, Ser: 0.94 mg/dL (ref 0.44–1.00)
GFR, Estimated: 57 mL/min — ABNORMAL LOW (ref >60)
Glucose, Bld: 169 mg/dL — ABNORMAL HIGH (ref 70–99)
Potassium: 3.9 mmol/L (ref 3.5–5.1)
Sodium: 137 mmol/L (ref 135–145)

## 2020-11-27 LAB — GLUCOSE, CAPILLARY
Glucose-Capillary: 131 mg/dL — ABNORMAL HIGH (ref 70–99)
Glucose-Capillary: 152 mg/dL — ABNORMAL HIGH (ref 70–99)
Glucose-Capillary: 125 mg/dL — ABNORMAL HIGH (ref 70–99)
Glucose-Capillary: 127 mg/dL — ABNORMAL HIGH (ref 70–99)

## 2020-11-27 LAB — MAGNESIUM: Magnesium: 1.5 mg/dL — ABNORMAL LOW (ref 1.7–2.4)

## 2020-11-27 MED ORDER — MAGNESIUM SULFATE 4 GM/100ML IV SOLN
4.0000 g | Freq: Once | INTRAVENOUS | Status: AC
  Administered 2020-11-27: 4 g via INTRAVENOUS
  Filled 2020-11-27: qty 100

## 2020-11-27 NOTE — Progress Notes (Addendum)
  Progress Note    11/27/2020 6:59 AM 1 Day Post-Op  Subjective:  denies any pain.    afebrile  Vitals:   11/27/20 0016 11/27/20 0508  BP: 132/71 (!) 143/83  Pulse: 88 76  Resp: 17 17  Temp: 98 F (36.7 C) 97.7 F (36.5 C)  SpO2: 100% 98%    Physical Exam: General:  no distress Lungs:  non labored Incisions:  left leg with wound vac in place with good seal. Extremities:  bilateral feet warm and equal bilaterally  CBC    Component Value Date/Time   WBC 7.9 11/27/2020 0250   RBC 3.95 11/27/2020 0250   HGB 10.2 (L) 11/27/2020 0250   HCT 33.6 (L) 11/27/2020 0250   PLT 199 11/27/2020 0250   MCV 85.1 11/27/2020 0250   MCH 25.8 (L) 11/27/2020 0250   MCHC 30.4 11/27/2020 0250   RDW 13.9 11/27/2020 0250   LYMPHSABS 0.5 (L) 11/27/2020 0250   MONOABS 0.7 11/27/2020 0250   EOSABS 0.0 11/27/2020 0250   BASOSABS 0.0 11/27/2020 0250    BMET    Component Value Date/Time   NA 137 11/27/2020 0250   K 3.9 11/27/2020 0250   CL 101 11/27/2020 0250   CO2 28 11/27/2020 0250   GLUCOSE 169 (H) 11/27/2020 0250   BUN 16 11/27/2020 0250   CREATININE 0.94 11/27/2020 0250   CALCIUM 9.7 11/27/2020 0250   GFRNONAA 57 (L) 11/27/2020 0250   GFRAA 51 (L) 04/01/2018 1854    INR    Component Value Date/Time   INR 1.4 (H) 04/01/2020 0634     Intake/Output Summary (Last 24 hours) at 11/27/2020 0659 Last data filed at 11/27/2020 0523 Gross per 24 hour  Intake 3586.45 ml  Output 385 ml  Net 3201.45 ml     Specimen Description WOUND   Special Requests LEFT LEG WOUND SPEC A   Gram Stain MODERATE WBC PRESENT,BOTH PMN AND MONONUCLEAR  FEW GRAM NEGATIVE RODS  Performed at [facility], [address] [place], [place] [address]   Culture PENDING   Report Status PENDING      Assessment/Plan:  87 y.o. female is s/p:  1) incision and drainage of left calf 2) mechanical debridement of wound bed (6cm long x 1cm wide x 1.5cm deep) 3) initiation of negative pressure wound  therapy (wound vac 6cm long x 1cm wide x 1.5cm deep)  1 Day Post-Op   -wound vac with good seal.   Change tomorrow.  -continue vanc/zosyn until final culture has resulted.     Leontine Locket, PA-C [facility] [phone] 11/27/2020 6:59 AM   I have independently interviewed and examined patient and agree with PA assessment and plan above. Wound vac change tomorrow and will need for home.   Shone Leventhal C. Donzetta Matters, MD [facility] [place] Office: [phone] Pager: [phone]

## 2020-11-27 NOTE — TOC Initial Note (Addendum)
Transition of Care [facility]) - Initial/Assessment Note    Patient Details  Name: Chelsea Dean MRN: [id number] Date of Birth: [birth year]  Transition of Care [facility]) CM/SW Contact:    Chelsea Favre, RN Phone Number: 11/27/2020, 10:23 AM  Clinical Narrative:                 Spoke to patient and daughter Chelsea Dean at bedside.   Confirmed face sheet information.   Patient from home alone, however at discharge she will be staying with Chelsea Dean at : [address] [facility], [place]    NCM discussed home VAC will be ordered through [facility] will deliver [facility] and supplies to hospital room prior to discharge. [employer] will discuss coverage, patient responsibility etc. Hospital nurse will connect patient to home VAC pump prior to discharge. They will take supplies home with them for [facility] .   PT recommending youth walker, NCM ordered youth walker with Chelsea Dean with [facility] with [facility] aware home VAC needed. Will message PA to sign home VAC application.   Chelsea Dean with [facility] checking to see if they have staffing to accept referral for [facility] and [facility] . Chelsea Dean unable to accept due to staffing.  Chelsea Dean with [facility] reviewing referral. Chelsea Dean with [facility] accepted [facility] PT referral   1409 VAC form needs signature, secure chatted PA   [address] Chelsea Dean office , they will page Chelsea Dean and her her to sign application.  Expected Discharge Plan: [place]     Patient Goals and CMS Choice Patient states their goals for this hospitalization and ongoing recovery are:: to return to home CMS Medicare.gov Compare Post Acute Care list provided to:: Patient Choice offered to / list presented to : Patient, Adult Children  Expected Discharge Plan and Services Expected Discharge Plan: [place]   Discharge Planning Services: CM Consult Post Acute Care Choice: Home Health, Durable Medical Equipment Living arrangements for the past 2 months: Single  Family Home                 DME Arranged: Negative pressure wound device, Walker rolling DME Agency: [employer], Chelsea Dean Date DME Agency Contacted: 11/27/20 Time DME Agency Contacted: [id number] Representative spoke with at DME Agency: Chelsea Dean with [employer], Chelsea Dean with [facility] HH Arranged: RN [place] Agency: [place] Date [place]: 11/27/20 Time [place]: 51 Representative spoke with at [place]: Chelsea Dean checking to see if they have staffing  Prior Living Arrangements/Services Living arrangements for the past 2 months: [place] with:: Self Patient language and need for interpreter reviewed:: Yes Do you feel safe going back to the place where you live?: Yes      Need for Family Participation in Patient Care: Yes (Comment) Care giver support system in place?: Yes (comment) Current home services: DME Criminal Activity/Legal Involvement Pertinent to Current Situation/Hospitalization: No - Comment as needed  Activities of Daily Living Home Assistive Devices/Equipment: Gilford Rile (specify type) ADL Screening (condition at time of admission) Patient's cognitive ability adequate to safely complete daily activities?: Yes Is the patient deaf or have difficulty hearing?: No Does the patient have difficulty seeing, even when wearing glasses/contacts?: No Does the patient have difficulty concentrating, remembering, or making decisions?: No Patient able to express need for assistance with ADLs?: Yes Does the patient have difficulty dressing or bathing?: Yes Independently performs ADLs?: No Communication: Independent Dressing (OT): Needs assistance Is this a change from  baseline?: Pre-admission baseline Grooming: Needs assistance Is this a change from baseline?: Pre-admission baseline Feeding: Appropriate for developmental age Bathing: Needs assistance Is this a change from baseline?: Pre-admission baseline Toileting: Needs assistance Is this a change from  baseline?: Pre-admission baseline In/Out Bed: Needs assistance Is this a change from baseline?: Pre-admission baseline Walks in Home: Needs assistance Is this a change from baseline?: Pre-admission baseline Does the patient have difficulty walking or climbing stairs?: Yes Weakness of Legs: Both Weakness of Arms/Hands: None  Permission Sought/Granted   Permission granted to share information with : Yes, Verbal Permission Granted  Share Information with NAME: Chelsea Dean daughter 44 [place] granted to share info w AGENCY: [facility]        Emotional Assessment Appearance:: Appears stated age Attitude/Demeanor/Rapport: Engaged Affect (typically observed): Accepting Orientation: : Oriented to Self, Oriented to Place, Oriented to  Time, Oriented to Situation Alcohol / Substance Use: Not Applicable Psych Involvement: No (comment)  Admission diagnosis:  Abscess [L02.91] Cellulitis and abscess of left lower extremity [L03.116, L02.416] Patient Active Problem List   Diagnosis Date Noted   Abscess of left leg 11/24/2020   Abscess 11/24/2020   Hyperlipidemia associated with type 2 diabetes mellitus ([place]) 06/06/2020   Ischemic leg 04/01/2020   Stage 4 chronic kidney disease ([place]) 09/21/2019   Elevated LDL cholesterol level 07/19/2018   Need for influenza vaccination 12/28/2017   Chronic pain of both knees 10/19/2017   Stage 3 chronic kidney disease ([place]) 07/25/2017   Hypomagnesemia 03/31/2017   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/31/2017   Abnormal urinalysis 03/16/2017   Hypokalemia 10/31/2016   Lactic acidosis 10/31/2016   Atrial fibrillation with RVR ([place]) 10/31/2016   Elevated liver enzymes 10/31/2016   Anticoagulated on Coumadin 10/31/2016   Elevated INR 10/31/2016   Hyperlipidemia 10/31/2016   Essential hypertension 10/31/2016   Acute idiopathic gout of right foot    A-fib ([place]) 10/19/2016   Type 2 diabetes mellitus without complication, without long-term current use of  insulin ([place]) 10/19/2016   Pelvic mass 10/19/2016   SBO (small bowel obstruction) ([place]) 10/18/2016   Long term current use of anticoagulant therapy 02/05/2015   Lumbar radiculopathy 03/28/2014   Osteoarthritis of cervical spine 03/28/2014   Abdominal aortic aneurysm (AAA) without rupture 09/15/2013   Diverticular disease of large intestine 04/16/2013   History of colon polyps 04/16/2013   Microalbuminuria 04/16/2013   Osteoarthritis 04/16/2013   Thrombocytopenia ([place]) 04/16/2013   PCP:  [name], MD Pharmacy:   [facility], [place] - [address] [place] [address] Phone: [phone] Fax: [phone]  [id number] - [place], [place] - [address] AT [place] [address] Phone: [phone] Fax: [facility] #[id number] - [place], [place] - [address] AT [address] [place] [address] Phone: [phone] Fax: [phone]     Social Determinants of Health (SDOH) Interventions    Readmission Risk Interventions No flowsheet data found.

## 2020-11-27 NOTE — Progress Notes (Signed)
PROGRESS NOTE    Chelsea Dean  NUU:[id number] DOB: 06/25/1927 DOA: 11/24/2020 PCP: [name], MD    Brief Narrative:  Chelsea Dean is a 87 year old female with past medical history significant for type 2 diabetes mellitus, essential hypertension, paroxysmal atrial fibrillation, diverticulosis, hyperlipidemia, AAA, and left leg ischemia 2/2 emolism s/p popliteal embolectomy by Dr. Donnetta Hutching Feb 2022 who presented to [facility] ED on 10/10 for progressive left lower extremity cellulitis and abscess.  Patient instructed to return to the ED for further evaluation by her PCP.  Initially seen in the ED on 10/7 s/p I&D by EDP with 10 cc of blood/pus drained from the affected area.  Originally prescribed Keflex on 10/3.  In the ED, temperature 99.8 F, BP 145/73, HR 92, RR 30, SPO2 91% room air.  Sodium 136, potassium 3.6, chloride 100, CO2 25, glucose 131, BUN 14, creatinine 0.98.  AST 21, ALT 16, total bilirubin 0.6.  WBC 9.2, hemoglobin 11.7, platelets 177.  Lactic acid 1.9.  Cova-19 PCR negative.  Influenza A/B PCR negative.  CT left lower extremity with complex collection subcutaneous soft tissues along the medial aspect of the left lower leg measuring 6 cm without bony involvement but evidence of cellulitis and likely underlying abscess.  Patient was started on empiric antibiotics.  TRH consulted for further evaluation and management of progressive left lower extremity cellulitis with abscess with failed outpatient antibiotics.  Patient was transferred to [facility] for vascular surgery evaluation.   Assessment & Plan:   Principal Problem:   Abscess of left leg Active Problems:   A-fib (HCC)   Type 2 diabetes mellitus without complication, without long-term current use of insulin (HCC)   Hyperlipidemia   Essential hypertension   Left lower extremity cellulitis with underlying abscess Patient initially started antibiotic treatment with Keflex on 10/3, continue with progressive symptoms  including swelling, pain, edema.  Patient underwent I&D by EDP on 10/3 (no fluid was sent for culture); but despite drainage, symptoms continue to progress.  CT left lower extremity with 6 cm fluid collection concerning for abscess.  Vascular surgery was consulted and patient underwent incision and drainage of left lower extremity abscess with placement of a wound VAC by Dr. Stanford Breed on 11/26/2020. --Vascular surgery following, appreciate assistance --Continue wound VAC; plan to change tomorrow --Operative culture 10/12: Moderate WBCs, few GNR's; further pending --Vancomycin --Zosyn --Oxycodone 5 mg p.o. q6h prn moderate pain  Essential hypertension --Amlodipine 10 mg p.o. daily --Losartan 100mg  PO daily --Metoprolol tartrate 100 mg p.o. twice daily  Hyperlipidemia --Atorvastatin 10 mg p.o. daily  Type 2 diabetes mellitus On glipizide 10 mg p.o. daily at home.  Hemoglobin A1c 5.7 on 11/24/2020, well controlled. --SSI for coverage --CBGs qAC/HS  Paroxysmal atrial fibrillation --Metoprolol tartrate 100mg  PO BID --Continue to hold Eliquis, await vascular surgery recommendations for reinitiation postoperatively   DVT prophylaxis:    Code Status: Full Code Family Communication: No family present at bedside this am  Disposition Plan:  Level of care: Med-Surg Status is: Inpatient  Remains inpatient appropriate because:Ongoing diagnostic testing needed not appropriate for outpatient work up, Unsafe d/c plan, IV treatments appropriate due to intensity of illness or inability to take PO, and Inpatient level of care appropriate due to severity of illness  Dispo: The patient is from: Home              Anticipated d/c is to: Home              Patient currently is not medically  stable to d/c.   Difficult to place patient No    Consultants:  Vascular surgery  Procedures:  Incision and drainage, Dr. Stanford Breed 10/12  Antimicrobials:  Vancomycin 10/10>> Zosyn  10/10>>    Subjective: Patient seen examined at bedside, resting comfortably.  Sitting in bedside chair.  Continues with wound VAC in place.  Seen by vascular surgeon this morning.  Awaiting finalized wound cultures; currently growing GNR's.  No family present at bedside.  Patient currently without any concerns; but hopes for discharge home soon.  Denies headache, no visual changes, no chest pain, no palpitations, no shortness of breath, no abdominal pain, no fever/chills/night sweats, no nausea/vomiting/diarrhea.  No acute events overnight per nursing staff.  Objective: Vitals:   11/26/20 2033 11/27/20 0016 11/27/20 0508 11/27/20 0750  BP: (!) 122/58 132/71 (!) 143/83 (!) 141/64  Pulse: 98 88 76 79  Resp: 17 17 17 20   Temp: 98.1 F (36.7 C) 98 F (36.7 C) 97.7 F (36.5 C) 97.9 F (36.6 C)  TempSrc: Oral  Oral Oral  SpO2: 98% 100% 98% 97%  Weight:      Height:        Intake/Output Summary (Last 24 hours) at 11/27/2020 1208 Last data filed at 11/27/2020 1100 Gross per 24 hour  Intake 458.74 ml  Output 675 ml  Net -216.26 ml   Filed Weights   11/24/20 1318  Weight: 74.8 kg    Examination:  General exam: Appears calm and comfortable  Respiratory system: Clear to auscultation. Respiratory effort normal. Cardiovascular system: S1 & S2 heard, irregularly irregular rhythm, normal rate. No JVD, murmurs, rubs, gallops or clicks. No pedal edema. Gastrointestinal system: Abdomen is nondistended, soft and nontender. No organomegaly or masses felt. Normal bowel sounds heard. Central nervous system: Alert and oriented. No focal neurological deficits. Extremities: Wound VAC noted to left lower extremity; symmetric 5 x 5 power. Skin: No rashes, lesions or ulcers Psychiatry: Judgement and insight appear normal. Mood & affect appropriate.     Data Reviewed: I have personally reviewed following labs and imaging studies  CBC: Recent Labs  Lab 11/24/20 1329 11/24/20 2355  11/26/20 0457 11/27/20 0250  WBC 9.2 6.8 6.7 7.9  NEUTROABS 7.4  --  4.9 6.7  HGB 11.7* 11.1* 10.3* 10.2*  HCT 37.9 35.6* 34.5* 33.6*  MCV 85.4 85.2 86.5 85.1  PLT 177 160 165 034   Basic Metabolic Panel: Recent Labs  Lab 11/24/20 1329 11/24/20 2355 11/26/20 0457 11/27/20 0250  NA 136 137 143 137  K 3.6 3.2* 3.5 3.9  CL 100 101 104 101  CO2 25 26 28 28   GLUCOSE 131* 119* 106* 169*  BUN 14 14 9 16   CREATININE 0.98 0.98  0.97 0.85 0.94  CALCIUM 10.2 9.9 10.1 9.7  MG  --   --  1.4* 1.5*   GFR: Estimated Creatinine Clearance: 36.9 mL/min (by C-G formula based on SCr of 0.94 mg/dL). Liver Function Tests: Recent Labs  Lab 11/24/20 1329 11/24/20 2355  AST 21 19  ALT 16 15  ALKPHOS 51 41  BILITOT 0.6 0.8  PROT 7.7 6.8  ALBUMIN 3.6 3.2*   No results for input(s): LIPASE, AMYLASE in the last 168 hours. No results for input(s): AMMONIA in the last 168 hours. Coagulation Profile: No results for input(s): INR, PROTIME in the last 168 hours. Cardiac Enzymes: No results for input(s): CKTOTAL, CKMB, CKMBINDEX, TROPONINI in the last 168 hours. BNP (last 3 results) No results for input(s): PROBNP in the last 8760  hours. HbA1C: Recent Labs    11/24/20 2355  HGBA1C 5.7*   CBG: Recent Labs  Lab 11/26/20 1126 11/26/20 1709 11/26/20 2030 11/27/20 0752 11/27/20 1119  GLUCAP 103* 163* 228* 131* 152*   Lipid Profile: No results for input(s): CHOL, HDL, LDLCALC, TRIG, CHOLHDL, LDLDIRECT in the last 72 hours. Thyroid Function Tests: No results for input(s): TSH, T4TOTAL, FREET4, T3FREE, THYROIDAB in the last 72 hours. Anemia Panel: No results for input(s): VITAMINB12, FOLATE, FERRITIN, TIBC, IRON, RETICCTPCT in the last 72 hours. Sepsis Labs: Recent Labs  Lab 11/24/20 1511  LATICACIDVEN 1.9    Recent Results (from the past 240 hour(s))  Resp Panel by RT-PCR (Flu A&B, Covid) Nasopharyngeal Swab     Status: None   Collection Time: 11/24/20  3:51 PM   Specimen:  Nasopharyngeal Swab; Nasopharyngeal(NP) swabs in vial transport medium  Result Value Ref Range Status   SARS Coronavirus 2 by RT PCR NEGATIVE NEGATIVE Final    Comment: (NOTE) SARS-CoV-2 target nucleic acids are NOT DETECTED.  The SARS-CoV-2 RNA is generally detectable in upper respiratory specimens during the acute phase of infection. The lowest concentration of SARS-CoV-2 viral copies this assay can detect is 138 copies/mL. A negative result does not preclude SARS-Cov-2 infection and should not be used as the sole basis for treatment or other patient management decisions. A negative result may occur with  improper specimen collection/handling, submission of specimen other than nasopharyngeal swab, presence of viral mutation(s) within the areas targeted by this assay, and inadequate number of viral copies(<138 copies/mL). A negative result must be combined with clinical observations, patient history, and epidemiological information. The expected result is Negative.  Fact Sheet for Patients:  EntrepreneurPulse.com.au  Fact Sheet for Healthcare Providers:  IncredibleEmployment.be  This test is no t yet approved or cleared by the [place] FDA and  has been authorized for detection and/or diagnosis of SARS-CoV-2 by FDA under an Emergency Use Authorization (EUA). This EUA will remain  in effect (meaning this test can be used) for the duration of the COVID-19 declaration under Section 564(b)(1) of the Act, 21 U.S.C.section 360bbb-3(b)(1), unless the authorization is terminated  or revoked sooner.       Influenza A by PCR NEGATIVE NEGATIVE Final   Influenza B by PCR NEGATIVE NEGATIVE Final    Comment: (NOTE) The Xpert Xpress SARS-CoV-2/FLU/RSV plus assay is intended as an aid in the diagnosis of influenza from Nasopharyngeal swab specimens and should not be used as a sole basis for treatment. Nasal washings and aspirates are unacceptable for  Xpert Xpress SARS-CoV-2/FLU/RSV testing.  Fact Sheet for Patients: EntrepreneurPulse.com.au  Fact Sheet for Healthcare Providers: IncredibleEmployment.be  This test is not yet approved or cleared by the [place] FDA and has been authorized for detection and/or diagnosis of SARS-CoV-2 by FDA under an Emergency Use Authorization (EUA). This EUA will remain in effect (meaning this test can be used) for the duration of the COVID-19 declaration under Section 564(b)(1) of the Act, 21 U.S.C. section 360bbb-3(b)(1), unless the authorization is terminated or revoked.  Performed at [facility], [address] [place], [place] [address]   Aerobic/Anaerobic Culture w Gram Stain (surgical/deep wound)     Status: None (Preliminary result)   Collection Time: 11/26/20 11:07 AM   Specimen: Leg, Left; Wound  Result Value Ref Range Status   Specimen Description WOUND  Final   Special Requests LEFT LEG WOUND SPEC A  Final   Gram Stain   Final  MODERATE WBC PRESENT,BOTH PMN AND MONONUCLEAR FEW GRAM NEGATIVE RODS    Culture   Final    MODERATE GRAM NEGATIVE RODS IDENTIFICATION AND SUSCEPTIBILITIES TO FOLLOW Performed at [facility], [place] [address] [place],  [address]    Report Status PENDING  Incomplete         Radiology Studies: No results found.      Scheduled Meds:  amLODipine  10 mg Oral Daily   atorvastatin  10 mg Oral Daily   insulin aspart  0-15 Units Subcutaneous TID WC   insulin aspart  0-5 Units Subcutaneous QHS   losartan  100 mg Oral Daily   metoprolol tartrate  100 mg Oral BID   Continuous Infusions:  sodium chloride Stopped (11/26/20 1747)   piperacillin-tazobactam (ZOSYN)  IV 3.375 g (11/27/20 0829)   vancomycin Stopped (11/26/20 1747)     LOS: 3 days    Time spent: 40 minutes spent on chart review, discussion with nursing staff, consultants, updating family and interview/physical exam;  more than 50% of that time was spent in counseling and/or coordination of care.    Orvilla Truett J [place] DO [facility] Available via Epic secure chat 7am-7pm After these hours, please refer to coverage provider listed on [email or web address] 11/27/2020, 12:08 PM

## 2020-11-27 NOTE — Progress Notes (Signed)
Pharmacy Antibiotic Note  Chelsea Dean is a 87 y.o. female admitted on 11/24/2020 with cellulitis, worsening on PTA Keflex.  Pharmacy has been consulted for zosyn dosing.   SCr 0.94 stable. Cultures growing GNR, ok to stop vancomycin per MD.   Plan: Stop vancomycin Zosyn 3.375g IV (26min infusion) x1; then 3.375g IV q8h (4h infusion) Monitor cultures, clinical status, renal fx Narrow abx as able and f/u duration     Height: 5\' 2"  (157.5 cm) Weight: 74.8 kg (165 lb) IBW/kg (Calculated) : 50.1  Temp (24hrs), Avg:97.8 F (36.6 C), Min:97 F (36.1 C), Max:98.2 F (36.8 C)  Recent Labs  Lab 11/24/20 1329 11/24/20 1511 11/24/20 2355 11/26/20 0457 11/27/20 0250  WBC 9.2  --  6.8 6.7 7.9  CREATININE 0.98  --  0.98  0.97 0.85 0.94  LATICACIDVEN  --  1.9  --   --   --      Estimated Creatinine Clearance: 36.9 mL/min (by C-G formula based on SCr of 0.94 mg/dL).    Allergies  Allergen Reactions   Ace Inhibitors Shortness Of Breath and Rash   Contrast Media [Iodinated Diagnostic Agents] Shortness Of Breath and Rash   Nsaids     Unknown reaction    Antimicrobials this admission: 10/10 vancomycin >> 10/12  10/10 zosyn >>   Microbiology results: 10/12 Wound cx: mod GNR   Benetta Spar, PharmD, BCPS, BCCP Clinical Pharmacist  Please check AMION for all [place] phone numbers After 10:00 PM, call [place] [phone]

## 2020-11-27 NOTE — Evaluation (Signed)
Physical Therapy Evaluation Patient Details Name: Chelsea Dean MRN: [id number] DOB: [birth year] Today's Date: 11/27/2020  History of Present Illness  Chelsea Dean is a 87 year old female with past medical history significant for type 2 diabetes mellitus, essential hypertension, paroxysmal atrial fibrillation, diverticulosis, hyperlipidemia, AAA who presented to [facility] ED on 10/10 for progressive left lower extremity cellulitis and abscess. S/p I&D, debridement, and NPWT closure applied  Clinical Impression   Patient is s/p above surgery resulting in functional limitations due to the deficits listed below (see PT Problem List). Lives at home with her daughter in a single level home with a ramped entrance; Walks household distances at baseline, assist form daughter to shower; Presents to PT with generalized weakness, decr activity tolerance, decr experience working with NPWT; Walked short distance to recliner today with RW; I anticipate she'll be on track for dc home soon;  Patient will benefit from skilled PT to increase their independence and safety with mobility to allow discharge to the venue listed below.          Recommendations for follow up therapy are one component of a multi-disciplinary discharge planning process, led by the attending physician.  Recommendations may be updated based on patient status, additional functional criteria and insurance authorization.  Follow Up Recommendations Home health PT;Supervision/Assistance - 24 hour    Equipment Recommendations  Rolling walker with 5" wheels (youth-sized)    Recommendations for Other Services       Precautions / Restrictions Precautions Precautions: Fall Restrictions Weight Bearing Restrictions: No      Mobility  Bed Mobility Overal bed mobility: Needs Assistance Bed Mobility: Supine to Sit     Supine to sit: Min assist     General bed mobility comments: Moved LEs to EOB slowly, but without physical assist; min  handheld assist to pull to sit    Transfers Overall transfer level: Needs assistance Equipment used: Rolling walker (2 wheeled) Transfers: Sit to/from Stand Sit to Stand: Min guard         General transfer comment: Cues for hand placement; tends to reach for RW to pull self up  Ambulation/Gait Ambulation/Gait assistance: Min guard Gait Distance (Feet): 4 Feet (bed to recliner) Assistive device: Rolling walker (2 wheeled) Gait Pattern/deviations: Step-through pattern;Decreased step length - right;Decreased step length - left;Trunk flexed     General Gait Details: Noting typical size RW at lowest height is still not optimal height for pt; Recommend youth-sized RW  Stairs            Wheelchair Mobility    Modified Rankin (Stroke Patients Only)       Balance Overall balance assessment: Mild deficits observed, not formally tested                                           Pertinent Vitals/Pain Pain Assessment: No/denies pain    Home Living Family/patient expects to be discharged to:: Private residence Living Arrangements: Children Available Help at Discharge: Family;Available 24 hours/day (daughter) Type of Home: House Home Access: Ramped entrance     Home Layout: One level Home Equipment: Grab bars - tub/shower;Tub bench;Walker - 2 wheels;Wheelchair - manual;Cane - single point Additional Comments: Pt's RW was her husband's; likely poor fit    Prior Function Level of Independence: Needs assistance   Gait / Transfers Assistance Needed: RW for amb  ADL's / Homemaking Assistance Needed: Daughter's assist to  shower        Hand Dominance        Extremity/Trunk Assessment   Upper Extremity Assessment Upper Extremity Assessment: Overall WFL for tasks assessed    Lower Extremity Assessment Lower Extremity Assessment: Generalized weakness;LLE deficits/detail LLE Deficits / Details: Noted NPWT dressing proximally on the medial L lower  leg; no reports of pain or discomfort; Overall grossly WFL AROM LLE, and able to stand without phsyical assist to rise       Communication   Communication: HOH  Cognition Arousal/Alertness: Awake/alert Behavior During Therapy: WFL for tasks assessed/performed Overall Cognitive Status: Within Functional Limits for tasks assessed                                        General Comments General comments (skin integrity, edema, etc.): Pt's daughter, [name], present and helpful    Exercises     Assessment/Plan    PT Assessment Patient needs continued PT services  PT Problem List Decreased strength;Decreased activity tolerance;Decreased balance;Decreased mobility;Decreased knowledge of use of DME;Decreased safety awareness;Decreased knowledge of precautions;Decreased skin integrity       PT Treatment Interventions DME instruction;Gait training;Functional mobility training;Therapeutic activities;Therapeutic exercise;Balance training;Patient/family education    PT Goals (Current goals can be found in the Care Plan section)  Acute Rehab PT Goals Patient Stated Goal: be able to get home soon, for that wound to heal PT Goal Formulation: With patient/family Time For Goal Achievement: 12/11/20 Potential to Achieve Goals: Good    Frequency Min 3X/week   Barriers to discharge        Co-evaluation               AM-PAC PT "6 Clicks" Mobility  Outcome Measure Help needed turning from your back to your side while in a flat bed without using bedrails?: None Help needed moving from lying on your back to sitting on the side of a flat bed without using bedrails?: A Little Help needed moving to and from a bed to a chair (including a wheelchair)?: A Little Help needed standing up from a chair using your arms (e.g., wheelchair or bedside chair)?: A Little Help needed to walk in hospital room?: A Little Help needed climbing 3-5 steps with a railing? : A Little 6 Click  Score: 19    End of Session Equipment Utilized During Treatment: Gait belt Activity Tolerance: Patient tolerated treatment well Patient left: in chair;with call bell/phone within reach;with chair alarm set Nurse Communication: Mobility status PT Visit Diagnosis: Unsteadiness on feet (R26.81);Other abnormalities of gait and mobility (R26.89)    Time: 0981-1914 PT Time Calculation (min) (ACUTE ONLY): 38 min   Charges:   PT Evaluation $PT Eval Moderate Complexity: 1 Mod PT Treatments $Gait Training: 8-22 mins $Therapeutic Activity: 8-22 mins        Roney Marion, PT  Acute Rehabilitation Services Pager [phone] Office [phone]   [place] 11/27/2020, 10:23 AM

## 2020-11-28 LAB — BASIC METABOLIC PANEL
Anion gap: 8 (ref 5–15)
BUN: 21 mg/dL (ref 8–23)
CO2: 29 mmol/L (ref 22–32)
Calcium: 10 mg/dL (ref 8.9–10.3)
Chloride: 103 mmol/L (ref 98–111)
Creatinine, Ser: 1.16 mg/dL — ABNORMAL HIGH (ref 0.44–1.00)
GFR, Estimated: 45 mL/min — ABNORMAL LOW (ref >60)
Glucose, Bld: 102 mg/dL — ABNORMAL HIGH (ref 70–99)
Potassium: 3.8 mmol/L (ref 3.5–5.1)
Sodium: 140 mmol/L (ref 135–145)

## 2020-11-28 LAB — CBC WITH DIFFERENTIAL/PLATELET
Abs Immature Granulocytes: 0.07 10*3/uL (ref 0.00–0.07)
Basophils Absolute: 0 10*3/uL (ref 0.0–0.1)
Basophils Relative: 0 %
Eosinophils Absolute: 0.1 10*3/uL (ref 0.0–0.5)
Eosinophils Relative: 2 %
HCT: 32.2 % — ABNORMAL LOW (ref 36.0–46.0)
Hemoglobin: 9.8 g/dL — ABNORMAL LOW (ref 12.0–15.0)
Immature Granulocytes: 1 %
Lymphocytes Relative: 10 %
Lymphs Abs: 0.9 10*3/uL (ref 0.7–4.0)
MCH: 26.1 pg (ref 26.0–34.0)
MCHC: 30.4 g/dL (ref 30.0–36.0)
MCV: 85.9 fL (ref 80.0–100.0)
Monocytes Absolute: 0.9 10*3/uL (ref 0.1–1.0)
Monocytes Relative: 10 %
Neutro Abs: 6.8 10*3/uL (ref 1.7–7.7)
Neutrophils Relative %: 77 %
Platelets: 209 10*3/uL (ref 150–400)
RBC: 3.75 MIL/uL — ABNORMAL LOW (ref 3.87–5.11)
RDW: 13.9 % (ref 11.5–15.5)
WBC: 8.9 10*3/uL (ref 4.0–10.5)
nRBC: 0 % (ref 0.0–0.2)

## 2020-11-28 LAB — GLUCOSE, CAPILLARY
Glucose-Capillary: 88 mg/dL (ref 70–99)
Glucose-Capillary: 129 mg/dL — ABNORMAL HIGH (ref 70–99)
Glucose-Capillary: 112 mg/dL — ABNORMAL HIGH (ref 70–99)

## 2020-11-28 LAB — MAGNESIUM: Magnesium: 2.4 mg/dL (ref 1.7–2.4)

## 2020-11-28 MED ORDER — CIPROFLOXACIN HCL 500 MG PO TABS: 500.0000 mg | ORAL_TABLET | Freq: Two times a day (BID) | ORAL | 0 refills | Status: AC

## 2020-11-28 MED ORDER — TRAMADOL HCL 50 MG PO TABS
50.0000 mg | ORAL_TABLET | Freq: Four times a day (QID) | ORAL | Status: DC
  Administered 2020-11-28: 50 mg via ORAL
  Filled 2020-11-28: qty 1

## 2020-11-28 NOTE — Progress Notes (Signed)
Patient and daughter given discharge instructions and all questions answered. Patient switched to home wound vac and daughter shown how to troubleshoot and given number for home health if needed. PIV dcd and patient helped to dress.

## 2020-11-28 NOTE — Progress Notes (Addendum)
  Progress Note    11/28/2020 7:32 AM 2 Days Post-Op  Subjective:  sleeping- woke easily. Denies any pain. Somewhat confused about still needing surgery   Vitals:   11/27/20 1955 11/28/20 0434  BP: (!) 115/54 (!) 134/55  Pulse: 64 68  Resp: 17   Temp: 99.2 F (37.3 C) 98.1 F (36.7 C)  SpO2: 93% 92%   Physical Exam: Cardiac:  regular Lungs:  non labored Incisions:  left leg wound VAC with good seal. 50 cc bloody output Extremities:  well perfused and warm bilaterally Abdomen:  obese, soft, non distended Neurologic: alert  CBC    Component Value Date/Time   WBC 8.9 11/28/2020 0201   RBC 3.75 (L) 11/28/2020 0201   HGB 9.8 (L) 11/28/2020 0201   HCT 32.2 (L) 11/28/2020 0201   PLT 209 11/28/2020 0201   MCV 85.9 11/28/2020 0201   MCH 26.1 11/28/2020 0201   MCHC 30.4 11/28/2020 0201   RDW 13.9 11/28/2020 0201   LYMPHSABS 0.9 11/28/2020 0201   MONOABS 0.9 11/28/2020 0201   EOSABS 0.1 11/28/2020 0201   BASOSABS 0.0 11/28/2020 0201    BMET    Component Value Date/Time   NA 140 11/28/2020 0201   K 3.8 11/28/2020 0201   CL 103 11/28/2020 0201   CO2 29 11/28/2020 0201   GLUCOSE 102 (H) 11/28/2020 0201   BUN 21 11/28/2020 0201   CREATININE 1.16 (H) 11/28/2020 0201   CALCIUM 10.0 11/28/2020 0201   GFRNONAA 45 (L) 11/28/2020 0201   GFRAA 51 (L) 04/01/2018 1854    INR    Component Value Date/Time   INR 1.4 (H) 04/01/2020 0634     Intake/Output Summary (Last 24 hours) at 11/28/2020 0732 Last data filed at 11/28/2020 0438 Gross per 24 hour  Intake 800 ml  Output 1750 ml  Net -950 ml   Component 2 d ago   Specimen Description WOUND   Special Requests LEFT LEG WOUND SPEC A   Gram Stain MODERATE WBC PRESENT,BOTH PMN AND MONONUCLEAR  FEW GRAM NEGATIVE RODS   Culture MODERATE SERRATIA MARCESCENS  SUSCEPTIBILITIES TO FOLLOW  Performed at [facility], [address], [place],  [address]   Report Status PENDING     Assessment/Plan:  87 y.o.  female is s/p   1) incision and drainage of left calf 2) mechanical debridement of wound bed (6cm long x 1cm wide x 1.5cm deep) 3) initiation of negative pressure wound therapy (wound vac 6 cm long x 1cm wide x 1.5 cm deep)    Wound VAC change today. Patient just woke up and requests to have it changed later today. Will ask wound care RN to change vac later today Surgical cultures with PMN and GNR/ moderate serratia marcescens Susceptibilities pending On Vanc and Zosyn will adjust pending sensitivities Home VAC will arrive prior to D/c   Karoline Caldwell, PA-C [facility] [phone] 11/28/2020 7:32 AM  I have independently interviewed and examined patient and agree with PA assessment and plan above.  Should be okay for short course of p.o. antibiotics once susceptibilities returned given that the wound has been drained and there is no underlying prosthetic material.  She will need home wound VAC and can follow-up with [place] in 2 weeks.  Aviance [place] C. Donzetta Matters, MD [facility] [place] Office: [phone] Pager: [phone]

## 2020-11-28 NOTE — Discharge Summary (Signed)
Physician Discharge Summary  Teneka Malmberg TDH:[id number] DOB: 09-11-1927 DOA: 11/24/2020  PCP: [name], MD  Admit date: 11/24/2020 Discharge date: 11/28/2020  Admitted From: home Disposition:  home w/ hh  Recommendations for Outpatient Follow-up:  Follow up with PCP in 1-2 weeks Please obtain BMP/CBC in one week Please follow up on the following pending results:  Home Health:yes  Equipment/Devices: WOUND VAC  Discharge Condition: Stable Code Status:   Code Status: Full Code Diet recommendation:  Diet Order             Diet - low sodium heart healthy           Diet heart healthy/carb modified Room service appropriate? Yes; Fluid consistency: Thin  Diet effective now                    Brief/Interim Summary: 87 year old female with T2DM, HTN, PAF, diverticulosis, HLD, AAA, left leg ischemia secondary to embolism status post popliteal embolectomy by Dr. Donnetta Hutching in February 2022 presented on 10/10 to ED with left lower extremity cellulitis and abscess seen in the ED and advised to have outpatient PCP follow-up.  Prior to that she was seen in the ED on 10/7 and had I&D done with 10 cc of blood/pus and drain and was on Keflex. In the ED low-grade temp, fairly stable labs, CT showed complex collection of subcutaneous soft tissue along the medial aspect of the left lower leg 6 cm without bony involvement and evidence of cellulitis and likely underlying abscess.  Patient was placed on antibiotics and was admitted sent to [facility] for vascular surgery evaluation from posterior lung. Patient underwent I&D on 10/12 surgical culture growing Serratia, has a wound VAC in place Overall she is clinically improving PT OT has recommended home health PT OT upon discharge. Seen and vascular okay for discharge culture sensitivity came back and patient will be discharged on oral antibiotic.  Discharge Diagnoses:  LLE cellulitis and underlying abscess:underwent I&D on 10/12  surgical culture growing Serratia, has a wound VAC in place. Overall she is clinically improving PT OT has recommended home health PT OT upon dischargE.  Will discuss with ID for antibiotic recommendation C/S pending.  On Zosyn while inpatient. C/S came back-sensitive to Cipro, Bactrim- patient will continue with wound VAC at home and home health PT OT and wound care.  Vascular surgery recommended short course of antibiotic given that wound is draining no underlying prosthetic material-discussed with ID plan is to continue 5 more days of Cipro to complete 7 days antibiotic postsurgery.  Daughter informed especially about tendinopathy. Cont wound vac at home  HTN blood pressure controlled on home regimen HLD continue Lipitor T2DM continue glipizide at home, A1c stable at 5.7 PAF continue metoprolol, Eliquis on hold-resume on discharge.  Consults: VASCULAR  Subjective: Alert awake oriented no pain has not needed any pain medications afebrile.  Daughter at the bedside.  She is hard of hearing.  Agreeable for discharge home today.  Discharge Exam: Vitals:   11/28/20 0434 11/28/20 0742  BP: (!) 134/55 94/65  Pulse: 68 69  Resp:  16  Temp: 98.1 F (36.7 C) 98 F (36.7 C)  SpO2: 92% 96%   General: Pt is alert, awake, not in acute distress Cardiovascular: RRR, S1/S2 +, no rubs, no gallops Respiratory: CTA bilaterally, no wheezing, no rhonchi Abdominal: Soft, NT, ND, bowel sounds + Extremities: no edema, no cyanosis.  Left lower extremity with wound VAC in place.    Discharge Instructions  Discharge Instructions     Diet - low sodium heart healthy   Complete by: As directed    Discharge instructions   Complete by: As directed    Follow-up with her vascular surgery as instructed.  Discuss with vascular surgery regarding Eliquis if you see worsening bleeding from the wound  Please call call MD or return to ER for similar or worsening recurring problem that brought you to hospital or if  any fever,nausea/vomiting,abdominal pain, uncontrolled pain, chest pain,  shortness of breath or any other alarming symptoms.  Please follow-up your doctor as instructed in a week time and call the office for appointment.  Please avoid alcohol, smoking, or any other illicit substance and maintain healthy habits including taking your regular medications as prescribed.  You were cared for by a hospitalist during your hospital stay. If you have any questions about your discharge medications or the care you received while you were in the hospital after you are discharged, you can call the unit and ask to speak with the hospitalist on call if the hospitalist that took care of you is not available.  Once you are discharged, your primary care physician will handle any further medical issues. Please note that NO REFILLS for any discharge medications will be authorized once you are discharged, as it is imperative that you return to your primary care physician (or establish a relationship with a primary care physician if you do not have one) for your aftercare needs so that they can reassess your need for medications and monitor your lab values   Discharge wound care:   Complete by: As directed    125 mm/Hg   Suction Type>  Continuous   Increase activity slowly   Complete by: As directed       Allergies as of 11/28/2020       Reactions   Ace Inhibitors Shortness Of Breath, Rash   Contrast Media [iodinated Diagnostic Agents] Shortness Of Breath, Rash   Nsaids    Unknown reaction        Medication List     STOP taking these medications    cephALEXin 500 MG capsule Commonly known as: KEFLEX   Lidocaine 4 % Ptch Commonly known as: HM Lidocaine Patch       TAKE these medications    acetaminophen 650 MG CR tablet Commonly known as: TYLENOL Take 650-1,300 mg by mouth every 8 (eight) hours as needed for pain.   amLODipine 10 MG tablet Commonly known as: NORVASC TAKE 1 TABLET BY MOUTH  EVERY DAY FOR BLOOD PRESSURE   atorvastatin 10 MG tablet Commonly known as: LIPITOR TAKE 1 TABLET BY MOUTH EVERY DAY   ciprofloxacin 500 MG tablet Commonly known as: Cipro Take 1 tablet (500 mg total) by mouth 2 (two) times daily for 5 days.   diclofenac sodium 1 % Gel Commonly known as: VOLTAREN Apply 2 g topically daily as needed (KNEE PAIN).   Eliquis 5 MG Tabs tablet Generic drug: apixaban TAKE 1 TABLET BY MOUTH TWICE DAILY FOR 30 DAYS (02/13/20 TO INDEFINITELY)   glipiZIDE 10 MG 24 hr tablet Commonly known as: GLUCOTROL XL TAKE 1 TABLET BY MOUTH EVERY DAY WITH BREAKFAST   losartan 100 MG tablet Commonly known as: COZAAR TAKE 1 TABLET BY MOUTH EVERY DAY   metoprolol tartrate 100 MG tablet Commonly known as: LOPRESSOR TAKE 1 TABLET BY MOUTH 2 TIMES A DAY   traMADol 50 MG tablet Commonly known as: ULTRAM TAKE 1 TABLET BY MOUTH EVERY  [place]  (From admission, onward)           Start     Ordered   11/27/20 1010  For home use only DME Walker youth  Once       Question:  Patient needs a walker to treat with the following condition  Answer:  Weakness   11/27/20 1009   11/26/20 1106  For home use only DME Negative pressure wound device  Once       Question Answer Comment  Frequency of dressing change 3 times per week   Length of need 3 Months   Dressing type Foam   Amount of suction 125 mm/Hg   Pressure application Continuous pressure   Supplies 10 canisters and 15 dressings per month for duration of therapy      11/26/20 1107              Discharge Care Instructions  (From admission, onward)           Start     Ordered   11/28/20 0000  Discharge wound care:       Comments: 125 mm/Hg   Suction Type>  Continuous   11/28/20 1312            Follow-up Information     [facility] Follow up in 2 week(s).   Why: The office will call the patient with an  appointment, For wound re-check Contact information: [facility] [place], [place] Follow up.   Contact information: [place] [address] [phone]         [name], MD Follow up in 1 week(s).   Specialty: Family Medicine Contact information: [address] [place] [address] [phone]                Allergies  Allergen Reactions   Ace Inhibitors Shortness Of Breath and Rash   Contrast Media [Iodinated Diagnostic Agents] Shortness Of Breath and Rash   Nsaids     Unknown reaction    The results of significant diagnostics from this hospitalization (including imaging, microbiology, ancillary and laboratory) are listed below for reference.    Microbiology: Recent Results (from the past 240 hour(s))  Resp Panel by RT-PCR (Flu A&B, Covid) Nasopharyngeal Swab     Status: None   Collection Time: 11/24/20  3:51 PM   Specimen: Nasopharyngeal Swab; Nasopharyngeal(NP) swabs in vial transport medium  Result Value Ref Range Status   SARS Coronavirus 2 by RT PCR NEGATIVE NEGATIVE Final    Comment: (NOTE) SARS-CoV-2 target nucleic acids are NOT DETECTED.  The SARS-CoV-2 RNA is generally detectable in upper respiratory specimens during the acute phase of infection. The lowest concentration of SARS-CoV-2 viral copies this assay can detect is 138 copies/mL. A negative result does not preclude SARS-Cov-2 infection and should not be used as the sole basis for treatment or other patient management decisions. A negative result may occur with  improper specimen collection/handling, submission of specimen other than nasopharyngeal swab, presence of viral mutation(s) within the areas targeted by this assay, and inadequate number of viral copies(<138 copies/mL). A negative result must be combined with clinical observations, patient history, and  epidemiological information. The expected result is Negative.  Fact Sheet for Patients:  EntrepreneurPulse.com.au  Fact Sheet for Healthcare Providers:  IncredibleEmployment.be  This test is no t yet approved or cleared by the [place] FDA and  has been authorized for detection and/or diagnosis of SARS-CoV-2 by FDA under an Emergency Use Authorization (EUA). This EUA will remain  in effect (meaning this test can be used) for the duration of the COVID-19 declaration under Section 564(b)(1) of the Act, 21 U.S.C.section 360bbb-3(b)(1), unless the authorization is terminated  or revoked sooner.       Influenza A by PCR NEGATIVE NEGATIVE Final   Influenza B by PCR NEGATIVE NEGATIVE Final    Comment: (NOTE) The Xpert Xpress SARS-CoV-2/FLU/RSV plus assay is intended as an aid in the diagnosis of influenza from Nasopharyngeal swab specimens and should not be used as a sole basis for treatment. Nasal washings and aspirates are unacceptable for Xpert Xpress SARS-CoV-2/FLU/RSV testing.  Fact Sheet for Patients: EntrepreneurPulse.com.au  Fact Sheet for Healthcare Providers: IncredibleEmployment.be  This test is not yet approved or cleared by the [place] FDA and has been authorized for detection and/or diagnosis of SARS-CoV-2 by FDA under an Emergency Use Authorization (EUA). This EUA will remain in effect (meaning this test can be used) for the duration of the COVID-19 declaration under Section 564(b)(1) of the Act, 21 U.S.C. section 360bbb-3(b)(1), unless the authorization is terminated or revoked.  Performed at [facility], [address] [place], [place] [address]   Aerobic/Anaerobic Culture w Gram Stain (surgical/deep wound)     Status: None (Preliminary result)   Collection Time: 11/26/20 11:07 AM   Specimen: Leg, Left; Wound  Result Value Ref Range Status   Specimen Description  WOUND  Final   Special Requests LEFT LEG WOUND SPEC A  Final   Gram Stain   Final    MODERATE WBC PRESENT,BOTH PMN AND MONONUCLEAR FEW GRAM NEGATIVE RODS Performed at [facility], [place] [address] [place], [place] [address]    Culture MODERATE SERRATIA MARCESCENS  Final   Report Status PENDING  Incomplete   Organism ID, Bacteria SERRATIA MARCESCENS  Final      Susceptibility   Serratia marcescens - MIC*    CEFAZOLIN >=64 RESISTANT Resistant     CEFEPIME <=0.12 SENSITIVE Sensitive     CEFTAZIDIME <=1 SENSITIVE Sensitive     CEFTRIAXONE <=0.25 SENSITIVE Sensitive     CIPROFLOXACIN <=0.25 SENSITIVE Sensitive     GENTAMICIN <=1 SENSITIVE Sensitive     TRIMETH/SULFA <=20 SENSITIVE Sensitive     * MODERATE SERRATIA MARCESCENS    Procedures/Studies: DG Tibia/Fibula Left  Result Date: 11/24/2020 CLINICAL DATA:  Open wound medial calf and lower posterior calf EXAM: LEFT TIBIA AND FIBULA - 2 VIEW COMPARISON:  02/08/2020 FINDINGS: Frontal and lateral views of the left tibia and fibula are obtained. There is extensive soft tissue edema throughout the entire visualized left lower leg. No evidence of subcutaneous emphysema. There are no acute or destructive bony lesions. Stable osteoarthritis of the left knee and ankle. IMPRESSION: 1. Diffuse soft tissue edema.  No evidence of subcutaneous gas. 2. No acute or destructive bony lesions. 3. Stable osteoarthritis. Electronically Signed   By: Randa Ngo M.D.   On: 11/24/2020 15:14   CT Tibia Fibula Left Wo Contrast  Result Date: 11/24/2020 CLINICAL DATA:  History of left lower leg abscess status post incision and drainage on 11/21/2020 EXAM: CT OF THE LOWER LEFT EXTREMITY WITHOUT CONTRAST TECHNIQUE: Multidetector CT imaging of the lower left extremity was performed according to the standard  protocol. COMPARISON:  Ultrasound 11/17/2020.  X-ray 11/24/2020 FINDINGS: Bones/Joint/Cartilage No acute fracture. No dislocation. No bony erosion or  periosteal elevation. Severe tricompartmental osteoarthritis of the left knee. Mild degenerative changes at the left ankle. No lytic or sclerotic bone lesion. Ligaments Suboptimally assessed by CT. Muscles and Tendons No acute musculotendinous abnormality by CT. No intramuscular fluid collection. Soft tissues Complex collection within the subcutaneous soft tissues along the medial aspect of the lower leg at the level of the proximal tibial diaphysis measuring approximately 6.0 x 2.9 x 3.1 cm (series 8, image 122). Density of this collection is greater than than simple fluid. There is stranding within the adjacent subcutaneous fat. Collection abuts the overlying skin surface with area of probable prior incision along its inferior margin. Overlying skin thickening. No gas within the collection. There is subcutaneous edema throughout the lower leg, more pronounced distally. No soft tissue gas. Prominent atherosclerotic vascular calcifications. IMPRESSION: 1. Complex collection within the subcutaneous soft tissues along the medial aspect of the left lower leg measuring up to 6.0 cm, most likely representing residual inflammatory collection given history of recent abscess drainage. 2. No acute osseous abnormality. No bony erosion or periosteal elevation to suggest osteomyelitis. 3. Diffuse subcutaneous edema throughout the lower leg, which may reflect cellulitis versus dependent change, although findings appear asymmetric compared with the included portion of the contralateral right lower extremity. No soft tissue gas. 4. Severe tricompartmental osteoarthritis of the left knee. Electronically Signed   By: Davina Poke D.O.   On: 11/24/2020 16:32   US Venous Img Lower Unilateral Left  Result Date: 11/17/2020 CLINICAL DATA:  Swelling and edema of the limb. EXAM: LEFT LOWER EXTREMITY VENOUS DOPPLER ULTRASOUND TECHNIQUE: Gray-scale sonography with compression, as well as color and duplex ultrasound, were performed to  evaluate the deep venous system(s) from the level of the common femoral vein through the popliteal and proximal calf veins. COMPARISON:  None. FINDINGS: VENOUS Normal compressibility of the common femoral, superficial femoral, and popliteal veins, as well as the visualized calf veins. Visualized portions of profunda femoral vein and great saphenous vein unremarkable. No filling defects to suggest DVT on grayscale or color Doppler imaging. Doppler waveforms show normal direction of venous flow, normal respiratory plasticity and response to augmentation. Limited views of the contralateral common femoral vein are unremarkable. OTHER 4.5 x 2.4 x 2.3 cm fluid collection in the upper medial calf which may reflect inferior extension of a Baker's cyst versus a liquified hematoma. Limitations: none IMPRESSION: 1. No evidence of left lower extremity deep venous thrombosis. 2. 4.5 x 2.4 x 2.3 cm fluid collection in the upper medial calf which may reflect inferior extension of a Baker's cyst versus a liquified hematoma. Electronically Signed   By: Kathreen Devoid M.D.   On: 11/17/2020 12:18    Labs: BNP (last 3 results) No results for input(s): BNP in the last 8760 hours. Basic Metabolic Panel: Recent Labs  Lab 11/24/20 1329 11/24/20 2355 11/26/20 0457 11/27/20 0250 11/28/20 0201  NA 136 137 143 137 140  K 3.6 3.2* 3.5 3.9 3.8  CL 100 101 104 101 103  CO2 25 26 28 28 29   GLUCOSE 131* 119* 106* 169* 102*  BUN 14 14 9 16 21   CREATININE 0.98 0.98  0.97 0.85 0.94 1.16*  CALCIUM 10.2 9.9 10.1 9.7 10.0  MG  --   --  1.4* 1.5* 2.4   Liver Function Tests: Recent Labs  Lab 11/24/20 1329 11/24/20 2355  AST 21 19  ALT 16 15  ALKPHOS 51 41  BILITOT 0.6 0.8  PROT 7.7 6.8  ALBUMIN 3.6 3.2*   No results for input(s): LIPASE, AMYLASE in the last 168 hours. No results for input(s): AMMONIA in the last 168 hours. CBC: Recent Labs  Lab 11/24/20 1329 11/24/20 2355 11/26/20 0457 11/27/20 0250 11/28/20 0201   WBC 9.2 6.8 6.7 7.9 8.9  NEUTROABS 7.4  --  4.9 6.7 6.8  HGB 11.7* 11.1* 10.3* 10.2* 9.8*  HCT 37.9 35.6* 34.5* 33.6* 32.2*  MCV 85.4 85.2 86.5 85.1 85.9  PLT 177 160 165 199 209   Cardiac Enzymes: No results for input(s): CKTOTAL, CKMB, CKMBINDEX, TROPONINI in the last 168 hours. BNP: Invalid input(s): POCBNP CBG: Recent Labs  Lab 11/27/20 1119 11/27/20 1636 11/27/20 1956 11/28/20 0802 11/28/20 1101  GLUCAP 152* 125* 127* 88 129*   D-Dimer No results for input(s): DDIMER in the last 72 hours. Hgb A1c No results for input(s): HGBA1C in the last 72 hours. Lipid Profile No results for input(s): CHOL, HDL, LDLCALC, TRIG, CHOLHDL, LDLDIRECT in the last 72 hours. Thyroid function studies No results for input(s): TSH, T4TOTAL, T3FREE, THYROIDAB in the last 72 hours.  Invalid input(s): FREET3 Anemia work up No results for input(s): VITAMINB12, FOLATE, FERRITIN, TIBC, IRON, RETICCTPCT in the last 72 hours. Urinalysis    Component Value Date/Time   COLORURINE YELLOW 06/06/2020 1021   APPEARANCEUR CLEAR 06/06/2020 1021   LABSPEC 1.020 06/06/2020 1021   PHURINE 5.0 06/06/2020 1021   GLUCOSEU NEGATIVE 06/06/2020 1021   HGBUR TRACE-INTACT (A) 06/06/2020 1021   BILIRUBINUR NEGATIVE 06/06/2020 1021   KETONESUR NEGATIVE 06/06/2020 1021   PROTEINUR 100 (A) 04/01/2018 1754   UROBILINOGEN 0.2 06/06/2020 1021   NITRITE NEGATIVE 06/06/2020 1021   LEUKOCYTESUR NEGATIVE 06/06/2020 1021   Sepsis Labs Invalid input(s): PROCALCITONIN,  WBC,  LACTICIDVEN Microbiology Recent Results (from the past 240 hour(s))  Resp Panel by RT-PCR (Flu A&B, Covid) Nasopharyngeal Swab     Status: None   Collection Time: 11/24/20  3:51 PM   Specimen: Nasopharyngeal Swab; Nasopharyngeal(NP) swabs in vial transport medium  Result Value Ref Range Status   SARS Coronavirus 2 by RT PCR NEGATIVE NEGATIVE Final    Comment: (NOTE) SARS-CoV-2 target nucleic acids are NOT DETECTED.  The SARS-CoV-2 RNA is  generally detectable in upper respiratory specimens during the acute phase of infection. The lowest concentration of SARS-CoV-2 viral copies this assay can detect is 138 copies/mL. A negative result does not preclude SARS-Cov-2 infection and should not be used as the sole basis for treatment or other patient management decisions. A negative result may occur with  improper specimen collection/handling, submission of specimen other than nasopharyngeal swab, presence of viral mutation(s) within the areas targeted by this assay, and inadequate number of viral copies(<138 copies/mL). A negative result must be combined with clinical observations, patient history, and epidemiological information. The expected result is Negative.  Fact Sheet for Patients:  EntrepreneurPulse.com.au  Fact Sheet for Healthcare Providers:  IncredibleEmployment.be  This test is no t yet approved or cleared by the [place] FDA and  has been authorized for detection and/or diagnosis of SARS-CoV-2 by FDA under an Emergency Use Authorization (EUA). This EUA will remain  in effect (meaning this test can be used) for the duration of the COVID-19 declaration under Section 564(b)(1) of the Act, 21 U.S.C.section 360bbb-3(b)(1), unless the authorization is terminated  or revoked sooner.       Influenza A by PCR NEGATIVE NEGATIVE Final  Influenza B by PCR NEGATIVE NEGATIVE Final    Comment: (NOTE) The Xpert Xpress SARS-CoV-2/FLU/RSV plus assay is intended as an aid in the diagnosis of influenza from Nasopharyngeal swab specimens and should not be used as a sole basis for treatment. Nasal washings and aspirates are unacceptable for Xpert Xpress SARS-CoV-2/FLU/RSV testing.  Fact Sheet for Patients: EntrepreneurPulse.com.au  Fact Sheet for Healthcare Providers: IncredibleEmployment.be  This test is not yet approved or cleared by the [place] FDA and has been authorized for detection and/or diagnosis of SARS-CoV-2 by FDA under an Emergency Use Authorization (EUA). This EUA will remain in effect (meaning this test can be used) for the duration of the COVID-19 declaration under Section 564(b)(1) of the Act, 21 U.S.C. section 360bbb-3(b)(1), unless the authorization is terminated or revoked.  Performed at [facility], [address] [place], [place] [address]   Aerobic/Anaerobic Culture w Gram Stain (surgical/deep wound)     Status: None (Preliminary result)   Collection Time: 11/26/20 11:07 AM   Specimen: Leg, Left; Wound  Result Value Ref Range Status   Specimen Description WOUND  Final   Special Requests LEFT LEG WOUND SPEC A  Final   Gram Stain   Final    MODERATE WBC PRESENT,BOTH PMN AND MONONUCLEAR FEW GRAM NEGATIVE RODS Performed at [facility], [place] [address] [place], [place] [address]    Culture MODERATE SERRATIA MARCESCENS  Final   Report Status PENDING  Incomplete   Organism ID, Bacteria SERRATIA MARCESCENS  Final      Susceptibility   Serratia marcescens - MIC*    CEFAZOLIN >=64 RESISTANT Resistant     CEFEPIME <=0.12 SENSITIVE Sensitive     CEFTAZIDIME <=1 SENSITIVE Sensitive     CEFTRIAXONE <=0.25 SENSITIVE Sensitive     CIPROFLOXACIN <=0.25 SENSITIVE Sensitive     GENTAMICIN <=1 SENSITIVE Sensitive     TRIMETH/SULFA <=20 SENSITIVE Sensitive     * MODERATE SERRATIA MARCESCENS     Time coordinating discharge: 25 minutes  SIGNED: Antonieta Pert, MD  [facility] 11/28/2020, 1:13 PM  If 7PM-7AM, please contact night-coverage [email or web address]

## 2020-11-28 NOTE — Progress Notes (Signed)
Physical Therapy Treatment Patient Details Name: Chelsea Dean MRN: [id number] DOB: 02-10-1928 Today's Date: 11/28/2020   History of Present Illness Chelsea Dean is a 87 year old female with past medical history significant for type 2 diabetes mellitus, essential hypertension, paroxysmal atrial fibrillation, diverticulosis, hyperlipidemia, AAA who presented to [facility] ED on 10/10 for progressive left lower extremity cellulitis and abscess. S/p I&D, debridement, and NPWT closure applied    PT Comments    Pt was seen for mobility on RW with help to maneuver and manage her cath, lines of IV and wound vac.  Pt is otherwise easy to assist, requiring RW for safety and support.  Two longer walks in room were done, with lines being the most complicated part of the care. Follow her for standing balance, endurance with gait and safety awareness with observation that she is HOH and requires close proximity and speaking in L ear to understand.  Instruct daughter who is caregiver for pt with any follow up care information.    Recommendations for follow up therapy are one component of a multi-disciplinary discharge planning process, led by the attending physician.  Recommendations may be updated based on patient status, additional functional criteria and insurance authorization.  Follow Up Recommendations  Home health PT;Supervision for mobility/OOB     Equipment Recommendations  Rolling walker with 5" wheels    Recommendations for Other Services       Precautions / Restrictions Precautions Precautions: Fall Precaution Comments: lines including wound vac Restrictions Weight Bearing Restrictions: No     Mobility  Bed Mobility Overal bed mobility: Needs Assistance Bed Mobility: Supine to Sit     Supine to sit: Min assist     General bed mobility comments: min assist to sit up and min to scoot to EOB    Transfers Overall transfer level: Needs assistance Equipment used: Rolling walker (2  wheeled) Transfers: Sit to/from Stand Sit to Stand: Min guard;Min assist         General transfer comment: reminders for hand placement, PT mainly assisting lines  Ambulation/Gait Ambulation/Gait assistance: Min guard Gait Distance (Feet): 80 Feet (40+35) Assistive device: Rolling walker (2 wheeled) Gait Pattern/deviations: Step-through pattern;Decreased step length - right;Decreased step length - left;Trunk flexed Gait velocity: reduced   General Gait Details: youth walker is appropriate, vac carried on IV pole   Stairs             Wheelchair Mobility    Modified Rankin (Stroke Patients Only)       Balance Overall balance assessment: Needs assistance Sitting-balance support: Feet supported Sitting balance-Leahy Scale: Fair     Standing balance support: Bilateral upper extremity supported;During functional activity Standing balance-Leahy Scale: Fair Standing balance comment: walker is essential for balance once mobile                            Cognition Arousal/Alertness: Awake/alert Behavior During Therapy: WFL for tasks assessed/performed Overall Cognitive Status: Within Functional Limits for tasks assessed                                 General Comments: pt smiled at [place] during session      Exercises      General Comments General comments (skin integrity, edema, etc.): Pt is moving in her room with some fatigue issues due to not getting to sleep until 5 AM      Pertinent  Vitals/Pain Pain Assessment: 0-10 Pain Score: 5  Pain Location: L leg Pain Descriptors / Indicators: Guarding;Grimacing Pain Intervention(s): Limited activity within patient's tolerance;Monitored during session;Premedicated before session;Repositioned    Home Living                      Prior Function            PT Goals (current goals can now be found in the care plan section) Acute Rehab PT Goals Patient Stated Goal: home with  daughter Progress towards PT goals: Progressing toward goals    Frequency    Min 3X/week      PT Plan Current plan remains appropriate    Co-evaluation              AM-PAC PT "6 Clicks" Mobility   Outcome Measure  Help needed turning from your back to your side while in a flat bed without using bedrails?: None Help needed moving from lying on your back to sitting on the side of a flat bed without using bedrails?: A Little Help needed moving to and from a bed to a chair (including a wheelchair)?: A Little Help needed standing up from a chair using your arms (e.g., wheelchair or bedside chair)?: A Little Help needed to walk in hospital room?: A Little Help needed climbing 3-5 steps with a railing? : A Little 6 Click Score: 19    End of Session Equipment Utilized During Treatment: Gait belt Activity Tolerance: Patient tolerated treatment well Patient left: in chair;with call bell/phone within reach;with chair alarm set Nurse Communication: Mobility status PT Visit Diagnosis: Unsteadiness on feet (R26.81);Other abnormalities of gait and mobility (R26.89)     Time: 1950-9326 PT Time Calculation (min) (ACUTE ONLY): 25 min  Charges:  $Gait Training: 8-22 mins $Therapeutic Activity: 8-22 mins      Ramond Dial 11/28/2020, 1:07 PM  Mee Hives, PT PhD Acute Rehab Dept. Number: [place] [facility] [place]

## 2020-11-28 NOTE — Care Management (Signed)
[employer] has released home VAC, delivery to hospital room expected today "after lunch".    [place] arranged for [facility]

## 2020-11-28 NOTE — Consult Note (Signed)
[place] Nurse Consult Note: Reason for Consult: NPWT dressing changes. Dr. Cleotilde Neer calf abscess after popliteal thrombectomy.  Wound type: surgical  Pressure Injury POA: NA Measurement: 3cm x 1.5cm x 1.0cm  Wound bed:100% clean, pink, moist Drainage (amount, consistency, odor) scant, serosanguinous  Periwound: intact  Dressing procedure/placement/frequency: Removed old NPWT dressing Periwound skin protected with skin barrier wipe  Filled wound with  _1__ piece of black foam Sealed NPWT dressing at 176mm HG Patient received IV and PO pain medication per bedside nurse prior to dressing change Patient tolerated procedure well  [place] nurse will continue to provide NPWT dressing changed due to the complexity of the dressing change.  [place], [place], [place]

## 2020-12-01 ENCOUNTER — Telehealth: Payer: Self-pay | Admitting: *Deleted

## 2020-12-01 DIAGNOSIS — D696 Thrombocytopenia, unspecified: Secondary | ICD-10-CM | POA: Diagnosis not present

## 2020-12-01 DIAGNOSIS — I10 Essential (primary) hypertension: Secondary | ICD-10-CM | POA: Diagnosis not present

## 2020-12-01 DIAGNOSIS — E1151 Type 2 diabetes mellitus with diabetic peripheral angiopathy without gangrene: Secondary | ICD-10-CM | POA: Diagnosis not present

## 2020-12-01 DIAGNOSIS — M10071 Idiopathic gout, right ankle and foot: Secondary | ICD-10-CM | POA: Diagnosis not present

## 2020-12-01 DIAGNOSIS — Z9181 History of falling: Secondary | ICD-10-CM | POA: Diagnosis not present

## 2020-12-01 DIAGNOSIS — M47812 Spondylosis without myelopathy or radiculopathy, cervical region: Secondary | ICD-10-CM | POA: Diagnosis not present

## 2020-12-01 DIAGNOSIS — Z79891 Long term (current) use of opiate analgesic: Secondary | ICD-10-CM | POA: Diagnosis not present

## 2020-12-01 DIAGNOSIS — I714 Abdominal aortic aneurysm, without rupture, unspecified: Secondary | ICD-10-CM | POA: Diagnosis not present

## 2020-12-01 DIAGNOSIS — L02416 Cutaneous abscess of left lower limb: Secondary | ICD-10-CM | POA: Diagnosis not present

## 2020-12-01 DIAGNOSIS — B9689 Other specified bacterial agents as the cause of diseases classified elsewhere: Secondary | ICD-10-CM | POA: Diagnosis not present

## 2020-12-01 DIAGNOSIS — Z7901 Long term (current) use of anticoagulants: Secondary | ICD-10-CM | POA: Diagnosis not present

## 2020-12-01 DIAGNOSIS — T8149XA Infection following a procedure, other surgical site, initial encounter: Secondary | ICD-10-CM | POA: Diagnosis not present

## 2020-12-01 DIAGNOSIS — E785 Hyperlipidemia, unspecified: Secondary | ICD-10-CM | POA: Diagnosis not present

## 2020-12-01 DIAGNOSIS — I480 Paroxysmal atrial fibrillation: Secondary | ICD-10-CM | POA: Diagnosis not present

## 2020-12-01 DIAGNOSIS — L03116 Cellulitis of left lower limb: Secondary | ICD-10-CM | POA: Diagnosis not present

## 2020-12-01 DIAGNOSIS — K5732 Diverticulitis of large intestine without perforation or abscess without bleeding: Secondary | ICD-10-CM | POA: Diagnosis not present

## 2020-12-01 DIAGNOSIS — M5416 Radiculopathy, lumbar region: Secondary | ICD-10-CM | POA: Diagnosis not present

## 2020-12-01 LAB — AEROBIC/ANAEROBIC CULTURE W GRAM STAIN (SURGICAL/DEEP WOUND)

## 2020-12-01 NOTE — Telephone Encounter (Signed)
Transition Care Management Follow-up Telephone Call Date of discharge and from where: [facility] 11-28-2020 How have you been since you were released from the hospital? Doing ok Any questions or concerns? Yes  Items Reviewed: Did the pt receive and understand the discharge instructions provided? Yes  Medications obtained and verified? Yes  Other? No  Any new allergies since your discharge? No  Dietary orders reviewed? Yes Do you have support at home? Yes   Home Care and Equipment/Supplies: Were home health services ordered? yes If so, what is the name of the agency?   Has the agency set up a time to come to the patient's home? yes Were any new equipment or medical supplies ordered?  Yes:   What is the name of the medical supply agency?  Were you able to get the supplies/equipment? yes Do you have any questions related to the use of the equipment or supplies? No  Functional Questionnaire: (I = Independent and D = Dependent) ADLs: d  Bathing/Dressing- D  Meal Prep- D  Eating- I  Maintaining continence- I  Transferring/Ambulation- D/I  Managing Meds- D/I  Follow up appointments reviewed:  PCP Hospital f/u appt confirmed? Yes  Scheduled to see Dr. Ethelene Hal on 12-04-2020 @ 11:30. [facility] f/u appt confirmed? Yes  Scheduled to see Vascular on 11-2 -2022@ 10:00. Are transportation arrangements needed? No  If their condition worsens, is the pt aware to call PCP or go to the Emergency Dept.? Yes Was the patient provided with contact information for the PCP's office or ED? Yes Was to pt encouraged to call back with questions or concerns? Yes

## 2020-12-03 DIAGNOSIS — Z7901 Long term (current) use of anticoagulants: Secondary | ICD-10-CM | POA: Diagnosis not present

## 2020-12-03 DIAGNOSIS — E785 Hyperlipidemia, unspecified: Secondary | ICD-10-CM | POA: Diagnosis not present

## 2020-12-03 DIAGNOSIS — B9689 Other specified bacterial agents as the cause of diseases classified elsewhere: Secondary | ICD-10-CM | POA: Diagnosis not present

## 2020-12-03 DIAGNOSIS — D696 Thrombocytopenia, unspecified: Secondary | ICD-10-CM | POA: Diagnosis not present

## 2020-12-03 DIAGNOSIS — L03116 Cellulitis of left lower limb: Secondary | ICD-10-CM | POA: Diagnosis not present

## 2020-12-03 DIAGNOSIS — M10071 Idiopathic gout, right ankle and foot: Secondary | ICD-10-CM | POA: Diagnosis not present

## 2020-12-03 DIAGNOSIS — M47812 Spondylosis without myelopathy or radiculopathy, cervical region: Secondary | ICD-10-CM | POA: Diagnosis not present

## 2020-12-03 DIAGNOSIS — E1151 Type 2 diabetes mellitus with diabetic peripheral angiopathy without gangrene: Secondary | ICD-10-CM | POA: Diagnosis not present

## 2020-12-03 DIAGNOSIS — Z79891 Long term (current) use of opiate analgesic: Secondary | ICD-10-CM | POA: Diagnosis not present

## 2020-12-03 DIAGNOSIS — I10 Essential (primary) hypertension: Secondary | ICD-10-CM | POA: Diagnosis not present

## 2020-12-03 DIAGNOSIS — M5416 Radiculopathy, lumbar region: Secondary | ICD-10-CM | POA: Diagnosis not present

## 2020-12-03 DIAGNOSIS — I480 Paroxysmal atrial fibrillation: Secondary | ICD-10-CM | POA: Diagnosis not present

## 2020-12-03 DIAGNOSIS — K5732 Diverticulitis of large intestine without perforation or abscess without bleeding: Secondary | ICD-10-CM | POA: Diagnosis not present

## 2020-12-03 DIAGNOSIS — I714 Abdominal aortic aneurysm, without rupture, unspecified: Secondary | ICD-10-CM | POA: Diagnosis not present

## 2020-12-03 DIAGNOSIS — T8149XA Infection following a procedure, other surgical site, initial encounter: Secondary | ICD-10-CM | POA: Diagnosis not present

## 2020-12-03 DIAGNOSIS — Z9181 History of falling: Secondary | ICD-10-CM | POA: Diagnosis not present

## 2020-12-03 DIAGNOSIS — L02416 Cutaneous abscess of left lower limb: Secondary | ICD-10-CM | POA: Diagnosis not present

## 2020-12-04 ENCOUNTER — Encounter: Payer: Self-pay | Admitting: Family Medicine

## 2020-12-04 ENCOUNTER — Other Ambulatory Visit: Payer: Self-pay

## 2020-12-04 ENCOUNTER — Ambulatory Visit (INDEPENDENT_AMBULATORY_CARE_PROVIDER_SITE_OTHER): Payer: Medicare Other | Admitting: Family Medicine

## 2020-12-04 VITALS — BP 108/66 | HR 52 | Temp 97.1°F | Ht 62.0 in | Wt 165.0 lb

## 2020-12-04 DIAGNOSIS — I714 Abdominal aortic aneurysm, without rupture, unspecified: Secondary | ICD-10-CM | POA: Diagnosis not present

## 2020-12-04 DIAGNOSIS — L02416 Cutaneous abscess of left lower limb: Secondary | ICD-10-CM | POA: Diagnosis not present

## 2020-12-04 DIAGNOSIS — L03116 Cellulitis of left lower limb: Secondary | ICD-10-CM | POA: Diagnosis not present

## 2020-12-04 DIAGNOSIS — T8149XA Infection following a procedure, other surgical site, initial encounter: Secondary | ICD-10-CM | POA: Diagnosis not present

## 2020-12-04 DIAGNOSIS — E1151 Type 2 diabetes mellitus with diabetic peripheral angiopathy without gangrene: Secondary | ICD-10-CM | POA: Diagnosis not present

## 2020-12-04 DIAGNOSIS — G4701 Insomnia due to medical condition: Secondary | ICD-10-CM | POA: Diagnosis not present

## 2020-12-04 DIAGNOSIS — I10 Essential (primary) hypertension: Secondary | ICD-10-CM

## 2020-12-04 DIAGNOSIS — Z9181 History of falling: Secondary | ICD-10-CM | POA: Diagnosis not present

## 2020-12-04 DIAGNOSIS — I480 Paroxysmal atrial fibrillation: Secondary | ICD-10-CM | POA: Diagnosis not present

## 2020-12-04 DIAGNOSIS — M10071 Idiopathic gout, right ankle and foot: Secondary | ICD-10-CM | POA: Diagnosis not present

## 2020-12-04 DIAGNOSIS — Z79891 Long term (current) use of opiate analgesic: Secondary | ICD-10-CM | POA: Diagnosis not present

## 2020-12-04 DIAGNOSIS — Z7901 Long term (current) use of anticoagulants: Secondary | ICD-10-CM | POA: Diagnosis not present

## 2020-12-04 DIAGNOSIS — I4891 Unspecified atrial fibrillation: Secondary | ICD-10-CM

## 2020-12-04 DIAGNOSIS — I998 Other disorder of circulatory system: Secondary | ICD-10-CM

## 2020-12-04 DIAGNOSIS — M47812 Spondylosis without myelopathy or radiculopathy, cervical region: Secondary | ICD-10-CM | POA: Diagnosis not present

## 2020-12-04 DIAGNOSIS — Z73819 Behavioral insomnia of childhood, unspecified type: Secondary | ICD-10-CM

## 2020-12-04 DIAGNOSIS — E785 Hyperlipidemia, unspecified: Secondary | ICD-10-CM | POA: Diagnosis not present

## 2020-12-04 DIAGNOSIS — D696 Thrombocytopenia, unspecified: Secondary | ICD-10-CM | POA: Diagnosis not present

## 2020-12-04 DIAGNOSIS — M5416 Radiculopathy, lumbar region: Secondary | ICD-10-CM | POA: Diagnosis not present

## 2020-12-04 DIAGNOSIS — B9689 Other specified bacterial agents as the cause of diseases classified elsewhere: Secondary | ICD-10-CM | POA: Diagnosis not present

## 2020-12-04 DIAGNOSIS — K5732 Diverticulitis of large intestine without perforation or abscess without bleeding: Secondary | ICD-10-CM | POA: Diagnosis not present

## 2020-12-04 LAB — CBC
HCT: 35.5 % — ABNORMAL LOW (ref 36.0–46.0)
Hemoglobin: 11.1 g/dL — ABNORMAL LOW (ref 12.0–15.0)
MCHC: 31.3 g/dL (ref 30.0–36.0)
MCV: 82.9 fl (ref 78.0–100.0)
Platelets: 180 10*3/uL (ref 150.0–400.0)
RBC: 4.28 Mil/uL (ref 3.87–5.11)
RDW: 15.4 % (ref 11.5–15.5)
WBC: 6.5 10*3/uL (ref 4.0–10.5)

## 2020-12-04 MED ORDER — TRAZODONE HCL 50 MG PO TABS
ORAL_TABLET | ORAL | 1 refills | Status: DC
Start: 2020-12-04 — End: 2023-09-12

## 2020-12-04 MED ORDER — METOPROLOL TARTRATE 50 MG PO TABS
50.0000 mg | ORAL_TABLET | Freq: Two times a day (BID) | ORAL | 3 refills | Status: DC
Start: 2020-12-04 — End: 2021-12-18

## 2020-12-04 MED ORDER — SULFAMETHOXAZOLE-TRIMETHOPRIM 800-160 MG PO TABS: 1.0000 | ORAL_TABLET | Freq: Two times a day (BID) | ORAL | 0 refills | Status: AC

## 2020-12-04 NOTE — Progress Notes (Signed)
Established Patient Office Visit  Subjective:  Patient ID: Chelsea Dean, female    DOB: [birth year]  Age: 87 y.o. MRN: [id number]  CC:  Chief Complaint  Patient presents with   [facility] follow up wound on left leg have noticed a new area same leg. Would like something to help with sleep very restless.     HPI Chelsea Dean presents for for hospital discharge follow-up status post I&D complicated abscess left leg.  Leg is doing better than before but she has developed a pustule distal to the wrapping for her surgical I&D.  There is an indwelling drain.  She has been afebrile.  Energy levels are down.  She is not sleeping well at night.  There is been no fever or chills.  Energy levels are down.  Finished 5 days of Cipro yesterday.  Wound culture was positive for Serratia sensitive to both Cipro and Septra.  Past Medical History:  Diagnosis Date   A-fib ([place]) 10/19/2016   Abdominal aortic aneurysm (AAA) without rupture 09/15/2013   Acute idiopathic gout of right foot    AKI (acute kidney injury) ([place]) 10/18/2016   Anticoagulated on Coumadin    Arthritis    Diabetes mellitus without complication (HCC)    Diverticular disease of large intestine 04/16/2013   Diverticulosis    DM2 (diabetes mellitus, type 2) (HCC) 10/19/2016   Dysrhythmia    Atrial fib   History of colon polyps 04/16/2013   Hx of colonic polyps    Hyperlipidemia    Hypertension    Long term current use of anticoagulant therapy 02/05/2015   Lumbar radiculopathy 03/28/2014   Microalbuminuria 04/16/2013   Osteoarthritis 04/16/2013   Osteoarthritis of cervical spine 03/28/2014   Pelvic mass 10/19/2016   Right renal mass    SBO (small bowel obstruction) ([place]) 10/18/2016   Thrombocytopenia ([place]) 04/16/2013   Overview:  referred to hematology 01/2104    Past Surgical History:  Procedure Laterality Date   ABDOMINAL AORTIC ANEURYSM REPAIR     ABDOMINAL HYSTERECTOMY     APPLICATION OF WOUND VAC Left  11/26/2020   Procedure: APPLICATION OF WOUND VAC;  Surgeon: Cherre Robins, MD;  Location: [place] [facility];  Service: Vascular;  Laterality: Left;   INCISION AND DRAINAGE Left 11/26/2020   Procedure: INCISION AND DRAINAGE OF LEFT LEG;  Surgeon: Cherre Robins, MD;  Location: [facility];  Service: Vascular;  Laterality: Left;   IR ANGIOGRAM EXTREMITY LEFT  04/01/2020   IR ANGIOGRAM PELVIS SELECTIVE OR SUPRASELECTIVE  04/01/2020   IR ANGIOGRAM SELECTIVE EACH ADDITIONAL VESSEL  04/01/2020   IR ANGIOGRAM SELECTIVE EACH ADDITIONAL VESSEL  04/01/2020   IR ANGIOGRAM SELECTIVE EACH ADDITIONAL VESSEL  04/01/2020   IR AORTAGRAM ABDOMINAL SERIALOGRAM  04/01/2020   IR EMBO ARTERIAL NOT HEMORR HEMANG INC GUIDE ROADMAPPING  04/01/2020   IR GENERIC HISTORICAL  07/01/2015   IR RADIOLOGIST EVAL & MGMT 07/01/2015 [facility] INTERV RAD   IR RADIOLOGIST EVAL & MGMT  03/11/2020   IR RADIOLOGIST EVAL & MGMT  04/30/2020   IR THROMBECT PRIM MECH INIT (INCLU) MOD SED  04/01/2020   IR US GUIDE VASC ACCESS LEFT  04/01/2020   IR US GUIDE VASC ACCESS LEFT  04/01/2020   IR US GUIDE VASC ACCESS RIGHT  04/01/2020   THROMBECTOMY FEMORAL ARTERY Left 04/01/2020   Procedure: THROMBECTOMY LEFT POPLITEAL ARTERY;  Surgeon: Rosetta Posner, MD;  Location: [place];  Service: Vascular;  Laterality: Left;    Family History  Problem Relation Age of Onset   Cancer Sister        Rectal and Stomach    Social History   Socioeconomic History   Marital status: Married    Spouse name: Not on file   Number of children: Not on file   Years of education: Not on file   Highest education level: Not on file  Occupational History   Occupation: retired [employer]  Tobacco Use   Smoking status: Never   Smokeless tobacco: Current    Types: Snuff  Vaping Use   Vaping Use: Never used  Substance and Sexual Activity   Alcohol use: No    Alcohol/week: 0.0 standard drinks   Drug use: No   Sexual activity: Not on file  Other Topics Concern   Not on file  Social  History Narrative   Admitted to [facility] 10/27/16   Married   Use snuff   Alcohol none   DNR   Social Determinants of Health   Financial Resource Strain: Low Risk    Difficulty of Paying Living Expenses: Not hard at all  Food Insecurity: No Food Insecurity   Worried About Charity fundraiser in the Last Year: Never true   [place] in the Last Year: Never true  Transportation Needs: No Transportation Needs   Lack of Transportation (Medical): No   Lack of Transportation (Non-Medical): No  Physical Activity: Insufficiently Active   Days of Exercise per Week: 3 days   Minutes of Exercise per Session: 10 min  Stress: No Stress Concern Present   Feeling of Stress : Not at all  Social Connections: Socially Isolated   Frequency of Communication with Friends and Family: Three times a week   Frequency of Social Gatherings with Friends and Family: Once a week   Attends Religious Services: Never   Marine scientist or Organizations: No   Attends Archivist Meetings: Never   Marital Status: Widowed  Human resources officer Violence: Not At Risk   Fear of Current or Ex-Partner: No   Emotionally Abused: No   Physically Abused: No   Sexually Abused: No    Outpatient Medications Prior to Visit  Medication Sig Dispense Refill   acetaminophen (TYLENOL) 650 MG CR tablet Take 650-1,300 mg by mouth every 8 (eight) hours as needed for pain.     amLODipine (NORVASC) 10 MG tablet TAKE 1 TABLET BY MOUTH EVERY DAY FOR BLOOD PRESSURE 90 tablet 1   atorvastatin (LIPITOR) 10 MG tablet TAKE 1 TABLET BY MOUTH EVERY DAY 90 tablet 3   diclofenac sodium (VOLTAREN) 1 % GEL Apply 2 g topically daily as needed (KNEE PAIN).     ELIQUIS 5 MG TABS tablet TAKE 1 TABLET BY MOUTH TWICE DAILY FOR 30 DAYS (02/13/20 TO INDEFINITELY) 60 tablet 5   glipiZIDE (GLUCOTROL XL) 10 MG 24 hr tablet TAKE 1 TABLET BY MOUTH EVERY DAY WITH BREAKFAST 90 tablet 2   losartan (COZAAR) 100 MG tablet TAKE 1  TABLET BY MOUTH EVERY DAY 90 tablet 1   traMADol (ULTRAM) 50 MG tablet TAKE 1 TABLET BY MOUTH EVERY 12 HOURS ASNEEDED FOR PAIN 60 tablet 0   metoprolol tartrate (LOPRESSOR) 100 MG tablet TAKE 1 TABLET BY MOUTH 2 TIMES A DAY 180 tablet 1   No facility-administered medications prior to visit.    Allergies  Allergen Reactions   Ace Inhibitors Shortness Of Breath and Rash   Contrast Media [Iodinated Diagnostic Agents] Shortness  Of Breath and Rash   Nsaids     Unknown reaction    ROS Review of Systems  Constitutional:  Positive for fatigue. Negative for chills, diaphoresis, fever and unexpected weight change.  Respiratory: Negative.    Cardiovascular: Negative.   Gastrointestinal: Negative.   Skin:  Positive for wound.  Neurological:  Negative for speech difficulty and light-headedness.  Psychiatric/Behavioral:  Positive for sleep disturbance.      Objective:    Physical Exam Vitals and nursing note reviewed.  Constitutional:      General: She is not in acute distress.    Appearance: She is ill-appearing. She is not toxic-appearing or diaphoretic.  HENT:     Head: Normocephalic and atraumatic.  Pulmonary:     Effort: Pulmonary effort is normal.  Skin:    General: Skin is warm and dry.       Neurological:     Mental Status: She is alert and oriented to person, place, and time.  Psychiatric:        Mood and Affect: Mood normal.        Behavior: Behavior normal.    BP 108/66 (BP Location: Right Arm, Patient Position: Sitting, Cuff Size: Normal)   Pulse (!) 52   Temp (!) 97.1 F (36.2 C) (Temporal)   Ht 5\' 2"  (1.575 m)   Wt 165 lb (74.8 kg)   SpO2 97%   BMI 30.18 kg/m  Wt Readings from Last 3 Encounters:  12/04/20 165 lb (74.8 kg)  11/24/20 165 lb (74.8 kg)  11/24/20 165 lb (74.8 kg)     Health Maintenance Due  Topic Date Due   TETANUS/TDAP  Never done   Zoster Vaccines- Shingrix (1 of 2) Never done   DEXA SCAN  Never done    There are no preventive care  reminders to display for this patient.  Lab Results  Component Value Date   TSH 3.26 03/02/2017   Lab Results  Component Value Date   WBC 8.9 11/28/2020   HGB 9.8 (L) 11/28/2020   HCT 32.2 (L) 11/28/2020   MCV 85.9 11/28/2020   PLT 209 11/28/2020   Lab Results  Component Value Date   NA 140 11/28/2020   K 3.8 11/28/2020   CO2 29 11/28/2020   GLUCOSE 102 (H) 11/28/2020   BUN 21 11/28/2020   CREATININE 1.16 (H) 11/28/2020   BILITOT 0.8 11/24/2020   ALKPHOS 41 11/24/2020   AST 19 11/24/2020   ALT 15 11/24/2020   PROT 6.8 11/24/2020   ALBUMIN 3.2 (L) 11/24/2020   CALCIUM 10.0 11/28/2020   ANIONGAP 8 11/28/2020   GFR 32.92 (L) 09/18/2020   Lab Results  Component Value Date   CHOL 134 06/06/2020   Lab Results  Component Value Date   HDL 33.10 (L) 06/06/2020   Lab Results  Component Value Date   LDLCALC 73 06/06/2020   Lab Results  Component Value Date   TRIG 135.0 06/06/2020   Lab Results  Component Value Date   CHOLHDL 4 06/06/2020   Lab Results  Component Value Date   HGBA1C 5.7 (H) 11/24/2020      Assessment & Plan:   Problem List Items Addressed This Visit       Cardiovascular and Mediastinum   Atrial fibrillation with RVR (HCC)   Relevant Medications   metoprolol tartrate (LOPRESSOR) 50 MG tablet   Essential hypertension   Relevant Medications   metoprolol tartrate (LOPRESSOR) 50 MG tablet   Ischemic leg   Relevant Medications  metoprolol tartrate (LOPRESSOR) 50 MG tablet     Other   Behavioral insomnia of childhood   Cellulitis of left leg - Primary   Relevant Medications   sulfamethoxazole-trimethoprim (BACTRIM DS) 800-160 MG tablet   Other Relevant Orders   CBC   Other Visit Diagnoses     Insomnia due to medical condition       Relevant Medications   traZODone (DESYREL) 50 MG tablet       Meds ordered this encounter  Medications   metoprolol tartrate (LOPRESSOR) 50 MG tablet    Sig: Take 1 tablet (50 mg total) by mouth 2  (two) times daily.    Dispense:  180 tablet    Refill:  3   sulfamethoxazole-trimethoprim (BACTRIM DS) 800-160 MG tablet    Sig: Take 1 tablet by mouth 2 (two) times daily for 7 days.    Dispense:  14 tablet    Refill:  0   traZODone (DESYREL) 50 MG tablet    Sig: 1/2 po q hs prn sleep    Dispense:  30 tablet    Refill:  1    Follow-up: Return follow up on 11/2.Marland Kitchen  Decrease in blood pressure and pulse rate.  Will decrease Lopressor to 50 mg twice daily.  Potential interaction of Cipro with glipizide.  We will treat with Septra for 7 days.  We will try low-dose trazodone for insomnia.  Has follow-up with general surgery on the 26th.  Encourage warm compresses to lower leg.  We will follow-up with me on 2 November.  [name], MD

## 2020-12-05 DIAGNOSIS — M47812 Spondylosis without myelopathy or radiculopathy, cervical region: Secondary | ICD-10-CM | POA: Diagnosis not present

## 2020-12-05 DIAGNOSIS — D696 Thrombocytopenia, unspecified: Secondary | ICD-10-CM | POA: Diagnosis not present

## 2020-12-05 DIAGNOSIS — E1151 Type 2 diabetes mellitus with diabetic peripheral angiopathy without gangrene: Secondary | ICD-10-CM | POA: Diagnosis not present

## 2020-12-05 DIAGNOSIS — L02416 Cutaneous abscess of left lower limb: Secondary | ICD-10-CM | POA: Diagnosis not present

## 2020-12-05 DIAGNOSIS — I480 Paroxysmal atrial fibrillation: Secondary | ICD-10-CM | POA: Diagnosis not present

## 2020-12-05 DIAGNOSIS — M10071 Idiopathic gout, right ankle and foot: Secondary | ICD-10-CM | POA: Diagnosis not present

## 2020-12-05 DIAGNOSIS — L03116 Cellulitis of left lower limb: Secondary | ICD-10-CM | POA: Diagnosis not present

## 2020-12-05 DIAGNOSIS — Z9181 History of falling: Secondary | ICD-10-CM | POA: Diagnosis not present

## 2020-12-05 DIAGNOSIS — Z7901 Long term (current) use of anticoagulants: Secondary | ICD-10-CM | POA: Diagnosis not present

## 2020-12-05 DIAGNOSIS — T8149XA Infection following a procedure, other surgical site, initial encounter: Secondary | ICD-10-CM | POA: Diagnosis not present

## 2020-12-05 DIAGNOSIS — Z79891 Long term (current) use of opiate analgesic: Secondary | ICD-10-CM | POA: Diagnosis not present

## 2020-12-05 DIAGNOSIS — K5732 Diverticulitis of large intestine without perforation or abscess without bleeding: Secondary | ICD-10-CM | POA: Diagnosis not present

## 2020-12-05 DIAGNOSIS — M5416 Radiculopathy, lumbar region: Secondary | ICD-10-CM | POA: Diagnosis not present

## 2020-12-05 DIAGNOSIS — I714 Abdominal aortic aneurysm, without rupture, unspecified: Secondary | ICD-10-CM | POA: Diagnosis not present

## 2020-12-05 DIAGNOSIS — I10 Essential (primary) hypertension: Secondary | ICD-10-CM | POA: Diagnosis not present

## 2020-12-05 DIAGNOSIS — E785 Hyperlipidemia, unspecified: Secondary | ICD-10-CM | POA: Diagnosis not present

## 2020-12-05 DIAGNOSIS — B9689 Other specified bacterial agents as the cause of diseases classified elsewhere: Secondary | ICD-10-CM | POA: Diagnosis not present

## 2020-12-08 ENCOUNTER — Telehealth: Payer: Self-pay | Admitting: Family Medicine

## 2020-12-08 DIAGNOSIS — L02416 Cutaneous abscess of left lower limb: Secondary | ICD-10-CM | POA: Diagnosis not present

## 2020-12-08 DIAGNOSIS — I480 Paroxysmal atrial fibrillation: Secondary | ICD-10-CM | POA: Diagnosis not present

## 2020-12-08 DIAGNOSIS — M10071 Idiopathic gout, right ankle and foot: Secondary | ICD-10-CM | POA: Diagnosis not present

## 2020-12-08 DIAGNOSIS — E785 Hyperlipidemia, unspecified: Secondary | ICD-10-CM | POA: Diagnosis not present

## 2020-12-08 DIAGNOSIS — Z7901 Long term (current) use of anticoagulants: Secondary | ICD-10-CM | POA: Diagnosis not present

## 2020-12-08 DIAGNOSIS — M5416 Radiculopathy, lumbar region: Secondary | ICD-10-CM | POA: Diagnosis not present

## 2020-12-08 DIAGNOSIS — I714 Abdominal aortic aneurysm, without rupture, unspecified: Secondary | ICD-10-CM | POA: Diagnosis not present

## 2020-12-08 DIAGNOSIS — K5732 Diverticulitis of large intestine without perforation or abscess without bleeding: Secondary | ICD-10-CM | POA: Diagnosis not present

## 2020-12-08 DIAGNOSIS — Z79891 Long term (current) use of opiate analgesic: Secondary | ICD-10-CM | POA: Diagnosis not present

## 2020-12-08 DIAGNOSIS — M47812 Spondylosis without myelopathy or radiculopathy, cervical region: Secondary | ICD-10-CM | POA: Diagnosis not present

## 2020-12-08 DIAGNOSIS — E1151 Type 2 diabetes mellitus with diabetic peripheral angiopathy without gangrene: Secondary | ICD-10-CM | POA: Diagnosis not present

## 2020-12-08 DIAGNOSIS — I10 Essential (primary) hypertension: Secondary | ICD-10-CM | POA: Diagnosis not present

## 2020-12-08 DIAGNOSIS — T8149XA Infection following a procedure, other surgical site, initial encounter: Secondary | ICD-10-CM | POA: Diagnosis not present

## 2020-12-08 DIAGNOSIS — B9689 Other specified bacterial agents as the cause of diseases classified elsewhere: Secondary | ICD-10-CM | POA: Diagnosis not present

## 2020-12-08 DIAGNOSIS — D696 Thrombocytopenia, unspecified: Secondary | ICD-10-CM | POA: Diagnosis not present

## 2020-12-08 DIAGNOSIS — Z9181 History of falling: Secondary | ICD-10-CM | POA: Diagnosis not present

## 2020-12-08 DIAGNOSIS — L03116 Cellulitis of left lower limb: Secondary | ICD-10-CM | POA: Diagnosis not present

## 2020-12-08 NOTE — Telephone Encounter (Signed)
Occupational Therapist called to request verbal order.... 1 x a week for 8 weeks.   CBJonnie Finner, [phone]

## 2020-12-08 NOTE — Telephone Encounter (Signed)
Returned Chelsea Dean's call no answer LMTCB

## 2020-12-08 NOTE — Telephone Encounter (Signed)
Chelsea Dean from [facility] is calling needing verbal orders for PT  1time 1week,  2times 2weeks and 1time for 6 weeks. It's OK  to leave a message on her voice mail at [phone].

## 2020-12-09 DIAGNOSIS — D696 Thrombocytopenia, unspecified: Secondary | ICD-10-CM | POA: Diagnosis not present

## 2020-12-09 DIAGNOSIS — I10 Essential (primary) hypertension: Secondary | ICD-10-CM | POA: Diagnosis not present

## 2020-12-09 DIAGNOSIS — Z9181 History of falling: Secondary | ICD-10-CM | POA: Diagnosis not present

## 2020-12-09 DIAGNOSIS — I480 Paroxysmal atrial fibrillation: Secondary | ICD-10-CM | POA: Diagnosis not present

## 2020-12-09 DIAGNOSIS — M10071 Idiopathic gout, right ankle and foot: Secondary | ICD-10-CM | POA: Diagnosis not present

## 2020-12-09 DIAGNOSIS — E1151 Type 2 diabetes mellitus with diabetic peripheral angiopathy without gangrene: Secondary | ICD-10-CM | POA: Diagnosis not present

## 2020-12-09 DIAGNOSIS — M5416 Radiculopathy, lumbar region: Secondary | ICD-10-CM | POA: Diagnosis not present

## 2020-12-09 DIAGNOSIS — Z7901 Long term (current) use of anticoagulants: Secondary | ICD-10-CM | POA: Diagnosis not present

## 2020-12-09 DIAGNOSIS — E785 Hyperlipidemia, unspecified: Secondary | ICD-10-CM | POA: Diagnosis not present

## 2020-12-09 DIAGNOSIS — L02416 Cutaneous abscess of left lower limb: Secondary | ICD-10-CM | POA: Diagnosis not present

## 2020-12-09 DIAGNOSIS — Z79891 Long term (current) use of opiate analgesic: Secondary | ICD-10-CM | POA: Diagnosis not present

## 2020-12-09 DIAGNOSIS — L03116 Cellulitis of left lower limb: Secondary | ICD-10-CM | POA: Diagnosis not present

## 2020-12-09 DIAGNOSIS — M47812 Spondylosis without myelopathy or radiculopathy, cervical region: Secondary | ICD-10-CM | POA: Diagnosis not present

## 2020-12-09 DIAGNOSIS — I714 Abdominal aortic aneurysm, without rupture, unspecified: Secondary | ICD-10-CM | POA: Diagnosis not present

## 2020-12-09 DIAGNOSIS — K5732 Diverticulitis of large intestine without perforation or abscess without bleeding: Secondary | ICD-10-CM | POA: Diagnosis not present

## 2020-12-09 DIAGNOSIS — T8149XA Infection following a procedure, other surgical site, initial encounter: Secondary | ICD-10-CM | POA: Diagnosis not present

## 2020-12-09 DIAGNOSIS — B9689 Other specified bacterial agents as the cause of diseases classified elsewhere: Secondary | ICD-10-CM | POA: Diagnosis not present

## 2020-12-09 NOTE — Telephone Encounter (Signed)
Verbal for below orders given

## 2020-12-09 NOTE — Telephone Encounter (Signed)
Verbal order left on VM  

## 2020-12-11 DIAGNOSIS — I480 Paroxysmal atrial fibrillation: Secondary | ICD-10-CM | POA: Diagnosis not present

## 2020-12-11 DIAGNOSIS — T8149XA Infection following a procedure, other surgical site, initial encounter: Secondary | ICD-10-CM | POA: Diagnosis not present

## 2020-12-11 DIAGNOSIS — E785 Hyperlipidemia, unspecified: Secondary | ICD-10-CM | POA: Diagnosis not present

## 2020-12-11 DIAGNOSIS — Z7901 Long term (current) use of anticoagulants: Secondary | ICD-10-CM | POA: Diagnosis not present

## 2020-12-11 DIAGNOSIS — M5416 Radiculopathy, lumbar region: Secondary | ICD-10-CM | POA: Diagnosis not present

## 2020-12-11 DIAGNOSIS — L02416 Cutaneous abscess of left lower limb: Secondary | ICD-10-CM | POA: Diagnosis not present

## 2020-12-11 DIAGNOSIS — M10071 Idiopathic gout, right ankle and foot: Secondary | ICD-10-CM | POA: Diagnosis not present

## 2020-12-11 DIAGNOSIS — L03116 Cellulitis of left lower limb: Secondary | ICD-10-CM | POA: Diagnosis not present

## 2020-12-11 DIAGNOSIS — D696 Thrombocytopenia, unspecified: Secondary | ICD-10-CM | POA: Diagnosis not present

## 2020-12-11 DIAGNOSIS — Z79891 Long term (current) use of opiate analgesic: Secondary | ICD-10-CM | POA: Diagnosis not present

## 2020-12-11 DIAGNOSIS — K5732 Diverticulitis of large intestine without perforation or abscess without bleeding: Secondary | ICD-10-CM | POA: Diagnosis not present

## 2020-12-11 DIAGNOSIS — I714 Abdominal aortic aneurysm, without rupture, unspecified: Secondary | ICD-10-CM | POA: Diagnosis not present

## 2020-12-11 DIAGNOSIS — B9689 Other specified bacterial agents as the cause of diseases classified elsewhere: Secondary | ICD-10-CM | POA: Diagnosis not present

## 2020-12-11 DIAGNOSIS — Z9181 History of falling: Secondary | ICD-10-CM | POA: Diagnosis not present

## 2020-12-11 DIAGNOSIS — M47812 Spondylosis without myelopathy or radiculopathy, cervical region: Secondary | ICD-10-CM | POA: Diagnosis not present

## 2020-12-11 DIAGNOSIS — I10 Essential (primary) hypertension: Secondary | ICD-10-CM | POA: Diagnosis not present

## 2020-12-11 DIAGNOSIS — E1151 Type 2 diabetes mellitus with diabetic peripheral angiopathy without gangrene: Secondary | ICD-10-CM | POA: Diagnosis not present

## 2020-12-12 DIAGNOSIS — L02416 Cutaneous abscess of left lower limb: Secondary | ICD-10-CM | POA: Diagnosis not present

## 2020-12-12 DIAGNOSIS — M5416 Radiculopathy, lumbar region: Secondary | ICD-10-CM | POA: Diagnosis not present

## 2020-12-12 DIAGNOSIS — K5732 Diverticulitis of large intestine without perforation or abscess without bleeding: Secondary | ICD-10-CM | POA: Diagnosis not present

## 2020-12-12 DIAGNOSIS — M10071 Idiopathic gout, right ankle and foot: Secondary | ICD-10-CM | POA: Diagnosis not present

## 2020-12-12 DIAGNOSIS — B9689 Other specified bacterial agents as the cause of diseases classified elsewhere: Secondary | ICD-10-CM | POA: Diagnosis not present

## 2020-12-12 DIAGNOSIS — Z7901 Long term (current) use of anticoagulants: Secondary | ICD-10-CM | POA: Diagnosis not present

## 2020-12-12 DIAGNOSIS — I10 Essential (primary) hypertension: Secondary | ICD-10-CM | POA: Diagnosis not present

## 2020-12-12 DIAGNOSIS — E1151 Type 2 diabetes mellitus with diabetic peripheral angiopathy without gangrene: Secondary | ICD-10-CM | POA: Diagnosis not present

## 2020-12-12 DIAGNOSIS — Z9181 History of falling: Secondary | ICD-10-CM | POA: Diagnosis not present

## 2020-12-12 DIAGNOSIS — D696 Thrombocytopenia, unspecified: Secondary | ICD-10-CM | POA: Diagnosis not present

## 2020-12-12 DIAGNOSIS — L03116 Cellulitis of left lower limb: Secondary | ICD-10-CM | POA: Diagnosis not present

## 2020-12-12 DIAGNOSIS — I480 Paroxysmal atrial fibrillation: Secondary | ICD-10-CM | POA: Diagnosis not present

## 2020-12-12 DIAGNOSIS — I714 Abdominal aortic aneurysm, without rupture, unspecified: Secondary | ICD-10-CM | POA: Diagnosis not present

## 2020-12-12 DIAGNOSIS — Z79891 Long term (current) use of opiate analgesic: Secondary | ICD-10-CM | POA: Diagnosis not present

## 2020-12-12 DIAGNOSIS — T8149XA Infection following a procedure, other surgical site, initial encounter: Secondary | ICD-10-CM | POA: Diagnosis not present

## 2020-12-12 DIAGNOSIS — E785 Hyperlipidemia, unspecified: Secondary | ICD-10-CM | POA: Diagnosis not present

## 2020-12-12 DIAGNOSIS — M47812 Spondylosis without myelopathy or radiculopathy, cervical region: Secondary | ICD-10-CM | POA: Diagnosis not present

## 2020-12-15 DIAGNOSIS — Z9181 History of falling: Secondary | ICD-10-CM | POA: Diagnosis not present

## 2020-12-15 DIAGNOSIS — E785 Hyperlipidemia, unspecified: Secondary | ICD-10-CM | POA: Diagnosis not present

## 2020-12-15 DIAGNOSIS — Z79891 Long term (current) use of opiate analgesic: Secondary | ICD-10-CM | POA: Diagnosis not present

## 2020-12-15 DIAGNOSIS — I714 Abdominal aortic aneurysm, without rupture, unspecified: Secondary | ICD-10-CM | POA: Diagnosis not present

## 2020-12-15 DIAGNOSIS — E1151 Type 2 diabetes mellitus with diabetic peripheral angiopathy without gangrene: Secondary | ICD-10-CM | POA: Diagnosis not present

## 2020-12-15 DIAGNOSIS — I10 Essential (primary) hypertension: Secondary | ICD-10-CM | POA: Diagnosis not present

## 2020-12-15 DIAGNOSIS — Z7901 Long term (current) use of anticoagulants: Secondary | ICD-10-CM | POA: Diagnosis not present

## 2020-12-15 DIAGNOSIS — M10071 Idiopathic gout, right ankle and foot: Secondary | ICD-10-CM | POA: Diagnosis not present

## 2020-12-15 DIAGNOSIS — K5732 Diverticulitis of large intestine without perforation or abscess without bleeding: Secondary | ICD-10-CM | POA: Diagnosis not present

## 2020-12-15 DIAGNOSIS — L03116 Cellulitis of left lower limb: Secondary | ICD-10-CM | POA: Diagnosis not present

## 2020-12-15 DIAGNOSIS — M5416 Radiculopathy, lumbar region: Secondary | ICD-10-CM | POA: Diagnosis not present

## 2020-12-15 DIAGNOSIS — T8149XA Infection following a procedure, other surgical site, initial encounter: Secondary | ICD-10-CM | POA: Diagnosis not present

## 2020-12-15 DIAGNOSIS — B9689 Other specified bacterial agents as the cause of diseases classified elsewhere: Secondary | ICD-10-CM | POA: Diagnosis not present

## 2020-12-15 DIAGNOSIS — L02416 Cutaneous abscess of left lower limb: Secondary | ICD-10-CM | POA: Diagnosis not present

## 2020-12-15 DIAGNOSIS — D696 Thrombocytopenia, unspecified: Secondary | ICD-10-CM | POA: Diagnosis not present

## 2020-12-15 DIAGNOSIS — I480 Paroxysmal atrial fibrillation: Secondary | ICD-10-CM | POA: Diagnosis not present

## 2020-12-15 DIAGNOSIS — M47812 Spondylosis without myelopathy or radiculopathy, cervical region: Secondary | ICD-10-CM | POA: Diagnosis not present

## 2020-12-16 ENCOUNTER — Telehealth: Payer: Self-pay

## 2020-12-16 ENCOUNTER — Telehealth: Payer: Self-pay | Admitting: Family Medicine

## 2020-12-16 NOTE — Telephone Encounter (Signed)
Returned Starla's call she was calling for the name of patients wound doc but she got it and verified with patients daughter.

## 2020-12-16 NOTE — Telephone Encounter (Signed)
Chelsea Dean with [facility] called to let us know pt's wound has had tremendous improvement and wound vac is no longer needed. They are doing wet to dry dressing changes. Pt is returning for f/u this week with [facility].

## 2020-12-17 ENCOUNTER — Encounter: Payer: Self-pay | Admitting: Physician Assistant

## 2020-12-17 ENCOUNTER — Ambulatory Visit (INDEPENDENT_AMBULATORY_CARE_PROVIDER_SITE_OTHER): Payer: Medicare Other | Admitting: Physician Assistant

## 2020-12-17 ENCOUNTER — Other Ambulatory Visit: Payer: Self-pay

## 2020-12-17 VITALS — BP 141/72 | HR 65 | Temp 96.8°F | Resp 20 | Ht 62.0 in | Wt 165.0 lb

## 2020-12-17 DIAGNOSIS — L02416 Cutaneous abscess of left lower limb: Secondary | ICD-10-CM

## 2020-12-17 NOTE — Progress Notes (Signed)
POST OPERATIVE OFFICE NOTE    CC:  F/u for surgery  HPI:    She has remote history of abdominal aortic aneurysm repair (endovascular repair by Dr. Maryjean Morn in [facility]) with known occluded left limb of the aneurysm graft.  she had hx of Last February she was undergoing coil embolization of an endoleak subsequently ended up with left lower extremity embolic disease and required embolectomy from a below-knee popliteal exposure site.  After this she had posterior tibial signals she was discharged followed up in our office where she was thought not to have symptomatic occlusion of her left iliac limb and did not require right to left femorofemoral bypass.   A few weeks ago,  she presented with cellulitis of her left lower extremity required needle drainage and was placed on antibiotics.  She was then admitted to the hospital with the same problem.  [facility] was asked to evaluate pt for recurrent cellulitis of the left lower extremity with known occluded left aortic aneurysm repair limb.  At that time, it was reported that she had not walked any significant distances since surgery in February.   Dr. Donzetta Matters recommended I&D of the area.  On 11/26/2020, she was taken to the operating room and underwent I&D of the left calf and mechanical debridement of wound bed with wound vac placement by Dr. Stanford Breed.  Her wound cx gre moderate serratia marcescens and she was placed on a course of abx.  She was discharged home with wound vac and comes in today for follow up.  HHRN called yesterday to say there was tremendous improvement in the wound and the vac was discontinued and wet to dry dressings were placed.   Pt here with her daughter and states her wound has healed nicely and she showed me a picture of it after the I&D.  It is essentially healed.  Pt has been followed by [facility] for her endovascular repair.  The pt denies any pain in her feet or legs.  She is working with PT and OT at home.    Allergies   Allergen Reactions   Ace Inhibitors Shortness Of Breath and Rash   Contrast Media [Iodinated Diagnostic Agents] Shortness Of Breath and Rash   Nsaids     Unknown reaction    Current Outpatient Medications  Medication Sig Dispense Refill   acetaminophen (TYLENOL) 650 MG CR tablet Take 650-1,300 mg by mouth every 8 (eight) hours as needed for pain.     amLODipine (NORVASC) 10 MG tablet TAKE 1 TABLET BY MOUTH EVERY DAY FOR BLOOD PRESSURE 90 tablet 1   atorvastatin (LIPITOR) 10 MG tablet TAKE 1 TABLET BY MOUTH EVERY DAY 90 tablet 3   diclofenac sodium (VOLTAREN) 1 % GEL Apply 2 g topically daily as needed (KNEE PAIN).     ELIQUIS 5 MG TABS tablet TAKE 1 TABLET BY MOUTH TWICE DAILY FOR 30 DAYS (02/13/20 TO INDEFINITELY) 60 tablet 5   glipiZIDE (GLUCOTROL XL) 10 MG 24 hr tablet TAKE 1 TABLET BY MOUTH EVERY DAY WITH BREAKFAST 90 tablet 2   losartan (COZAAR) 100 MG tablet TAKE 1 TABLET BY MOUTH EVERY DAY 90 tablet 1   metoprolol tartrate (LOPRESSOR) 50 MG tablet Take 1 tablet (50 mg total) by mouth 2 (two) times daily. 180 tablet 3   traMADol (ULTRAM) 50 MG tablet TAKE 1 TABLET BY MOUTH EVERY 12 HOURS ASNEEDED FOR PAIN 60 tablet 0   traZODone (DESYREL) 50 MG tablet 1/2 po q hs prn  sleep 30 tablet 1   No current facility-administered medications for this visit.     ROS:  See HPI  Physical Exam:  Today's Vitals   12/17/20 1002  BP: (!) 141/72  Pulse: 65  Resp: 20  Temp: (!) 96.8 F (36 C)  TempSrc: Temporal  SpO2: 96%  Weight: 165 lb (74.8 kg)  Height: 5\' 2"  (1.575 m)   Body mass index is 30.18 kg/m.   Incision:     Extremities:  monophasic doppler signals left PT/peroneal and palpable right DP    Assessment/Plan:  This is a 87 y.o. female who is s/p: I&D of the left calf and mechanical debridement of wound bed with wound vac placement by Dr. Stanford Breed on 11/26/2020  -her wound has essentially healed. Discussed with daughter that she can shower  daily with soap and water and  place dry dressing to wound. -given her EVAR surveillance has been in [facility], she should continue follow up with them.  She last saw Retia Passe and the number was given to the pt's daughter.  -given her wound has healed and she follows in [facility], we will see her back as needed.  Pt's daughter is in agreement.     Leontine Locket, [facility] [phone]   Clinic MD:  Donzetta Matters

## 2020-12-18 ENCOUNTER — Ambulatory Visit (INDEPENDENT_AMBULATORY_CARE_PROVIDER_SITE_OTHER): Payer: Medicare Other | Admitting: Family Medicine

## 2020-12-18 ENCOUNTER — Encounter: Payer: Self-pay | Admitting: Family Medicine

## 2020-12-18 VITALS — BP 118/68 | HR 50 | Temp 97.0°F | Ht 62.0 in | Wt 154.1 lb

## 2020-12-18 DIAGNOSIS — Z79891 Long term (current) use of opiate analgesic: Secondary | ICD-10-CM | POA: Diagnosis not present

## 2020-12-18 DIAGNOSIS — M5416 Radiculopathy, lumbar region: Secondary | ICD-10-CM | POA: Diagnosis not present

## 2020-12-18 DIAGNOSIS — Z9181 History of falling: Secondary | ICD-10-CM | POA: Diagnosis not present

## 2020-12-18 DIAGNOSIS — M25561 Pain in right knee: Secondary | ICD-10-CM

## 2020-12-18 DIAGNOSIS — G8929 Other chronic pain: Secondary | ICD-10-CM

## 2020-12-18 DIAGNOSIS — N1831 Chronic kidney disease, stage 3a: Secondary | ICD-10-CM | POA: Diagnosis not present

## 2020-12-18 DIAGNOSIS — I480 Paroxysmal atrial fibrillation: Secondary | ICD-10-CM | POA: Diagnosis not present

## 2020-12-18 DIAGNOSIS — D696 Thrombocytopenia, unspecified: Secondary | ICD-10-CM | POA: Diagnosis not present

## 2020-12-18 DIAGNOSIS — I714 Abdominal aortic aneurysm, without rupture, unspecified: Secondary | ICD-10-CM | POA: Diagnosis not present

## 2020-12-18 DIAGNOSIS — B9689 Other specified bacterial agents as the cause of diseases classified elsewhere: Secondary | ICD-10-CM | POA: Diagnosis not present

## 2020-12-18 DIAGNOSIS — L03116 Cellulitis of left lower limb: Secondary | ICD-10-CM | POA: Diagnosis not present

## 2020-12-18 DIAGNOSIS — E119 Type 2 diabetes mellitus without complications: Secondary | ICD-10-CM

## 2020-12-18 DIAGNOSIS — M10071 Idiopathic gout, right ankle and foot: Secondary | ICD-10-CM | POA: Diagnosis not present

## 2020-12-18 DIAGNOSIS — I10 Essential (primary) hypertension: Secondary | ICD-10-CM | POA: Diagnosis not present

## 2020-12-18 DIAGNOSIS — L02416 Cutaneous abscess of left lower limb: Secondary | ICD-10-CM | POA: Diagnosis not present

## 2020-12-18 DIAGNOSIS — E1151 Type 2 diabetes mellitus with diabetic peripheral angiopathy without gangrene: Secondary | ICD-10-CM | POA: Diagnosis not present

## 2020-12-18 DIAGNOSIS — Z7901 Long term (current) use of anticoagulants: Secondary | ICD-10-CM | POA: Diagnosis not present

## 2020-12-18 DIAGNOSIS — E785 Hyperlipidemia, unspecified: Secondary | ICD-10-CM | POA: Diagnosis not present

## 2020-12-18 DIAGNOSIS — M47812 Spondylosis without myelopathy or radiculopathy, cervical region: Secondary | ICD-10-CM | POA: Diagnosis not present

## 2020-12-18 DIAGNOSIS — M25562 Pain in left knee: Secondary | ICD-10-CM

## 2020-12-18 DIAGNOSIS — T8149XA Infection following a procedure, other surgical site, initial encounter: Secondary | ICD-10-CM | POA: Diagnosis not present

## 2020-12-18 DIAGNOSIS — K5732 Diverticulitis of large intestine without perforation or abscess without bleeding: Secondary | ICD-10-CM | POA: Diagnosis not present

## 2020-12-18 MED ORDER — EMPAGLIFLOZIN 10 MG PO TABS: 10.0000 mg | ORAL_TABLET | Freq: Every day | ORAL | 3 refills | Status: DC

## 2020-12-18 NOTE — Progress Notes (Signed)
Established Patient Office Visit  Subjective:  Patient ID: Chelsea Dean, female    DOB: Jan 07, 1928  Age: 87 y.o. MRN: [id number]  CC:  Chief Complaint  Patient presents with   Follow-up    3 month follow up concerns about glucose levels dropping would like to discuss possible cortisone injection in left knee for pain.     HPI Chelsea Dean presents for evaluation after recent episodes of severe hypoglycemia.  EMS was summoned and her blood sugar was 40.  Glucotrol XL has worked well for her for years.  She typically takes it with breakfast which is her largest meal of the day.  She has not been eating as well recently status post recent hospitalization.  History of severe osteoarthritis of both knees.  Left knee has been bothering her.  Her wound from recent I&D of left leg abscess is almost healed.  History of stage IIIa CKD.  Past Medical History:  Diagnosis Date   A-fib ([place]) 10/19/2016   Abdominal aortic aneurysm (AAA) without rupture 09/15/2013   Acute idiopathic gout of right foot    AKI (acute kidney injury) ([place]) 10/18/2016   Anticoagulated on Coumadin    Arthritis    Diabetes mellitus without complication (HCC)    Diverticular disease of large intestine 04/16/2013   Diverticulosis    DM2 (diabetes mellitus, type 2) (HCC) 10/19/2016   Dysrhythmia    Atrial fib   History of colon polyps 04/16/2013   Hx of colonic polyps    Hyperlipidemia    Hypertension    Long term current use of anticoagulant therapy 02/05/2015   Lumbar radiculopathy 03/28/2014   Microalbuminuria 04/16/2013   Osteoarthritis 04/16/2013   Osteoarthritis of cervical spine 03/28/2014   Pelvic mass 10/19/2016   Right renal mass    SBO (small bowel obstruction) ([place]) 10/18/2016   Thrombocytopenia ([place]) 04/16/2013   Overview:  referred to hematology 01/2104    Past Surgical History:  Procedure Laterality Date   ABDOMINAL AORTIC ANEURYSM REPAIR     ABDOMINAL HYSTERECTOMY     APPLICATION OF WOUND  VAC Left 11/26/2020   Procedure: APPLICATION OF WOUND VAC;  Surgeon: Cherre Robins, MD;  Location: [facility];  Service: Vascular;  Laterality: Left;   INCISION AND DRAINAGE Left 11/26/2020   Procedure: INCISION AND DRAINAGE OF LEFT LEG;  Surgeon: Cherre Robins, MD;  Location: [facility] OR;  Service: Vascular;  Laterality: Left;   IR ANGIOGRAM EXTREMITY LEFT  04/01/2020   IR ANGIOGRAM PELVIS SELECTIVE OR SUPRASELECTIVE  04/01/2020   IR ANGIOGRAM SELECTIVE EACH ADDITIONAL VESSEL  04/01/2020   IR ANGIOGRAM SELECTIVE EACH ADDITIONAL VESSEL  04/01/2020   IR ANGIOGRAM SELECTIVE EACH ADDITIONAL VESSEL  04/01/2020   IR AORTAGRAM ABDOMINAL SERIALOGRAM  04/01/2020   IR EMBO ARTERIAL NOT HEMORR HEMANG INC GUIDE ROADMAPPING  04/01/2020   IR GENERIC HISTORICAL  07/01/2015   IR RADIOLOGIST EVAL & MGMT 07/01/2015 [facility] INTERV RAD   IR RADIOLOGIST EVAL & MGMT  03/11/2020   IR RADIOLOGIST EVAL & MGMT  04/30/2020   IR THROMBECT PRIM MECH INIT (INCLU) MOD SED  04/01/2020   IR US GUIDE VASC ACCESS LEFT  04/01/2020   IR US GUIDE VASC ACCESS LEFT  04/01/2020   IR US GUIDE VASC ACCESS RIGHT  04/01/2020   THROMBECTOMY FEMORAL ARTERY Left 04/01/2020   Procedure: THROMBECTOMY LEFT POPLITEAL ARTERY;  Surgeon: Rosetta Posner, MD;  Location: [facility] OR;  Service: Vascular;  Laterality: Left;    Family History  Problem Relation  Age of Onset   Cancer Sister        Rectal and Stomach    Social History   Socioeconomic History   Marital status: Married    Spouse name: Not on file   Number of children: Not on file   Years of education: Not on file   Highest education level: Not on file  Occupational History   Occupation: retired [employer]  Tobacco Use   Smoking status: Never   Smokeless tobacco: Current    Types: Snuff  Vaping Use   Vaping Use: Never used  Substance and Sexual Activity   Alcohol use: No    Alcohol/week: 0.0 standard drinks   Drug use: No   Sexual activity: Not on file  Other Topics Concern   Not on file   Social History Narrative   Admitted to [facility] 10/27/16   Married   Use snuff   Alcohol none   DNR   Social Determinants of Health   Financial Resource Strain: Low Risk    Difficulty of Paying Living Expenses: Not hard at all  Food Insecurity: No Food Insecurity   Worried About Charity fundraiser in the Last Year: Never true   [place] in the Last Year: Never true  Transportation Needs: No Transportation Needs   Lack of Transportation (Medical): No   Lack of Transportation (Non-Medical): No  Physical Activity: Insufficiently Active   Days of Exercise per Week: 3 days   Minutes of Exercise per Session: 10 min  Stress: No Stress Concern Present   Feeling of Stress : Not at all  Social Connections: Socially Isolated   Frequency of Communication with Friends and Family: Three times a week   Frequency of Social Gatherings with Friends and Family: Once a week   Attends Religious Services: Never   Marine scientist or Organizations: No   Attends Archivist Meetings: Never   Marital Status: Widowed  Human resources officer Violence: Not At Risk   Fear of Current or Ex-Partner: No   Emotionally Abused: No   Physically Abused: No   Sexually Abused: No    Outpatient Medications Prior to Visit  Medication Sig Dispense Refill   acetaminophen (TYLENOL) 650 MG CR tablet Take 650-1,300 mg by mouth every 8 (eight) hours as needed for pain.     amLODipine (NORVASC) 10 MG tablet TAKE 1 TABLET BY MOUTH EVERY DAY FOR BLOOD PRESSURE 90 tablet 1   atorvastatin (LIPITOR) 10 MG tablet TAKE 1 TABLET BY MOUTH EVERY DAY 90 tablet 3   diclofenac sodium (VOLTAREN) 1 % GEL Apply 2 g topically daily as needed (KNEE PAIN).     ELIQUIS 5 MG TABS tablet TAKE 1 TABLET BY MOUTH TWICE DAILY FOR 30 DAYS (02/13/20 TO INDEFINITELY) 60 tablet 5   losartan (COZAAR) 100 MG tablet TAKE 1 TABLET BY MOUTH EVERY DAY 90 tablet 1   metoprolol tartrate (LOPRESSOR) 50 MG tablet Take 1  tablet (50 mg total) by mouth 2 (two) times daily. 180 tablet 3   traMADol (ULTRAM) 50 MG tablet TAKE 1 TABLET BY MOUTH EVERY 12 HOURS ASNEEDED FOR PAIN 60 tablet 0   traZODone (DESYREL) 50 MG tablet 1/2 po q hs prn sleep 30 tablet 1   glipiZIDE (GLUCOTROL XL) 10 MG 24 hr tablet TAKE 1 TABLET BY MOUTH EVERY DAY WITH BREAKFAST 90 tablet 2   No facility-administered medications prior to visit.    Allergies  Allergen Reactions  Ace Inhibitors Shortness Of Breath and Rash   Contrast Media [Iodinated Diagnostic Agents] Shortness Of Breath and Rash   Nsaids     Unknown reaction    ROS Review of Systems  Constitutional:  Negative for diaphoresis, fatigue, fever and unexpected weight change.  HENT:  Positive for hearing loss.   Eyes:  Negative for photophobia and visual disturbance.  Respiratory: Negative.    Cardiovascular: Negative.   Gastrointestinal: Negative.   Endocrine: Negative for polyphagia and polyuria.  Genitourinary:  Negative for difficulty urinating, frequency and urgency.  Musculoskeletal:  Positive for arthralgias and gait problem.  Neurological:  Negative for speech difficulty and weakness.     Objective:    Physical Exam Vitals and nursing note reviewed.  Constitutional:      General: She is not in acute distress.    Appearance: Normal appearance. She is not ill-appearing, toxic-appearing or diaphoretic.  HENT:     Head: Normocephalic and atraumatic.     Right Ear: External ear normal.     Left Ear: External ear normal.  Eyes:     General:        Right eye: No discharge.        Left eye: No discharge.     Extraocular Movements: Extraocular movements intact.     Conjunctiva/sclera: Conjunctivae normal.  Cardiovascular:     Rate and Rhythm: Normal rate and regular rhythm.  Pulmonary:     Effort: Pulmonary effort is normal.     Breath sounds: Normal breath sounds.  Musculoskeletal:     Left knee: Swelling present. No erythema.     Right lower leg: No  edema.     Left lower leg: No edema.  Neurological:     Mental Status: She is alert.  Psychiatric:        Mood and Affect: Mood normal.        Behavior: Behavior normal.    BP 118/68 (BP Location: Left Arm, Patient Position: Sitting, Cuff Size: Normal)   Pulse (!) 50   Temp (!) 97 F (36.1 C) (Temporal)   Ht 5\' 2"  (1.575 m)   Wt 154 lb 1.6 oz (69.9 kg)   SpO2 96%   BMI 28.19 kg/m  Wt Readings from Last 3 Encounters:  12/18/20 154 lb 1.6 oz (69.9 kg)  12/17/20 165 lb (74.8 kg)  12/04/20 165 lb (74.8 kg)     Health Maintenance Due  Topic Date Due   TETANUS/TDAP  Never done   Zoster Vaccines- Shingrix (1 of 2) Never done   DEXA SCAN  Never done    There are no preventive care reminders to display for this patient.  Lab Results  Component Value Date   TSH 3.26 03/02/2017   Lab Results  Component Value Date   WBC 6.5 12/04/2020   HGB 11.1 (L) 12/04/2020   HCT 35.5 (L) 12/04/2020   MCV 82.9 12/04/2020   PLT 180.0 12/04/2020   Lab Results  Component Value Date   NA 140 11/28/2020   K 3.8 11/28/2020   CO2 29 11/28/2020   GLUCOSE 102 (H) 11/28/2020   BUN 21 11/28/2020   CREATININE 1.16 (H) 11/28/2020   BILITOT 0.8 11/24/2020   ALKPHOS 41 11/24/2020   AST 19 11/24/2020   ALT 15 11/24/2020   PROT 6.8 11/24/2020   ALBUMIN 3.2 (L) 11/24/2020   CALCIUM 10.0 11/28/2020   ANIONGAP 8 11/28/2020   GFR 32.92 (L) 09/18/2020   Lab Results  Component Value Date  CHOL 134 06/06/2020   Lab Results  Component Value Date   HDL 33.10 (L) 06/06/2020   Lab Results  Component Value Date   LDLCALC 73 06/06/2020   Lab Results  Component Value Date   TRIG 135.0 06/06/2020   Lab Results  Component Value Date   CHOLHDL 4 06/06/2020   Lab Results  Component Value Date   HGBA1C 5.7 (H) 11/24/2020      Assessment & Plan:   Problem List Items Addressed This Visit       Endocrine   Type 2 diabetes mellitus without complication, without long-term current use of  insulin (HCC) - Primary   Relevant Medications   empagliflozin (JARDIANCE) 10 MG TABS tablet     Genitourinary   Stage 3 chronic kidney disease (HCC)   Relevant Medications   empagliflozin (JARDIANCE) 10 MG TABS tablet     Other   Chronic pain of both knees    Meds ordered this encounter  Medications   empagliflozin (JARDIANCE) 10 MG TABS tablet    Sig: Take 1 tablet (10 mg total) by mouth daily before breakfast.    Dispense:  30 tablet    Refill:  3    Please administer Shingrix vaccination.    Follow-up: Return in about 3 months (around 03/20/2021).   Believe that this is a good time to discontinue Glucotrol, although it has worked well for her for years.  Jardiance could be a much better choice for her to help with her kidney function and avoid hypoglycemic episodes.  Daughter aware that it may lead to increased frequency of urination.  If not tolerated could try dietary therapy only.  We will monitor. [name], MD

## 2020-12-19 ENCOUNTER — Other Ambulatory Visit: Payer: Self-pay | Admitting: Family Medicine

## 2020-12-19 ENCOUNTER — Ambulatory Visit: Payer: Medicare Other | Admitting: Family Medicine

## 2020-12-19 ENCOUNTER — Other Ambulatory Visit: Payer: Self-pay | Admitting: Family

## 2020-12-19 DIAGNOSIS — M5416 Radiculopathy, lumbar region: Secondary | ICD-10-CM | POA: Diagnosis not present

## 2020-12-19 DIAGNOSIS — I10 Essential (primary) hypertension: Secondary | ICD-10-CM | POA: Diagnosis not present

## 2020-12-19 DIAGNOSIS — L03116 Cellulitis of left lower limb: Secondary | ICD-10-CM | POA: Diagnosis not present

## 2020-12-19 DIAGNOSIS — I714 Abdominal aortic aneurysm, without rupture, unspecified: Secondary | ICD-10-CM | POA: Diagnosis not present

## 2020-12-19 DIAGNOSIS — T8149XA Infection following a procedure, other surgical site, initial encounter: Secondary | ICD-10-CM | POA: Diagnosis not present

## 2020-12-19 DIAGNOSIS — B9689 Other specified bacterial agents as the cause of diseases classified elsewhere: Secondary | ICD-10-CM | POA: Diagnosis not present

## 2020-12-19 DIAGNOSIS — K5732 Diverticulitis of large intestine without perforation or abscess without bleeding: Secondary | ICD-10-CM | POA: Diagnosis not present

## 2020-12-19 DIAGNOSIS — Z7901 Long term (current) use of anticoagulants: Secondary | ICD-10-CM | POA: Diagnosis not present

## 2020-12-19 DIAGNOSIS — L02416 Cutaneous abscess of left lower limb: Secondary | ICD-10-CM | POA: Diagnosis not present

## 2020-12-19 DIAGNOSIS — Z9181 History of falling: Secondary | ICD-10-CM | POA: Diagnosis not present

## 2020-12-19 DIAGNOSIS — E1151 Type 2 diabetes mellitus with diabetic peripheral angiopathy without gangrene: Secondary | ICD-10-CM | POA: Diagnosis not present

## 2020-12-19 DIAGNOSIS — Z79891 Long term (current) use of opiate analgesic: Secondary | ICD-10-CM | POA: Diagnosis not present

## 2020-12-19 DIAGNOSIS — M47812 Spondylosis without myelopathy or radiculopathy, cervical region: Secondary | ICD-10-CM | POA: Diagnosis not present

## 2020-12-19 DIAGNOSIS — E785 Hyperlipidemia, unspecified: Secondary | ICD-10-CM | POA: Diagnosis not present

## 2020-12-19 DIAGNOSIS — M10071 Idiopathic gout, right ankle and foot: Secondary | ICD-10-CM | POA: Diagnosis not present

## 2020-12-19 DIAGNOSIS — D696 Thrombocytopenia, unspecified: Secondary | ICD-10-CM | POA: Diagnosis not present

## 2020-12-19 DIAGNOSIS — I480 Paroxysmal atrial fibrillation: Secondary | ICD-10-CM | POA: Diagnosis not present

## 2020-12-26 DIAGNOSIS — L03116 Cellulitis of left lower limb: Secondary | ICD-10-CM | POA: Diagnosis not present

## 2020-12-26 DIAGNOSIS — I480 Paroxysmal atrial fibrillation: Secondary | ICD-10-CM | POA: Diagnosis not present

## 2020-12-26 DIAGNOSIS — I10 Essential (primary) hypertension: Secondary | ICD-10-CM | POA: Diagnosis not present

## 2020-12-26 DIAGNOSIS — Z9181 History of falling: Secondary | ICD-10-CM | POA: Diagnosis not present

## 2020-12-26 DIAGNOSIS — L02416 Cutaneous abscess of left lower limb: Secondary | ICD-10-CM | POA: Diagnosis not present

## 2020-12-26 DIAGNOSIS — M5416 Radiculopathy, lumbar region: Secondary | ICD-10-CM | POA: Diagnosis not present

## 2020-12-26 DIAGNOSIS — T8149XA Infection following a procedure, other surgical site, initial encounter: Secondary | ICD-10-CM | POA: Diagnosis not present

## 2020-12-26 DIAGNOSIS — Z79891 Long term (current) use of opiate analgesic: Secondary | ICD-10-CM | POA: Diagnosis not present

## 2020-12-26 DIAGNOSIS — B9689 Other specified bacterial agents as the cause of diseases classified elsewhere: Secondary | ICD-10-CM | POA: Diagnosis not present

## 2020-12-26 DIAGNOSIS — E1151 Type 2 diabetes mellitus with diabetic peripheral angiopathy without gangrene: Secondary | ICD-10-CM | POA: Diagnosis not present

## 2020-12-26 DIAGNOSIS — K5732 Diverticulitis of large intestine without perforation or abscess without bleeding: Secondary | ICD-10-CM | POA: Diagnosis not present

## 2020-12-26 DIAGNOSIS — Z7901 Long term (current) use of anticoagulants: Secondary | ICD-10-CM | POA: Diagnosis not present

## 2020-12-26 DIAGNOSIS — I714 Abdominal aortic aneurysm, without rupture, unspecified: Secondary | ICD-10-CM | POA: Diagnosis not present

## 2020-12-26 DIAGNOSIS — M10071 Idiopathic gout, right ankle and foot: Secondary | ICD-10-CM | POA: Diagnosis not present

## 2020-12-26 DIAGNOSIS — E785 Hyperlipidemia, unspecified: Secondary | ICD-10-CM | POA: Diagnosis not present

## 2020-12-26 DIAGNOSIS — D696 Thrombocytopenia, unspecified: Secondary | ICD-10-CM | POA: Diagnosis not present

## 2020-12-26 DIAGNOSIS — M47812 Spondylosis without myelopathy or radiculopathy, cervical region: Secondary | ICD-10-CM | POA: Diagnosis not present

## 2020-12-29 DIAGNOSIS — T8149XA Infection following a procedure, other surgical site, initial encounter: Secondary | ICD-10-CM | POA: Diagnosis not present

## 2020-12-29 DIAGNOSIS — K5732 Diverticulitis of large intestine without perforation or abscess without bleeding: Secondary | ICD-10-CM | POA: Diagnosis not present

## 2020-12-29 DIAGNOSIS — M47812 Spondylosis without myelopathy or radiculopathy, cervical region: Secondary | ICD-10-CM | POA: Diagnosis not present

## 2020-12-29 DIAGNOSIS — E785 Hyperlipidemia, unspecified: Secondary | ICD-10-CM | POA: Diagnosis not present

## 2020-12-29 DIAGNOSIS — D696 Thrombocytopenia, unspecified: Secondary | ICD-10-CM | POA: Diagnosis not present

## 2020-12-29 DIAGNOSIS — L03116 Cellulitis of left lower limb: Secondary | ICD-10-CM | POA: Diagnosis not present

## 2020-12-29 DIAGNOSIS — M10071 Idiopathic gout, right ankle and foot: Secondary | ICD-10-CM | POA: Diagnosis not present

## 2020-12-29 DIAGNOSIS — M5416 Radiculopathy, lumbar region: Secondary | ICD-10-CM | POA: Diagnosis not present

## 2020-12-29 DIAGNOSIS — E1151 Type 2 diabetes mellitus with diabetic peripheral angiopathy without gangrene: Secondary | ICD-10-CM | POA: Diagnosis not present

## 2020-12-29 DIAGNOSIS — I480 Paroxysmal atrial fibrillation: Secondary | ICD-10-CM | POA: Diagnosis not present

## 2020-12-29 DIAGNOSIS — Z79891 Long term (current) use of opiate analgesic: Secondary | ICD-10-CM | POA: Diagnosis not present

## 2020-12-29 DIAGNOSIS — B9689 Other specified bacterial agents as the cause of diseases classified elsewhere: Secondary | ICD-10-CM | POA: Diagnosis not present

## 2020-12-29 DIAGNOSIS — Z7901 Long term (current) use of anticoagulants: Secondary | ICD-10-CM | POA: Diagnosis not present

## 2020-12-29 DIAGNOSIS — Z9181 History of falling: Secondary | ICD-10-CM | POA: Diagnosis not present

## 2020-12-29 DIAGNOSIS — L02416 Cutaneous abscess of left lower limb: Secondary | ICD-10-CM | POA: Diagnosis not present

## 2020-12-29 DIAGNOSIS — I10 Essential (primary) hypertension: Secondary | ICD-10-CM | POA: Diagnosis not present

## 2020-12-29 DIAGNOSIS — I714 Abdominal aortic aneurysm, without rupture, unspecified: Secondary | ICD-10-CM | POA: Diagnosis not present

## 2020-12-31 ENCOUNTER — Telehealth: Payer: Self-pay

## 2020-12-31 DIAGNOSIS — T8149XA Infection following a procedure, other surgical site, initial encounter: Secondary | ICD-10-CM | POA: Diagnosis not present

## 2020-12-31 DIAGNOSIS — L02416 Cutaneous abscess of left lower limb: Secondary | ICD-10-CM | POA: Diagnosis not present

## 2020-12-31 DIAGNOSIS — I480 Paroxysmal atrial fibrillation: Secondary | ICD-10-CM | POA: Diagnosis not present

## 2020-12-31 DIAGNOSIS — I714 Abdominal aortic aneurysm, without rupture, unspecified: Secondary | ICD-10-CM | POA: Diagnosis not present

## 2020-12-31 DIAGNOSIS — Z79891 Long term (current) use of opiate analgesic: Secondary | ICD-10-CM | POA: Diagnosis not present

## 2020-12-31 DIAGNOSIS — L03116 Cellulitis of left lower limb: Secondary | ICD-10-CM | POA: Diagnosis not present

## 2020-12-31 DIAGNOSIS — E1151 Type 2 diabetes mellitus with diabetic peripheral angiopathy without gangrene: Secondary | ICD-10-CM | POA: Diagnosis not present

## 2020-12-31 DIAGNOSIS — Z9181 History of falling: Secondary | ICD-10-CM | POA: Diagnosis not present

## 2020-12-31 DIAGNOSIS — M5416 Radiculopathy, lumbar region: Secondary | ICD-10-CM | POA: Diagnosis not present

## 2020-12-31 DIAGNOSIS — Z7901 Long term (current) use of anticoagulants: Secondary | ICD-10-CM | POA: Diagnosis not present

## 2020-12-31 DIAGNOSIS — I10 Essential (primary) hypertension: Secondary | ICD-10-CM | POA: Diagnosis not present

## 2020-12-31 DIAGNOSIS — E785 Hyperlipidemia, unspecified: Secondary | ICD-10-CM | POA: Diagnosis not present

## 2020-12-31 DIAGNOSIS — B9689 Other specified bacterial agents as the cause of diseases classified elsewhere: Secondary | ICD-10-CM | POA: Diagnosis not present

## 2020-12-31 DIAGNOSIS — M10071 Idiopathic gout, right ankle and foot: Secondary | ICD-10-CM | POA: Diagnosis not present

## 2020-12-31 DIAGNOSIS — D696 Thrombocytopenia, unspecified: Secondary | ICD-10-CM | POA: Diagnosis not present

## 2020-12-31 DIAGNOSIS — M47812 Spondylosis without myelopathy or radiculopathy, cervical region: Secondary | ICD-10-CM | POA: Diagnosis not present

## 2020-12-31 DIAGNOSIS — K5732 Diverticulitis of large intestine without perforation or abscess without bleeding: Secondary | ICD-10-CM | POA: Diagnosis not present

## 2020-12-31 MED ORDER — SULFAMETHOXAZOLE-TRIMETHOPRIM 800-160 MG PO TABS: 1.0000 | ORAL_TABLET | Freq: Two times a day (BID) | ORAL | 0 refills | Status: AC

## 2020-12-31 NOTE — Telephone Encounter (Signed)
Patients daughter calling states that patients physical therapist is out to there home this morning and have noticed that cellulitis is coming back on patients leg. Per daughter she is not able to bring her out for an appointment right now and would like to know if antibiotics could be sent in for this and they schedule an appointment for follow up if no improvement. Please advise.

## 2020-12-31 NOTE — Telephone Encounter (Signed)
Patient and daughter aware and will pick up

## 2021-01-05 DIAGNOSIS — M5416 Radiculopathy, lumbar region: Secondary | ICD-10-CM | POA: Diagnosis not present

## 2021-01-05 DIAGNOSIS — Z9181 History of falling: Secondary | ICD-10-CM | POA: Diagnosis not present

## 2021-01-05 DIAGNOSIS — L03116 Cellulitis of left lower limb: Secondary | ICD-10-CM | POA: Diagnosis not present

## 2021-01-05 DIAGNOSIS — Z7901 Long term (current) use of anticoagulants: Secondary | ICD-10-CM | POA: Diagnosis not present

## 2021-01-05 DIAGNOSIS — E785 Hyperlipidemia, unspecified: Secondary | ICD-10-CM | POA: Diagnosis not present

## 2021-01-05 DIAGNOSIS — E1151 Type 2 diabetes mellitus with diabetic peripheral angiopathy without gangrene: Secondary | ICD-10-CM | POA: Diagnosis not present

## 2021-01-05 DIAGNOSIS — T8149XA Infection following a procedure, other surgical site, initial encounter: Secondary | ICD-10-CM | POA: Diagnosis not present

## 2021-01-05 DIAGNOSIS — K5732 Diverticulitis of large intestine without perforation or abscess without bleeding: Secondary | ICD-10-CM | POA: Diagnosis not present

## 2021-01-05 DIAGNOSIS — Z79891 Long term (current) use of opiate analgesic: Secondary | ICD-10-CM | POA: Diagnosis not present

## 2021-01-05 DIAGNOSIS — I480 Paroxysmal atrial fibrillation: Secondary | ICD-10-CM | POA: Diagnosis not present

## 2021-01-05 DIAGNOSIS — I10 Essential (primary) hypertension: Secondary | ICD-10-CM | POA: Diagnosis not present

## 2021-01-05 DIAGNOSIS — D696 Thrombocytopenia, unspecified: Secondary | ICD-10-CM | POA: Diagnosis not present

## 2021-01-05 DIAGNOSIS — M47812 Spondylosis without myelopathy or radiculopathy, cervical region: Secondary | ICD-10-CM | POA: Diagnosis not present

## 2021-01-05 DIAGNOSIS — M10071 Idiopathic gout, right ankle and foot: Secondary | ICD-10-CM | POA: Diagnosis not present

## 2021-01-05 DIAGNOSIS — L02416 Cutaneous abscess of left lower limb: Secondary | ICD-10-CM | POA: Diagnosis not present

## 2021-01-05 DIAGNOSIS — B9689 Other specified bacterial agents as the cause of diseases classified elsewhere: Secondary | ICD-10-CM | POA: Diagnosis not present

## 2021-01-05 DIAGNOSIS — I714 Abdominal aortic aneurysm, without rupture, unspecified: Secondary | ICD-10-CM | POA: Diagnosis not present

## 2021-01-06 DIAGNOSIS — T8149XA Infection following a procedure, other surgical site, initial encounter: Secondary | ICD-10-CM | POA: Diagnosis not present

## 2021-01-06 DIAGNOSIS — I714 Abdominal aortic aneurysm, without rupture, unspecified: Secondary | ICD-10-CM | POA: Diagnosis not present

## 2021-01-06 DIAGNOSIS — I10 Essential (primary) hypertension: Secondary | ICD-10-CM | POA: Diagnosis not present

## 2021-01-06 DIAGNOSIS — D696 Thrombocytopenia, unspecified: Secondary | ICD-10-CM | POA: Diagnosis not present

## 2021-01-06 DIAGNOSIS — Z7901 Long term (current) use of anticoagulants: Secondary | ICD-10-CM | POA: Diagnosis not present

## 2021-01-06 DIAGNOSIS — L02416 Cutaneous abscess of left lower limb: Secondary | ICD-10-CM | POA: Diagnosis not present

## 2021-01-06 DIAGNOSIS — M10071 Idiopathic gout, right ankle and foot: Secondary | ICD-10-CM | POA: Diagnosis not present

## 2021-01-06 DIAGNOSIS — E785 Hyperlipidemia, unspecified: Secondary | ICD-10-CM | POA: Diagnosis not present

## 2021-01-06 DIAGNOSIS — K5732 Diverticulitis of large intestine without perforation or abscess without bleeding: Secondary | ICD-10-CM | POA: Diagnosis not present

## 2021-01-06 DIAGNOSIS — E1151 Type 2 diabetes mellitus with diabetic peripheral angiopathy without gangrene: Secondary | ICD-10-CM | POA: Diagnosis not present

## 2021-01-06 DIAGNOSIS — Z9181 History of falling: Secondary | ICD-10-CM | POA: Diagnosis not present

## 2021-01-06 DIAGNOSIS — B9689 Other specified bacterial agents as the cause of diseases classified elsewhere: Secondary | ICD-10-CM | POA: Diagnosis not present

## 2021-01-06 DIAGNOSIS — Z79891 Long term (current) use of opiate analgesic: Secondary | ICD-10-CM | POA: Diagnosis not present

## 2021-01-06 DIAGNOSIS — L03116 Cellulitis of left lower limb: Secondary | ICD-10-CM | POA: Diagnosis not present

## 2021-01-06 DIAGNOSIS — M47812 Spondylosis without myelopathy or radiculopathy, cervical region: Secondary | ICD-10-CM | POA: Diagnosis not present

## 2021-01-06 DIAGNOSIS — M5416 Radiculopathy, lumbar region: Secondary | ICD-10-CM | POA: Diagnosis not present

## 2021-01-06 DIAGNOSIS — I480 Paroxysmal atrial fibrillation: Secondary | ICD-10-CM | POA: Diagnosis not present

## 2021-01-07 DIAGNOSIS — D696 Thrombocytopenia, unspecified: Secondary | ICD-10-CM | POA: Diagnosis not present

## 2021-01-07 DIAGNOSIS — B9689 Other specified bacterial agents as the cause of diseases classified elsewhere: Secondary | ICD-10-CM | POA: Diagnosis not present

## 2021-01-07 DIAGNOSIS — M47812 Spondylosis without myelopathy or radiculopathy, cervical region: Secondary | ICD-10-CM | POA: Diagnosis not present

## 2021-01-07 DIAGNOSIS — T8149XA Infection following a procedure, other surgical site, initial encounter: Secondary | ICD-10-CM | POA: Diagnosis not present

## 2021-01-07 DIAGNOSIS — I10 Essential (primary) hypertension: Secondary | ICD-10-CM | POA: Diagnosis not present

## 2021-01-07 DIAGNOSIS — M5416 Radiculopathy, lumbar region: Secondary | ICD-10-CM | POA: Diagnosis not present

## 2021-01-07 DIAGNOSIS — L02416 Cutaneous abscess of left lower limb: Secondary | ICD-10-CM | POA: Diagnosis not present

## 2021-01-07 DIAGNOSIS — I480 Paroxysmal atrial fibrillation: Secondary | ICD-10-CM | POA: Diagnosis not present

## 2021-01-07 DIAGNOSIS — E1151 Type 2 diabetes mellitus with diabetic peripheral angiopathy without gangrene: Secondary | ICD-10-CM | POA: Diagnosis not present

## 2021-01-07 DIAGNOSIS — E785 Hyperlipidemia, unspecified: Secondary | ICD-10-CM | POA: Diagnosis not present

## 2021-01-07 DIAGNOSIS — Z7901 Long term (current) use of anticoagulants: Secondary | ICD-10-CM | POA: Diagnosis not present

## 2021-01-07 DIAGNOSIS — M10071 Idiopathic gout, right ankle and foot: Secondary | ICD-10-CM | POA: Diagnosis not present

## 2021-01-07 DIAGNOSIS — K5732 Diverticulitis of large intestine without perforation or abscess without bleeding: Secondary | ICD-10-CM | POA: Diagnosis not present

## 2021-01-07 DIAGNOSIS — L03116 Cellulitis of left lower limb: Secondary | ICD-10-CM | POA: Diagnosis not present

## 2021-01-07 DIAGNOSIS — I714 Abdominal aortic aneurysm, without rupture, unspecified: Secondary | ICD-10-CM | POA: Diagnosis not present

## 2021-01-07 DIAGNOSIS — Z9181 History of falling: Secondary | ICD-10-CM | POA: Diagnosis not present

## 2021-01-07 DIAGNOSIS — Z79891 Long term (current) use of opiate analgesic: Secondary | ICD-10-CM | POA: Diagnosis not present

## 2021-01-14 DIAGNOSIS — E785 Hyperlipidemia, unspecified: Secondary | ICD-10-CM | POA: Diagnosis not present

## 2021-01-14 DIAGNOSIS — Z79891 Long term (current) use of opiate analgesic: Secondary | ICD-10-CM | POA: Diagnosis not present

## 2021-01-14 DIAGNOSIS — M47812 Spondylosis without myelopathy or radiculopathy, cervical region: Secondary | ICD-10-CM | POA: Diagnosis not present

## 2021-01-14 DIAGNOSIS — D696 Thrombocytopenia, unspecified: Secondary | ICD-10-CM | POA: Diagnosis not present

## 2021-01-14 DIAGNOSIS — L02416 Cutaneous abscess of left lower limb: Secondary | ICD-10-CM | POA: Diagnosis not present

## 2021-01-14 DIAGNOSIS — Z9181 History of falling: Secondary | ICD-10-CM | POA: Diagnosis not present

## 2021-01-14 DIAGNOSIS — T8149XA Infection following a procedure, other surgical site, initial encounter: Secondary | ICD-10-CM | POA: Diagnosis not present

## 2021-01-14 DIAGNOSIS — Z7901 Long term (current) use of anticoagulants: Secondary | ICD-10-CM | POA: Diagnosis not present

## 2021-01-14 DIAGNOSIS — I480 Paroxysmal atrial fibrillation: Secondary | ICD-10-CM | POA: Diagnosis not present

## 2021-01-14 DIAGNOSIS — K5732 Diverticulitis of large intestine without perforation or abscess without bleeding: Secondary | ICD-10-CM | POA: Diagnosis not present

## 2021-01-14 DIAGNOSIS — M5416 Radiculopathy, lumbar region: Secondary | ICD-10-CM | POA: Diagnosis not present

## 2021-01-14 DIAGNOSIS — B9689 Other specified bacterial agents as the cause of diseases classified elsewhere: Secondary | ICD-10-CM | POA: Diagnosis not present

## 2021-01-14 DIAGNOSIS — E1151 Type 2 diabetes mellitus with diabetic peripheral angiopathy without gangrene: Secondary | ICD-10-CM | POA: Diagnosis not present

## 2021-01-14 DIAGNOSIS — M10071 Idiopathic gout, right ankle and foot: Secondary | ICD-10-CM | POA: Diagnosis not present

## 2021-01-14 DIAGNOSIS — I714 Abdominal aortic aneurysm, without rupture, unspecified: Secondary | ICD-10-CM | POA: Diagnosis not present

## 2021-01-14 DIAGNOSIS — L03116 Cellulitis of left lower limb: Secondary | ICD-10-CM | POA: Diagnosis not present

## 2021-01-14 DIAGNOSIS — I10 Essential (primary) hypertension: Secondary | ICD-10-CM | POA: Diagnosis not present

## 2021-01-15 DIAGNOSIS — E785 Hyperlipidemia, unspecified: Secondary | ICD-10-CM | POA: Diagnosis not present

## 2021-01-15 DIAGNOSIS — E1151 Type 2 diabetes mellitus with diabetic peripheral angiopathy without gangrene: Secondary | ICD-10-CM | POA: Diagnosis not present

## 2021-01-15 DIAGNOSIS — K5732 Diverticulitis of large intestine without perforation or abscess without bleeding: Secondary | ICD-10-CM | POA: Diagnosis not present

## 2021-01-15 DIAGNOSIS — I10 Essential (primary) hypertension: Secondary | ICD-10-CM | POA: Diagnosis not present

## 2021-01-15 DIAGNOSIS — T8149XA Infection following a procedure, other surgical site, initial encounter: Secondary | ICD-10-CM | POA: Diagnosis not present

## 2021-01-15 DIAGNOSIS — Z79891 Long term (current) use of opiate analgesic: Secondary | ICD-10-CM | POA: Diagnosis not present

## 2021-01-15 DIAGNOSIS — L03116 Cellulitis of left lower limb: Secondary | ICD-10-CM | POA: Diagnosis not present

## 2021-01-15 DIAGNOSIS — M10071 Idiopathic gout, right ankle and foot: Secondary | ICD-10-CM | POA: Diagnosis not present

## 2021-01-15 DIAGNOSIS — D696 Thrombocytopenia, unspecified: Secondary | ICD-10-CM | POA: Diagnosis not present

## 2021-01-15 DIAGNOSIS — Z9181 History of falling: Secondary | ICD-10-CM | POA: Diagnosis not present

## 2021-01-15 DIAGNOSIS — I480 Paroxysmal atrial fibrillation: Secondary | ICD-10-CM | POA: Diagnosis not present

## 2021-01-15 DIAGNOSIS — M5416 Radiculopathy, lumbar region: Secondary | ICD-10-CM | POA: Diagnosis not present

## 2021-01-15 DIAGNOSIS — I714 Abdominal aortic aneurysm, without rupture, unspecified: Secondary | ICD-10-CM | POA: Diagnosis not present

## 2021-01-15 DIAGNOSIS — M47812 Spondylosis without myelopathy or radiculopathy, cervical region: Secondary | ICD-10-CM | POA: Diagnosis not present

## 2021-01-15 DIAGNOSIS — L02416 Cutaneous abscess of left lower limb: Secondary | ICD-10-CM | POA: Diagnosis not present

## 2021-01-15 DIAGNOSIS — Z7901 Long term (current) use of anticoagulants: Secondary | ICD-10-CM | POA: Diagnosis not present

## 2021-01-15 DIAGNOSIS — B9689 Other specified bacterial agents as the cause of diseases classified elsewhere: Secondary | ICD-10-CM | POA: Diagnosis not present

## 2021-01-20 DIAGNOSIS — L02416 Cutaneous abscess of left lower limb: Secondary | ICD-10-CM | POA: Diagnosis not present

## 2021-01-20 DIAGNOSIS — D696 Thrombocytopenia, unspecified: Secondary | ICD-10-CM | POA: Diagnosis not present

## 2021-01-20 DIAGNOSIS — I480 Paroxysmal atrial fibrillation: Secondary | ICD-10-CM | POA: Diagnosis not present

## 2021-01-20 DIAGNOSIS — I10 Essential (primary) hypertension: Secondary | ICD-10-CM | POA: Diagnosis not present

## 2021-01-20 DIAGNOSIS — L03116 Cellulitis of left lower limb: Secondary | ICD-10-CM | POA: Diagnosis not present

## 2021-01-20 DIAGNOSIS — M10071 Idiopathic gout, right ankle and foot: Secondary | ICD-10-CM | POA: Diagnosis not present

## 2021-01-20 DIAGNOSIS — M5416 Radiculopathy, lumbar region: Secondary | ICD-10-CM | POA: Diagnosis not present

## 2021-01-20 DIAGNOSIS — T8149XA Infection following a procedure, other surgical site, initial encounter: Secondary | ICD-10-CM | POA: Diagnosis not present

## 2021-01-20 DIAGNOSIS — I714 Abdominal aortic aneurysm, without rupture, unspecified: Secondary | ICD-10-CM | POA: Diagnosis not present

## 2021-01-20 DIAGNOSIS — M47812 Spondylosis without myelopathy or radiculopathy, cervical region: Secondary | ICD-10-CM | POA: Diagnosis not present

## 2021-01-20 DIAGNOSIS — E1151 Type 2 diabetes mellitus with diabetic peripheral angiopathy without gangrene: Secondary | ICD-10-CM | POA: Diagnosis not present

## 2021-01-20 DIAGNOSIS — E785 Hyperlipidemia, unspecified: Secondary | ICD-10-CM | POA: Diagnosis not present

## 2021-01-20 DIAGNOSIS — B9689 Other specified bacterial agents as the cause of diseases classified elsewhere: Secondary | ICD-10-CM | POA: Diagnosis not present

## 2021-01-20 DIAGNOSIS — Z7901 Long term (current) use of anticoagulants: Secondary | ICD-10-CM | POA: Diagnosis not present

## 2021-01-20 DIAGNOSIS — Z79891 Long term (current) use of opiate analgesic: Secondary | ICD-10-CM | POA: Diagnosis not present

## 2021-01-20 DIAGNOSIS — K5732 Diverticulitis of large intestine without perforation or abscess without bleeding: Secondary | ICD-10-CM | POA: Diagnosis not present

## 2021-01-20 DIAGNOSIS — Z9181 History of falling: Secondary | ICD-10-CM | POA: Diagnosis not present

## 2021-01-22 DIAGNOSIS — M10071 Idiopathic gout, right ankle and foot: Secondary | ICD-10-CM | POA: Diagnosis not present

## 2021-01-22 DIAGNOSIS — D696 Thrombocytopenia, unspecified: Secondary | ICD-10-CM | POA: Diagnosis not present

## 2021-01-22 DIAGNOSIS — I480 Paroxysmal atrial fibrillation: Secondary | ICD-10-CM | POA: Diagnosis not present

## 2021-01-22 DIAGNOSIS — M47812 Spondylosis without myelopathy or radiculopathy, cervical region: Secondary | ICD-10-CM | POA: Diagnosis not present

## 2021-01-22 DIAGNOSIS — Z79891 Long term (current) use of opiate analgesic: Secondary | ICD-10-CM | POA: Diagnosis not present

## 2021-01-22 DIAGNOSIS — M5416 Radiculopathy, lumbar region: Secondary | ICD-10-CM | POA: Diagnosis not present

## 2021-01-22 DIAGNOSIS — L02416 Cutaneous abscess of left lower limb: Secondary | ICD-10-CM | POA: Diagnosis not present

## 2021-01-22 DIAGNOSIS — I10 Essential (primary) hypertension: Secondary | ICD-10-CM | POA: Diagnosis not present

## 2021-01-22 DIAGNOSIS — I714 Abdominal aortic aneurysm, without rupture, unspecified: Secondary | ICD-10-CM | POA: Diagnosis not present

## 2021-01-22 DIAGNOSIS — E785 Hyperlipidemia, unspecified: Secondary | ICD-10-CM | POA: Diagnosis not present

## 2021-01-22 DIAGNOSIS — Z9181 History of falling: Secondary | ICD-10-CM | POA: Diagnosis not present

## 2021-01-22 DIAGNOSIS — K5732 Diverticulitis of large intestine without perforation or abscess without bleeding: Secondary | ICD-10-CM | POA: Diagnosis not present

## 2021-01-22 DIAGNOSIS — Z7901 Long term (current) use of anticoagulants: Secondary | ICD-10-CM | POA: Diagnosis not present

## 2021-01-22 DIAGNOSIS — B9689 Other specified bacterial agents as the cause of diseases classified elsewhere: Secondary | ICD-10-CM | POA: Diagnosis not present

## 2021-01-22 DIAGNOSIS — E1151 Type 2 diabetes mellitus with diabetic peripheral angiopathy without gangrene: Secondary | ICD-10-CM | POA: Diagnosis not present

## 2021-01-22 DIAGNOSIS — T8149XA Infection following a procedure, other surgical site, initial encounter: Secondary | ICD-10-CM | POA: Diagnosis not present

## 2021-01-22 DIAGNOSIS — L03116 Cellulitis of left lower limb: Secondary | ICD-10-CM | POA: Diagnosis not present

## 2021-01-26 DIAGNOSIS — I714 Abdominal aortic aneurysm, without rupture, unspecified: Secondary | ICD-10-CM | POA: Diagnosis not present

## 2021-01-26 DIAGNOSIS — M5416 Radiculopathy, lumbar region: Secondary | ICD-10-CM | POA: Diagnosis not present

## 2021-01-26 DIAGNOSIS — Z9181 History of falling: Secondary | ICD-10-CM | POA: Diagnosis not present

## 2021-01-26 DIAGNOSIS — L03116 Cellulitis of left lower limb: Secondary | ICD-10-CM | POA: Diagnosis not present

## 2021-01-26 DIAGNOSIS — B9689 Other specified bacterial agents as the cause of diseases classified elsewhere: Secondary | ICD-10-CM | POA: Diagnosis not present

## 2021-01-26 DIAGNOSIS — E785 Hyperlipidemia, unspecified: Secondary | ICD-10-CM | POA: Diagnosis not present

## 2021-01-26 DIAGNOSIS — I10 Essential (primary) hypertension: Secondary | ICD-10-CM | POA: Diagnosis not present

## 2021-01-26 DIAGNOSIS — K5732 Diverticulitis of large intestine without perforation or abscess without bleeding: Secondary | ICD-10-CM | POA: Diagnosis not present

## 2021-01-26 DIAGNOSIS — L02416 Cutaneous abscess of left lower limb: Secondary | ICD-10-CM | POA: Diagnosis not present

## 2021-01-26 DIAGNOSIS — I480 Paroxysmal atrial fibrillation: Secondary | ICD-10-CM | POA: Diagnosis not present

## 2021-01-26 DIAGNOSIS — Z7901 Long term (current) use of anticoagulants: Secondary | ICD-10-CM | POA: Diagnosis not present

## 2021-01-26 DIAGNOSIS — D696 Thrombocytopenia, unspecified: Secondary | ICD-10-CM | POA: Diagnosis not present

## 2021-01-26 DIAGNOSIS — Z79891 Long term (current) use of opiate analgesic: Secondary | ICD-10-CM | POA: Diagnosis not present

## 2021-01-26 DIAGNOSIS — E1151 Type 2 diabetes mellitus with diabetic peripheral angiopathy without gangrene: Secondary | ICD-10-CM | POA: Diagnosis not present

## 2021-01-26 DIAGNOSIS — T8149XA Infection following a procedure, other surgical site, initial encounter: Secondary | ICD-10-CM | POA: Diagnosis not present

## 2021-01-26 DIAGNOSIS — M47812 Spondylosis without myelopathy or radiculopathy, cervical region: Secondary | ICD-10-CM | POA: Diagnosis not present

## 2021-01-26 DIAGNOSIS — M10071 Idiopathic gout, right ankle and foot: Secondary | ICD-10-CM | POA: Diagnosis not present

## 2021-01-30 ENCOUNTER — Other Ambulatory Visit: Payer: Self-pay

## 2021-01-30 ENCOUNTER — Telehealth: Payer: Self-pay

## 2021-01-30 NOTE — Progress Notes (Signed)
Chronic Care Management Pharmacy Assistant   Name: Vietta Bonifield  MRN: [id number] DOB: 28-Nov-1927  Reason for Encounter:Diabetes Disease State Call.   Recent office visits:  12/31/2020 Dr. Ethelene Hal MD (PCP) start  sulfamethoxazole-trimethoprim (BACTRIM DS) 800-160 MG tablet 2 times daily 12/18/2020 Dr. Ethelene Hal MD (PCP) start Jardiance 10 mg daily,discontinue glipizide 10 mg, Return in about 3 months  12/04/2020 Dr. Ethelene Hal MD (PCP) Start sulfamethoxazole-trimethoprim (BACTRIM DS) 800-160 MG tablet 2 times daily, start trazodone 50 mg prn, decrease metoprolol to 50 mg twice daily 11/24/2020 Dr. Ethelene Hal MD (PCP)No Medication changes noted  Recent consult visits:  12/17/2020 Leontine Locket PA-C (Vascular Surgery) No medication changes noted  Hospital visits:  Medication Reconciliation was completed by comparing discharge summary, patients EMR and Pharmacy list, and upon discussion with patient.  Admitted to the hospital on 11/24/2020 due to Cellulitis and abscess of left leg. Discharge date was 11/28/2020. Discharged from [place]?Medications Started at [facility] Discharge:?? -started None  Medication Changes at Hospital Discharge: -Changed None  Medications Discontinued at Hospital Discharge: -Stopped None  Medications that remain the same after Hospital Discharge:??  -All other medications will remain the same  Admitted to the hospital on 11/21/2020 due to Abscess. Discharge date was 11/21/2020. Discharged from [place]?Medications Started at [facility] Discharge:?? -started None  Medication Changes at Hospital Discharge: -Changed None  Medications Discontinued at Hospital Discharge: -Stopped None  Medications that remain the same after Hospital Discharge:??  -All other medications will remain the same  Admitted to the hospital on 11/17/2020 due to Cellulitis of left lower extremity Baker's cyst of knee. Discharge date was  11/17/2020. Discharged from [place]?Medications Started at [facility] Discharge:?? -started Cephalexin 500 mg 4 times daily  Medication Changes at Hospital Discharge: -Changed none  Medications Discontinued at Hospital Discharge: -Stopped None  Medications that remain the same after Hospital Discharge:??  -All other medications will remain the same.    Medications: Outpatient Encounter Medications as of 01/30/2021  Medication Sig Note   acetaminophen (TYLENOL) 650 MG CR tablet Take 650-1,300 mg by mouth every 8 (eight) hours as needed for pain.    amLODipine (NORVASC) 10 MG tablet TAKE 1 TABLET BY MOUTH EVERY DAY FOR BLOOD PRESSURE    atorvastatin (LIPITOR) 10 MG tablet TAKE 1 TABLET BY MOUTH EVERY DAY    diclofenac sodium (VOLTAREN) 1 % GEL Apply 2 g topically daily as needed (KNEE PAIN).    ELIQUIS 5 MG TABS tablet TAKE 1 TABLET BY MOUTH TWICE DAILY FOR 30 DAYS (02/13/20 TO INDEFINITELY) 11/25/2020: 1 dose PTA    empagliflozin (JARDIANCE) 10 MG TABS tablet Take 1 tablet (10 mg total) by mouth daily before breakfast.    losartan (COZAAR) 100 MG tablet TAKE 1 TABLET BY MOUTH EVERY DAY    metoprolol tartrate (LOPRESSOR) 50 MG tablet Take 1 tablet (50 mg total) by mouth 2 (two) times daily.    traMADol (ULTRAM) 50 MG tablet TAKE 1 TABLET BY MOUTH EVERY 12 HOURS ASNEEDED FOR PAIN    traZODone (DESYREL) 50 MG tablet 1/2 po q hs prn sleep    No facility-administered encounter medications on file as of 01/30/2021.    Care Gaps: Tetanus Vaccine Shingrix Vaccine DEXA Scan  Star Rating Drugs: Losartan 100 mg last filled 01/07/2021 90 day supply at [facility]. Atorvastatin 10 mg  last filled 12/04/2020 90 day supply at [facility]. Jardiance10 mg last filled  01/10/2021 90 day supply at [facility]. Medication Fill Gaps: None  Recent Relevant Labs: Lab Results  Component Value Date/Time   HGBA1C 5.7 (H) 11/24/2020 11:55 PM   HGBA1C 6.3 09/18/2020  02:29 PM   MICROALBUR 40.1 (H) 06/06/2020 10:21 AM   MICROALBUR 83.5 (H) 09/21/2019 10:51 AM    Kidney Function Lab Results  Component Value Date/Time   CREATININE 1.16 (H) 11/28/2020 02:01 AM   CREATININE 0.94 11/27/2020 02:50 AM   GFR 32.92 (L) 09/18/2020 02:29 PM   GFRNONAA 45 (L) 11/28/2020 02:01 AM   GFRAA 51 (L) 04/01/2018 06:54 PM    Current antihyperglycemic regimen:  Jardiance 10 mg 1 tablet daily  What recent interventions/DTPs have been made to improve glycemic control:  12/18/2020 Dr. Ethelene Hal MD (PCP) start Jardiance 10 mg daily,discontinue glipizide 10 mg. Patient daughter states since the change of stopping glipizide and starting Jardiance the patient has been doing much better, and her blood sugars has not been dropping low.  Have there been any recent hospitalizations or ED visits since last visit with CPP? Yes Patient denies hypoglycemic symptoms, including Pale, Sweaty, Shaky, Hungry, Nervous/irritable, and Vision changes Patient denies hyperglycemic symptoms, including blurry vision, excessive thirst, fatigue, polyuria, and weakness How often are you checking your blood sugar? once daily What are your blood sugars ranging?  Fasting:  Patient daughter states her blood sugars have been ranging around 125. Before meals:  None After meals:  None Bedtime:  None During the week, how often does your blood glucose drop below 70? Never Are you checking your feet daily/regularly?   Patient daughter denies numbness,pain or tingling sensation in her feet.  Patient daughter denies any questions/concerns for the clinical pharmacist at this time.  Adherence Review: Is the patient currently on a STATIN medication? Yes Is the patient currently on ACE/ARB medication? Yes Does the patient have >5 day gap between last estimated fill dates? No  Telephone follow up appointment with Care management team member scheduled for : 04/27/2021 at 11:00 am.  [place] Pharmacist Assistant [phone]

## 2021-02-19 ENCOUNTER — Telehealth: Payer: Self-pay

## 2021-02-19 NOTE — Progress Notes (Signed)
Per Clinical pharmacist, please reschedule patient telephone appointment on 04/27/2021.  Patient daughter agreed to reschedule appointment.  Telephone follow up appointment with Care management team member was rescheduled to : 04/30/2021 at 11:45 am.  [place] Pharmacist Assistant [phone]

## 2021-03-23 ENCOUNTER — Encounter: Payer: Self-pay | Admitting: Family Medicine

## 2021-03-23 ENCOUNTER — Other Ambulatory Visit: Payer: Self-pay

## 2021-03-23 ENCOUNTER — Ambulatory Visit (INDEPENDENT_AMBULATORY_CARE_PROVIDER_SITE_OTHER): Payer: Medicare Other | Admitting: Family Medicine

## 2021-03-23 VITALS — BP 120/82 | HR 67 | Temp 96.4°F | Ht 62.0 in | Wt 145.0 lb

## 2021-03-23 DIAGNOSIS — E1169 Type 2 diabetes mellitus with other specified complication: Secondary | ICD-10-CM | POA: Diagnosis not present

## 2021-03-23 DIAGNOSIS — E785 Hyperlipidemia, unspecified: Secondary | ICD-10-CM

## 2021-03-23 DIAGNOSIS — M25562 Pain in left knee: Secondary | ICD-10-CM | POA: Diagnosis not present

## 2021-03-23 DIAGNOSIS — G8929 Other chronic pain: Secondary | ICD-10-CM | POA: Diagnosis not present

## 2021-03-23 DIAGNOSIS — E119 Type 2 diabetes mellitus without complications: Secondary | ICD-10-CM

## 2021-03-23 DIAGNOSIS — M1712 Unilateral primary osteoarthritis, left knee: Secondary | ICD-10-CM

## 2021-03-23 DIAGNOSIS — I10 Essential (primary) hypertension: Secondary | ICD-10-CM

## 2021-03-23 DIAGNOSIS — N1832 Chronic kidney disease, stage 3b: Secondary | ICD-10-CM | POA: Diagnosis not present

## 2021-03-23 DIAGNOSIS — M25561 Pain in right knee: Secondary | ICD-10-CM | POA: Diagnosis not present

## 2021-03-23 LAB — BASIC METABOLIC PANEL
BUN: 21 mg/dL (ref 6–23)
CO2: 27 mEq/L (ref 19–32)
Calcium: 10.9 mg/dL — ABNORMAL HIGH (ref 8.4–10.5)
Chloride: 106 mEq/L (ref 96–112)
Creatinine, Ser: 1.13 mg/dL (ref 0.40–1.20)
GFR: 42.43 mL/min — ABNORMAL LOW (ref >60.00)
Glucose, Bld: 125 mg/dL — ABNORMAL HIGH (ref 70–99)
Potassium: 4 mEq/L (ref 3.5–5.1)
Sodium: 142 mEq/L (ref 135–145)

## 2021-03-23 LAB — LDL CHOLESTEROL, DIRECT: Direct LDL: 95 mg/dL

## 2021-03-23 LAB — CBC
HCT: 41.8 % (ref 36.0–46.0)
Hemoglobin: 12.7 g/dL (ref 12.0–15.0)
MCHC: 30.3 g/dL (ref 30.0–36.0)
MCV: 82.4 fl (ref 78.0–100.0)
Platelets: 92 10*3/uL — ABNORMAL LOW (ref 150.0–400.0)
RBC: 5.07 Mil/uL (ref 3.87–5.11)
RDW: 16.1 % — ABNORMAL HIGH (ref 11.5–15.5)
WBC: 4 10*3/uL (ref 4.0–10.5)

## 2021-03-23 LAB — HEMOGLOBIN A1C: Hgb A1c MFr Bld: 6.3 % (ref 4.6–6.5)

## 2021-03-23 NOTE — Progress Notes (Signed)
Established Patient Office Visit  Subjective:  Patient ID: Chelsea Dean, female    DOB: 12-Mar-1927  Age: 88 y.o. MRN: [id number]  CC:  Chief Complaint  Patient presents with   Follow-up    3 month follow up, patient states that knees are still very painful would like cortisone shot.     HPI Chelsea Dean presents for follow-up of hypertension, elevated cholesterol diabetes.  Blood pressure has been well controlled with the pain losartan and metoprolol.  Continues metoprolol for atrial A-fib along with Eliquis.  Cholesterol low-dose atorvastatin.  Status post discontinue Glucotrol was in.  He is tolerating this drug well.  Denies frequency frequency of urination.  Knees are bothering her.  History of severe osteoarthritis.  She is not a surgical candidate.  Requests knee injections today.  Left knee seems to be bothering her the most today.  Past Medical History:  Diagnosis Date   A-fib ([place]) 10/19/2016   Abdominal aortic aneurysm (AAA) without rupture 09/15/2013   Acute idiopathic gout of right foot    AKI (acute kidney injury) ([place]) 10/18/2016   Anticoagulated on Coumadin    Arthritis    Diabetes mellitus without complication (HCC)    Diverticular disease of large intestine 04/16/2013   Diverticulosis    DM2 (diabetes mellitus, type 2) (HCC) 10/19/2016   Dysrhythmia    Atrial fib   History of colon polyps 04/16/2013   Hx of colonic polyps    Hyperlipidemia    Hypertension    Long term current use of anticoagulant therapy 02/05/2015   Lumbar radiculopathy 03/28/2014   Microalbuminuria 04/16/2013   Osteoarthritis 04/16/2013   Osteoarthritis of cervical spine 03/28/2014   Pelvic mass 10/19/2016   Right renal mass    SBO (small bowel obstruction) ([place]) 10/18/2016   Thrombocytopenia ([place]) 04/16/2013   Overview:  referred to hematology 01/2104    Past Surgical History:  Procedure Laterality Date   ABDOMINAL AORTIC ANEURYSM REPAIR     ABDOMINAL HYSTERECTOMY      APPLICATION OF WOUND VAC Left 11/26/2020   Procedure: APPLICATION OF WOUND VAC;  Surgeon: Cherre Robins, MD;  Location: [facility];  Service: Vascular;  Laterality: Left;   INCISION AND DRAINAGE Left 11/26/2020   Procedure: INCISION AND DRAINAGE OF LEFT LEG;  Surgeon: Cherre Robins, MD;  Location[facility];  Service: Vascular;  Laterality: Left;   IR ANGIOGRAM EXTREMITY LEFT  04/01/2020   IR ANGIOGRAM PELVIS SELECTIVE OR SUPRASELECTIVE  04/01/2020   IR ANGIOGRAM SELECTIVE EACH ADDITIONAL VESSEL  04/01/2020   IR ANGIOGRAM SELECTIVE EACH ADDITIONAL VESSEL  04/01/2020   IR ANGIOGRAM SELECTIVE EACH ADDITIONAL VESSEL  04/01/2020   IR AORTAGRAM ABDOMINAL SERIALOGRAM  04/01/2020   IR EMBO ARTERIAL NOT HEMORR HEMANG INC GUIDE ROADMAPPING  04/01/2020   IR GENERIC HISTORICAL  07/01/2015   IR RADIOLOGIST EVAL & MGMT 07/01/2015 [facility] INTERV RAD   IR RADIOLOGIST EVAL & MGMT  03/11/2020   IR RADIOLOGIST EVAL & MGMT  04/30/2020   IR THROMBECT PRIM MECH INIT (INCLU) MOD SED  04/01/2020   IR US GUIDE VASC ACCESS LEFT  04/01/2020   IR US GUIDE VASC ACCESS LEFT  04/01/2020   IR US GUIDE VASC ACCESS RIGHT  04/01/2020   THROMBECTOMY FEMORAL ARTERY Left 04/01/2020   Procedure: THROMBECTOMY LEFT POPLITEAL ARTERY;  Surgeon: Rosetta Posner, MD;  Location: [facility] OR;  Service: Vascular;  Laterality: Left;    Family History  Problem Relation Age of Onset   Cancer Sister  Rectal and Stomach    Social History   Socioeconomic History   Marital status: Married    Spouse name: Not on file   Number of children: Not on file   Years of education: Not on file   Highest education level: Not on file  Occupational History   Occupation: retired [employer]  Tobacco Use   Smoking status: Never   Smokeless tobacco: Current    Types: Snuff  Vaping Use   Vaping Use: Never used  Substance and Sexual Activity   Alcohol use: No    Alcohol/week: 0.0 standard drinks   Drug use: No   Sexual activity: Not on file  Other Topics  Concern   Not on file  Social History Narrative   Admitted to [facility] 10/27/16   Married   Use snuff   Alcohol none   DNR   Social Determinants of Health   Financial Resource Strain: Low Risk    Difficulty of Paying Living Expenses: Not hard at all  Food Insecurity: No Food Insecurity   Worried About Charity fundraiser in the Last Year: Never true   [place] in the Last Year: Never true  Transportation Needs: No Transportation Needs   Lack of Transportation (Medical): No   Lack of Transportation (Non-Medical): No  Physical Activity: Insufficiently Active   Days of Exercise per Week: 3 days   Minutes of Exercise per Session: 10 min  Stress: No Stress Concern Present   Feeling of Stress : Not at all  Social Connections: Socially Isolated   Frequency of Communication with Friends and Family: Three times a week   Frequency of Social Gatherings with Friends and Family: Once a week   Attends Religious Services: Never   Marine scientist or Organizations: No   Attends Archivist Meetings: Never   Marital Status: Widowed  Human resources officer Violence: Not At Risk   Fear of Current or Ex-Partner: No   Emotionally Abused: No   Physically Abused: No   Sexually Abused: No    Outpatient Medications Prior to Visit  Medication Sig Dispense Refill   acetaminophen (TYLENOL) 650 MG CR tablet Take 650-1,300 mg by mouth every 8 (eight) hours as needed for pain.     amLODipine (NORVASC) 10 MG tablet TAKE 1 TABLET BY MOUTH EVERY DAY FOR BLOOD PRESSURE 90 tablet 1   atorvastatin (LIPITOR) 10 MG tablet TAKE 1 TABLET BY MOUTH EVERY DAY 90 tablet 3   diclofenac sodium (VOLTAREN) 1 % GEL Apply 2 g topically daily as needed (KNEE PAIN).     ELIQUIS 5 MG TABS tablet TAKE 1 TABLET BY MOUTH TWICE DAILY FOR 30 DAYS (02/13/20 TO INDEFINITELY) 60 tablet 5   empagliflozin (JARDIANCE) 10 MG TABS tablet Take 1 tablet (10 mg total) by mouth daily before breakfast. 30  tablet 3   losartan (COZAAR) 100 MG tablet TAKE 1 TABLET BY MOUTH EVERY DAY 90 tablet 1   metoprolol tartrate (LOPRESSOR) 50 MG tablet Take 1 tablet (50 mg total) by mouth 2 (two) times daily. 180 tablet 3   traMADol (ULTRAM) 50 MG tablet TAKE 1 TABLET BY MOUTH EVERY 12 HOURS ASNEEDED FOR PAIN 60 tablet 0   traZODone (DESYREL) 50 MG tablet 1/2 po q hs prn sleep 30 tablet 1   No facility-administered medications prior to visit.    Allergies  Allergen Reactions   Ace Inhibitors Shortness Of Breath and Rash   Contrast Media [Iodinated  Contrast Media] Shortness Of Breath and Rash   Nsaids     Unknown reaction    ROS Review of Systems  Constitutional:  Negative for chills, diaphoresis, fatigue, fever and unexpected weight change.  HENT: Negative.    Eyes:  Negative for photophobia and visual disturbance.  Respiratory: Negative.    Gastrointestinal: Negative.   Genitourinary:  Negative for difficulty urinating, frequency and urgency.  Musculoskeletal:  Positive for arthralgias and gait problem.  Neurological:  Negative for weakness.  Psychiatric/Behavioral: Negative.       Objective:    Physical Exam Vitals and nursing note reviewed.  Constitutional:      General: She is not in acute distress.    Appearance: Normal appearance. She is not ill-appearing, toxic-appearing or diaphoretic.  HENT:     Head: Normocephalic and atraumatic.     Right Ear: External ear normal.     Left Ear: External ear normal.  Eyes:     General: No scleral icterus.       Right eye: No discharge.        Left eye: No discharge.     Conjunctiva/sclera: Conjunctivae normal.  Cardiovascular:     Rate and Rhythm: Normal rate and regular rhythm.  Pulmonary:     Effort: Pulmonary effort is normal.     Breath sounds: Normal breath sounds.  Abdominal:     General: Bowel sounds are normal.  Musculoskeletal:     Cervical back: No rigidity or tenderness.     Left knee: Bony tenderness present. No swelling,  effusion or erythema. Tenderness present over the medial joint line.  Lymphadenopathy:     Cervical: No cervical adenopathy.  Skin:    General: Skin is warm and dry.  Neurological:     Mental Status: She is alert and oriented to person, place, and time.  Psychiatric:        Mood and Affect: Mood normal.        Behavior: Behavior normal.   Aspiration/Injection Procedure Note Chelsea Dean [id number] [birth year]  Procedure: Injection Indications: painful arthritis  Procedure Details Consent: Risks of procedure as well as the alternatives and risks of each were explained to the (patient/caregiver).  Consent for procedure obtained. Time Out: Verified patient identification, verified procedure, site/side was marked, verified correct patient position, special equipment/implants available, medications/allergies/relevent history reviewed, required imaging and test results available.  Performed   Local Anesthesia Used: none Amount of Fluid Aspirated:  none Character of Fluid:  na Fluid was sent for: na A sterile dressing was applied.  Patient did tolerate procedure well. Estimated blood loss: none Medial superior inferior patellar joint space of left knee identified and marked.  Cleansed with Betadine x3.  Injected with 80 mg of Kenalog and 1 cc of 2% lidocaine. [name] 03/23/2021, 10:34 AM  BP 120/82 (BP Location: Left Arm, Patient Position: Sitting, Cuff Size: Normal)    Pulse 67    Temp (!) 96.4 F (35.8 C) (Temporal)    Ht 5\' 2"  (1.575 m)    Wt 145 lb (65.8 kg)    SpO2 96%    BMI 26.52 kg/m  Wt Readings from Last 3 Encounters:  03/23/21 145 lb (65.8 kg)  12/18/20 154 lb 1.6 oz (69.9 kg)  12/17/20 165 lb (74.8 kg)     Health Maintenance Due  Topic Date Due   TETANUS/TDAP  Never done   Zoster Vaccines- Shingrix (1 of 2) Never done   DEXA SCAN  Never done   OPHTHALMOLOGY  EXAM  02/24/2021    There are no preventive care reminders to display for this  patient.  Lab Results  Component Value Date   TSH 3.26 03/02/2017   Lab Results  Component Value Date   WBC 6.5 12/04/2020   HGB 11.1 (L) 12/04/2020   HCT 35.5 (L) 12/04/2020   MCV 82.9 12/04/2020   PLT 180.0 12/04/2020   Lab Results  Component Value Date   NA 140 11/28/2020   K 3.8 11/28/2020   CO2 29 11/28/2020   GLUCOSE 102 (H) 11/28/2020   BUN 21 11/28/2020   CREATININE 1.16 (H) 11/28/2020   BILITOT 0.8 11/24/2020   ALKPHOS 41 11/24/2020   AST 19 11/24/2020   ALT 15 11/24/2020   PROT 6.8 11/24/2020   ALBUMIN 3.2 (L) 11/24/2020   CALCIUM 10.0 11/28/2020   ANIONGAP 8 11/28/2020   GFR 32.92 (L) 09/18/2020   Lab Results  Component Value Date   CHOL 134 06/06/2020   Lab Results  Component Value Date   HDL 33.10 (L) 06/06/2020   Lab Results  Component Value Date   LDLCALC 73 06/06/2020   Lab Results  Component Value Date   TRIG 135.0 06/06/2020   Lab Results  Component Value Date   CHOLHDL 4 06/06/2020   Lab Results  Component Value Date   HGBA1C 5.7 (H) 11/24/2020      Assessment & Plan:   Problem List Items Addressed This Visit       Cardiovascular and Mediastinum   Essential hypertension   Relevant Orders   Basic metabolic panel   CBC     Endocrine   Type 2 diabetes mellitus without complication, without long-term current use of insulin ([place]) - Primary   Relevant Orders   Basic metabolic panel   Hemoglobin A1c   Hyperlipidemia associated with type 2 diabetes mellitus ([place])   Relevant Orders   LDL cholesterol, direct     Musculoskeletal and Integument   Osteoarthritis     Genitourinary   Stage 3 chronic kidney disease ([place])   Relevant Orders   Basic metabolic panel     Other   Chronic pain of both knees    No orders of the defined types were placed in this encounter.   Follow-up: Return in about 3 months (around 06/20/2021), or if symptoms worsen or fail to improve and for regularly scheduled appointment.[name], MD

## 2021-03-25 ENCOUNTER — Telehealth: Payer: Self-pay | Admitting: Family Medicine

## 2021-03-25 DIAGNOSIS — Z7901 Long term (current) use of anticoagulants: Secondary | ICD-10-CM | POA: Diagnosis not present

## 2021-03-25 DIAGNOSIS — N200 Calculus of kidney: Secondary | ICD-10-CM | POA: Diagnosis not present

## 2021-03-25 DIAGNOSIS — K5730 Diverticulosis of large intestine without perforation or abscess without bleeding: Secondary | ICD-10-CM | POA: Diagnosis not present

## 2021-03-25 DIAGNOSIS — Z87442 Personal history of urinary calculi: Secondary | ICD-10-CM | POA: Diagnosis not present

## 2021-03-25 DIAGNOSIS — R319 Hematuria, unspecified: Secondary | ICD-10-CM | POA: Diagnosis not present

## 2021-03-25 DIAGNOSIS — N201 Calculus of ureter: Secondary | ICD-10-CM | POA: Diagnosis not present

## 2021-03-25 NOTE — Telephone Encounter (Signed)
Called to check on patient, per patients daughter patient has kidney stones they were at urgent care. Patient doing okay no complaints of pain.

## 2021-03-25 NOTE — Telephone Encounter (Signed)
Pts daughter called and said the past couple of times pt has went to the bathroom she has had blood in her urine and it has been a lot of blood she said so I transferred pt to the triage nurse for further evaluation.

## 2021-04-08 ENCOUNTER — Other Ambulatory Visit: Payer: Self-pay

## 2021-04-08 ENCOUNTER — Ambulatory Visit (INDEPENDENT_AMBULATORY_CARE_PROVIDER_SITE_OTHER): Payer: Medicare Other | Admitting: Family Medicine

## 2021-04-08 ENCOUNTER — Encounter: Payer: Self-pay | Admitting: Family Medicine

## 2021-04-08 VITALS — BP 108/66 | HR 79 | Temp 96.9°F | Ht 62.0 in | Wt 145.0 lb

## 2021-04-08 DIAGNOSIS — D696 Thrombocytopenia, unspecified: Secondary | ICD-10-CM | POA: Diagnosis not present

## 2021-04-08 DIAGNOSIS — L03116 Cellulitis of left lower limb: Secondary | ICD-10-CM

## 2021-04-08 LAB — CBC
HCT: 38.7 % (ref 36.0–46.0)
Hemoglobin: 12 g/dL (ref 12.0–15.0)
MCHC: 30.9 g/dL (ref 30.0–36.0)
MCV: 81.6 fl (ref 78.0–100.0)
Platelets: 72 10*3/uL — ABNORMAL LOW (ref 150.0–400.0)
RBC: 4.74 Mil/uL (ref 3.87–5.11)
RDW: 16.2 % — ABNORMAL HIGH (ref 11.5–15.5)
WBC: 9.3 10*3/uL (ref 4.0–10.5)

## 2021-04-08 MED ORDER — CEPHALEXIN 500 MG PO CAPS: 500.0000 mg | ORAL_CAPSULE | Freq: Three times a day (TID) | ORAL | 0 refills | Status: AC

## 2021-04-09 ENCOUNTER — Other Ambulatory Visit: Payer: Self-pay | Admitting: Family Medicine

## 2021-04-09 DIAGNOSIS — I10 Essential (primary) hypertension: Secondary | ICD-10-CM

## 2021-04-09 DIAGNOSIS — N1831 Chronic kidney disease, stage 3a: Secondary | ICD-10-CM

## 2021-04-09 DIAGNOSIS — E119 Type 2 diabetes mellitus without complications: Secondary | ICD-10-CM

## 2021-04-09 DIAGNOSIS — N183 Chronic kidney disease, stage 3 unspecified: Secondary | ICD-10-CM

## 2021-04-09 NOTE — Progress Notes (Deleted)
Established Patient Office Visit  Subjective:  Patient ID: Chelsea Dean, female    DOB: 1927-06-10  Age: 88 y.o. MRN: [id number]  CC:  Chief Complaint  Patient presents with   Cellulitis    Possible cellulitis in left leg.     HPI Chelsea Dean presents for evaluation of a 1 day history of swelling, erythema and discomfort of her left lower leg.  There is no injury.  Status post hour and 45-minute car ride yesterday to go see her brother who is in hospice.  She is on Eliquis 5 mg twice daily.  No fever.  Her visit in the trip upset her.  She did not sleep well last night and did not seem to recognize her daughter, Chelsea Dean this morning.  Past Medical History:  Diagnosis Date   A-fib ([place]) 10/19/2016   Abdominal aortic aneurysm (AAA) without rupture 09/15/2013   Acute idiopathic gout of right foot    AKI (acute kidney injury) ([place]) 10/18/2016   Anticoagulated on Coumadin    Arthritis    Diabetes mellitus without complication (HCC)    Diverticular disease of large intestine 04/16/2013   Diverticulosis    DM2 (diabetes mellitus, type 2) (HCC) 10/19/2016   Dysrhythmia    Atrial fib   History of colon polyps 04/16/2013   Hx of colonic polyps    Hyperlipidemia    Hypertension    Long term current use of anticoagulant therapy 02/05/2015   Lumbar radiculopathy 03/28/2014   Microalbuminuria 04/16/2013   Osteoarthritis 04/16/2013   Osteoarthritis of cervical spine 03/28/2014   Pelvic mass 10/19/2016   Right renal mass    SBO (small bowel obstruction) ([place]) 10/18/2016   Thrombocytopenia ([place]) 04/16/2013   Overview:  referred to hematology 01/2104    Past Surgical History:  Procedure Laterality Date   ABDOMINAL AORTIC ANEURYSM REPAIR     ABDOMINAL HYSTERECTOMY     APPLICATION OF WOUND VAC Left 11/26/2020   Procedure: APPLICATION OF WOUND VAC;  Surgeon: Cherre Robins, MD;  Location: [facility];  Service: Vascular;  Laterality: Left;   INCISION AND DRAINAGE Left 11/26/2020    Procedure: INCISION AND DRAINAGE OF LEFT LEG;  Surgeon: Cherre Robins, MD;  [facility];  Service: Vascular;  Laterality: Left;   IR ANGIOGRAM EXTREMITY LEFT  04/01/2020   IR ANGIOGRAM PELVIS SELECTIVE OR SUPRASELECTIVE  04/01/2020   IR ANGIOGRAM SELECTIVE EACH ADDITIONAL VESSEL  04/01/2020   IR ANGIOGRAM SELECTIVE EACH ADDITIONAL VESSEL  04/01/2020   IR ANGIOGRAM SELECTIVE EACH ADDITIONAL VESSEL  04/01/2020   IR AORTAGRAM ABDOMINAL SERIALOGRAM  04/01/2020   IR EMBO ARTERIAL NOT HEMORR HEMANG INC GUIDE ROADMAPPING  04/01/2020   IR GENERIC HISTORICAL  07/01/2015   IR RADIOLOGIST EVAL & MGMT 07/01/2015 [facility] INTERV RAD   IR RADIOLOGIST EVAL & MGMT  03/11/2020   IR RADIOLOGIST EVAL & MGMT  04/30/2020   IR THROMBECT PRIM MECH INIT (INCLU) MOD SED  04/01/2020   IR US GUIDE VASC ACCESS LEFT  04/01/2020   IR US GUIDE VASC ACCESS LEFT  04/01/2020   IR US GUIDE VASC ACCESS RIGHT  04/01/2020   THROMBECTOMY FEMORAL ARTERY Left 04/01/2020   Procedure: THROMBECTOMY LEFT POPLITEAL ARTERY;  Surgeon: Rosetta Posner, MD;  Location: [facility] OR;  Service: Vascular;  Laterality: Left;    Family History  Problem Relation Age of Onset   Cancer Sister        Rectal and Stomach    Social History   Socioeconomic History  Marital status: Married    Spouse name: Not on file   Number of children: Not on file   Years of education: Not on file   Highest education level: Not on file  Occupational History   Occupation: retired [employer]  Tobacco Use   Smoking status: Never   Smokeless tobacco: Current    Types: Snuff  Vaping Use   Vaping Use: Never used  Substance and Sexual Activity   Alcohol use: No    Alcohol/week: 0.0 standard drinks   Drug use: No   Sexual activity: Not on file  Other Topics Concern   Not on file  Social History Narrative   Admitted to [place] [facility] 10/27/16   Married   Use snuff   Alcohol none   DNR   Social Determinants of Health   Financial Resource Strain: Low  Risk    Difficulty of Paying Living Expenses: Not hard at all  Food Insecurity: No Food Insecurity   Worried About Charity fundraiser in the Last Year: Never true   [place] in the Last Year: Never true  Transportation Needs: No Transportation Needs   Lack of Transportation (Medical): No   Lack of Transportation (Non-Medical): No  Physical Activity: Insufficiently Active   Days of Exercise per Week: 3 days   Minutes of Exercise per Session: 10 min  Stress: No Stress Concern Present   Feeling of Stress : Not at all  Social Connections: Socially Isolated   Frequency of Communication with Friends and Family: Three times a week   Frequency of Social Gatherings with Friends and Family: Once a week   Attends Religious Services: Never   Marine scientist or Organizations: No   Attends Archivist Meetings: Never   Marital Status: Widowed  Human resources officer Violence: Not At Risk   Fear of Current or Ex-Partner: No   Emotionally Abused: No   Physically Abused: No   Sexually Abused: No    Outpatient Medications Prior to Visit  Medication Sig Dispense Refill   acetaminophen (TYLENOL) 650 MG CR tablet Take 650-1,300 mg by mouth every 8 (eight) hours as needed for pain.     atorvastatin (LIPITOR) 10 MG tablet TAKE 1 TABLET BY MOUTH EVERY DAY 90 tablet 3   diclofenac sodium (VOLTAREN) 1 % GEL Apply 2 g topically daily as needed (KNEE PAIN).     metoprolol tartrate (LOPRESSOR) 50 MG tablet Take 1 tablet (50 mg total) by mouth 2 (two) times daily. 180 tablet 3   traMADol (ULTRAM) 50 MG tablet TAKE 1 TABLET BY MOUTH EVERY 12 HOURS ASNEEDED FOR PAIN 60 tablet 0   traZODone (DESYREL) 50 MG tablet 1/2 po q hs prn sleep 30 tablet 1   amLODipine (NORVASC) 10 MG tablet TAKE 1 TABLET BY MOUTH EVERY DAY FOR BLOOD PRESSURE 90 tablet 1   ELIQUIS 5 MG TABS tablet TAKE 1 TABLET BY MOUTH TWICE DAILY FOR 30 DAYS (02/13/20 TO INDEFINITELY) 60 tablet 5   empagliflozin (JARDIANCE) 10 MG TABS  tablet Take 1 tablet (10 mg total) by mouth daily before breakfast. 30 tablet 3   losartan (COZAAR) 100 MG tablet TAKE 1 TABLET BY MOUTH EVERY DAY 90 tablet 1   No facility-administered medications prior to visit.    Allergies  Allergen Reactions   Ace Inhibitors Shortness Of Breath and Rash   Contrast Media [Iodinated Contrast Media] Shortness Of Breath and Rash   Nsaids  Unknown reaction    ROS Review of Systems  Constitutional:  Negative for fatigue, fever and unexpected weight change.  Respiratory: Negative.    Cardiovascular: Negative.   Gastrointestinal: Negative.   Skin:  Positive for color change and rash. Negative for wound.     Objective:    Physical Exam Vitals and nursing note reviewed.  Constitutional:      General: She is not in acute distress.    Appearance: Normal appearance. She is not ill-appearing, toxic-appearing or diaphoretic.  Pulmonary:     Effort: Pulmonary effort is normal.  Skin:    General: Skin is warm and dry.       Neurological:     Mental Status: She is alert and oriented to person, place, and time.  Psychiatric:        Mood and Affect: Mood normal.        Behavior: Behavior normal.    BP 108/66 (BP Location: Right Arm, Patient Position: Sitting, Cuff Size: Normal)    Pulse 79    Temp (!) 96.9 F (36.1 C) (Temporal)    Ht 5\' 2"  (1.575 m)    Wt 145 lb (65.8 kg)    SpO2 96%    BMI 26.52 kg/m  Wt Readings from Last 3 Encounters:  04/08/21 145 lb (65.8 kg)  03/23/21 145 lb (65.8 kg)  12/18/20 154 lb 1.6 oz (69.9 kg)     Health Maintenance Due  Topic Date Due   TETANUS/TDAP  Never done   Zoster Vaccines- Shingrix (1 of 2) Never done   DEXA SCAN  Never done   OPHTHALMOLOGY EXAM  02/24/2021    There are no preventive care reminders to display for this patient.  Lab Results  Component Value Date   TSH 3.26 03/02/2017   Lab Results  Component Value Date   WBC 9.3 04/08/2021   HGB 12.0 04/08/2021   HCT 38.7 04/08/2021    MCV 81.6 04/08/2021   PLT 72.0 (L) 04/08/2021   Lab Results  Component Value Date   NA 142 03/23/2021   K 4.0 03/23/2021   CO2 27 03/23/2021   GLUCOSE 125 (H) 03/23/2021   BUN 21 03/23/2021   CREATININE 1.13 03/23/2021   BILITOT 0.8 11/24/2020   ALKPHOS 41 11/24/2020   AST 19 11/24/2020   ALT 15 11/24/2020   PROT 6.8 11/24/2020   ALBUMIN 3.2 (L) 11/24/2020   CALCIUM 10.9 (H) 03/23/2021   ANIONGAP 8 11/28/2020   GFR 42.43 (L) 03/23/2021   Lab Results  Component Value Date   CHOL 134 06/06/2020   Lab Results  Component Value Date   HDL 33.10 (L) 06/06/2020   Lab Results  Component Value Date   LDLCALC 73 06/06/2020   Lab Results  Component Value Date   TRIG 135.0 06/06/2020   Lab Results  Component Value Date   CHOLHDL 4 06/06/2020   Lab Results  Component Value Date   HGBA1C 6.3 03/23/2021      Assessment & Plan:   Problem List Items Addressed This Visit       Hematopoietic and Hemostatic   Thrombocytopenia (HCC)     Other   Cellulitis of left leg - Primary   Relevant Medications   cephALEXin (KEFLEX) 500 MG capsule   Other Relevant Orders   CBC (Completed)   VAS [place] ABI WITH/WO TBI    Meds ordered this encounter  Medications   cephALEXin (KEFLEX) 500 MG capsule    Sig: Take 1 capsule (500  mg total) by mouth 3 (three) times daily for 10 days.    Dispense:  30 capsule    Refill:  0    Follow-up: Return in about 1 week (around 04/15/2021).    [name], MD

## 2021-04-10 ENCOUNTER — Telehealth: Payer: Self-pay

## 2021-04-10 NOTE — Telephone Encounter (Signed)
Per Dr. Torrie Mayers 80mg  injection given to patient on 03/23/21 left knee.

## 2021-04-10 NOTE — Telephone Encounter (Signed)
Patient seen in office received Kenalog shot needing dosage amount injection given in left knee correct?

## 2021-04-13 ENCOUNTER — Other Ambulatory Visit: Payer: Self-pay

## 2021-04-13 ENCOUNTER — Encounter: Payer: Self-pay | Admitting: Family Medicine

## 2021-04-13 ENCOUNTER — Other Ambulatory Visit: Payer: Self-pay | Admitting: Interventional Radiology

## 2021-04-13 ENCOUNTER — Ambulatory Visit (INDEPENDENT_AMBULATORY_CARE_PROVIDER_SITE_OTHER): Payer: Medicare Other | Admitting: Family Medicine

## 2021-04-13 VITALS — BP 115/74 | HR 73 | Temp 96.0°F | Ht 62.0 in | Wt 146.2 lb

## 2021-04-13 DIAGNOSIS — L03116 Cellulitis of left lower limb: Secondary | ICD-10-CM

## 2021-04-13 DIAGNOSIS — I9789 Other postprocedural complications and disorders of the circulatory system, not elsewhere classified: Secondary | ICD-10-CM

## 2021-04-13 NOTE — Progress Notes (Signed)
Established Patient Office Visit  Subjective:  Patient ID: Chelsea Dean, female    DOB: 1927-11-30  Age: 88 y.o. MRN: [id number]  CC:  Chief Complaint  Patient presents with   Follow-up    1 Week follow up on cellulitis     HPI Chelsea Dean presents for follow-up of cellulitis.  Improving.  Afebrile.  Past Medical History:  Diagnosis Date   A-fib ([place]) 10/19/2016   Abdominal aortic aneurysm (AAA) without rupture 09/15/2013   Acute idiopathic gout of right foot    AKI (acute kidney injury) ([place]) 10/18/2016   Anticoagulated on Coumadin    Arthritis    Diabetes mellitus without complication ([place])    Diverticular disease of large intestine 04/16/2013   Diverticulosis    DM2 (diabetes mellitus, type 2) ([place]) 10/19/2016   Dysrhythmia    Atrial fib   History of colon polyps 04/16/2013   Hx of colonic polyps    Hyperlipidemia    Hypertension    Long term current use of anticoagulant therapy 02/05/2015   Lumbar radiculopathy 03/28/2014   Microalbuminuria 04/16/2013   Osteoarthritis 04/16/2013   Osteoarthritis of cervical spine 03/28/2014   Pelvic mass 10/19/2016   Right renal mass    SBO (small bowel obstruction) ([place]) 10/18/2016   Thrombocytopenia ([place]) 04/16/2013   Overview:  referred to hematology 01/2104    Past Surgical History:  Procedure Laterality Date   ABDOMINAL AORTIC ANEURYSM REPAIR     ABDOMINAL HYSTERECTOMY     APPLICATION OF WOUND VAC Left 11/26/2020   Procedure: APPLICATION OF WOUND VAC;  Surgeon: Cherre Robins, MD;  Location: [facility] OR;  Service: Vascular;  Laterality: Left;   INCISION AND DRAINAGE Left 11/26/2020   Procedure: INCISION AND DRAINAGE OF LEFT LEG;  Surgeon: Cherre Robins, MD;  Location: [place];  Service: Vascular;  Laterality: Left;   IR ANGIOGRAM EXTREMITY LEFT  04/01/2020   IR ANGIOGRAM PELVIS SELECTIVE OR SUPRASELECTIVE  04/01/2020   IR ANGIOGRAM SELECTIVE EACH ADDITIONAL VESSEL  04/01/2020   IR ANGIOGRAM SELECTIVE EACH ADDITIONAL  VESSEL  04/01/2020   IR ANGIOGRAM SELECTIVE EACH ADDITIONAL VESSEL  04/01/2020   IR AORTAGRAM ABDOMINAL SERIALOGRAM  04/01/2020   IR EMBO ARTERIAL NOT HEMORR HEMANG INC GUIDE ROADMAPPING  04/01/2020   IR GENERIC HISTORICAL  07/01/2015   IR RADIOLOGIST EVAL & MGMT 07/01/2015 [facility] INTERV RAD   IR RADIOLOGIST EVAL & MGMT  03/11/2020   IR RADIOLOGIST EVAL & MGMT  04/30/2020   IR THROMBECT PRIM MECH INIT (INCLU) MOD SED  04/01/2020   IR US GUIDE VASC ACCESS LEFT  04/01/2020   IR US GUIDE VASC ACCESS LEFT  04/01/2020   IR US GUIDE VASC ACCESS RIGHT  04/01/2020   THROMBECTOMY FEMORAL ARTERY Left 04/01/2020   Procedure: THROMBECTOMY LEFT POPLITEAL ARTERY;  Surgeon: Rosetta Posner, MD;  Location: [facility] OR;  Service: Vascular;  Laterality: Left;    Family History  Problem Relation Age of Onset   Cancer Sister        Rectal and Stomach    Social History   Socioeconomic History   Marital status: Married    Spouse name: Not on file   Number of children: Not on file   Years of education: Not on file   Highest education level: Not on file  Occupational History   Occupation: retired [employer]  Tobacco Use   Smoking status: Never   Smokeless tobacco: Current    Types: Snuff  Vaping Use   Vaping Use:  Never used  Substance and Sexual Activity   Alcohol use: No    Alcohol/week: 0.0 standard drinks   Drug use: No   Sexual activity: Not on file  Other Topics Concern   Not on file  Social History Narrative   Admitted to [facility] 10/27/16   Married   Use snuff   Alcohol none   DNR   Social Determinants of Health   Financial Resource Strain: Low Risk    Difficulty of Paying Living Expenses: Not hard at all  Food Insecurity: No Food Insecurity   Worried About Charity fundraiser in the Last Year: Never true   [place] in the Last Year: Never true  Transportation Needs: No Transportation Needs   Lack of Transportation (Medical): No   Lack of Transportation (Non-Medical):  No  Physical Activity: Insufficiently Active   Days of Exercise per Week: 3 days   Minutes of Exercise per Session: 10 min  Stress: No Stress Concern Present   Feeling of Stress : Not at all  Social Connections: Socially Isolated   Frequency of Communication with Friends and Family: Three times a week   Frequency of Social Gatherings with Friends and Family: Once a week   Attends Religious Services: Never   Marine scientist or Organizations: No   Attends Archivist Meetings: Never   Marital Status: Widowed  Human resources officer Violence: Not At Risk   Fear of Current or Ex-Partner: No   Emotionally Abused: No   Physically Abused: No   Sexually Abused: No    Outpatient Medications Prior to Visit  Medication Sig Dispense Refill   acetaminophen (TYLENOL) 650 MG CR tablet Take 650-1,300 mg by mouth every 8 (eight) hours as needed for pain.     amLODipine (NORVASC) 10 MG tablet TAKE 1 TABLET BY MOUTH EVERY DAY FOR BLOOD PRESSURE 90 tablet 1   atorvastatin (LIPITOR) 10 MG tablet TAKE 1 TABLET BY MOUTH EVERY DAY 90 tablet 3   cephALEXin (KEFLEX) 500 MG capsule Take 1 capsule (500 mg total) by mouth 3 (three) times daily for 10 days. 30 capsule 0   diclofenac sodium (VOLTAREN) 1 % GEL Apply 2 g topically daily as needed (KNEE PAIN).     ELIQUIS 5 MG TABS tablet TAKE 1 TABLET BY MOUTH TWICE DAILY FOR 30 DAYS (02/13/20 TO INDEFINITELY) 60 tablet 5   JARDIANCE 10 MG TABS tablet TAKE 1 TABLET BY MOUTH EVERY MORNING BEFORE BREAKFAST 30 tablet 3   losartan (COZAAR) 100 MG tablet TAKE 1 TABLET BY MOUTH EVERY DAY 90 tablet 1   metoprolol tartrate (LOPRESSOR) 50 MG tablet Take 1 tablet (50 mg total) by mouth 2 (two) times daily. 180 tablet 3   traMADol (ULTRAM) 50 MG tablet TAKE 1 TABLET BY MOUTH EVERY 12 HOURS ASNEEDED FOR PAIN 60 tablet 0   traZODone (DESYREL) 50 MG tablet 1/2 po q hs prn sleep 30 tablet 1   No facility-administered medications prior to visit.    Allergies   Allergen Reactions   Ace Inhibitors Shortness Of Breath and Rash   Contrast Media [Iodinated Contrast Media] Shortness Of Breath and Rash   Nsaids     Unknown reaction    ROS Review of Systems  Constitutional: Negative.   Musculoskeletal:  Positive for gait problem.  Skin:  Negative for color change, rash and wound.  Neurological:  Negative for speech difficulty and numbness.  Psychiatric/Behavioral: Negative.  Objective:    Physical Exam Vitals and nursing note reviewed.  Constitutional:      Appearance: Normal appearance.  HENT:     Head: Normocephalic and atraumatic.  Skin:    General: Skin is warm and dry.       Neurological:     Mental Status: She is alert and oriented to person, place, and time.  Psychiatric:        Mood and Affect: Mood normal.        Behavior: Behavior normal.    BP 115/74 (BP Location: Right Arm, Patient Position: Sitting, Cuff Size: Small)    Pulse 73    Temp (!) 96 F (35.6 C) (Temporal)    Ht 5\' 2"  (1.575 m)    Wt 146 lb 3.2 oz (66.3 kg)    SpO2 96%    BMI 26.74 kg/m  Wt Readings from Last 3 Encounters:  04/13/21 146 lb 3.2 oz (66.3 kg)  04/08/21 145 lb (65.8 kg)  03/23/21 145 lb (65.8 kg)     Health Maintenance Due  Topic Date Due   TETANUS/TDAP  Never done   Zoster Vaccines- Shingrix (1 of 2) Never done   DEXA SCAN  Never done   OPHTHALMOLOGY EXAM  02/24/2021    There are no preventive care reminders to display for this patient.  Lab Results  Component Value Date   TSH 3.26 03/02/2017   Lab Results  Component Value Date   WBC 9.3 04/08/2021   HGB 12.0 04/08/2021   HCT 38.7 04/08/2021   MCV 81.6 04/08/2021   PLT 72.0 (L) 04/08/2021   Lab Results  Component Value Date   NA 142 03/23/2021   K 4.0 03/23/2021   CO2 27 03/23/2021   GLUCOSE 125 (H) 03/23/2021   BUN 21 03/23/2021   CREATININE 1.13 03/23/2021   BILITOT 0.8 11/24/2020   ALKPHOS 41 11/24/2020   AST 19 11/24/2020   ALT 15 11/24/2020   PROT 6.8  11/24/2020   ALBUMIN 3.2 (L) 11/24/2020   CALCIUM 10.9 (H) 03/23/2021   ANIONGAP 8 11/28/2020   GFR 42.43 (L) 03/23/2021   Lab Results  Component Value Date   CHOL 134 06/06/2020   Lab Results  Component Value Date   HDL 33.10 (L) 06/06/2020   Lab Results  Component Value Date   LDLCALC 73 06/06/2020   Lab Results  Component Value Date   TRIG 135.0 06/06/2020   Lab Results  Component Value Date   CHOLHDL 4 06/06/2020   Lab Results  Component Value Date   HGBA1C 6.3 03/23/2021      Assessment & Plan:   Problem List Items Addressed This Visit       Other   Cellulitis of left leg - Primary    No orders of the defined types were placed in this encounter.   Follow-up: No follow-ups on file.   Complete course of antibiotics.  Follow-up if not continuing to improve [name], MD

## 2021-04-14 ENCOUNTER — Other Ambulatory Visit: Payer: Self-pay

## 2021-04-14 ENCOUNTER — Other Ambulatory Visit: Payer: Self-pay | Admitting: Interventional Radiology

## 2021-04-14 DIAGNOSIS — I9789 Other postprocedural complications and disorders of the circulatory system, not elsewhere classified: Secondary | ICD-10-CM

## 2021-04-15 ENCOUNTER — Ambulatory Visit: Payer: Medicare Other | Admitting: Family Medicine

## 2021-04-20 NOTE — Addendum Note (Signed)
Addended by: Henrene Dodge on: 04/20/2021 12:58 PM   Modules accepted: Orders

## 2021-04-27 ENCOUNTER — Telehealth: Payer: Medicare Other

## 2021-04-29 ENCOUNTER — Telehealth: Payer: Self-pay

## 2021-04-29 NOTE — Progress Notes (Signed)
Chronic Care Management  APPOINTMENT REMINDER ? ?Chelsea Dean daughter was reminded to have all medications, supplements and any blood glucose and blood pressure readings available for review with [name], Pharm. D, at her telephone visit on 04/30/2021 at 11:45 pm. ? ? ?Patient daughter confirm appointment. ? ?Chelsea Dean ?Clinical Pharmacist Assistant ?[phone]  ? ?

## 2021-04-30 ENCOUNTER — Ambulatory Visit (INDEPENDENT_AMBULATORY_CARE_PROVIDER_SITE_OTHER): Payer: Medicare Other

## 2021-04-30 NOTE — Progress Notes (Signed)
? ?Chronic Care Management ?Pharmacy Note ? ?04/30/2021 ?Name:  Chelsea Dean MRN:  [id number] DOB:  [birth year] ? ?Summary: ?Patient presents for CCM follow-up. Today's visit was 100% conducted with Genene Churn, the patient's daughter and caregiver. She reports her mother has been doing well and has no complaints regarding her current medications ? ?Recommendations/Changes made from today's visit: ?Continue current medications ? ?Plan: ?CPP follow-up 6 months  ? ?Subjective: ?Chelsea Dean is an 88 y.o. year old female who is a primary patient of Ethelene Hal, Mortimer Fries, MD.  The CCM team was consulted for assistance with disease management and care coordination needs.   ? ?Engaged with patient by telephone for follow up visit in response to provider referral for pharmacy case management and/or care coordination services.  ? ?Consent to Services:  ?The patient was given information about Chronic Care Management services, agreed to services, and gave verbal consent prior to initiation of services.  Please see initial visit note for detailed documentation.  ? ?Patient Care Team: ?[name], MD as PCP - General (Family Medicine) ?Germaine Pomfret, [facility] as Pharmacist (Pharmacist) ? ?Recent office visits: ?04/13/21: Patient presented to Dr. Ethelene Hal for cellulitis.  ?04/08/21: Patient presented to Dr. Ethelene Hal for cellulitis. Cephalexin 500 mg TID  ?26/23: Patient presented to Dr. Ethelene Hal for follow-up.  ?09/18/20: Patient presented to Dr. Ethelene Hal for follow-up. eGFR 33 mL/min  ?06/06/20: Patient presented to Dr. Ethelene Hal for follow-up. Microalbuminuria  ?03/25/20: Patient presented to Dr. Gena Fray for follow-up. Diphenhydramine, PEG, prednisone, tramadol, warfarin stopped. Eliquis started.  ? ?Recent consult visits: ?05/05/20: Patient presented to Dr. Trula Slade (Vascular) for post-op follow-up. Oxycodone-APAP, prednisolone, prednisone stopped.  ? ?Hospital visits: ?03/25/21: Patient presented to ED for hematuria.   ? ?Objective: ? ?Lab Results  ?Component Value Date  ? CREATININE 1.13 03/23/2021  ? BUN 21 03/23/2021  ? GFR 42.43 (L) 03/23/2021  ? GFRNONAA 45 (L) 11/28/2020  ? GFRAA 51 (L) 04/01/2018  ? NA 142 03/23/2021  ? K 4.0 03/23/2021  ? CALCIUM 10.9 (H) 03/23/2021  ? CO2 27 03/23/2021  ? GLUCOSE 125 (H) 03/23/2021  ? ? ?Lab Results  ?Component Value Date/Time  ? HGBA1C 6.3 03/23/2021 10:38 AM  ? HGBA1C 5.7 (H) 11/24/2020 11:55 PM  ? GFR 42.43 (L) 03/23/2021 10:38 AM  ? GFR 32.92 (L) 09/18/2020 02:29 PM  ? MICROALBUR 40.1 (H) 06/06/2020 10:21 AM  ? MICROALBUR 83.5 (H) 09/21/2019 10:51 AM  ?  ?Last diabetic Eye exam: No results found for: HMDIABEYEEXA  ?Last diabetic Foot exam: No results found for: HMDIABFOOTEX  ? ?Lab Results  ?Component Value Date  ? CHOL 134 06/06/2020  ? HDL 33.10 (L) 06/06/2020  ? [place] 73 06/06/2020  ? LDLDIRECT 95.0 03/23/2021  ? TRIG 135.0 06/06/2020  ? CHOLHDL 4 06/06/2020  ? ? ?Hepatic Function Latest Ref Rng & Units 11/24/2020 11/24/2020 09/18/2020  ?Total Protein 6.5 - 8.1 g/dL 6.8 7.7 6.5  ?Albumin 3.5 - 5.0 g/dL 3.2(L) 3.6 4.0  ?AST 15 - 41 U/L 19 21 13   ?ALT 0 - 44 U/L 15 16 7   ?Alk Phosphatase 38 - 126 U/L 41 51 50  ?Total Bilirubin 0.3 - 1.2 mg/dL 0.8 0.6 0.6  ? ? ?Lab Results  ?Component Value Date/Time  ? TSH 3.26 03/02/2017 02:42 PM  ? ? ?CBC Latest Ref Rng & Units 04/08/2021 03/23/2021 12/04/2020  ?WBC 4.0 - 10.5 K/uL 9.3 4.0 6.5  ?Hemoglobin 12.0 - 15.0 g/dL 12.0 12.7 11.1(L)  ?Hematocrit 36.0 - 46.0 % 38.7 41.8  35.5(L)  ?Platelets 150.0 - 400.0 K/uL 72.0(L) 92.0 Repeated and verified X2.(L) 180.0  ? ? ?No results found for: VD25OH ? ?Clinical ASCVD: No  ?The ASCVD Risk score (Arnett DK, et al., 2019) failed to calculate for the following reasons: ?  The 2019 ASCVD risk score is only valid for ages 61 to 42   ? ?Depression screen [facility] 2/9 03/23/2021 11/24/2020 09/18/2020  ?Decreased Interest 0 0 0  ?Down, Depressed, Hopeless 0 0 0  ?PHQ - 2 Score 0 0 0  ?Altered sleeping - - -  ?Tired,  decreased energy - - -  ?Change in appetite - - -  ?Feeling bad or failure about yourself  - - -  ?Trouble concentrating - - -  ?Moving slowly or fidgety/restless - - -  ?Suicidal thoughts - - -  ?PHQ-9 Score - - -  ?  ? ?Social History  ? ?Tobacco Use  ?Smoking Status Never  ?Smokeless Tobacco Current  ? Types: Snuff  ? ?BP Readings from Last 3 Encounters:  ?04/13/21 115/74  ?04/08/21 108/66  ?03/23/21 120/82  ? ?Pulse Readings from Last 3 Encounters:  ?04/13/21 73  ?04/08/21 79  ?03/23/21 67  ? ?Wt Readings from Last 3 Encounters:  ?04/13/21 146 lb 3.2 oz (66.3 kg)  ?04/08/21 145 lb (65.8 kg)  ?03/23/21 145 lb (65.8 kg)  ? ?BMI Readings from Last 3 Encounters:  ?04/13/21 26.74 kg/m?  ?04/08/21 26.52 kg/m?  ?03/23/21 26.52 kg/m?  ? ? ?Assessment/Interventions: Review of patient past medical history, allergies, medications, health status, including review of consultants reports, laboratory and other test data, was performed as part of comprehensive evaluation and provision of chronic care management services.  ? ?SDOH:  (Social Determinants of Health) assessments and interventions performed: Yes ?SDOH Interventions   ? ?Flowsheet Row Most Recent Value  ?SDOH Interventions   ?Financial Strain Interventions Intervention Not Indicated  ? ?  ? ? ? ? ?[place] ? ?Allergies  ?Allergen Reactions  ? Ace Inhibitors Shortness Of Breath and Rash  ? Contrast Media [Iodinated Contrast Media] Shortness Of Breath and Rash  ? Nsaids   ?  Unknown reaction  ? ? ?Medications Reviewed Today   ? ? Reviewed by [name], MD (Physician) on 04/13/21 at 1345  Med List Status: <None>  ? ?Medication Order Taking? Sig Documenting Provider Last Dose Status Informant  ?acetaminophen (TYLENOL) 650 MG CR tablet [id number] Yes Take 650-1,300 mg by mouth every 8 (eight) hours as needed for pain. [provider] Taking Active Child  ?amLODipine (NORVASC) 10 MG tablet [id number] Yes TAKE 1 TABLET BY MOUTH EVERY DAY FOR BLOOD  PRESSURE [name], MD Taking Active   ?atorvastatin (LIPITOR) 10 MG tablet [id number] Yes TAKE 1 TABLET BY MOUTH EVERY DAY [name], MD Taking Active Child  ?cephALEXin (KEFLEX) 500 MG capsule [id number] Yes Take 1 capsule (500 mg total) by mouth 3 (three) times daily for 10 days. [name], MD Taking Active   ?diclofenac sodium (VOLTAREN) 1 % GEL [id number] Yes Apply 2 g topically daily as needed (KNEE PAIN). [provider] Taking Active Child  ?         ?Med Note Jilda Roche A   Wed Mar 26, 2020 11:11 AM)    ?ELIQUIS 5 MG TABS tablet [id number] Yes TAKE 1 TABLET BY MOUTH TWICE DAILY FOR 30 DAYS (02/13/20 TO INDEFINITELY) [name], MD Taking Active   ?JARDIANCE 10 MG TABS tablet [id number] Yes TAKE 1  TABLET BY MOUTH EVERY MORNING BEFORE BREAKFAST [name], MD Taking Active   ?losartan (COZAAR) 100 MG tablet [id number] Yes TAKE 1 TABLET BY MOUTH EVERY DAY [name], MD Taking Active   ?metoprolol tartrate (LOPRESSOR) 50 MG tablet [id number] Yes Take 1 tablet (50 mg total) by mouth 2 (two) times daily. [name], MD Taking Active   ?traMADol Veatrice Bourbon) 50 MG tablet [id number] Yes TAKE 1 TABLET BY MOUTH EVERY 12 HOURS ASNEEDED FOR PAIN [name], MD Taking Active Child  ?traZODone (DESYREL) 50 MG tablet [id number] Yes 1/2 po q hs prn sleep [name], MD Taking Active   ? ?  ?  ? ?  ? ? ?Patient Active Problem List  ? Diagnosis Date Noted  ? 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 Cellulitis of left leg 12/04/2020  ? Abscess of left leg 11/24/2020  ? Abscess 11/24/2020  ? Hyperlipidemia associated with type 2 diabetes mellitus ([place]) 06/06/2020  ? Ischemic leg 04/01/2020  ? BRBPR (bright red blood per rectum) 02/12/2020  ? Stage 4 chronic kidney disease ([place]) 09/21/2019  ? Elevated LDL cholesterol level 07/19/2018  ? Need for influenza vaccination 12/28/2017  ? Chronic pain of both knees  10/19/2017  ? Stage 3 chronic kidney disease ([place]) 07/25/2017  ? Hypomagnesemia 03/31/2017  ? 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/31/2017  ? Abnormal urinalysis 03/16/2017  ? Hypokalemia 10/31/2016  ? Lactic acidosis 10/31/2016  ? Atrial fibr

## 2021-04-30 NOTE — Patient Instructions (Signed)
Visit Information ?It was great speaking with you today!  Please let me know if you have any questions about our visit. ? ? Goals Addressed   ? ?  ?  ?  ?  ? This Visit's Progress  ?  Monitor and Manage My Blood Sugar-Diabetes Type 2   On track  ?  Timeframe:  Long-Range Goal ?Priority:  High ?Start Date:  05/06/2020                           ?Expected End Date:  11/06/2021                    ? ?Follow Up within 90 days ?  ?- check blood sugar at prescribed times ?- check blood sugar if I feel it is too high or too low ?- enter blood sugar readings and medication or insulin into daily log  ?  ?Why is this important?   ?Checking your blood sugar at home helps to keep it from getting very high or very low.  ?Writing the results in a diary or log helps the doctor know how to care for you.  ?Your blood sugar log should have the time, date and the results.  ?Also, write down the amount of insulin or other medicine that you take.  ?Other information, like what you ate, exercise done and how you were feeling, will also be helpful.   ?  ?Notes:  ?  ? ?  ? ? ?Patient Care Plan: General Pharmacy (Adult)  ?  ? ?Problem Identified: Hypertension, Hyperlipidemia, Diabetes, Atrial Fibrillation and Chronic Kidney Disease   ?Priority: High  ?  ? ?Long-Range Goal: Patient-Specific Goal   ?Start Date: 05/06/2020  ?Expected End Date: 11/04/2021  ?This Visit's Progress: On track  ?Recent Progress: On track  ?Priority: High  ?Note:   ?Current Barriers:  ?No barriers noted ? ?Pharmacist Clinical Goal(s):  ?Patient will maintain control of diabetes as evidenced by A1c less than 8%  through collaboration with PharmD and provider.  ? ?Interventions: ?1:1 collaboration with [name], MD regarding development and update of comprehensive plan of care as evidenced by provider attestation and co-signature ?Inter-disciplinary care team collaboration (see longitudinal plan of care) ?Comprehensive medication review performed; medication  list updated in electronic medical record ? ?Hypertension (BP goal <140/90) ?-Controlled ?-Current treatment: ?Amlodipine 10 mg daily: Appropriate, Effective, Safe, Accessible ?Losartan 100 mg daily: Appropriate, Effective, Safe, Accessible  ?Metoprolol Tartrate 50 mg twice daily: Appropriate, Effective, Safe, Accessible  ?-Medications previously tried: 135/60s   ?-Current home readings: 120-130s/70s,  ?-Denies hypotensive/hypertensive symptoms  ?-Educated on Daily salt intake goal < 2300 mg; ?Exercise goal of 150 minutes per week; ?Importance of home blood pressure monitoring; ?-Counseled to monitor BP at home weekly, document, and provide log at future appointments ?-Recommended to continue current medication ? ?Hyperlipidemia: (LDL goal < 100) ?-Controlled ?-Current treatment: ?Atorvastatin 10 mg daily  ?-Medications previously tried: NA  ?-Educated on Importance of limiting foods high in cholesterol; ?-Recommended to continue current medication ? ?Diabetes (A1c goal <7%) ?-Controlled ?-Current medications ?Jardiance 10 mg daily: Appropriate, Effective, Safe, Accessible  ?-Medications previously tried: NA  ?-Current home glucose readings ?fasting glucose: 108 ?post prandial glucose: NA ?-Denies hypoglycemic/hyperglycemic symptoms ?-Walking more frequently, appetite  ?-Recommended to continue current medication ? ?Atrial Fibrillation (Goal: prevent stroke and major bleeding) ?-Controlled ?-CHADSVASC: 6 ?-Current treatment: ?Rate control: Metoprolol Tartrate 100 mg twice daily  ?Anticoagulation: Eliquis 5 mg  twice daily  ?-Medications previously tried: NA ?-Counseled on bleeding risk associated with Eliquis and importance of self-monitoring for signs/symptoms of bleeding; ?avoidance of NSAIDs due to increased bleeding risk with anticoagulants; ?-Recommended to continue current medication ? ?Osteoarthritis (Goal: Minimize pain symptoms) ?-Controlled ?-Current treatment  ?Acetaminophen 650 CR two tablets in the  morning ?Tramadol 50 mg twice daily as needed  ?Voltaren 1% gel twice daily  ?-Medications previously tried: NA  ?-Walks with walker, is careful to avoid falls  ?-Recommended to continue current medication ? ?Chronic Kidney Injury ?-All medications assessed for renal dosing and appropriateness in chronic kidney disease. ?-Counseled on importance of adequate fluid intake, minimizing NSAID use.  ?-Recommended to continue current medication ? ?Patient Goals/Self-Care Activities ?Patient will:  ?- check glucose daily before breakfast, document, and provide at future appointments ?check blood pressure weekly, document, and provide at future appointments ? ?Follow Up Plan: Telephone follow up appointment with care management team member scheduled for:  10/29/2021 at 11:00 AM ?  ? ? ? ?Patient agreed to services and verbal consent obtained.  ? ?The patient verbalized understanding of instructions, educational materials, and care plan provided today and declined offer to receive copy of patient instructions, educational materials, and care plan.  ? ?[name], PharmD, BCACP, CPP ?Clinical Pharmacist Practitioner  ?[facility]  ?[phone]  ?

## 2021-05-01 ENCOUNTER — Ambulatory Visit (HOSPITAL_BASED_OUTPATIENT_CLINIC_OR_DEPARTMENT_OTHER)
Admission: RE | Admit: 2021-05-01 | Discharge: 2021-05-01 | Disposition: A | Discharge status: Home / Self Care | Payer: Medicare Other | Source: Ambulatory Visit | Attending: Family Medicine | Admitting: Family Medicine

## 2021-05-01 ENCOUNTER — Other Ambulatory Visit: Payer: Self-pay

## 2021-05-01 DIAGNOSIS — L03116 Cellulitis of left lower limb: Secondary | ICD-10-CM | POA: Diagnosis not present

## 2021-05-01 DIAGNOSIS — I739 Peripheral vascular disease, unspecified: Secondary | ICD-10-CM | POA: Diagnosis not present

## 2021-05-04 ENCOUNTER — Encounter: Payer: Self-pay | Admitting: Family Medicine

## 2021-05-04 DIAGNOSIS — Z87448 Personal history of other diseases of urinary system: Secondary | ICD-10-CM | POA: Diagnosis not present

## 2021-05-04 DIAGNOSIS — R319 Hematuria, unspecified: Secondary | ICD-10-CM | POA: Diagnosis not present

## 2021-05-04 DIAGNOSIS — N202 Calculus of kidney with calculus of ureter: Secondary | ICD-10-CM | POA: Diagnosis not present

## 2021-05-05 MED ORDER — TRIAMCINOLONE ACETONIDE 40 MG/ML IJ SUSP: 80.0000 mg | Freq: Once | INTRAMUSCULAR | Status: DC

## 2021-05-15 DIAGNOSIS — E119 Type 2 diabetes mellitus without complications: Secondary | ICD-10-CM | POA: Diagnosis not present

## 2021-05-15 DIAGNOSIS — I10 Essential (primary) hypertension: Secondary | ICD-10-CM | POA: Diagnosis not present

## 2021-06-05 ENCOUNTER — Other Ambulatory Visit: Payer: Self-pay | Admitting: Family Medicine

## 2021-06-05 DIAGNOSIS — E7800 Pure hypercholesterolemia, unspecified: Secondary | ICD-10-CM

## 2021-06-08 ENCOUNTER — Telehealth: Payer: Self-pay

## 2021-06-08 MED ORDER — DIPHENHYDRAMINE HCL 50 MG PO TABS: 50.0000 mg | ORAL_TABLET | Freq: Once | ORAL | 0 refills | Status: DC

## 2021-06-08 MED ORDER — PREDNISONE 50 MG PO TABS: ORAL_TABLET | ORAL | 0 refills | Status: DC

## 2021-06-08 NOTE — Telephone Encounter (Signed)
Phone call to patient to review instructions for 13 hr prep for CT w/ contrast on 06/16/21  at 10:45am. Prescription called into [facility]. Pt aware and verbalized understanding of instructions. Pt to take 50 mg of prednisone on 06/15/21 at 9:45pm, 50 mg of prednisone on 06/16/21 at 3:45am, and 50 mg of prednisone on 06/16/21 at 9:45am. Pt is also to take 50 mg of benadryl on 06/16/21 at 9:45am. Please call [phone] with any questions. ?

## 2021-06-16 DIAGNOSIS — I712 Thoracic aortic aneurysm, without rupture, unspecified: Secondary | ICD-10-CM | POA: Diagnosis not present

## 2021-06-16 DIAGNOSIS — I723 Aneurysm of iliac artery: Secondary | ICD-10-CM | POA: Diagnosis not present

## 2021-06-16 DIAGNOSIS — T82868A Thrombosis of vascular prosthetic devices, implants and grafts, initial encounter: Secondary | ICD-10-CM | POA: Diagnosis not present

## 2021-06-16 DIAGNOSIS — N200 Calculus of kidney: Secondary | ICD-10-CM | POA: Diagnosis not present

## 2021-06-16 DIAGNOSIS — I9789 Other postprocedural complications and disorders of the circulatory system, not elsewhere classified: Secondary | ICD-10-CM | POA: Diagnosis not present

## 2021-06-22 ENCOUNTER — Telehealth: Payer: Self-pay

## 2021-06-22 ENCOUNTER — Encounter: Payer: Self-pay | Admitting: *Deleted

## 2021-06-22 ENCOUNTER — Ambulatory Visit
Admission: RE | Admit: 2021-06-22 | Discharge: 2021-06-22 | Disposition: A | Discharge status: Home / Self Care | Payer: Medicare Other | Source: Ambulatory Visit | Attending: Interventional Radiology | Admitting: Interventional Radiology

## 2021-06-22 DIAGNOSIS — I714 Abdominal aortic aneurysm, without rupture, unspecified: Secondary | ICD-10-CM | POA: Diagnosis not present

## 2021-06-22 DIAGNOSIS — Z9889 Other specified postprocedural states: Secondary | ICD-10-CM | POA: Diagnosis not present

## 2021-06-22 DIAGNOSIS — I9789 Other postprocedural complications and disorders of the circulatory system, not elsewhere classified: Secondary | ICD-10-CM

## 2021-06-22 HISTORY — PX: IR RADIOLOGIST EVAL & MGMT: IMG5224

## 2021-06-22 NOTE — Progress Notes (Signed)
? ? ?Chief Complaint: ?Patient was consulted remotely today (TeleHealth) for Type 2 endoleak at the request of Aziah Brostrom K.   ? ?Referring Physician(s): ?Tekoa Hamor K ? ?History of Present Illness: ?Chelsea Dean is a 88 y.o. female with a history of abdominal aortic aneurysm previously treated with endovascular aortic repair (Dr. Janet Berlin in January 2012) using a bifurcated Endograft. 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 ?  ?Initial surveillance showed stability of the excluded aneurysm sac and no further intervention was performed. However, on the most recent CTA of the abdomen and pelvis performed 06/13/2015 the aneurysm sac measures 6.7 x 6.3 cm each is a significant enlargement compared to 5.4 cm in 2012. ?  ?She underwent endovascular repair of the type II endoleak on 08/14/2015 with coil embolization of the common trunk of the left and right L4 lumbar arteries. ?  ?CT arteriogram of the abdomen and pelvis performed 09/05/2017.  There was a persistent type II endoleak, however the infrarenal abdominal aortic aneurysm sac demonstrates no significant interval growth over the past year dating back to January.  However, compared to more remote prior studies from December 2017 there has been definitive growth over time.  At that time, she elected to pursue continued surveillance rather than intervention.  Follow-up was not performed for some time due to the Covid pandemic.  However, on 03/05/2020 she had a follow-up CT arteriogram.  Persistent type II endoleak with further enlargement of the Endosac to 9.0 cm.  Additionally, she has developed complete occlusion of the left stent graft limb and the majority of the left external iliac artery which is new compared to 2019.  Slight interval progression of in-stent stenosis within the right stent graft limb. ?  ?On 04/01/2020 she underwent an unsuccessful attempted recanalization  of the occluded iliac limb followed by successful completion embolization of the right L4 and left L4 lumbar arteries.  Unfortunately, during the procedure some chronic thrombus within the left external iliac artery was mobilized and embolized distally into the popliteal artery requiring open surgical embolectomy.  Fortunately, she has recovered very well from this complication.   ? ?CTA 06/16/21 -persistent slow type II endoleak on delayed phase imaging with continued growth of the aneurysm sac now measuring 9.5 x 8.2 cm compared to 9.0 x 7.8 cm in January 2022. ? ?I spoke with her and her daughter today over the telephone.  Currently, she is doing very well overall.  Her daughter feels she is doing "great for her age". ? ?Past Medical History:  ?Diagnosis Date  ? A-fib ([place]) 10/19/2016  ? Abdominal aortic aneurysm (AAA) without rupture 09/15/2013  ? Acute idiopathic gout of right foot   ? AKI (acute kidney injury) ([place]) 10/18/2016  ? Anticoagulated on Coumadin   ? Arthritis   ? Diabetes mellitus without complication ([place])   ? Diverticular disease of large intestine 04/16/2013  ? Diverticulosis   ? DM2 (diabetes mellitus, type 2) ([place]) 10/19/2016  ? Dysrhythmia   ? Atrial fib  ? History of colon polyps 04/16/2013  ? Hx of colonic polyps   ? Hyperlipidemia   ? Hypertension   ? Long term current use of anticoagulant therapy 02/05/2015  ? Lumbar radiculopathy 03/28/2014  ? Microalbuminuria 04/16/2013  ? Osteoarthritis 04/16/2013  ? Osteoarthritis of cervical spine 03/28/2014  ? Pelvic mass 10/19/2016  ? Right renal mass   ? SBO (small bowel obstruction) ([place]) 10/18/2016  ? Thrombocytopenia ([place]) 04/16/2013  ?  Overview:  referred to hematology 01/2104  ? ? ?Past Surgical History:  ?Procedure Laterality Date  ? ABDOMINAL AORTIC ANEURYSM REPAIR    ? ABDOMINAL HYSTERECTOMY    ? APPLICATION OF WOUND VAC Left 11/26/2020  ? Procedure: APPLICATION OF WOUND VAC;  Surgeon: Cherre Robins, MD;  Location: [facility];  Service:  Vascular;  Laterality: Left;  ? INCISION AND DRAINAGE Left 11/26/2020  ? Procedure: INCISION AND DRAINAGE OF LEFT LEG;  Surgeon: Cherre Robins, MD;  Location: [facility];  Service: Vascular;  Laterality: Left;  ? IR ANGIOGRAM EXTREMITY LEFT  04/01/2020  ? IR ANGIOGRAM PELVIS SELECTIVE OR SUPRASELECTIVE  04/01/2020  ? IR ANGIOGRAM SELECTIVE EACH ADDITIONAL VESSEL  04/01/2020  ? IR ANGIOGRAM SELECTIVE EACH ADDITIONAL VESSEL  04/01/2020  ? IR ANGIOGRAM SELECTIVE EACH ADDITIONAL VESSEL  04/01/2020  ? IR AORTAGRAM ABDOMINAL SERIALOGRAM  04/01/2020  ? IR EMBO ARTERIAL NOT HEMORR HEMANG INC GUIDE ROADMAPPING  04/01/2020  ? IR GENERIC HISTORICAL  07/01/2015  ? IR RADIOLOGIST EVAL & MGMT 07/01/2015 [facility] INTERV RAD  ? IR RADIOLOGIST EVAL & MGMT  03/11/2020  ? IR RADIOLOGIST EVAL & MGMT  04/30/2020  ? IR THROMBECT PRIM MECH INIT (INCLU) MOD SED  04/01/2020  ? IR US GUIDE VASC ACCESS LEFT  04/01/2020  ? IR US GUIDE VASC ACCESS LEFT  04/01/2020  ? IR US GUIDE VASC ACCESS RIGHT  04/01/2020  ? THROMBECTOMY FEMORAL ARTERY Left 04/01/2020  ? Procedure: THROMBECTOMY LEFT POPLITEAL ARTERY;  Surgeon: Rosetta Posner, MD;  Location: [facility];  Service: Vascular;  Laterality: Left;  ? ? ?Allergies: ?Ace inhibitors, Contrast media [iodinated contrast media], and Nsaids ? ?Medications: ?Prior to Admission medications   ?Medication Sig Start Date End Date Taking? Authorizing Provider  ?acetaminophen (TYLENOL) 650 MG CR tablet Take 650-1,300 mg by mouth every 8 (eight) hours as needed for pain.    [provider]  ?amLODipine (NORVASC) 10 MG tablet TAKE 1 TABLET BY MOUTH EVERY DAY FOR BLOOD PRESSURE 04/09/21   [name], MD  ?atorvastatin (LIPITOR) 10 MG tablet TAKE 1 TABLET BY MOUTH EVERY DAY 06/05/21   [name], MD  ?diclofenac sodium (VOLTAREN) 1 % GEL Apply 2 g topically daily as needed (KNEE PAIN).    [provider]  ?diphenhydrAMINE (BENADRYL) 50 MG tablet Take 1 tablet (50 mg total) by mouth once for 1 dose. Pt  is also to take 50 mg of benadryl on 06/16/21 at 9:45am. Please call [phone] with any questions. 06/08/21 06/08/21  Arne Cleveland, MD  ?Arne Cleveland 5 MG TABS tablet TAKE 1 TABLET BY MOUTH TWICE DAILY FOR 30 DAYS (02/13/20 TO INDEFINITELY) 04/09/21   [name], MD  ?JARDIANCE 10 MG TABS tablet TAKE 1 TABLET BY MOUTH EVERY MORNING BEFORE BREAKFAST 04/09/21   [name], MD  ?losartan (COZAAR) 100 MG tablet TAKE 1 TABLET BY MOUTH EVERY DAY 04/09/21   [name], MD  ?metoprolol tartrate (LOPRESSOR) 50 MG tablet Take 1 tablet (50 mg total) by mouth 2 (two) times daily. 12/04/20   [name], MD  ?predniSONE (DELTASONE) 50 MG tablet Pt to take 50 mg of prednisone on 06/15/21 at 9:45pm, 50 mg of prednisone on 06/16/21 at 3:45am, and 50 mg of prednisone on 06/16/21 at 9:45am. Pt is also to take 50 mg of benadryl on 06/16/21 at 9:45am. Please call [phone] with any questions. 06/08/21   Arne Cleveland, MD  ?traMADol (ULTRAM) 50 MG tablet TAKE 1 TABLET BY MOUTH  EVERY 12 HOURS ASNEEDED FOR PAIN 11/14/20   [name], MD  ?traZODone (DESYREL) 50 MG tablet 1/2 po q hs prn sleep 12/04/20   [name], MD  ?  ? ?Family History  ?Problem Relation Age of Onset  ? Cancer Sister   ?     Rectal and Stomach  ? ? ?Social History  ? ?Socioeconomic History  ? Marital status: Married  ?  Spouse name: Not on file  ? Number of children: Not on file  ? Years of education: Not on file  ? Highest education level: Not on file  ?Occupational History  ? Occupation: retired [employer]  ?Tobacco Use  ? Smoking status: Never  ? Smokeless tobacco: Current  ?  Types: Snuff  ?Vaping Use  ? Vaping Use: Never used  ?Substance and Sexual Activity  ? Alcohol use: No  ?  Alcohol/week: 0.0 standard drinks  ? Drug use: No  ? Sexual activity: Not on file  ?Other Topics Concern  ? Not on file  ?Social History Narrative  ? Admitted to [facility] 10/27/16  ? Married  ? Use snuff  ?  Alcohol none  ? DNR  ? ?Social Determinants of Health  ? ?Financial Resource Strain: Low Risk   ? Difficulty of Paying Living Expenses: Not hard at all  ?Food Insecurity: No Food Insecurity  ? Worried About [employer]

## 2021-06-22 NOTE — Progress Notes (Signed)
? ? ?  Chronic Care Management ?Pharmacy Assistant  ? ?Name: Chelsea Dean  MRN: [id number] DOB: 23-Aug-1927 ? ?Reason for Encounter: Medication Review/General Adherence Call. ?  ?Recent office visits:  ?No recent office visit ? ?Recent consult visits:  ?05/04/2021 Dr. Venia Minks MD (Urology) No Medication Changes noted, Return in 3 months ? ?Hospital visits:  ?None in previous 6 months ? ?Medications: ?Outpatient Encounter Medications as of 06/22/2021  ?Medication Sig  ? acetaminophen (TYLENOL) 650 MG CR tablet Take 650-1,300 mg by mouth every 8 (eight) hours as needed for pain.  ? amLODipine (NORVASC) 10 MG tablet TAKE 1 TABLET BY MOUTH EVERY DAY FOR BLOOD PRESSURE  ? atorvastatin (LIPITOR) 10 MG tablet TAKE 1 TABLET BY MOUTH EVERY DAY  ? diclofenac sodium (VOLTAREN) 1 % GEL Apply 2 g topically daily as needed (KNEE PAIN).  ? diphenhydrAMINE (BENADRYL) 50 MG tablet Take 1 tablet (50 mg total) by mouth once for 1 dose. Pt is also to take 50 mg of benadryl on 06/16/21 at 9:45am. Please call [phone] with any questions.  ? ELIQUIS 5 MG TABS tablet TAKE 1 TABLET BY MOUTH TWICE DAILY FOR 30 DAYS (02/13/20 TO INDEFINITELY)  ? JARDIANCE 10 MG TABS tablet TAKE 1 TABLET BY MOUTH EVERY MORNING BEFORE BREAKFAST  ? losartan (COZAAR) 100 MG tablet TAKE 1 TABLET BY MOUTH EVERY DAY  ? metoprolol tartrate (LOPRESSOR) 50 MG tablet Take 1 tablet (50 mg total) by mouth 2 (two) times daily.  ? predniSONE (DELTASONE) 50 MG tablet Pt to take 50 mg of prednisone on 06/15/21 at 9:45pm, 50 mg of prednisone on 06/16/21 at 3:45am, and 50 mg of prednisone on 06/16/21 at 9:45am. Pt is also to take 50 mg of benadryl on 06/16/21 at 9:45am. Please call [phone] with any questions.  ? traMADol (ULTRAM) 50 MG tablet TAKE 1 TABLET BY MOUTH EVERY 12 HOURS ASNEEDED FOR PAIN  ? traZODone (DESYREL) 50 MG tablet 1/2 po q hs prn sleep  ? ?Facility-Administered Encounter Medications as of 06/22/2021  ?Medication  ? triamcinolone acetonide (KENALOG-40) injection 80 mg   ? ? ?Care Gaps: ?Tetanus Vaccine ?Shingrix Vaccine ?DEXA Scan ?Ophthalmology Exam ?Foot Exam ?  ?Star Rating Drugs: ?Losartan 100 mg last filled 04/09/2021 90 day supply at [facility]. ?Atorvastatin 10 mg  last filled 06/05/2021 90 day supply at [facility]. ?Jardiance10 mg last filled 06/05/2021 90 day supply at [facility]. ? ?Medication Fill Gaps: ?None ? ?Called patient and discussed medication adherence  with patient, no issues at this time with current medication. ? ? Patient daughter Denies ED visit since her last CPP follow up.  ?Patient  daughter Denies  patient having any side effects with her medication. ?Patient daughter Denies   patient having any problems with her current pharmacy ? ?Patient daughter denies having any questions/concerns for the clinical pharmacist.Patient daughter denies any refills needed at this time. ? ?Telephone follow up appointment with care management team member scheduled for:  10/29/2021 at 11:00 AM ? ?[name],CPA ?Clinical Pharmacist Assistant ?[phone]  ? ?

## 2021-06-26 ENCOUNTER — Telehealth: Payer: Self-pay | Admitting: Family Medicine

## 2021-06-26 DIAGNOSIS — I998 Other disorder of circulatory system: Secondary | ICD-10-CM

## 2021-06-26 NOTE — Telephone Encounter (Signed)
Pts daughter called stating her mother's legs are swollen and she would like an antibiotic.  ?

## 2021-06-29 MED ORDER — CEPHALEXIN 500 MG PO CAPS: 500.0000 mg | ORAL_CAPSULE | Freq: Four times a day (QID) | ORAL | 0 refills | Status: AC

## 2021-06-29 NOTE — Telephone Encounter (Signed)
Patient and daughter aware of antibiotics sent in will call back this week to schedule follow up appointment.  ?

## 2021-06-29 NOTE — Telephone Encounter (Signed)
Please advise message below, will patient need to come in for follow up on legs?  ?

## 2021-08-05 DIAGNOSIS — R310 Gross hematuria: Secondary | ICD-10-CM | POA: Diagnosis not present

## 2021-08-05 DIAGNOSIS — N200 Calculus of kidney: Secondary | ICD-10-CM | POA: Diagnosis not present

## 2021-08-05 DIAGNOSIS — R319 Hematuria, unspecified: Secondary | ICD-10-CM | POA: Diagnosis not present

## 2021-08-06 ENCOUNTER — Ambulatory Visit (INDEPENDENT_AMBULATORY_CARE_PROVIDER_SITE_OTHER): Payer: Medicare Other | Admitting: Family Medicine

## 2021-08-06 ENCOUNTER — Encounter: Payer: Self-pay | Admitting: Family Medicine

## 2021-08-06 VITALS — BP 124/72 | HR 82 | Temp 96.9°F | Ht 62.0 in | Wt 144.4 lb

## 2021-08-06 DIAGNOSIS — N200 Calculus of kidney: Secondary | ICD-10-CM | POA: Diagnosis not present

## 2021-08-06 DIAGNOSIS — I10 Essential (primary) hypertension: Secondary | ICD-10-CM | POA: Diagnosis not present

## 2021-08-06 DIAGNOSIS — D696 Thrombocytopenia, unspecified: Secondary | ICD-10-CM | POA: Diagnosis not present

## 2021-08-06 DIAGNOSIS — D649 Anemia, unspecified: Secondary | ICD-10-CM | POA: Diagnosis not present

## 2021-08-06 DIAGNOSIS — N1832 Chronic kidney disease, stage 3b: Secondary | ICD-10-CM

## 2021-08-06 LAB — BASIC METABOLIC PANEL
BUN: 20 mg/dL (ref 6–23)
CO2: 29 mEq/L (ref 19–32)
Calcium: 10.5 mg/dL (ref 8.4–10.5)
Chloride: 108 mEq/L (ref 96–112)
Creatinine, Ser: 1.12 mg/dL (ref 0.40–1.20)
GFR: 42.77 mL/min — ABNORMAL LOW (ref >60.00)
Glucose, Bld: 129 mg/dL — ABNORMAL HIGH (ref 70–99)
Potassium: 4.1 mEq/L (ref 3.5–5.1)
Sodium: 144 mEq/L (ref 135–145)

## 2021-08-06 LAB — CBC
HCT: 39.6 % (ref 36.0–46.0)
Hemoglobin: 12.3 g/dL (ref 12.0–15.0)
MCHC: 31 g/dL (ref 30.0–36.0)
MCV: 84.7 fl (ref 78.0–100.0)
Platelets: 96 10*3/uL — ABNORMAL LOW (ref 150.0–400.0)
RBC: 4.68 Mil/uL (ref 3.87–5.11)
RDW: 16.6 % — ABNORMAL HIGH (ref 11.5–15.5)
WBC: 5.4 10*3/uL (ref 4.0–10.5)

## 2021-08-06 NOTE — Progress Notes (Signed)
Established Patient Office Visit  Subjective   Patient ID: Chelsea Dean, female    DOB: January 31, 1928  Age: 88 y.o. MRN: [id number]  Chief Complaint  Patient presents with   Follow-up    3 month follow up, discuss kidney stones lots blood in urine seen urologist yesterday. Patient fasting.     HPI for follow-up of hypertension, normocytic anemia with thrombocytopenia stage IIIb kidney disease.  Recently diagnosed with asymptomatic stones in her right renal pelvis.     Review of Systems  Constitutional:  Negative for chills, diaphoresis, malaise/fatigue and weight loss.  HENT: Negative.    Eyes: Negative.  Negative for blurred vision and double vision.  Respiratory: Negative.    Cardiovascular:  Negative for chest pain.  Gastrointestinal:  Negative for abdominal pain.  Genitourinary: Negative.   Musculoskeletal:  Negative for falls and myalgias.  Neurological:  Positive for weakness. Negative for speech change and loss of consciousness.  Psychiatric/Behavioral: Negative.        Objective:     BP 124/72 (BP Location: Left Arm, Patient Position: Sitting, Cuff Size: Normal)   Pulse 82   Temp (!) 96.9 F (36.1 C) (Temporal)   Ht '5\' 2"'$  (1.575 m)   Wt 144 lb 6.4 oz (65.5 kg)   SpO2 97%   BMI 26.41 kg/m    Physical Exam Constitutional:      General: She is not in acute distress.    Appearance: Normal appearance. She is not ill-appearing, toxic-appearing or diaphoretic.  HENT:     Head: Normocephalic and atraumatic.     Right Ear: External ear normal.     Left Ear: External ear normal.  Eyes:     General: No scleral icterus.       Right eye: No discharge.        Left eye: No discharge.     Extraocular Movements: Extraocular movements intact.     Conjunctiva/sclera: Conjunctivae normal.  Cardiovascular:     Rate and Rhythm: Normal rate and regular rhythm.  Pulmonary:     Effort: Pulmonary effort is normal. No respiratory distress.     Breath sounds: Normal breath  sounds.  Abdominal:     General: Bowel sounds are normal.     Tenderness: There is no right CVA tenderness or left CVA tenderness.  Musculoskeletal:     Cervical back: No rigidity or tenderness.  Skin:    General: Skin is warm and dry.  Neurological:     Mental Status: She is alert and oriented to person, place, and time.  Psychiatric:        Mood and Affect: Mood normal.        Behavior: Behavior normal.      No results found for any visits on 08/06/21.    The ASCVD Risk score (Arnett DK, et al., 2019) failed to calculate for the following reasons:   The 2019 ASCVD risk score is only valid for ages 52 to 27    Assessment & Plan:   Problem List Items Addressed This Visit       Cardiovascular and Mediastinum   Essential hypertension - Primary   Relevant Orders   Basic metabolic panel     Genitourinary   Stage 3 chronic kidney disease ([place])   Relevant Orders   Basic metabolic panel   Renal lithiasis     Hematopoietic and Hemostatic   Thrombocytopenia ([place])   Relevant Orders   CBC     Other   Normocytic anemia  Relevant Orders   Multple Myeloma [place], Reflex to sIFE and sFLC   Iron, TIBC and Ferritin Panel    Return in about 3 months (around 11/06/2021).  Hypertension and diabetes are well controlled on her current regimen.  CKD has been stable.  There is mild normocytic anemia with thrombocytopenia.   [name], MD

## 2021-08-07 LAB — IRON,TIBC AND FERRITIN PANEL
%SAT: 21 % (calc) (ref 16–45)
Ferritin: 97 ng/mL (ref 16–288)
Iron: 69 ug/dL (ref 45–160)
TIBC: 323 mcg/dL (calc) (ref 250–450)

## 2021-08-13 ENCOUNTER — Other Ambulatory Visit: Payer: Self-pay | Admitting: Family Medicine

## 2021-08-13 DIAGNOSIS — E119 Type 2 diabetes mellitus without complications: Secondary | ICD-10-CM

## 2021-08-13 DIAGNOSIS — N1831 Chronic kidney disease, stage 3a: Secondary | ICD-10-CM

## 2021-08-13 LAB — MULTIPLE MYELOMA CASCADE WITH REFLEX TO SIFE AND SFLC
A/G Ratio: 1.1 (ref 0.7–1.7)
Albumin ELP: 3.7 g/dL (ref 2.9–4.4)
Alpha 1: 0.3 g/dL (ref 0.0–0.4)
Alpha 2: 0.8 g/dL (ref 0.4–1.0)
Beta: 1 g/dL (ref 0.7–1.3)
Gamma Globulin: 1.2 g/dL (ref 0.4–1.8)
Globulin, Total: 3.4 g/dL (ref 2.2–3.9)
Reflex Testing: 0
Total Protein: 7.1 g/dL (ref 6.0–8.5)

## 2021-08-13 LAB — IMMUNOFIXATION REFLEX, SERUM
IgA/Immunoglobulin A, Serum: 191 mg/dL (ref 64–422)
IgG (Immunoglobin G), Serum: 1169 mg/dL (ref 586–1602)
IgM (Immunoglobulin M), Srm: 32 mg/dL (ref 26–217)

## 2021-08-19 DIAGNOSIS — E119 Type 2 diabetes mellitus without complications: Secondary | ICD-10-CM | POA: Diagnosis not present

## 2021-08-19 LAB — HM DIABETES EYE EXAM

## 2021-09-03 ENCOUNTER — Ambulatory Visit (INDEPENDENT_AMBULATORY_CARE_PROVIDER_SITE_OTHER): Payer: Medicare Other

## 2021-09-03 DIAGNOSIS — Z0000 Encounter for general adult medical examination without abnormal findings: Secondary | ICD-10-CM

## 2021-09-03 NOTE — Progress Notes (Signed)
Subjective:   Chelsea Dean is a 88 y.o. female who presents for Medicare Annual (Subsequent) preventive examination.   I connected with Sharmaine Base with assistantance of daughter Genene Churn on 09/03/2021 by a telephone enabled telemedicine application and verified that I am speaking with the correct person using two identifiers.  DOB & name   I discussed the limitations of evaluation and management by telemedicine. The patient expressed understanding and agreed to proceed.       Review of Systems   N/A       Objective:    There were no vitals filed for this visit. There is no height or weight on file to calculate BMI.     11/24/2020    4:58 PM 11/24/2020    1:19 PM 11/21/2020    7:17 PM 11/21/2020    7:05 PM 11/17/2020   10:41 AM 08/26/2020    9:52 AM 04/01/2020    7:13 AM  Advanced Directives  Does Patient Have a Medical Advance Directive? Yes Yes Yes Yes Yes No;Yes No  Type of Paramedic of [place];Living will Healthcare Power of [place];Living will  [place];Living will [place];Living will   Does patient want to make changes to medical advance directive? No - Patient declined Yes (ED - Information included in AVS) No - Patient declined      Copy of [place] in Chart? No - copy requested  No - copy requested  No - copy requested No - copy requested   Would patient like information on creating a medical advance directive?       No - Patient declined    Current Medications (verified) Outpatient Encounter Medications as of 09/03/2021  Medication Sig   acetaminophen (TYLENOL) 650 MG CR tablet Take 650-1,300 mg by mouth every 8 (eight) hours as needed for pain.   amLODipine (NORVASC) 10 MG tablet TAKE 1 TABLET BY MOUTH EVERY DAY FOR BLOOD PRESSURE   atorvastatin (LIPITOR) 10 MG tablet TAKE 1 TABLET BY MOUTH EVERY DAY   diclofenac sodium (VOLTAREN) 1 % GEL  Apply 2 g topically daily as needed (KNEE PAIN).   ELIQUIS 5 MG TABS tablet TAKE 1 TABLET BY MOUTH TWICE DAILY FOR 30 DAYS (02/13/20 TO INDEFINITELY)   JARDIANCE 10 MG TABS tablet TAKE 1 TABLET BY MOUTH EVERY MORNING BEFORE BREAKFAST   losartan (COZAAR) 100 MG tablet TAKE 1 TABLET BY MOUTH EVERY DAY   metoprolol tartrate (LOPRESSOR) 50 MG tablet Take 1 tablet (50 mg total) by mouth 2 (two) times daily.   traMADol (ULTRAM) 50 MG tablet TAKE 1 TABLET BY MOUTH EVERY 12 HOURS ASNEEDED FOR PAIN   traZODone (DESYREL) 50 MG tablet 1/2 po q hs prn sleep   Facility-Administered Encounter Medications as of 09/03/2021  Medication   triamcinolone acetonide (KENALOG-40) injection 80 mg    Allergies (verified) Ace inhibitors, Contrast media [iodinated contrast media], and Nsaids   History: Past Medical History:  Diagnosis Date   A-fib ([place]) 10/19/2016   Abdominal aortic aneurysm (AAA) without rupture ([place]) 09/15/2013   Acute idiopathic gout of right foot    AKI (acute kidney injury) ([place]) 10/18/2016   Anticoagulated on Coumadin    Arthritis    Diabetes mellitus without complication ([place])    Diverticular disease of large intestine 04/16/2013   Diverticulosis    DM2 (diabetes mellitus, type 2) ([place]) 10/19/2016   Dysrhythmia    Atrial fib   History  of colon polyps 04/16/2013   Hx of colonic polyps    Hyperlipidemia    Hypertension    Long term current use of anticoagulant therapy 02/05/2015   Lumbar radiculopathy 03/28/2014   Microalbuminuria 04/16/2013   Osteoarthritis 04/16/2013   Osteoarthritis of cervical spine 03/28/2014   Pelvic mass 10/19/2016   Right renal mass    SBO (small bowel obstruction) ([place]) 10/18/2016   Thrombocytopenia ([place]) 04/16/2013   Overview:  referred to hematology 01/2104   Past Surgical History:  Procedure Laterality Date   ABDOMINAL AORTIC ANEURYSM REPAIR     ABDOMINAL HYSTERECTOMY     APPLICATION OF WOUND VAC Left 11/26/2020   Procedure: APPLICATION OF  WOUND VAC;  Surgeon: Cherre Robins, MD;  Location: [facility];  Service: Vascular;  Laterality: Left;   INCISION AND DRAINAGE Left 11/26/2020   Procedure: INCISION AND DRAINAGE OF LEFT LEG;  Surgeon: Cherre Robins, MD;  Location: [facility];  Service: Vascular;  Laterality: Left;   IR ANGIOGRAM EXTREMITY LEFT  04/01/2020   IR ANGIOGRAM PELVIS SELECTIVE OR SUPRASELECTIVE  04/01/2020   IR ANGIOGRAM SELECTIVE EACH ADDITIONAL VESSEL  04/01/2020   IR ANGIOGRAM SELECTIVE EACH ADDITIONAL VESSEL  04/01/2020   IR ANGIOGRAM SELECTIVE EACH ADDITIONAL VESSEL  04/01/2020   IR AORTAGRAM ABDOMINAL SERIALOGRAM  04/01/2020   IR EMBO ARTERIAL NOT HEMORR HEMANG INC GUIDE ROADMAPPING  04/01/2020   IR GENERIC HISTORICAL  07/01/2015   IR RADIOLOGIST EVAL & MGMT 07/01/2015 [facility] INTERV RAD   IR RADIOLOGIST EVAL & MGMT  03/11/2020   IR RADIOLOGIST EVAL & MGMT  04/30/2020   IR RADIOLOGIST EVAL & MGMT  06/22/2021   IR THROMBECT PRIM MECH INIT (INCLU) MOD SED  04/01/2020   IR US GUIDE VASC ACCESS LEFT  04/01/2020   IR US GUIDE VASC ACCESS LEFT  04/01/2020   IR US GUIDE VASC ACCESS RIGHT  04/01/2020   THROMBECTOMY FEMORAL ARTERY Left 04/01/2020   Procedure: THROMBECTOMY LEFT POPLITEAL ARTERY;  Surgeon: Rosetta Posner, MD;  [facility];  Service: Vascular;  Laterality: Left;   Family History  Problem Relation Age of Onset   Cancer Sister        Rectal and Stomach   Social History   Socioeconomic History   Marital status: Married    Spouse name: Not on file   Number of children: Not on file   Years of education: Not on file   Highest education level: Not on file  Occupational History   Occupation: retired [employer]  Tobacco Use   Smoking status: Never   Smokeless tobacco: Current    Types: Snuff  Vaping Use   Vaping Use: Never used  Substance and Sexual Activity   Alcohol use: No    Alcohol/week: 0.0 standard drinks of alcohol   Drug use: No   Sexual activity: Not on file  Other Topics Concern   Not on file   Social History Narrative   Admitted to [facility] 10/27/16   Married   Use snuff   Alcohol none   DNR   Social Determinants of Health   Financial Resource Strain: Low Risk  (04/30/2021)   Overall Financial Resource Strain (CARDIA)    Difficulty of Paying Living Expenses: Not hard at all  Food Insecurity: No Food Insecurity (08/26/2020)   Hunger Vital Sign    Worried About Running Out of Food in the Last Year: Never true    [place] in the Last Year:  Never true  Transportation Needs: No Transportation Needs (08/26/2020)   PRAPARE - Hydrologist (Medical): No    Lack of Transportation (Non-Medical): No  Physical Activity: Insufficiently Active (08/26/2020)   Exercise Vital Sign    Days of Exercise per Week: 3 days    Minutes of Exercise per Session: 10 min  Stress: No Stress Concern Present (08/26/2020)   [place]    Feeling of Stress : Not at all  Social Connections: Socially Isolated (08/26/2020)   Social Connection and Isolation Panel [NHANES]    Frequency of Communication with Friends and Family: Three times a week    Frequency of Social Gatherings with Friends and Family: Once a week    Attends Religious Services: Never    Marine scientist or Organizations: No    Attends Archivist Meetings: Never    Marital Status: Widowed    Tobacco Counseling Ready to quit: Not Answered Counseling given: Not Answered   Clinical Intake:                 Diabetic?YES         Activities of Daily Living    11/25/2020    1:00 PM 11/25/2020    1:13 AM  In your present state of health, do you have any difficulty performing the following activities:  Doing errands, shopping? 1 1    Patient Care Team: [name], MD as PCP - General (Family Medicine) Germaine Pomfret, [facility] as Pharmacist (Pharmacist)  Indicate any recent  Medical Services you may have received from other than [facility] providers in the past year (date may be approximate).     Assessment:   This is a routine wellness examination for Chelsea Dean.  Hearing/Vision screen No results found.  Dietary issues and exercise activities discussed:     Goals Addressed   None    Depression Screen    08/06/2021    9:06 AM 03/23/2021    9:53 AM 11/24/2020   11:28 AM 09/18/2020    1:30 PM 08/26/2020    9:59 AM 06/06/2020    8:59 AM 03/25/2020   11:21 AM  PHQ 2/9 Scores  PHQ - 2 Score 0 0 0 0 0 0 0    Fall Risk    08/06/2021    9:07 AM 03/23/2021    9:53 AM 11/24/2020   11:28 AM 09/18/2020    1:30 PM 06/06/2020    8:59 AM  Fall Risk   Falls in the past year? 0 0 0 1 0  Number falls in past yr: 0 0  1   Injury with Fall?    0     FALL RISK PREVENTION PERTAINING TO THE HOME:  Any stairs in or around the home? No  If so, are there any without handrails? Yes  Home free of loose throw rugs in walkways, pet beds, electrical cords, etc? Yes  Adequate lighting in your home to reduce risk of falls? Yes   ASSISTIVE DEVICES UTILIZED TO PREVENT FALLS:  Life alert? No  Use of a cane, walker or w/c? Yes  Grab bars in the bathroom? Yes  Shower chair or bench in shower? Yes  Elevated toilet seat or a handicapped toilet? Yes   TIMED UP AND GO:  Was the test performed? No .  Length of time to ambulate 10 feet: N/A sec.     Cognitive Function:    05/09/2019  2:43 PM  MMSE - Mini Mental State Exam  Not completed: Refused;Unable to complete        Immunizations Immunization History  Administered Date(s) Administered   Fluad Quad(high Dose 65+) 12/05/2018, 12/05/2019, 11/24/2020   Influenza, High Dose Seasonal PF 01/17/2014, 10/23/2014, 12/28/2017   Influenza,inj,Quad PF,6+ Mos 11/21/2015   Influenza-Unspecified 01/02/2013, 02/23/2013, 01/17/2014, 10/23/2014   Moderna Sars-Covid-2 Vaccination 04/17/2019, 05/15/2019   PPD Test 10/27/2016    Pneumococcal Conjugate-13 10/23/2014   Pneumococcal Polysaccharide-23 08/29/2007   Zoster Recombinat (Shingrix) 08/06/2021    TDAP status: Due, Education has been provided regarding the importance of this vaccine. Advised may receive this vaccine at local pharmacy or Health Dept. Aware to provide a copy of the vaccination record if obtained from local pharmacy or Health Dept. Verbalized acceptance and understanding.  Flu Vaccine status: Up to date  Pneumococcal vaccine status: Up to date  Covid-19 vaccine status: Completed vaccines  Qualifies for Shingles Vaccine? Yes   Zostavax completed No   Shingrix Completed?: No.    Education has been provided regarding the importance of this vaccine. Patient has been advised to call insurance company to determine out of pocket expense if they have not yet received this vaccine. Advised may also receive vaccine at local pharmacy or Health Dept. Verbalized acceptance and understanding.  Screening Tests Health Maintenance  Topic Date Due   DEXA SCAN  Never done   OPHTHALMOLOGY EXAM  02/24/2021   FOOT EXAM  06/06/2021   INFLUENZA VACCINE  09/15/2021   HEMOGLOBIN A1C  09/20/2021   Pneumonia Vaccine 25+ Years old  Completed   HPV VACCINES  Aged Out   TETANUS/TDAP  Discontinued   COVID-19 Vaccine  Discontinued   Zoster Vaccines- Shingrix  Discontinued    Health Maintenance  Health Maintenance Due  Topic Date Due   DEXA SCAN  Never done   OPHTHALMOLOGY EXAM  02/24/2021   FOOT EXAM  06/06/2021    Colorectal cancer screening: No longer required.   Mammogram status: No longer required due to age.  Bone Density: declined due to age  Lung Cancer Screening: (Low Dose CT Chest recommended if Age 55-80 years, 30 pack-year currently smoking OR have quit w/in 15years.) does not qualify.   Lung Cancer Screening Referral: NO  Additional Screening:  Hepatitis C Screening: does not qualify;  Vision Screening: Recommended annual ophthalmology  exams for early detection of glaucoma and other disorders of the eye. Is the patient up to date with their annual eye exam?  Yes  Who is the provider or what is the name of the office in which the patient attends annual eye exams? Dr Rober Minion If pt is not established with a provider, would they like to be referred to a provider to establish care? No .   Dental Screening: Recommended annual dental exams for proper oral hygiene  Community Resource Referral / Chronic Care Management: CRR required this visit?  No   CCM required this visit?  No      Plan:     I have personally reviewed and noted the following in the patient's chart:   Medical and social history Use of alcohol, tobacco or illicit drugs  Current medications and supplements including opioid prescriptions.  Functional ability and status Nutritional status Physical activity Advanced directives List of other physicians Hospitalizations, surgeries, and ER visits in previous 12 months Vitals Screenings to include cognitive, depression, and falls Referrals and appointments  In addition, I have reviewed and discussed with patient certain preventive  protocols, quality metrics, and best practice recommendations. A written personalized care plan for preventive services as well as general preventive health recommendations were provided to patient.     Konrad Saha, [place]   09/03/2021   Nurse Notes: none face to face.   40 mins encounter

## 2021-09-16 ENCOUNTER — Telehealth: Payer: Self-pay | Admitting: Family Medicine

## 2021-09-16 NOTE — Telephone Encounter (Signed)
Referral to hearing specialist? Please advise

## 2021-09-16 NOTE — Telephone Encounter (Signed)
Pt's daughter [name]) is wanting her mom to get a referral to a hearing specialist. I asked if this concern had been discussed in the past, she said, He knows she can't hear. Please advise Daisy @ [phone]

## 2021-09-17 NOTE — Telephone Encounter (Signed)
Patients daughter verbally understood to try [facility]

## 2021-09-28 ENCOUNTER — Encounter: Payer: Self-pay | Admitting: Family Medicine

## 2021-10-06 ENCOUNTER — Ambulatory Visit (INDEPENDENT_AMBULATORY_CARE_PROVIDER_SITE_OTHER): Payer: Medicare Other | Admitting: Family Medicine

## 2021-10-06 ENCOUNTER — Encounter: Payer: Self-pay | Admitting: Family Medicine

## 2021-10-06 VITALS — BP 128/72 | HR 66 | Temp 96.9°F | Ht 62.0 in | Wt 147.0 lb

## 2021-10-06 DIAGNOSIS — R29818 Other symptoms and signs involving the nervous system: Secondary | ICD-10-CM | POA: Diagnosis not present

## 2021-10-06 DIAGNOSIS — I10 Essential (primary) hypertension: Secondary | ICD-10-CM | POA: Diagnosis not present

## 2021-10-06 DIAGNOSIS — E1169 Type 2 diabetes mellitus with other specified complication: Secondary | ICD-10-CM | POA: Diagnosis not present

## 2021-10-06 DIAGNOSIS — I4891 Unspecified atrial fibrillation: Secondary | ICD-10-CM

## 2021-10-06 DIAGNOSIS — E785 Hyperlipidemia, unspecified: Secondary | ICD-10-CM

## 2021-10-06 NOTE — Progress Notes (Signed)
[facility] [address] [place] [address] Dept: [phone] Dept Fax: [phone]  Office Visit  Subjective:    Patient ID: Chelsea Dean, female    DOB: 1927-11-07, 88 y.o..   MRN: [id number]  Chief Complaint  Patient presents with   Acute Visit    C/o having dizzy spells x 3 days and also had some slurred speech x 1 day.       History of Present Illness:  Patient is in today for assessment of a recent spell this past Saturday. Chelsea Dean notes that she had onset of dizziness. She describes an episode where she sat up int he bed, felt dizzy,a dn then fell over on her left side. That same day, her daughter noted her to have some slurred speech that last for about a day. She had some accompanying weakness, but no particular loss of function of either arm or leg. All of this has resolved over the past several days, except maybe some mild dizziness still present. Chelsea Dean has a history of atrial fibrillation and is on Eliquis for anticoagulation. She had hypertension managed on amlodipine 10 mg daily, losartan 100 mg daily, and metoprolol 50 mg bid (also for rate control of her a. fib).  Past Medical History: Patient Active Problem List   Diagnosis Date Noted   Renal lithiasis 08/06/2021   Normocytic anemia 08/06/2021   Behavioral insomnia of childhood 12/04/2020   Cellulitis of left leg 12/04/2020   Abscess of left leg 11/24/2020   Abscess 11/24/2020   Hyperlipidemia associated with type 2 diabetes mellitus ([place]) 06/06/2020   Ischemic leg 04/01/2020   BRBPR (bright red blood per rectum) 02/12/2020   Stage 4 chronic kidney disease ([place]) 09/21/2019   Elevated LDL cholesterol level 07/19/2018   Need for influenza vaccination 12/28/2017   Chronic pain of both knees 10/19/2017   Stage 3 chronic kidney disease ([place]) 07/25/2017   Hypomagnesemia 03/31/2017   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/31/2017   Abnormal urinalysis 03/16/2017    Hypokalemia 10/31/2016   Lactic acidosis 10/31/2016   Atrial fibrillation with RVR ([place]) 10/31/2016   Elevated liver enzymes 10/31/2016   Anticoagulated on Coumadin 10/31/2016   Elevated INR 10/31/2016   Hyperlipidemia 10/31/2016   Essential hypertension 10/31/2016   Acute idiopathic gout of right foot    A-fib ([place]) 10/19/2016   Type 2 diabetes mellitus without complication, without long-term current use of insulin ([place]) 10/19/2016   Pelvic mass 10/19/2016   SBO (small bowel obstruction) ([place]) 10/18/2016   Bilateral sensorineural hearing loss 01/16/2016   Hypercalcemia 11/28/2015   Ceruminosis 11/21/2015   Endoleak post (EVAR) endovascular aneurysm repair 06/19/2015   Long term current use of anticoagulant therapy 02/05/2015   Encounter for therapeutic drug level monitoring 02/05/2015   Lumbar radiculopathy 03/28/2014   Osteoarthritis of cervical spine 03/28/2014   Other specified disorders of kidney and ureter 01/24/2014   Abdominal aortic aneurysm (AAA) without rupture ([place]) 09/15/2013   Breast screening declined 07/10/2013   Diverticular disease of large intestine 04/16/2013   History of colon polyps 04/16/2013   Microalbuminuria 04/16/2013   Osteoarthritis 04/16/2013   Thrombocytopenia ([place]) 04/16/2013   Past Surgical History:  Procedure Laterality Date   ABDOMINAL AORTIC ANEURYSM REPAIR     ABDOMINAL HYSTERECTOMY     APPLICATION OF WOUND VAC Left 11/26/2020   Procedure: APPLICATION OF WOUND VAC;  Surgeon: Cherre Robins, MD;  [facility];  Service: Vascular;  Laterality: Left;   INCISION AND DRAINAGE  Left 11/26/2020   Procedure: INCISION AND DRAINAGE OF LEFT LEG;  Surgeon: Cherre Robins, MD;  [facility];  Service: Vascular;  Laterality: Left;   IR ANGIOGRAM EXTREMITY LEFT  04/01/2020   IR ANGIOGRAM PELVIS SELECTIVE OR SUPRASELECTIVE  04/01/2020   IR ANGIOGRAM SELECTIVE EACH ADDITIONAL VESSEL  04/01/2020   IR ANGIOGRAM SELECTIVE EACH ADDITIONAL VESSEL   04/01/2020   IR ANGIOGRAM SELECTIVE EACH ADDITIONAL VESSEL  04/01/2020   IR AORTAGRAM ABDOMINAL SERIALOGRAM  04/01/2020   IR EMBO ARTERIAL NOT HEMORR HEMANG INC GUIDE ROADMAPPING  04/01/2020   IR GENERIC HISTORICAL  07/01/2015   IR RADIOLOGIST EVAL & MGMT 07/01/2015 [facility] INTERV RAD   IR RADIOLOGIST EVAL & MGMT  03/11/2020   IR RADIOLOGIST EVAL & MGMT  04/30/2020   IR RADIOLOGIST EVAL & MGMT  06/22/2021   IR THROMBECT PRIM MECH INIT (INCLU) MOD SED  04/01/2020   IR US GUIDE VASC ACCESS LEFT  04/01/2020   IR US GUIDE VASC ACCESS LEFT  04/01/2020   IR US GUIDE VASC ACCESS RIGHT  04/01/2020   THROMBECTOMY FEMORAL ARTERY Left 04/01/2020   Procedure: THROMBECTOMY LEFT POPLITEAL ARTERY;  Surgeon: Rosetta Posner, MD;  [facility];  Service: Vascular;  Laterality: Left;   Family History  Problem Relation Age of Onset   Cancer Sister        Rectal and Stomach   Outpatient Medications Prior to Visit  Medication Sig Dispense Refill   acetaminophen (TYLENOL) 650 MG CR tablet Take 650-1,300 mg by mouth every 8 (eight) hours as needed for pain.     amLODipine (NORVASC) 10 MG tablet TAKE 1 TABLET BY MOUTH EVERY DAY FOR BLOOD PRESSURE 90 tablet 1   atorvastatin (LIPITOR) 10 MG tablet TAKE 1 TABLET BY MOUTH EVERY DAY 90 tablet 3   diclofenac sodium (VOLTAREN) 1 % GEL Apply 2 g topically daily as needed (KNEE PAIN).     ELIQUIS 5 MG TABS tablet TAKE 1 TABLET BY MOUTH TWICE DAILY FOR 30 DAYS (02/13/20 TO INDEFINITELY) 60 tablet 5   JARDIANCE 10 MG TABS tablet TAKE 1 TABLET BY MOUTH EVERY MORNING BEFORE BREAKFAST 30 tablet 3   losartan (COZAAR) 100 MG tablet TAKE 1 TABLET BY MOUTH EVERY DAY 90 tablet 1   metoprolol tartrate (LOPRESSOR) 50 MG tablet Take 1 tablet (50 mg total) by mouth 2 (two) times daily. 180 tablet 3   Multiple Vitamin (MULTIVITAMIN) tablet Take 1 tablet by mouth daily.     traMADol (ULTRAM) 50 MG tablet TAKE 1 TABLET BY MOUTH EVERY 12 HOURS ASNEEDED FOR PAIN 60 tablet 0   traZODone (DESYREL) 50  MG tablet 1/2 po q hs prn sleep 30 tablet 1   Facility-Administered Medications Prior to Visit  Medication Dose Route Frequency Provider Last Rate Last Admin   triamcinolone acetonide (KENALOG-40) injection 80 mg  80 mg Intra-articular Once [name], MD       Allergies  Allergen Reactions   Ace Inhibitors Shortness Of Breath and Rash   Contrast Media [Iodinated Contrast Media] Shortness Of Breath and Rash   Nsaids     Unknown reaction    Objective:   Today's Vitals   10/06/21 1018  BP: 128/72  Pulse: 66  Temp: (!) 96.9 F (36.1 C)  TempSrc: Temporal  SpO2: 99%  Weight: 147 lb (66.7 kg)  Height: '5\' 2"'$  (1.575 m)   Body mass index is 26.89 kg/m.   General: Well developed, well nourished. No acute distress. HEENT: Normocephalic,  non-traumatic. PERRL, EOMI. Conjunctiva clear.  Neuro: CN II-XII grossly intact. Normal grip strength and movement of arms. Psych: Alert and oriented. Normal mood and affect.  Health Maintenance Due  Topic Date Due   DEXA SCAN  Never done   FOOT EXAM  06/06/2021   INFLUENZA VACCINE  09/15/2021   HEMOGLOBIN A1C  09/20/2021     Assessment & Plan:   1. Transient neurological symptoms Ms. Azar experienced a transient issue with dizziness and slurred speech. This may have represented a TIA, but could have been due to other pathology. I recommend we check a head CT to look for signs of stroke. I will refer her to neurology for further assessment. She should follow up with Dr. Ethelene Hal in 2 weeks to review her CT results and repeat her exam.  - CT HEAD WO CONTRAST (5MM); Future - Ambulatory referral to Neurology  2. Atrial fibrillation with RVR (HCC) Rate appears well controlled today. Currently on Eliquis 5 mg bid for stroke prevention.  3. Essential hypertension Blood pressure is in good control. Continue amlodipine 10 mg daily, losartan 100 mg daily, and metoprolol 50 mg bid  4. Hyperlipidemia associated with type 2 diabetes  mellitus (HCC) On atorvastatin 10 mg daily.   Return in about 2 weeks (around 10/20/2021) for Reassessment.   [name], MD

## 2021-10-07 ENCOUNTER — Telehealth: Payer: Self-pay

## 2021-10-07 NOTE — Addendum Note (Signed)
Addended by: [name] on: 10/07/2021 06:02 PM   Modules accepted: Orders

## 2021-10-07 NOTE — Telephone Encounter (Signed)
Patients daughter calling states they would prefer to go to [facility] in [facility] for ordered CT that's currently for [facility] location. Could new order be placed for [facility] in [facility]? Please advise.

## 2021-10-09 ENCOUNTER — Other Ambulatory Visit: Payer: Self-pay | Admitting: Family Medicine

## 2021-10-09 DIAGNOSIS — I10 Essential (primary) hypertension: Secondary | ICD-10-CM

## 2021-10-09 DIAGNOSIS — E119 Type 2 diabetes mellitus without complications: Secondary | ICD-10-CM

## 2021-10-09 DIAGNOSIS — N183 Chronic kidney disease, stage 3 unspecified: Secondary | ICD-10-CM

## 2021-10-14 ENCOUNTER — Ambulatory Visit (HOSPITAL_BASED_OUTPATIENT_CLINIC_OR_DEPARTMENT_OTHER)
Admission: RE | Admit: 2021-10-14 | Discharge: 2021-10-14 | Disposition: A | Discharge status: Home / Self Care | Payer: Medicare Other | Source: Ambulatory Visit | Attending: Family Medicine | Admitting: Family Medicine

## 2021-10-14 DIAGNOSIS — R29818 Other symptoms and signs involving the nervous system: Secondary | ICD-10-CM | POA: Insufficient documentation

## 2021-10-14 DIAGNOSIS — R42 Dizziness and giddiness: Secondary | ICD-10-CM | POA: Diagnosis not present

## 2021-10-14 DIAGNOSIS — M852 Hyperostosis of skull: Secondary | ICD-10-CM | POA: Diagnosis not present

## 2021-10-21 ENCOUNTER — Ambulatory Visit (INDEPENDENT_AMBULATORY_CARE_PROVIDER_SITE_OTHER): Payer: Medicare Other | Admitting: Family Medicine

## 2021-10-21 ENCOUNTER — Encounter: Payer: Self-pay | Admitting: Family Medicine

## 2021-10-21 VITALS — Temp 97.6°F | Ht 62.0 in | Wt 144.8 lb

## 2021-10-21 DIAGNOSIS — E1169 Type 2 diabetes mellitus with other specified complication: Secondary | ICD-10-CM

## 2021-10-21 DIAGNOSIS — E119 Type 2 diabetes mellitus without complications: Secondary | ICD-10-CM

## 2021-10-21 DIAGNOSIS — N1832 Chronic kidney disease, stage 3b: Secondary | ICD-10-CM

## 2021-10-21 DIAGNOSIS — I4891 Unspecified atrial fibrillation: Secondary | ICD-10-CM

## 2021-10-21 DIAGNOSIS — E785 Hyperlipidemia, unspecified: Secondary | ICD-10-CM

## 2021-10-21 DIAGNOSIS — I10 Essential (primary) hypertension: Secondary | ICD-10-CM | POA: Diagnosis not present

## 2021-10-21 LAB — BASIC METABOLIC PANEL
BUN: 22 mg/dL (ref 6–23)
CO2: 26 mEq/L (ref 19–32)
Calcium: 10.4 mg/dL (ref 8.4–10.5)
Chloride: 108 mEq/L (ref 96–112)
Creatinine, Ser: 1.29 mg/dL — ABNORMAL HIGH (ref 0.40–1.20)
GFR: 36.04 mL/min — ABNORMAL LOW (ref >60.00)
Glucose, Bld: 182 mg/dL — ABNORMAL HIGH (ref 70–99)
Potassium: 3.4 mEq/L — ABNORMAL LOW (ref 3.5–5.1)
Sodium: 143 mEq/L (ref 135–145)

## 2021-10-21 LAB — URINALYSIS, ROUTINE W REFLEX MICROSCOPIC
Bilirubin Urine: NEGATIVE
Hgb urine dipstick: NEGATIVE
Ketones, ur: NEGATIVE
Leukocytes,Ua: NEGATIVE
Nitrite: NEGATIVE
RBC / HPF: NONE SEEN (ref 0–?)
Specific Gravity, Urine: 1.02 (ref 1.000–1.030)
Total Protein, Urine: NEGATIVE
Urine Glucose: >=1000 — AB
Urobilinogen, UA: 0.2 (ref 0.0–1.0)
pH: 5.5 (ref 5.0–8.0)

## 2021-10-21 LAB — LDL CHOLESTEROL, DIRECT: Direct LDL: 82 mg/dL

## 2021-10-21 LAB — HEMOGLOBIN A1C: Hgb A1c MFr Bld: 7 % — ABNORMAL HIGH (ref 4.6–6.5)

## 2021-10-21 MED ORDER — APIXABAN 5 MG PO TABS: ORAL_TABLET | ORAL | 5 refills | Status: DC

## 2021-10-21 NOTE — Progress Notes (Signed)
Established Patient Office Visit  Subjective   Patient ID: Chelsea Dean, female    DOB: 12-01-1927  Age: 88 y.o. MRN: [id number]  Chief Complaint  Patient presents with   Follow-up    2 month follow up concerns about frequent dizzy spells. Discuss CT results.     HPI mostly stable and doing okay.  Continues to deal with some lightheadedness particularly with standing.  Has no definite sensation of spinning.  Still enjoys tea once daily but is reluctant to drink fluids past that.  Blood pressures been well controlled with amlodipine losartan and metoprolol.  Continues atorvastatin.  No issues with Jardiance including urination.    Review of Systems  Constitutional: Negative.   HENT: Negative.    Eyes:  Negative for blurred vision, discharge and redness.  Respiratory: Negative.    Cardiovascular: Negative.   Gastrointestinal:  Negative for abdominal pain.  Genitourinary: Negative.   Musculoskeletal: Negative.  Negative for myalgias.  Skin:  Negative for rash.  Neurological:  Negative for dizziness, tingling, loss of consciousness and headaches.  Endo/Heme/Allergies:  Negative for polydipsia.      Objective:     Temp 97.6 F (36.4 C) (Temporal)   Ht '5\' 2"'$  (1.575 m)   Wt 144 lb 12.8 oz (65.7 kg)   SpO2 97%   BMI 26.48 kg/m  BP Readings from Last 3 Encounters:  10/06/21 128/72  08/06/21 124/72  04/13/21 115/74   Wt Readings from Last 3 Encounters:  10/21/21 144 lb 12.8 oz (65.7 kg)  10/06/21 147 lb (66.7 kg)  08/06/21 144 lb 6.4 oz (65.5 kg)      Physical Exam Constitutional:      General: She is not in acute distress.    Appearance: Normal appearance. She is not ill-appearing, toxic-appearing or diaphoretic.  HENT:     Head: Normocephalic and atraumatic.     Right Ear: Tympanic membrane, ear canal and external ear normal.     Left Ear: Tympanic membrane, ear canal and external ear normal.  Eyes:     General: No scleral icterus.       Right eye: No  discharge.        Left eye: No discharge.     Extraocular Movements: Extraocular movements intact.     Conjunctiva/sclera: Conjunctivae normal.  Cardiovascular:     Rate and Rhythm: Normal rate. Rhythm irregularly irregular.  Pulmonary:     Effort: Pulmonary effort is normal. No respiratory distress.     Breath sounds: Normal breath sounds.  Skin:    General: Skin is warm and dry.  Neurological:     Mental Status: She is alert and oriented to person, place, and time.  Psychiatric:        Mood and Affect: Mood normal.        Behavior: Behavior normal.      No results found for any visits on 10/21/21.    The ASCVD Risk score (Arnett DK, et al., 2019) failed to calculate for the following reasons:   The 2019 ASCVD risk score is only valid for ages 34 to 41    Assessment & Plan:   Problem List Items Addressed This Visit       Cardiovascular and Mediastinum   Atrial fibrillation with RVR (HCC)   Relevant Medications   apixaban (ELIQUIS) 5 MG TABS tablet   Essential hypertension - Primary   Relevant Medications   apixaban (ELIQUIS) 5 MG TABS tablet   Other Relevant Orders   Basic metabolic  panel   Urinalysis, Routine w reflex microscopic     Endocrine   Type 2 diabetes mellitus without complication, without long-term current use of insulin (HCC)   Relevant Orders   Basic metabolic panel   Hemoglobin A1c   Hyperlipidemia associated with type 2 diabetes mellitus (HCC)   Relevant Medications   apixaban (ELIQUIS) 5 MG TABS tablet   Other Relevant Orders   LDL cholesterol, direct     Genitourinary   Stage 3 chronic kidney disease ([place])   Relevant Orders   Basic metabolic panel    Return in about 3 months (around 01/20/2022).  Believe that patient's lightheadedness is multifactorial.  She did not tilt with orthostatic blood pressure checks.  Loss of cardiac output with atrial fibs is contributing.   chronic dehydration is probably contributing.  Encourage fluids.   Could consider decreasing dosage of BP meds.  [name], MD

## 2021-10-28 ENCOUNTER — Telehealth: Payer: Self-pay | Admitting: Family Medicine

## 2021-10-28 ENCOUNTER — Telehealth: Payer: Self-pay

## 2021-10-28 DIAGNOSIS — U071 COVID-19: Secondary | ICD-10-CM | POA: Diagnosis not present

## 2021-10-28 DIAGNOSIS — J029 Acute pharyngitis, unspecified: Secondary | ICD-10-CM | POA: Diagnosis not present

## 2021-10-28 NOTE — Progress Notes (Signed)
Chronic Care Management APPOINTMENT REMINDER   Called Mack Thurmon, No answer, Unable to leave a voice message to confirm  appointment on 10/29/2021 at 11:00 via telephone visit with [name] , [place] Sleeper Clinical Pharmacist Assistant [phone]

## 2021-10-28 NOTE — Telephone Encounter (Signed)
Daisy brown (daughter ) called and stated that her mom has covid and would like for you to call her

## 2021-10-28 NOTE — Telephone Encounter (Signed)
Spoke with patients daughter who states that patient was just diagnosed with covid today at urgent care patient was prescribed Prednisone, Paxlovid and Albuterol inhaler they were also advised to start on OTC Zinc and vitamin C. Patients daughter calling to make sure all of the medications that patient was given will be okay with her taking all of her other medications? Please advise.

## 2021-10-29 ENCOUNTER — Telehealth: Payer: Medicare Other

## 2021-10-29 NOTE — Telephone Encounter (Signed)
Patient and daughter of message below

## 2021-11-06 ENCOUNTER — Ambulatory Visit: Payer: Medicare Other | Admitting: Family Medicine

## 2021-11-12 ENCOUNTER — Ambulatory Visit (INDEPENDENT_AMBULATORY_CARE_PROVIDER_SITE_OTHER): Payer: Medicare Other

## 2021-11-12 DIAGNOSIS — E1169 Type 2 diabetes mellitus with other specified complication: Secondary | ICD-10-CM

## 2021-11-12 DIAGNOSIS — E119 Type 2 diabetes mellitus without complications: Secondary | ICD-10-CM

## 2021-11-12 NOTE — Patient Instructions (Signed)
Visit Information It was great speaking with you today!  Please let me know if you have any questions about our visit.   Goals Addressed             This Visit's Progress    Monitor and Manage My Blood Sugar-Diabetes Type 2   On track    Timeframe:  Long-Range Goal Priority:  High Start Date:  05/06/2020                           Expected End Date:  11/07/2022                     Follow Up within 90 days   - check blood sugar at prescribed times - check blood sugar if I feel it is too high or too low - enter blood sugar readings and medication or insulin into daily log    Why is this important?   Checking your blood sugar at home helps to keep it from getting very high or very low.  Writing the results in a diary or log helps the doctor know how to care for you.  Your blood sugar log should have the time, date and the results.  Also, write down the amount of insulin or other medicine that you take.  Other information, like what you ate, exercise done and how you were feeling, will also be helpful.     Notes:         Patient Care Plan: General Pharmacy (Adult)     Problem Identified: Hypertension, Hyperlipidemia, Diabetes, Atrial Fibrillation and Chronic Kidney Disease   Priority: High     Long-Range Goal: Patient-Specific Goal   Start Date: 05/06/2020  Expected End Date: 11/13/2022  This Visit's Progress: On track  Recent Progress: On track  Priority: High  Note:   Current Barriers:  No barriers noted  Pharmacist Clinical Goal(s):  Patient will maintain control of diabetes as evidenced by A1c less than 8%  through collaboration with PharmD and provider.   Interventions: 1:1 collaboration with [name], MD regarding development and update of comprehensive plan of care as evidenced by provider attestation and co-signature Inter-disciplinary care team collaboration (see longitudinal plan of care) Comprehensive medication review performed; medication  list updated in electronic medical record  Hypertension (BP goal <140/90) -Controlled -Current treatment: Amlodipine 10 mg daily: Appropriate, Effective, Safe, Accessible Losartan 100 mg daily: Appropriate, Effective, Safe, Accessible  Metoprolol Tartrate 50 mg twice daily: Appropriate, Effective, Safe, Accessible  -Medications previously tried: 135/60s   -Current home readings: 128/76, was in the 160s while sick with Covid.   -Denies hypotensive/hypertensive symptoms  -Educated on Daily salt intake goal < 2300 mg; Exercise goal of 150 minutes per week; Importance of home blood pressure monitoring; -Counseled to monitor BP at home weekly, document, and provide log at future appointments -Recommended to continue current medication  Hyperlipidemia: (LDL goal < 100) -Controlled -Current treatment: Atorvastatin 10 mg daily  -Medications previously tried: NA  -Educated on Importance of limiting foods high in cholesterol; -Recommended to continue current medication  Diabetes (A1c goal <7%) -Controlled -Current medications Jardiance 10 mg daily: Appropriate, Effective, Safe, Accessible  -Medications previously tried: NA  -Current home glucose readings fasting glucose: 128,  post prandial glucose: NA -Denies hypoglycemic/hyperglycemic symptoms -Walking more frequently, appetite  -Recommended to continue current medication  Atrial Fibrillation (Goal: prevent stroke and major bleeding) -Controlled -CHADSVASC: 6 -Current treatment: Rate control: Metoprolol  Tartrate 100 mg twice daily  Anticoagulation: Eliquis 5 mg twice daily  -Medications previously tried: NA -Counseled on bleeding risk associated with Eliquis and importance of self-monitoring for signs/symptoms of bleeding; avoidance of NSAIDs due to increased bleeding risk with anticoagulants; -Recommended to continue current medication  Osteoarthritis (Goal: Minimize pain symptoms) -Controlled -Current treatment   Acetaminophen 650 CR two tablets in the morning Tramadol 50 mg twice daily as needed  Voltaren 1% gel twice daily  -Medications previously tried: NA  -Walks with walker, is careful to avoid falls  -Recommended to continue current medication  Chronic Kidney Injury -All medications assessed for renal dosing and appropriateness in chronic kidney disease. -Counseled on importance of adequate fluid intake, minimizing NSAID use.  -Recommended to continue current medication  Patient Goals/Self-Care Activities Patient will:  - check glucose daily before breakfast, document, and provide at future appointments check blood pressure weekly, document, and provide at future appointments  Follow Up Plan: Telephone follow up appointment with care management team member scheduled for:  05/12/2021 at 2:00 PM    Patient agreed to services and verbal consent obtained.   The patient verbalized understanding of instructions, educational materials, and care plan provided today and DECLINED offer to receive copy of patient instructions, educational materials, and care plan.   [name], PharmD, Para [place], CPP Clinical Pharmacist Practitioner  [facility]  [phone]

## 2021-11-12 NOTE — Progress Notes (Signed)
Chronic Care Management Pharmacy Note  11/12/2021 Name:  Chelsea Dean MRN:  [id number] DOB:  [birth year]  Summary: Patient presents for CCM follow-up. Today's visit was 100% conducted with Genene Churn, the patient's daughter and caregiver. She reports her mother has been doing well and has no complaints regarding her current medications  Recommendations/Changes made from today's visit: Continue current medications  Plan: CPP follow-up 6 months   Subjective: Chelsea Dean is an 88 y.o. year old female who is a primary patient of Chelsea Hal Mortimer Fries, MD.  The CCM team was consulted for assistance with disease management and care coordination needs.    Engaged with patient by telephone for follow up visit in response to provider referral for pharmacy case management and/or care coordination services.   Consent to Services:  The patient was given information about Chronic Care Management services, agreed to services, and gave verbal consent prior to initiation of services.  Please see initial visit note for detailed documentation.   Patient Care Team: [name], MD as PCP - General (Family Medicine) Germaine Pomfret, [facility] as Pharmacist (Pharmacist)  Recent office visits: 10/21/21: Patient presented to Dr. Ethelene Hal for follow-up. A1c 7.0%.  10/06/21: Patient presented to Dr. Gena Fray for follow-up. Referral to neurology.   Recent consult visits: 08/05/21: Patient presented to Dr. Venia Minks (Urology)    Hospital visits: 03/25/21: Patient presented to ED for hematuria.   Objective:  Lab Results  Component Value Date   CREATININE 1.29 (H) 10/21/2021   BUN 22 10/21/2021   GFR 36.04 (L) 10/21/2021   GFRNONAA 45 (L) 11/28/2020   GFRAA 51 (L) 04/01/2018   NA 143 10/21/2021   K 3.4 (L) 10/21/2021   CALCIUM 10.4 10/21/2021   CO2 26 10/21/2021   GLUCOSE 182 (H) 10/21/2021    Lab Results  Component Value Date/Time   HGBA1C 7.0 (H) 10/21/2021 11:38 AM   HGBA1C 6.3  03/23/2021 10:38 AM   GFR 36.04 (L) 10/21/2021 11:38 AM   GFR 42.77 (L) 08/06/2021 10:04 AM   MICROALBUR 40.1 (H) 06/06/2020 10:21 AM   MICROALBUR 83.5 (H) 09/21/2019 10:51 AM    Last diabetic Eye exam:  Lab Results  Component Value Date/Time   HMDIABEYEEXA No Retinopathy 08/19/2021 12:00 AM    Last diabetic Foot exam: No results found for: "HMDIABFOOTEX"   Lab Results  Component Value Date   CHOL 134 06/06/2020   HDL 33.10 (L) 06/06/2020   LDLCALC 73 06/06/2020   LDLDIRECT 82.0 10/21/2021   TRIG 135.0 06/06/2020   CHOLHDL 4 06/06/2020       Latest Ref Rng & Units 08/06/2021   10:26 AM 11/24/2020   11:55 PM 11/24/2020    1:29 PM  Hepatic Function  Total Protein 6.0 - 8.5 g/dL 7.1  6.8  7.7   Albumin 3.5 - 5.0 g/dL  3.2  3.6   AST 15 - 41 U/L  19  21   ALT 0 - 44 U/L  15  16   Alk Phosphatase 38 - 126 U/L  41  51   Total Bilirubin 0.3 - 1.2 mg/dL  0.8  0.6     Lab Results  Component Value Date/Time   TSH 3.26 03/02/2017 02:42 PM       Latest Ref Rng & Units 08/06/2021   10:04 AM 04/08/2021   11:55 AM 03/23/2021   10:38 AM  CBC  WBC 4.0 - 10.5 K/uL 5.4  9.3  4.0   Hemoglobin 12.0 - 15.0 g/dL 12.3  12.0  12.7   Hematocrit 36.0 - 46.0 % 39.6  38.7  41.8   Platelets 150.0 - 400.0 K/uL 96.0  72.0  92.0 Repeated and verified X2.     No results found for: "VD25OH"  Clinical ASCVD: No  The ASCVD Risk score (Arnett DK, et al., 2019) failed to calculate for the following reasons:   The 2019 ASCVD risk score is only valid for ages 45 to 37       10/21/2021   10:53 AM 09/03/2021    9:39 AM 08/06/2021    9:06 AM  Depression screen PHQ 2/9  Decreased Interest 0 0 0  Down, Depressed, Hopeless 0 0 0  PHQ - 2 Score 0 0 0     Social History   Tobacco Use  Smoking Status Never  Smokeless Tobacco Former   Types: Snuff   Quit date: 2018   BP Readings from Last 3 Encounters:  10/06/21 128/72  08/06/21 124/72  04/13/21 115/74   Pulse Readings from Last 3 Encounters:   10/06/21 66  08/06/21 82  04/13/21 73   Wt Readings from Last 3 Encounters:  10/21/21 144 lb 12.8 oz (65.7 kg)  10/06/21 147 lb (66.7 kg)  08/06/21 144 lb 6.4 oz (65.5 kg)   BMI Readings from Last 3 Encounters:  10/21/21 26.48 kg/m  10/06/21 26.89 kg/m  08/06/21 26.41 kg/m    Assessment/Interventions: Review of patient past medical history, allergies, medications, health status, including review of consultants reports, laboratory and other test data, was performed as part of comprehensive evaluation and provision of chronic care management services.   SDOH:  (Social Determinants of Health) assessments and interventions performed: Yes SDOH Interventions    Flowsheet Row Chronic Care Management from 04/30/2021 in [place] Management from 11/03/2020 in [place] from 08/26/2020 in [place] Management from 05/06/2020 in [place] Interventions -- -- Intervention Not Indicated --  Housing Interventions -- -- Intervention Not Indicated --  Transportation Interventions -- -- Intervention Not Indicated --  Financial Strain Interventions Intervention Not Indicated Intervention Not Indicated Intervention Not Indicated Intervention Not Indicated  Physical Activity Interventions -- -- Intervention Not Indicated --  Stress Interventions -- -- Intervention Not Indicated --  Social Connections Interventions -- -- Intervention Not Indicated --         CCM Care Plan  Allergies  Allergen Reactions   Ace Inhibitors Shortness Of Breath and Rash   Contrast Media [Iodinated Contrast Media] Shortness Of Breath and Rash   Nsaids     Unknown reaction    Medications Reviewed Today     Reviewed by [name], MD (Physician) on 10/21/21 at [place] List Status: <None>   Medication Order Taking? Sig  Documenting Provider Last Dose Status Informant  acetaminophen (TYLENOL) 650 MG CR tablet [id number] Yes Take 650-1,300 mg by mouth every 8 (eight) hours as needed for pain. [provider] Taking Active Child  amLODipine (NORVASC) 10 MG tablet [id number] Yes TAKE 1 TABLET BY MOUTH EVERY DAY FOR BLOOD PRESSURE [name], MD Taking Active   apixaban [facility]) 5 MG TABS tablet [id number]  TAKE 1 TABLET BY MOUTH TWICE DAILY [name], MD  Active   atorvastatin (LIPITOR) 10 MG tablet [id number] Yes TAKE 1 TABLET BY MOUTH EVERY DAY [name], MD Taking Active   diclofenac sodium (  VOLTAREN) 1 % GEL [id number] Yes Apply 2 g topically daily as needed (KNEE PAIN). [provider] Taking Active Child           Med Note Jilda Roche A   Wed Mar 26, 2020 11:11 AM)    JARDIANCE 10 MG TABS tablet [id number] Yes TAKE 1 TABLET BY MOUTH EVERY MORNING BEFORE BREAKFAST [name], MD Taking Active   losartan (COZAAR) 100 MG tablet [id number] Yes TAKE 1 TABLET BY MOUTH EVERY DAY [name], MD Taking Active   metoprolol tartrate (LOPRESSOR) 50 MG tablet [id number] Yes Take 1 tablet (50 mg total) by mouth 2 (two) times daily. [name], MD Taking Active   Multiple Vitamin (MULTIVITAMIN) tablet [id number] Yes Take 1 tablet by mouth daily. [provider] Taking Active   traMADol (ULTRAM) 50 MG tablet [id number] Yes TAKE 1 TABLET BY MOUTH EVERY 12 HOURS ASNEEDED FOR PAIN [name], MD Taking Active Child  traZODone (DESYREL) 50 MG tablet [id number] Yes 1/2 po q hs prn sleep [name], MD Taking Active   triamcinolone acetonide [facility]) injection 80 mg [id number]   [name], MD  Active             Patient Active Problem List   Diagnosis Date Noted   Renal lithiasis 08/06/2021   Normocytic anemia 08/06/2021   Behavioral insomnia of childhood 12/04/2020   Cellulitis of left leg  12/04/2020   Abscess of left leg 11/24/2020   Abscess 11/24/2020   Hyperlipidemia associated with type 2 diabetes mellitus ([place]) 06/06/2020   Ischemic leg 04/01/2020   BRBPR (bright red blood per rectum) 02/12/2020   Stage 4 chronic kidney disease ([place]) 09/21/2019   Elevated LDL cholesterol level 07/19/2018   Need for influenza vaccination 12/28/2017   Chronic pain of both knees 10/19/2017   Stage 3 chronic kidney disease ([place]) 07/25/2017   Hypomagnesemia 03/31/2017   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/31/2017   Abnormal urinalysis 03/16/2017   Hypokalemia 10/31/2016   Lactic acidosis 10/31/2016   Atrial fibrillation with RVR ([place]) 10/31/2016   Elevated liver enzymes 10/31/2016   Anticoagulated on Coumadin 10/31/2016   Elevated INR 10/31/2016   Hyperlipidemia 10/31/2016   Essential hypertension 10/31/2016   Acute idiopathic gout of right foot    A-fib ([place]) 10/19/2016   Type 2 diabetes mellitus without complication, without long-term current use of insulin ([place]) 10/19/2016   Pelvic mass 10/19/2016   SBO (small bowel obstruction) ([place]) 10/18/2016   Bilateral sensorineural hearing loss 01/16/2016   Hypercalcemia 11/28/2015   Ceruminosis 11/21/2015   Endoleak post (EVAR) endovascular aneurysm repair 06/19/2015   Long term current use of anticoagulant therapy 02/05/2015   Encounter for therapeutic drug level monitoring 02/05/2015   Lumbar radiculopathy 03/28/2014   Osteoarthritis of cervical spine 03/28/2014   Other specified disorders of kidney and ureter 01/24/2014   Abdominal aortic aneurysm (AAA) without rupture ([place]) 09/15/2013   Breast screening declined 07/10/2013   Diverticular disease of large intestine 04/16/2013   History of colon polyps 04/16/2013   Microalbuminuria 04/16/2013   Osteoarthritis 04/16/2013   Thrombocytopenia ([place]) 04/16/2013    Immunization History  Administered Date(s) Administered   Fluad Quad(high Dose 65+) 12/05/2018, 12/05/2019, 11/24/2020   Influenza,  High Dose Seasonal PF 01/17/2014, 10/23/2014, 12/28/2017   Influenza,inj,Quad PF,6+ Mos 11/21/2015   Influenza-Unspecified 01/02/2013, 02/23/2013, 01/17/2014, 10/23/2014   Moderna Sars-Covid-2 Vaccination 04/17/2019, 05/15/2019   PPD Test 10/27/2016   Pneumococcal Conjugate-13 10/23/2014   Pneumococcal Polysaccharide-23 08/29/2007   Zoster  Recombinat (Shingrix) 08/06/2021    Conditions to be addressed/monitored:  Hypertension, Hyperlipidemia, Diabetes, Atrial Fibrillation and Chronic Kidney Disease  Care Plan : General Pharmacy (Adult)  Updates made by Germaine Pomfret, RPH since 11/12/2021 12:00 AM     Problem: Hypertension, Hyperlipidemia, Diabetes, Atrial Fibrillation and Chronic Kidney Disease   Priority: High     Long-Range Goal: Patient-Specific Goal   Start Date: 05/06/2020  Expected End Date: 11/13/2022  This Visit's Progress: On track  Recent Progress: On track  Priority: High  Note:   Current Barriers:  No barriers noted  Pharmacist Clinical Goal(s):  Patient will maintain control of diabetes as evidenced by A1c less than 8%  through collaboration with PharmD and provider.   Interventions: 1:1 collaboration with [name], MD regarding development and update of comprehensive plan of care as evidenced by provider attestation and co-signature Inter-disciplinary care team collaboration (see longitudinal plan of care) Comprehensive medication review performed; medication list updated in electronic medical record  Hypertension (BP goal <140/90) -Controlled -Current treatment: Amlodipine 10 mg daily: Appropriate, Effective, Safe, Accessible Losartan 100 mg daily: Appropriate, Effective, Safe, Accessible  Metoprolol Tartrate 50 mg twice daily: Appropriate, Effective, Safe, Accessible  -Medications previously tried: 135/60s   -Current home readings: 128/76, was in the 160s while sick with Covid.   -Denies hypotensive/hypertensive symptoms  -Educated on  Daily salt intake goal < 2300 mg; Exercise goal of 150 minutes per week; Importance of home blood pressure monitoring; -Counseled to monitor BP at home weekly, document, and provide log at future appointments -Recommended to continue current medication  Hyperlipidemia: (LDL goal < 100) -Controlled -Current treatment: Atorvastatin 10 mg daily  -Medications previously tried: NA  -Educated on Importance of limiting foods high in cholesterol; -Recommended to continue current medication  Diabetes (A1c goal <7%) -Controlled -Current medications Jardiance 10 mg daily: Appropriate, Effective, Safe, Accessible  -Medications previously tried: NA  -Current home glucose readings fasting glucose: 128,  post prandial glucose: NA -Denies hypoglycemic/hyperglycemic symptoms -Walking more frequently, appetite  -Recommended to continue current medication  Atrial Fibrillation (Goal: prevent stroke and major bleeding) -Controlled -CHADSVASC: 6 -Current treatment: Rate control: Metoprolol Tartrate 100 mg twice daily  Anticoagulation: Eliquis 5 mg twice daily  -Medications previously tried: NA -Counseled on bleeding risk associated with Eliquis and importance of self-monitoring for signs/symptoms of bleeding; avoidance of NSAIDs due to increased bleeding risk with anticoagulants; -Recommended to continue current medication  Osteoarthritis (Goal: Minimize pain symptoms) -Controlled -Current treatment  Acetaminophen 650 CR two tablets in the morning Tramadol 50 mg twice daily as needed  Voltaren 1% gel twice daily  -Medications previously tried: NA  -Walks with walker, is careful to avoid falls  -Recommended to continue current medication  Chronic Kidney Injury -All medications assessed for renal dosing and appropriateness in chronic kidney disease. -Counseled on importance of adequate fluid intake, minimizing NSAID use.  -Recommended to continue current medication  Patient  Goals/Self-Care Activities Patient will:  - check glucose daily before breakfast, document, and provide at future appointments check blood pressure weekly, document, and provide at future appointments  Follow Up Plan: Telephone follow up appointment with care management team member scheduled for:  05/12/2021 at 2:00 PM     Medication Assistance: None required.  Patient affirms current coverage meets needs.  Patient's preferred pharmacy is:  [place], [place] - [address] [place] [address] Phone: [phone] Fax: [facility] #[id number] - [place] [address] [place] [address] Phone: [phone] Fax: [facility] #[id number] - [place], [place] - [address] [place] [address] Phone: [phone] Fax: [phone]  Uses pill box? Yes Pt endorses 100% compliance  We discussed: Current pharmacy is preferred with insurance plan and patient is satisfied with pharmacy services Patient decided to: Continue current medication management strategy  Care Plan and Follow Up Patient Decision:  Patient agrees to Care Plan and Follow-up.  Plan: Telephone follow up appointment with care management team member scheduled for:  05/12/2021 at 2:00 PM  Doristine Section, [facility] [phone]

## 2021-11-14 DIAGNOSIS — I4891 Unspecified atrial fibrillation: Secondary | ICD-10-CM | POA: Diagnosis not present

## 2021-11-14 DIAGNOSIS — E785 Hyperlipidemia, unspecified: Secondary | ICD-10-CM

## 2021-11-14 DIAGNOSIS — M1990 Unspecified osteoarthritis, unspecified site: Secondary | ICD-10-CM | POA: Diagnosis not present

## 2021-11-14 DIAGNOSIS — E1159 Type 2 diabetes mellitus with other circulatory complications: Secondary | ICD-10-CM | POA: Diagnosis not present

## 2021-11-14 DIAGNOSIS — Z7984 Long term (current) use of oral hypoglycemic drugs: Secondary | ICD-10-CM

## 2021-11-14 DIAGNOSIS — I10 Essential (primary) hypertension: Secondary | ICD-10-CM

## 2021-11-23 ENCOUNTER — Ambulatory Visit: Payer: Medicare Other | Admitting: Neurology

## 2021-11-26 ENCOUNTER — Other Ambulatory Visit: Payer: Self-pay | Admitting: Family Medicine

## 2021-11-26 DIAGNOSIS — N1831 Chronic kidney disease, stage 3a: Secondary | ICD-10-CM

## 2021-11-26 DIAGNOSIS — E119 Type 2 diabetes mellitus without complications: Secondary | ICD-10-CM

## 2021-12-18 ENCOUNTER — Other Ambulatory Visit: Payer: Self-pay | Admitting: Family Medicine

## 2021-12-18 DIAGNOSIS — I10 Essential (primary) hypertension: Secondary | ICD-10-CM

## 2021-12-18 DIAGNOSIS — I4891 Unspecified atrial fibrillation: Secondary | ICD-10-CM

## 2022-01-01 ENCOUNTER — Telehealth: Payer: Self-pay | Admitting: Family Medicine

## 2022-01-01 DIAGNOSIS — L03116 Cellulitis of left lower limb: Secondary | ICD-10-CM

## 2022-01-01 NOTE — Telephone Encounter (Signed)
Caller Name: Genene Churn Call back phone #: [phone]  Reason for Call: Pt has infection in her leg. Please call daughter to go over recommendations

## 2022-01-04 MED ORDER — CEPHALEXIN 500 MG PO CAPS: 500.0000 mg | ORAL_CAPSULE | Freq: Three times a day (TID) | ORAL | 0 refills | Status: AC

## 2022-01-04 NOTE — Telephone Encounter (Signed)
Spoke with patients daughter who states that patient leg started to swell so they started her on some keflex that they had left over from the last time, would like to know if some more could be sent in? Swelling have gone down still a little red. Please advise.

## 2022-01-04 NOTE — Telephone Encounter (Signed)
Patient aware and will pick up Rx agrees to call if no improvement.

## 2022-01-04 NOTE — Addendum Note (Signed)
Addended by: Jon Billings on: 01/04/2022 01:37 PM   Modules accepted: Orders

## 2022-01-05 ENCOUNTER — Other Ambulatory Visit: Payer: Self-pay | Admitting: Family Medicine

## 2022-01-12 ENCOUNTER — Other Ambulatory Visit: Payer: Self-pay | Admitting: Family Medicine

## 2022-01-12 DIAGNOSIS — I10 Essential (primary) hypertension: Secondary | ICD-10-CM

## 2022-01-20 ENCOUNTER — Ambulatory Visit (INDEPENDENT_AMBULATORY_CARE_PROVIDER_SITE_OTHER): Payer: Medicare Other | Admitting: Family Medicine

## 2022-01-20 ENCOUNTER — Encounter: Payer: Self-pay | Admitting: Family Medicine

## 2022-01-20 VITALS — BP 134/72 | HR 66 | Temp 97.1°F | Ht 62.0 in | Wt 141.6 lb

## 2022-01-20 DIAGNOSIS — E876 Hypokalemia: Secondary | ICD-10-CM

## 2022-01-20 DIAGNOSIS — Z2911 Encounter for prophylactic immunotherapy for respiratory syncytial virus (RSV): Secondary | ICD-10-CM

## 2022-01-20 DIAGNOSIS — I10 Essential (primary) hypertension: Secondary | ICD-10-CM

## 2022-01-20 DIAGNOSIS — E119 Type 2 diabetes mellitus without complications: Secondary | ICD-10-CM

## 2022-01-20 DIAGNOSIS — Z23 Encounter for immunization: Secondary | ICD-10-CM | POA: Diagnosis not present

## 2022-01-20 DIAGNOSIS — H9110 Presbycusis, unspecified ear: Secondary | ICD-10-CM

## 2022-01-20 DIAGNOSIS — N1832 Chronic kidney disease, stage 3b: Secondary | ICD-10-CM | POA: Diagnosis not present

## 2022-01-20 LAB — BASIC METABOLIC PANEL
BUN: 17 mg/dL (ref 6–23)
CO2: 27 mEq/L (ref 19–32)
Calcium: 10 mg/dL (ref 8.4–10.5)
Chloride: 106 mEq/L (ref 96–112)
Creatinine, Ser: 1 mg/dL (ref 0.40–1.20)
GFR: 48.84 mL/min — ABNORMAL LOW (ref >60.00)
Glucose, Bld: 128 mg/dL — ABNORMAL HIGH (ref 70–99)
Potassium: 3.3 mEq/L — ABNORMAL LOW (ref 3.5–5.1)
Sodium: 142 mEq/L (ref 135–145)

## 2022-01-20 LAB — HEMOGLOBIN A1C: Hgb A1c MFr Bld: 6.5 % (ref 4.6–6.5)

## 2022-01-20 MED ORDER — RSVPREF3 VAC RECOMB ADJUVANTED 120 MCG/0.5ML IM SUSR: 0.5000 mL | Freq: Once | INTRAMUSCULAR | 0 refills | Status: AC

## 2022-01-20 NOTE — Progress Notes (Addendum)
Established Patient Office Visit   Subjective:  Patient ID: Chelsea Dean, female    DOB: [birth year]  Age: 88 y.o. MRN: [id number]  Chief Complaint  Patient presents with   Follow-up    3 month no concerns.     HPI Encounter Diagnoses  Name Primary?   Essential hypertension Yes   Stage 3b chronic kidney disease ([place])    Type 2 diabetes mellitus without complication, without long-term current use of insulin ([place])    Need for influenza vaccination    Presbycusis, unspecified laterality    Need for RSV immunization    Hypokalemia    Follow-up of hypertension, type 2 diabetes, lightheadedness.  Lightheadedness fortunately has resolved.  She is hydrating better.  She is learning to pause after she stands.  Blood pressure well controlled with amlodipine 10 mg, losartan 100 mg and metoprolol succinate 50 mg twice daily.  Diabetes controlled with empagliflozin.  Medication also used for CKD.  She remains so calm and serene.  Suffers from presbycusis.   Review of Systems  Constitutional: Negative.   HENT:  Positive for hearing loss.   Eyes:  Negative for blurred vision, discharge and redness.  Respiratory: Negative.    Cardiovascular: Negative.   Gastrointestinal:  Negative for abdominal pain.  Genitourinary: Negative.   Musculoskeletal: Negative.  Negative for myalgias.  Skin:  Negative for rash.  Neurological:  Negative for dizziness, tingling, loss of consciousness and weakness.  Endo/Heme/Allergies:  Negative for polydipsia.  Psychiatric/Behavioral: Negative.       Current Outpatient Medications:    acetaminophen (TYLENOL) 650 MG CR tablet, Take 650-1,300 mg by mouth every 8 (eight) hours as needed for pain., Disp: , Rfl:    amLODipine (NORVASC) 10 MG tablet, TAKE 1 TABLET BY MOUTH EVERY DAY FOR BLOOD PRESSURE, Disp: 90 tablet, Rfl: 1   apixaban (ELIQUIS) 5 MG TABS tablet, TAKE 1 TABLET BY MOUTH TWICE DAILY, Disp: 60 tablet, Rfl: 5   atorvastatin (LIPITOR) 10 MG tablet,  TAKE 1 TABLET BY MOUTH EVERY DAY, Disp: 90 tablet, Rfl: 3   diclofenac sodium (VOLTAREN) 1 % GEL, Apply 2 g topically daily as needed (KNEE PAIN)., Disp: , Rfl:    JARDIANCE 10 MG TABS tablet, TAKE 1 TABLET BY MOUTH EVERY MORNING BEFORE BREAKFAST, Disp: 30 tablet, Rfl: 3   losartan (COZAAR) 100 MG tablet, TAKE 1 TABLET BY MOUTH EVERY DAY, Disp: 90 tablet, Rfl: 1   metoprolol tartrate (LOPRESSOR) 50 MG tablet, TAKE 1 TABLET BY MOUTH 2 TIMES A DAY, Disp: 180 tablet, Rfl: 3   Multiple Vitamin (MULTIVITAMIN) tablet, Take 1 tablet by mouth daily., Disp: , Rfl:    traMADol (ULTRAM) 50 MG tablet, TAKE 1 TABLET BY MOUTH EVERY 12 HOURS ASNEEDED FOR PAIN, Disp: 60 tablet, Rfl: 0   traZODone (DESYREL) 50 MG tablet, 1/2 po q hs prn sleep, Disp: 30 tablet, Rfl: 1  Current Facility-Administered Medications:    triamcinolone acetonide (KENALOG-40) injection 80 mg, 80 mg, Intra-articular, Once, [name], MD   Objective:     BP 134/72 (BP Location: Left Arm, Patient Position: Sitting, Cuff Size: Normal)   Pulse 66   Temp (!) 97.1 F (36.2 C) (Temporal)   Ht '5\' 2"'$  (1.575 m)   Wt 141 lb 9.6 oz (64.2 kg)   SpO2 96%   BMI 25.90 kg/m    Physical Exam Constitutional:      General: She is not in acute distress.    Appearance: Normal appearance. She is not ill-appearing, toxic-appearing  or diaphoretic.  HENT:     Head: Normocephalic and atraumatic.     Right Ear: External ear normal.     Left Ear: External ear normal.  Eyes:     General: No scleral icterus.       Right eye: No discharge.        Left eye: No discharge.     Extraocular Movements: Extraocular movements intact.     Conjunctiva/sclera: Conjunctivae normal.  Cardiovascular:     Rate and Rhythm: Normal rate and regular rhythm.  Pulmonary:     Effort: Pulmonary effort is normal. No respiratory distress.     Breath sounds: Normal breath sounds.  Skin:    General: Skin is warm and dry.  Neurological:     Mental Status: She  is alert and oriented to person, place, and time.  Psychiatric:        Mood and Affect: Mood normal.        Behavior: Behavior normal.      Results for orders placed or performed in visit on 86/76/19  Basic metabolic panel  Result Value Ref Range   Sodium 142 135 - 145 mEq/L   Potassium 3.3 (L) 3.5 - 5.1 mEq/L   Chloride 106 96 - 112 mEq/L   CO2 27 19 - 32 mEq/L   Glucose, Bld 128 (H) 70 - 99 mg/dL   BUN 17 6 - 23 mg/dL   Creatinine, Ser 1.00 0.40 - 1.20 mg/dL   GFR 48.84 (L) >60.00 mL/min   Calcium 10.0 8.4 - 10.5 mg/dL  Hemoglobin A1c  Result Value Ref Range   Hgb A1c MFr Bld 6.5 4.6 - 6.5 %      The ASCVD Risk score (Arnett DK, et al., 2019) failed to calculate for the following reasons:   The 2019 ASCVD risk score is only valid for ages 59 to 36    Assessment & Plan:   Essential hypertension -     Basic metabolic panel  Stage 3b chronic kidney disease ([place]) -     Basic metabolic panel  Type 2 diabetes mellitus without complication, without long-term current use of insulin ([facility]) -     Basic metabolic panel -     Hemoglobin A1c  Need for influenza vaccination -     Flu vaccine HIGH DOSE PF  Presbycusis, unspecified laterality  Need for RSV immunization -     [id number] Vac Recomb Adjuvanted; Inject 0.5 mLs into the muscle once for 1 dose.  Dispense: 0.5 mL; Refill: 0  Hypokalemia -     Potassium; Future    Return in about 3 months (around 04/21/2022).  Continue all medications as above.  Adjustments made pending results of today's labs.  Information was given on presbycusis.  Recommended RSV vaccine.  Fortunately lightheadedness has resolved.  [name], MD

## 2022-01-22 DIAGNOSIS — Z2911 Encounter for prophylactic immunotherapy for respiratory syncytial virus (RSV): Secondary | ICD-10-CM | POA: Insufficient documentation

## 2022-01-22 NOTE — Addendum Note (Signed)
Addended by: Jon Billings on: 01/22/2022 08:17 AM   Modules accepted: Orders

## 2022-01-28 ENCOUNTER — Other Ambulatory Visit (INDEPENDENT_AMBULATORY_CARE_PROVIDER_SITE_OTHER): Payer: Medicare Other

## 2022-01-28 DIAGNOSIS — E876 Hypokalemia: Secondary | ICD-10-CM

## 2022-01-28 LAB — POTASSIUM: Potassium: 3.9 mEq/L (ref 3.5–5.1)

## 2022-02-17 DIAGNOSIS — H6121 Impacted cerumen, right ear: Secondary | ICD-10-CM | POA: Diagnosis not present

## 2022-02-23 ENCOUNTER — Encounter: Payer: Self-pay | Admitting: Family Medicine

## 2022-03-01 ENCOUNTER — Other Ambulatory Visit: Payer: Self-pay | Admitting: Family Medicine

## 2022-03-01 DIAGNOSIS — N1831 Chronic kidney disease, stage 3a: Secondary | ICD-10-CM

## 2022-03-01 DIAGNOSIS — E119 Type 2 diabetes mellitus without complications: Secondary | ICD-10-CM

## 2022-03-02 IMAGING — US IR EMBO ARTERIAL NOT [PERSON_NAME] INC GUIDE ROADMAPPING
1 series · 2 of 2 positions shown · non-contrast
Comparison: none

INDICATION: [AGE] female with a history of abdominal aortic aneurysm
status post endovascular aortic repair complicated by type 2
endoleak and an enlarging aneurysm sac. Additionally, she has
thrombosis of the left graft limb resulting in lifestyle limiting
left lower extremity claudication. Additionally, she has recurrence
of endoleak and further enlargement of the native aneurysm sac. This
is thought due to thrombosis of the stent graft limb and increasing
collateralization and retrograde flow into the aneurysm sac to
provide flow to the left lower extremity.

[Series 1: ir embo arterial not (person_name) inc guide roadm · 2 of 2 slices shown]
[im 1/2]
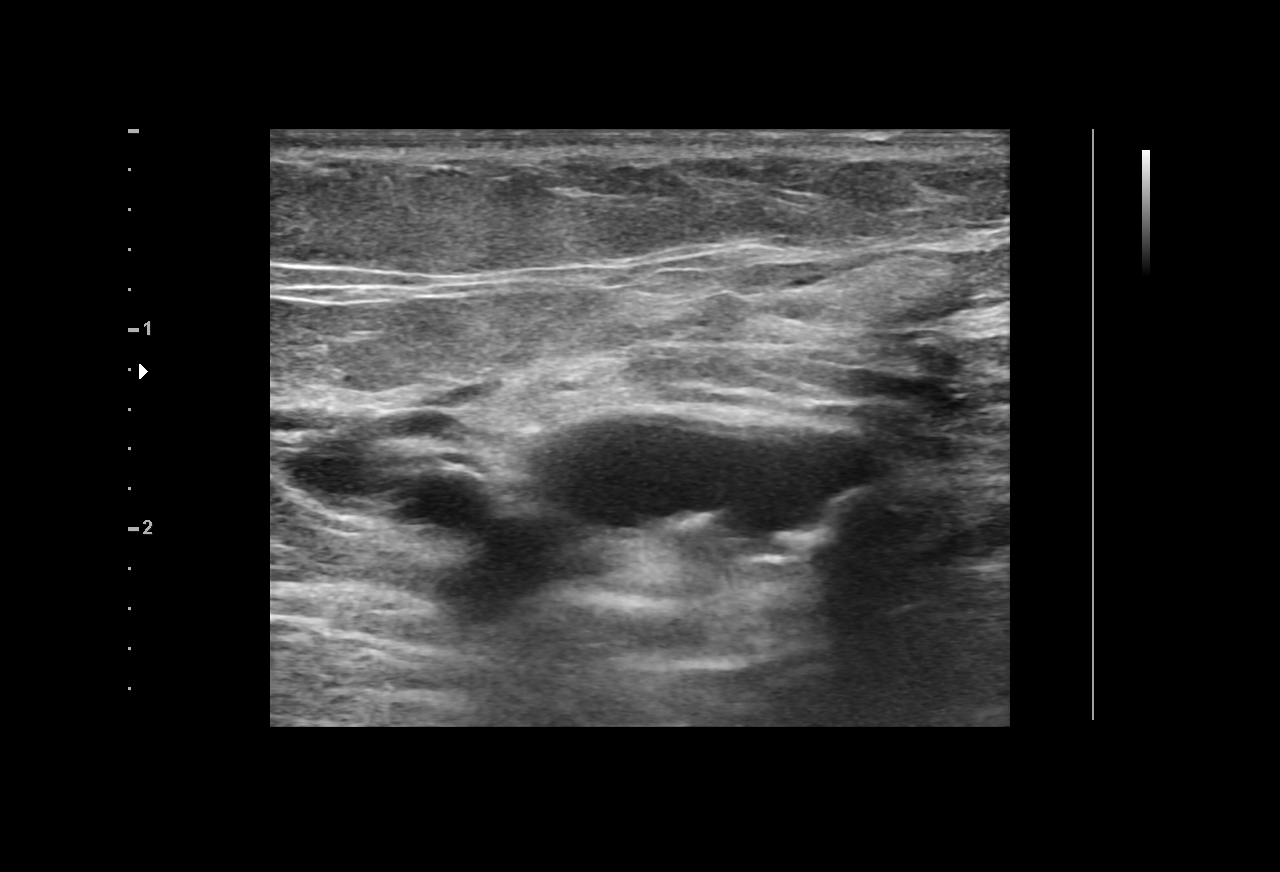
[im 2/2]
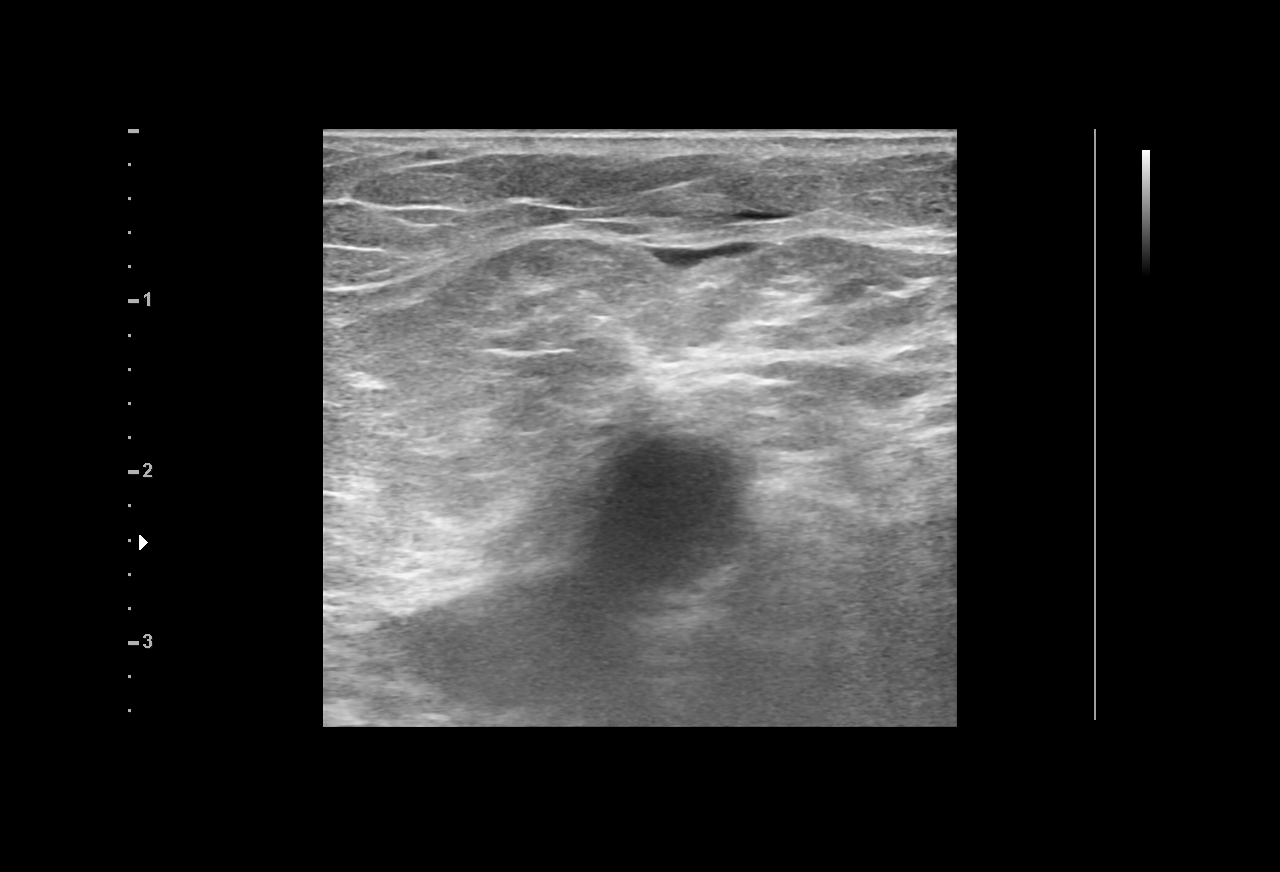

[2 of 2 positions shown; findings below may reference images not displayed]

She presents for endovascular endoleak repair as well as an attempt
at recanalization of the occluded stent graft limb.

EXAM:
IR EMBO ARTERIAL NOT HEMORR DIEDRICH INC GUIDE ROADMAPPING; IR
ULTRASOUND GUIDANCE VASC ACCESS RIGHT; BILATERAL EXTREMITY
ARTERIOGRAPHY; ADDITIONAL ARTERIOGRAPHY; IR ULTRASOUND GUIDANCE VASC
ACCESS LEFT; THROMBECTOMY MECHANICAL; AORTOGRAPHY

1. Ultrasound-guided access left common femoral artery
2. Ultrasound-guided access right common femoral artery
3. Aortogram with bilateral pelvic arteriogram
4. Catheterization of the left internal iliac artery with
arteriogram
5. Catheterization of the ascending iliolumbar artery with
arteriogram
6. Catheterization of the left L4 lumbar artery with arteriogram
7. Catheterization of the right L4 lumbar artery with arteriogram
8. Coil embolization of the right and left L4 lumbar arteries
9. Bilateral common femoral artery closure with 5 French Celt
devices
10. Ultrasound ultrasound-guided access (antegrade) left common
femoral artery
11. Left lower extremity arteriogram
12. Suction thrombectomy left popliteal artery

MEDICATIONS:
2 g Ancef. The antibiotic was administered within 1 hour of the
procedure

ANESTHESIA/SEDATION:
Moderate (conscious) sedation was employed during this procedure. A
total of 5 mg Valium p.o. and Fentanyl 67.5 mcg was administered
intravenously.

Moderate Sedation Time: 232 minutes. The patient's level of
consciousness and vital signs were monitored continuously by
radiology nursing throughout the procedure under my direct
supervision.

CONTRAST:  120 mL Omnipaque 300

FLUOROSCOPY TIME:  Fluoroscopy Time: 57 minutes 0 seconds (8086
mGy).

COMPLICATIONS:
SIR LEVEL C - Requires therapy, minor hospitalization (<48 hrs).

Distal embolization of organized thrombus/plaque necessitating
surgical embolectomy. Surgical embolectomy was successful and
patient was discharged home in good condition the following day.



The right common femoral artery was interrogated with ultrasound and
found to be widely patent. An image was obtained and stored for the
medical record. Local anesthesia was attained by infiltration with
1% lidocaine. A small dermatotomy was made. Under real-time
sonographic guidance, the vessel was punctured with a 21 gauge
micropuncture needle. Using standard technique, the initial micro
needle was exchanged over a 0.018 micro wire for a transitional 4
French micro sheath. The micro sheath was then exchanged over a
0.035 wire for a 5 French vascular sheath.

The left common femoral artery was interrogated with ultrasound and
found to be widely patent. An image was obtained and stored for the
medical record. Local anesthesia was attained by infiltration with
1% lidocaine. A small dermatotomy was made. Under real-time
sonographic guidance, the vessel was punctured with a 21 gauge
micropuncture needle. Using standard technique, the initial micro
needle was exchanged over a 0.018 micro wire for a transitional 4
French micro sheath. The micro sheath was then exchanged over a
0.035 wire for a 5 French vascular sheath.

From the right common femoral access, an Omni flush catheter was
advanced over a Bentson wire and positioned in the superior aspect
of the previously placed endograft. An aortogram and bilateral
pelvic arteriogram was then performed. Both renal arteries are
patent. The proximal landing zone of the stent graft is well
opposed. No evidence of type 1a endoleak. The right limb of the
graft remains patent. There is mild narrowing in the mid segment of
approximately 40%. Flush occlusion of the left endograft limb. There
is reconstitution of the left internal iliac artery via collateral
flow with slightly more delayed retrograde collateralization of the
left external and common femoral arteries. Nonocclusive
thrombus/plaque is present within the left external iliac artery.
The collateral pathway between the left ascending ileo lumbar artery
and left L4 lumbar artery is easily visualized. There is some very
subtle filling of the endo sac in the region of the right L4 lumbar
artery as expected.

Working through the left 5 French vascular sheath. The left internal
iliac artery was selected with a Glidewire and angled catheter.
During this process, it was noted that the plaque/thrombus within
the left external iliac artery was no longer visualized. There is
some concern that this may have embolized distally. This will be
assessed following attempted repair of the endoleak.

Ultimately, the left ascending ileo lumbar artery was successfully
catheterized using a combination of an angled renegade STC
microcatheter and a synchro soft wire. The microcatheter was
advanced in the ascending ileo lumbar artery. Contrast injection was
performed. This confirms strong retrograde flow into the internal
iliac artery. The microcatheter was further advanced into the left
L4 lumbar artery. Contrast injection was performed. Persistent
retrograde flow coming from the contralateral right L4 lumbar
artery. Therefore, the microcatheter was further advanced into the
right L4 lumbar artery. Contrast injection confirms antegrade flow.
There are small collateral branches arising from the L4 lumbar
artery and perfusing the excluded aneurysm sac. The microcatheter
was advanced beyond the origin of these branches and coil
embolization was performed. The coil pack was carried from the right
L4 lumbar artery into the left L4 lumbar artery. Repeat contrast
injection demonstrates no further opacification of the segments of
the artery and no further filling of the endo sac. Flow down the
more peripheral aspect of the left L4 lumbar artery and iliolumbar
artery remains retrograde.

The microcatheter and 5 French catheter were removed. Some attempts
were then made at crossing the occluded left iliac limb utilizing an
angled catheter and wire. This was unsuccessful. The thrombus within
the occluded iliac limb is quite dense and could not be crossed.

A limited left lower extremity arteriogram was then performed
through the 5 French vascular sheath. The external iliac and common
femoral arteries are widely patent. Small nonocclusive thrombus
within a branch of the profunda femoral artery. The proximal
superficial femoral artery is patent. However, flow is relatively
slow. This suggests distal occlusive thrombus as suspected.

Therefore, both 5 French femoral accesses were closed using 5 French
Celt closure devices.

The left common femoral artery was again interrogated with
ultrasound and found to be widely patent. An image was obtained and
stored for the medical record. Using the same dermatotomy from the
recently closed retrograde puncture, the vessel was punctured with a
21 gauge micropuncture needle in antegrade fashion. Using standard
technique, the initial micro needle was exchanged over a 0.018 micro
wire for a transitional 4 French micro sheath. The micro sheath was
then exchanged over a 0.035 wire for a 5 French sheath. A 5 French
catheter was advanced over a Bentson wire into the superficial
femoral artery and a formal left lower extremity arteriogram was
performed. There is focal occlusion in the popliteal artery
secondary to embolized thrombus/plaque.

A wire was carefully advanced beyond the level of occlusion. The 5
French sheath was exchanged for a 6 French Terumo destination
sheath. This was advanced into the distal superficial femoral
artery. The 0.035 wire was then exchanged for a V18 0.018 wire. A
CAT 6 penumbra suction thrombectomy catheter was then advanced over
the wire and into the region of thrombus. Suction thrombectomy was
then performed. This yielded only small amounts of thrombus in
resulted in clogging of the 6 French sheath.

Therefore, the 0.018 wire was reaction change for a 0.035 wire. The
6 French sheath was exchanged for an 8 French Terumo destination
sheath which was advanced into the distal SFA. The V18 wire was
readvanced beyond the region of thrombus. Additional suction
thrombectomy was performed using the CAT 8 penumbra suction
thrombectomy device. Again, minimal thrombus was successfully
extirpated. This is the largest suction thrombectomy device we have
axis 2. At this point, it was felt that the embolized
thrombus/plaque was too organized for suction thrombectomy. Vascular
surgery was consulted and they agree to take the patient to the
operating room for surgical embolectomy. The 8 French antegrade
sheath was left in place.

The results of the procedure and planned for further care were
discussed with Mrs. Tiger and her daughter. They understand and
desire to proceed.
IMPRESSION: 1. Successful coil occlusion of right L4 and left L4 lumbar arteries
resulting in cessation of contrast opacification into the excluded
native aneurysm sac.
2. Unsuccessful attempted recanalization of occluded left iliac
stent graft limb.
3. Distal embolization of organized thrombus/plaque from the left
external iliac artery occurred during catheterization of the left
internal iliac artery. Unsuccessful attempted suction embolectomy.
Patient will be taken to the OR for surgical embolectomy. Greatly
appreciate the assistance of Dr. Mexira with vascular surgery.

## 2022-03-05 ENCOUNTER — Telehealth: Payer: Self-pay

## 2022-03-05 NOTE — Progress Notes (Signed)
Care Management & Coordination Services Pharmacy Team  Reason for Encounter: Diabetes  Contacted patient on 03/05/2022 to discuss diabetes disease state.   Recent office visits:  01/20/2022 Dr, Ethelene Hal MD (PCP) No Medication changes noted, return in 3 months  Recent consult visits:  02/17/2022 Elias Else PA_C (Otolaryngology) No Medication changes noted  Hospital visits:  None in previous 6 months  Medications: Outpatient Encounter Medications as of 03/05/2022  Medication Sig   acetaminophen (TYLENOL) 650 MG CR tablet Take 650-1,300 mg by mouth every 8 (eight) hours as needed for pain.   amLODipine (NORVASC) 10 MG tablet TAKE 1 TABLET BY MOUTH EVERY DAY FOR BLOOD PRESSURE   apixaban (ELIQUIS) 5 MG TABS tablet TAKE 1 TABLET BY MOUTH TWICE DAILY   atorvastatin (LIPITOR) 10 MG tablet TAKE 1 TABLET BY MOUTH EVERY DAY   diclofenac sodium (VOLTAREN) 1 % GEL Apply 2 g topically daily as needed (KNEE PAIN).   empagliflozin (JARDIANCE) 10 MG TABS tablet TAKE 1 TABLET BY MOUTH EVERY MORNING BEFORE BREAKFAST   losartan (COZAAR) 100 MG tablet TAKE 1 TABLET BY MOUTH EVERY DAY   metoprolol tartrate (LOPRESSOR) 50 MG tablet TAKE 1 TABLET BY MOUTH 2 TIMES A DAY   Multiple Vitamin (MULTIVITAMIN) tablet Take 1 tablet by mouth daily.   traMADol (ULTRAM) 50 MG tablet TAKE 1 TABLET BY MOUTH EVERY 12 HOURS ASNEEDED FOR PAIN   traZODone (DESYREL) 50 MG tablet 1/2 po q hs prn sleep   Facility-Administered Encounter Medications as of 03/05/2022  Medication   triamcinolone acetonide (KENALOG-40) injection 80 mg    Recent Relevant Labs: Lab Results  Component Value Date/Time   HGBA1C 6.5 01/20/2022 10:43 AM   HGBA1C 7.0 (H) 10/21/2021 11:38 AM   MICROALBUR 40.1 (H) 06/06/2020 10:21 AM   MICROALBUR 83.5 (H) 09/21/2019 10:51 AM    Kidney Function Lab Results  Component Value Date/Time   CREATININE 1.00 01/20/2022 10:43 AM   CREATININE 1.29 (H) 10/21/2021 11:38 AM   GFR 48.84 (L) 01/20/2022 10:43  AM   GFRNONAA 45 (L) 11/28/2020 02:01 AM   GFRAA 51 (L) 04/01/2018 06:54 PM    Current antihyperglycemic regimen:  Jardiance 10 mg daily    Patient daughter verbally confirms she is taking the above medications as directed. Yes  What diet changes have been made to improve diabetes control?  What recent interventions/DTPs have been made to improve glycemic control:  None ID  Have there been any recent hospitalizations or ED visits since last visit with PharmD? No  Patient daughter denies hypoglycemic symptoms, including Pale, Sweaty, Shaky, Hungry, Nervous/irritable, and Vision changes  Patient daughter denies hyperglycemic symptoms, including blurry vision, excessive thirst, fatigue, polyuria, and weakness  How often are you checking your blood sugar? once daily  What are your blood sugars ranging?  Patient daughter states patient blood sugar ranges around 110-132. On 03/05/2022 it was 128.  During the week, how often does your blood glucose drop below 70? Never  Are you checking your feet daily/regularly? Yes Patient daughter states patient denies any concerns or questions regarding her feet.  Adherence Review: Is the patient currently on a STATIN medication? Yes Is the patient currently on ACE/ARB medication? No Does the patient have >5 day gap between last estimated fill dates? Yes  Star Rating Drugs:  Medication:Losartan 100 mg Last Fill: 10/09/2021 Day Supply 90 at [facility].  Medication:Atorvastatin 10  mg Last Fill: 02/01/2022 Day Supply 90 at [facility].  Medication:Jardiance10 mg  Last Fill: 02/24/2022 Day Supply  30 at [facility].   Care Gaps: Annual wellness visit in last year? Yes,AWV completed on 09/03/2021  Last eye exam / retinopathy screening:last completed 08/19/2021 Last diabetic foot exam: last completed 06/06/2021 Dexa Scan   Anderson Malta Clinical Pharmacist Assistant [phone]

## 2022-04-21 ENCOUNTER — Other Ambulatory Visit: Payer: Self-pay

## 2022-04-21 ENCOUNTER — Encounter: Payer: Self-pay | Admitting: Family Medicine

## 2022-04-21 ENCOUNTER — Ambulatory Visit (INDEPENDENT_AMBULATORY_CARE_PROVIDER_SITE_OTHER): Payer: 59 | Admitting: Family Medicine

## 2022-04-21 VITALS — BP 116/68 | HR 76 | Temp 97.7°F | Ht 62.0 in | Wt 142.0 lb

## 2022-04-21 DIAGNOSIS — N1832 Chronic kidney disease, stage 3b: Secondary | ICD-10-CM | POA: Diagnosis not present

## 2022-04-21 DIAGNOSIS — E7800 Pure hypercholesterolemia, unspecified: Secondary | ICD-10-CM

## 2022-04-21 DIAGNOSIS — I10 Essential (primary) hypertension: Secondary | ICD-10-CM

## 2022-04-21 DIAGNOSIS — R7303 Prediabetes: Secondary | ICD-10-CM | POA: Diagnosis not present

## 2022-04-21 LAB — BASIC METABOLIC PANEL
BUN: 21 mg/dL (ref 6–23)
CO2: 25 mEq/L (ref 19–32)
Calcium: 10.6 mg/dL — ABNORMAL HIGH (ref 8.4–10.5)
Chloride: 106 mEq/L (ref 96–112)
Creatinine, Ser: 1.03 mg/dL (ref 0.40–1.20)
GFR: 47.06 mL/min — ABNORMAL LOW (ref >60.00)
Glucose, Bld: 111 mg/dL — ABNORMAL HIGH (ref 70–99)
Potassium: 3.8 mEq/L (ref 3.5–5.1)
Sodium: 142 mEq/L (ref 135–145)

## 2022-04-21 LAB — CBC
HCT: 37.5 % (ref 36.0–46.0)
Hemoglobin: 11.8 g/dL — ABNORMAL LOW (ref 12.0–15.0)
MCHC: 31.5 g/dL (ref 30.0–36.0)
MCV: 80.2 fl (ref 78.0–100.0)
Platelets: 120 10*3/uL — ABNORMAL LOW (ref 150.0–400.0)
RBC: 4.68 Mil/uL (ref 3.87–5.11)
RDW: 17.3 % — ABNORMAL HIGH (ref 11.5–15.5)
WBC: 5.5 10*3/uL (ref 4.0–10.5)

## 2022-04-21 LAB — LDL CHOLESTEROL, DIRECT: Direct LDL: 77 mg/dL

## 2022-04-21 LAB — HEMOGLOBIN A1C: Hgb A1c MFr Bld: 6.5 % (ref 4.6–6.5)

## 2022-04-21 MED ORDER — AMLODIPINE BESYLATE 10 MG PO TABS: 10.0000 mg | ORAL_TABLET | Freq: Every day | ORAL | 1 refills | Status: DC

## 2022-04-21 NOTE — Progress Notes (Signed)
Established Patient Office Visit   Subjective:  Patient ID: Chelsea Dean, female    DOB: 1927-09-17  Age: 89 y.o. MRN: [id number]  Chief Complaint  Patient presents with   Medical Management of Chronic Issues    3 month follow up, no concerns. Patient fasting.     HPI Encounter Diagnoses  Name Primary?   Essential hypertension Yes   Stage 3b chronic kidney disease ([place])    Prediabetes    Elevated LDL cholesterol level    Doing well.  Accompanied by her daughter [name].  Has been exercising by walking up and down the driveway.  Continues current medications without issue.   Review of Systems  Constitutional: Negative.   HENT: Negative.    Eyes:  Negative for blurred vision, discharge and redness.  Respiratory: Negative.    Cardiovascular: Negative.   Gastrointestinal:  Negative for abdominal pain.  Genitourinary: Negative.   Musculoskeletal: Negative.  Negative for myalgias.  Skin:  Negative for rash.  Neurological:  Negative for tingling, loss of consciousness and weakness.  Endo/Heme/Allergies:  Negative for polydipsia.     Current Outpatient Medications:    acetaminophen (TYLENOL) 650 MG CR tablet, Take 650-1,300 mg by mouth every 8 (eight) hours as needed for pain., Disp: , Rfl:    amLODipine (NORVASC) 10 MG tablet, TAKE 1 TABLET BY MOUTH EVERY DAY FOR BLOOD PRESSURE, Disp: 90 tablet, Rfl: 1   apixaban (ELIQUIS) 5 MG TABS tablet, TAKE 1 TABLET BY MOUTH TWICE DAILY, Disp: 60 tablet, Rfl: 5   atorvastatin (LIPITOR) 10 MG tablet, TAKE 1 TABLET BY MOUTH EVERY DAY, Disp: 90 tablet, Rfl: 3   diclofenac sodium (VOLTAREN) 1 % GEL, Apply 2 g topically daily as needed (KNEE PAIN)., Disp: , Rfl:    empagliflozin (JARDIANCE) 10 MG TABS tablet, TAKE 1 TABLET BY MOUTH EVERY MORNING BEFORE BREAKFAST, Disp: 90 tablet, Rfl: 0   losartan (COZAAR) 100 MG tablet, TAKE 1 TABLET BY MOUTH EVERY DAY, Disp: 90 tablet, Rfl: 1   metoprolol tartrate (LOPRESSOR) 50 MG tablet, TAKE 1 TABLET BY  MOUTH 2 TIMES A DAY, Disp: 180 tablet, Rfl: 3   Multiple Vitamin (MULTIVITAMIN) tablet, Take 1 tablet by mouth daily., Disp: , Rfl:    traMADol (ULTRAM) 50 MG tablet, TAKE 1 TABLET BY MOUTH EVERY 12 HOURS ASNEEDED FOR PAIN, Disp: 60 tablet, Rfl: 0   traZODone (DESYREL) 50 MG tablet, 1/2 po q hs prn sleep, Disp: 30 tablet, Rfl: 1  Current Facility-Administered Medications:    triamcinolone acetonide (KENALOG-40) injection 80 mg, 80 mg, Intra-articular, Once, [name], MD   Objective:     BP 116/68 (BP Location: Left Arm, Patient Position: Sitting, Cuff Size: Normal)   Pulse 76   Temp 97.7 F (36.5 C) (Temporal)   Ht '5\' 2"'$  (1.575 m)   Wt 142 lb (64.4 kg)   SpO2 96%   BMI 25.97 kg/m    Physical Exam Constitutional:      General: She is not in acute distress.    Appearance: Normal appearance. She is not ill-appearing, toxic-appearing or diaphoretic.  HENT:     Head: Normocephalic and atraumatic.     Right Ear: External ear normal.     Left Ear: External ear normal.     Mouth/Throat:     Pharynx: No posterior oropharyngeal erythema.  Eyes:     General: No scleral icterus.       Right eye: No discharge.        Left eye: No  discharge.     Extraocular Movements: Extraocular movements intact.     Conjunctiva/sclera: Conjunctivae normal.     Pupils: Pupils are equal, round, and reactive to light.  Cardiovascular:     Rate and Rhythm: Normal rate and regular rhythm.  Pulmonary:     Effort: Pulmonary effort is normal. No respiratory distress.     Breath sounds: Normal breath sounds.  Musculoskeletal:     Cervical back: No rigidity or tenderness.  Skin:    General: Skin is warm and dry.  Neurological:     Mental Status: She is alert and oriented to person, place, and time.  Psychiatric:        Mood and Affect: Mood normal.        Behavior: Behavior normal.      No results found for any visits on 04/21/22.    The ASCVD Risk score (Arnett DK, et al., 2019)  failed to calculate for the following reasons:   The 2019 ASCVD risk score is only valid for ages 62 to 73    Assessment & Plan:   Essential hypertension -     Basic metabolic panel -     CBC  Stage 3b chronic kidney disease (HCC) -     Basic metabolic panel -     CBC  Prediabetes -     Basic metabolic panel -     Hemoglobin A1c  Elevated LDL cholesterol level -     LDL cholesterol, direct    Return in about 3 months (around 07/22/2022).  Continue current medications.  Adjustments will be made pending results of today's labs.  [name], MD

## 2022-05-13 ENCOUNTER — Ambulatory Visit: Payer: 59

## 2022-05-13 DIAGNOSIS — E119 Type 2 diabetes mellitus without complications: Secondary | ICD-10-CM

## 2022-05-13 DIAGNOSIS — I10 Essential (primary) hypertension: Secondary | ICD-10-CM

## 2022-05-13 NOTE — Progress Notes (Signed)
Care Management & Coordination Services Pharmacy Note  05/13/2022 Name:  Chelsea Dean MRN:  [id number] DOB:  Dec 05, 1927  Summary: Patient presents for follow-up consult.   Patient with hematuria due to kidney stone. Has urology follow-up next week.  Recommendations/Changes made from today's visit: Continue current medications   Follow up plan: CPP follow-up 6 months    Subjective: Chelsea Dean is an 89 y.o. year old female who is a primary patient of [name], MD.  The care coordination team was consulted for assistance with disease management and care coordination needs.    Engaged with patient by telephone for follow up visit.  Recent office visits: 04/21/2022: Patient presented to Dr. Ethelene Hal for follow-up.  01/20/22: Patient presented to Dr. Ethelene Hal for follow-up.   Recent consult visits: None noted  Hospital visits: None in previous 6 months  Objective:  Lab Results  Component Value Date   CREATININE 1.03 04/21/2022   BUN 21 04/21/2022   GFR 47.06 (L) 04/21/2022   GFRNONAA 45 (L) 11/28/2020   GFRAA 51 (L) 04/01/2018   NA 142 04/21/2022   K 3.8 04/21/2022   CALCIUM 10.6 (H) 04/21/2022   CO2 25 04/21/2022   GLUCOSE 111 (H) 04/21/2022    Lab Results  Component Value Date/Time   HGBA1C 6.5 04/21/2022 09:36 AM   HGBA1C 6.5 01/20/2022 10:43 AM   GFR 47.06 (L) 04/21/2022 09:36 AM   GFR 48.84 (L) 01/20/2022 10:43 AM   MICROALBUR 40.1 (H) 06/06/2020 10:21 AM   MICROALBUR 83.5 (H) 09/21/2019 10:51 AM    Last diabetic Eye exam:  Lab Results  Component Value Date/Time   HMDIABEYEEXA No Retinopathy 08/19/2021 12:00 AM    Last diabetic Foot exam: No results found for: "HMDIABFOOTEX"   Lab Results  Component Value Date   CHOL 134 06/06/2020   HDL 33.10 (L) 06/06/2020   LDLCALC 73 06/06/2020   LDLDIRECT 77.0 04/21/2022   TRIG 135.0 06/06/2020   CHOLHDL 4 06/06/2020       Latest Ref Rng & Units 08/06/2021   10:26 AM 11/24/2020   11:55 PM  11/24/2020    1:29 PM  Hepatic Function  Total Protein 6.0 - 8.5 g/dL 7.1  6.8  7.7   Albumin 3.5 - 5.0 g/dL  3.2  3.6   AST 15 - 41 U/L  19  21   ALT 0 - 44 U/L  15  16   Alk Phosphatase 38 - 126 U/L  41  51   Total Bilirubin 0.3 - 1.2 mg/dL  0.8  0.6     Lab Results  Component Value Date/Time   TSH 3.26 03/02/2017 02:42 PM       Latest Ref Rng & Units 04/21/2022    9:36 AM 08/06/2021   10:04 AM 04/08/2021   11:55 AM  CBC  WBC 4.0 - 10.5 K/uL 5.5  5.4  9.3   Hemoglobin 12.0 - 15.0 g/dL 11.8  12.3  12.0   Hematocrit 36.0 - 46.0 % 37.5  39.6  38.7   Platelets 150.0 - 400.0 K/uL 120.0  96.0  72.0     Lab Results  Component Value Date/Time   VITAMINB12 333 03/26/2019 11:58 AM    Clinical ASCVD: No  The ASCVD Risk score (Arnett DK, et al., 2019) failed to calculate for the following reasons:   The 2019 ASCVD risk score is only valid for ages 45 to 65       01/20/2022    9:54 AM 10/21/2021  10:53 AM 09/03/2021    9:39 AM  Depression screen PHQ 2/9  Decreased Interest 0 0 0  Down, Depressed, Hopeless 0 0 0  PHQ - 2 Score 0 0 0     Social History   Tobacco Use  Smoking Status Never  Smokeless Tobacco Former   Types: Snuff   Quit date: 2018   BP Readings from Last 3 Encounters:  04/21/22 116/68  01/20/22 134/72  10/06/21 128/72   Pulse Readings from Last 3 Encounters:  04/21/22 76  01/20/22 66  10/06/21 66   Wt Readings from Last 3 Encounters:  04/21/22 142 lb (64.4 kg)  01/20/22 141 lb 9.6 oz (64.2 kg)  10/21/21 144 lb 12.8 oz (65.7 kg)   BMI Readings from Last 3 Encounters:  04/21/22 25.97 kg/m  01/20/22 25.90 kg/m  10/21/21 26.48 kg/m    Allergies  Allergen Reactions   Ace Inhibitors Shortness Of Breath and Rash   Contrast Media [Iodinated Contrast Media] Shortness Of Breath and Rash   Nsaids     Unknown reaction    Medications Reviewed Today     Reviewed by [name], MD (Physician) on 04/21/22 at [place] List Status:  <None>   Medication Order Taking? Sig Documenting Provider Last Dose Status Informant  acetaminophen (TYLENOL) 650 MG CR tablet [id number] Yes Take 650-1,300 mg by mouth every 8 (eight) hours as needed for pain. [provider] Taking Active Child  amLODipine (NORVASC) 10 MG tablet [id number] Yes TAKE 1 TABLET BY MOUTH EVERY DAY FOR BLOOD PRESSURE [name], MD Taking Active   apixaban (ELIQUIS) 5 MG TABS tablet [id number] Yes TAKE 1 TABLET BY MOUTH TWICE DAILY [name], MD Taking Active   atorvastatin (LIPITOR) 10 MG tablet [id number] Yes TAKE 1 TABLET BY MOUTH EVERY DAY [name], MD Taking Active   diclofenac sodium (VOLTAREN) 1 % GEL [id number] Yes Apply 2 g topically daily as needed (KNEE PAIN). [provider] Taking Active Child           Med Note Jilda Roche A   Wed Mar 26, 2020 11:11 AM)    empagliflozin (JARDIANCE) 10 MG TABS tablet [id number] Yes TAKE 1 TABLET BY MOUTH EVERY MORNING BEFORE BREAKFAST McElwee, Lauren A, NP Taking Active   losartan (COZAAR) 100 MG tablet [id number] Yes TAKE 1 TABLET BY MOUTH EVERY DAY [name], MD Taking Active   metoprolol tartrate (LOPRESSOR) 50 MG tablet [id number] Yes TAKE 1 TABLET BY MOUTH 2 TIMES A DAY [name], MD Taking Active   Multiple Vitamin (MULTIVITAMIN) tablet [id number] Yes Take 1 tablet by mouth daily. [provider] Taking Active   traMADol (ULTRAM) 50 MG tablet [id number] Yes TAKE 1 TABLET BY MOUTH EVERY 12 HOURS ASNEEDED FOR PAIN [name], MD Taking Active Child  traZODone (DESYREL) 50 MG tablet [id number] Yes 1/2 po q hs prn sleep [name], MD Taking Active   triamcinolone acetonide [facility]) injection 80 mg [id number]   [name], MD  Active             SDOH:  (Social Determinants of Health) assessments and interventions performed: Yes SDOH Interventions    Flowsheet Row Chronic Care Management from  04/30/2021 in [facility] [place] Management from 11/03/2020 in [facility] from 08/26/2020 in [facility] [place] Management from 05/06/2020 in [facility] [place] Interventions      Food Insecurity Interventions -- -- Intervention Not Indicated --  Housing Interventions -- -- Intervention Not Indicated --  Transportation Interventions -- -- Intervention Not Indicated --  Financial Strain Interventions Intervention Not Indicated Intervention Not Indicated Intervention Not Indicated Intervention Not Indicated  Physical Activity Interventions -- -- Intervention Not Indicated --  Stress Interventions -- -- Intervention Not Indicated --  Social Connections Interventions -- -- Intervention Not Indicated --       Medication Assistance: None required.  Patient affirms current coverage meets needs.  Medication Access: Within the past 30 days, how often has patient missed a dose of medication? None Is a pillbox or other method used to improve adherence? Yes  Factors that may affect medication adherence? no barriers identified Are meds synced by current pharmacy? No  Are meds delivered by current pharmacy? No  Does patient experience delays in picking up medications due to transportation concerns? No   [facility] Services Reviewed: Is patient disadvantaged to use [facility]?: No  Current Rx insurance plan: [facility] Name and location of Current pharmacy:  [place] [place] - [address] [place] [address] Phone: [phone] Fax: [place] [facility] [id number] - [place], [place] AT [address] [place] [address] Phone: [phone] Fax: [phone]  [facility] [id number] - [place], [place] - [address] AT [address] [place] [address] Phone: [phone] Fax: [phone]  [facility] services reviewed with patient today?: No  Patient requests to transfer care to [facility]?: No  Reason patient declined to change pharmacies: Loyalty to other pharmacy/Patient preference  Compliance/Adherence/Medication fill history: Care Gaps: DEXA Foot Exam  Star-Rating Drugs: Atorvastatin 10 mg: Last filled 02/01/22 for 90-DS    Assessment/Plan  Hypertension (BP goal <140/90) -Controlled -Current treatment: Amlodipine 10 mg daily: Appropriate, Effective, Safe, Accessible Losartan 100 mg daily: Appropriate, Effective, Safe, Accessible  Metoprolol Tartrate 50 mg twice daily: Appropriate, Effective, Safe, Accessible  -Medications previously tried: NA -Current home readings: 135/60   -Denies hypotensive/hypertensive symptoms  -Educated on Daily salt intake goal < 2300 mg; Exercise goal of 150 minutes per week; Importance of home blood pressure monitoring; -Counseled to monitor BP at home weekly, document, and provide log at future appointments -Recommended to continue current medication  Hyperlipidemia: (LDL goal < 100) -Controlled -Current treatment: Atorvastatin 10 mg daily  -Medications previously tried: NA  -Educated on Importance of limiting foods high in cholesterol; -Recommended to continue current medication  Diabetes (A1c goal <7%) -Controlled -Current medications Jardiance 10 mg daily: Appropriate, Effective, Safe, Accessible  -Medications previously tried: NA  -Current home glucose readings fasting glucose: 128,  post prandial glucose: NA -Denies hypoglycemic/hyperglycemic symptoms -Walking more frequently, appetite  -Recommended to continue current medication  Atrial Fibrillation (Goal: prevent stroke and major bleeding) -Controlled -CHADSVASC: 6 -Current treatment: Rate control: Metoprolol Tartrate  100 mg twice daily  Anticoagulation: Eliquis 5 mg twice daily  -Medications previously tried: NA -Counseled on bleeding risk associated with Eliquis and importance of self-monitoring for signs/symptoms of bleeding; avoidance of NSAIDs due to increased bleeding risk with anticoagulants; -Recommended to continue current medication  Osteoarthritis (Goal: Minimize pain symptoms) -Controlled -Current treatment  Acetaminophen 650 CR two tablets in the morning Tramadol 50 mg twice daily as  needed  Voltaren 1% gel twice daily  -Medications previously tried: NA  -Walks with walker, is careful to avoid falls  -Recommended to continue current medication  Chronic Kidney Disease Stage 3a  -All medications assessed for renal dosing and appropriateness in chronic kidney disease. -Recommended to continue current medication  [name], PharmD, [place], CPP Clinical Pharmacist Practitioner  [facility]  [phone]

## 2022-05-19 DIAGNOSIS — R8290 Unspecified abnormal findings in urine: Secondary | ICD-10-CM | POA: Diagnosis not present

## 2022-05-19 DIAGNOSIS — R310 Gross hematuria: Secondary | ICD-10-CM | POA: Diagnosis not present

## 2022-05-20 DIAGNOSIS — R310 Gross hematuria: Secondary | ICD-10-CM | POA: Diagnosis not present

## 2022-05-24 ENCOUNTER — Other Ambulatory Visit: Payer: Self-pay | Admitting: Interventional Radiology

## 2022-05-24 DIAGNOSIS — I9789 Other postprocedural complications and disorders of the circulatory system, not elsewhere classified: Secondary | ICD-10-CM

## 2022-06-07 ENCOUNTER — Other Ambulatory Visit: Payer: Self-pay | Admitting: Family Medicine

## 2022-06-07 DIAGNOSIS — E119 Type 2 diabetes mellitus without complications: Secondary | ICD-10-CM

## 2022-06-07 DIAGNOSIS — I10 Essential (primary) hypertension: Secondary | ICD-10-CM

## 2022-06-07 DIAGNOSIS — N183 Chronic kidney disease, stage 3 unspecified: Secondary | ICD-10-CM

## 2022-06-23 ENCOUNTER — Other Ambulatory Visit: Payer: Self-pay | Admitting: Nurse Practitioner

## 2022-06-23 DIAGNOSIS — E119 Type 2 diabetes mellitus without complications: Secondary | ICD-10-CM

## 2022-06-23 DIAGNOSIS — N1831 Chronic kidney disease, stage 3a: Secondary | ICD-10-CM

## 2022-06-30 ENCOUNTER — Other Ambulatory Visit: Payer: Self-pay

## 2022-06-30 DIAGNOSIS — N1831 Chronic kidney disease, stage 3a: Secondary | ICD-10-CM

## 2022-06-30 DIAGNOSIS — E119 Type 2 diabetes mellitus without complications: Secondary | ICD-10-CM

## 2022-06-30 NOTE — Telephone Encounter (Signed)
Requesting: Jardiance 10mg  #90 Last Visit: 04/21/2022 Next Visit: Visit date not found Last Refill: 07/22/2022  Please Advise

## 2022-07-01 MED ORDER — EMPAGLIFLOZIN 10 MG PO TABS: ORAL_TABLET | ORAL | 0 refills | Status: DC

## 2022-07-02 DIAGNOSIS — R310 Gross hematuria: Secondary | ICD-10-CM | POA: Diagnosis not present

## 2022-07-02 DIAGNOSIS — N281 Cyst of kidney, acquired: Secondary | ICD-10-CM | POA: Diagnosis not present

## 2022-07-02 DIAGNOSIS — N200 Calculus of kidney: Secondary | ICD-10-CM | POA: Diagnosis not present

## 2022-07-02 DIAGNOSIS — R319 Hematuria, unspecified: Secondary | ICD-10-CM | POA: Diagnosis not present

## 2022-07-22 ENCOUNTER — Ambulatory Visit: Payer: 59 | Admitting: Family Medicine

## 2022-07-26 ENCOUNTER — Other Ambulatory Visit: Payer: Self-pay | Admitting: Family Medicine

## 2022-07-26 ENCOUNTER — Ambulatory Visit (INDEPENDENT_AMBULATORY_CARE_PROVIDER_SITE_OTHER): Payer: 59 | Admitting: Family Medicine

## 2022-07-26 ENCOUNTER — Encounter: Payer: Self-pay | Admitting: Family Medicine

## 2022-07-26 VITALS — BP 136/70 | HR 59 | Temp 97.8°F | Ht 62.0 in | Wt 141.0 lb

## 2022-07-26 DIAGNOSIS — E7800 Pure hypercholesterolemia, unspecified: Secondary | ICD-10-CM

## 2022-07-26 DIAGNOSIS — D509 Iron deficiency anemia, unspecified: Secondary | ICD-10-CM

## 2022-07-26 DIAGNOSIS — I10 Essential (primary) hypertension: Secondary | ICD-10-CM

## 2022-07-26 DIAGNOSIS — H9113 Presbycusis, bilateral: Secondary | ICD-10-CM | POA: Diagnosis not present

## 2022-07-26 DIAGNOSIS — R7303 Prediabetes: Secondary | ICD-10-CM

## 2022-07-26 DIAGNOSIS — N1832 Chronic kidney disease, stage 3b: Secondary | ICD-10-CM

## 2022-07-26 DIAGNOSIS — E611 Iron deficiency: Secondary | ICD-10-CM

## 2022-07-26 LAB — BASIC METABOLIC PANEL
BUN: 19 mg/dL (ref 6–23)
CO2: 25 mEq/L (ref 19–32)
Calcium: 9.8 mg/dL (ref 8.4–10.5)
Chloride: 105 mEq/L (ref 96–112)
Creatinine, Ser: 1.32 mg/dL — ABNORMAL HIGH (ref 0.40–1.20)
GFR: 34.88 mL/min — ABNORMAL LOW (ref >60.00)
Glucose, Bld: 94 mg/dL (ref 70–99)
Potassium: 3.4 mEq/L — ABNORMAL LOW (ref 3.5–5.1)
Sodium: 142 mEq/L (ref 135–145)

## 2022-07-26 LAB — CBC
HCT: 38 % (ref 36.0–46.0)
Hemoglobin: 11.5 g/dL — ABNORMAL LOW (ref 12.0–15.0)
MCHC: 30.4 g/dL (ref 30.0–36.0)
MCV: 79.1 fl (ref 78.0–100.0)
Platelets: 102 10*3/uL — ABNORMAL LOW (ref 150.0–400.0)
RBC: 4.8 Mil/uL (ref 3.87–5.11)
RDW: 16.7 % — ABNORMAL HIGH (ref 11.5–15.5)
WBC: 5.6 10*3/uL (ref 4.0–10.5)

## 2022-07-26 LAB — HEMOGLOBIN A1C: Hgb A1c MFr Bld: 6.5 % (ref 4.6–6.5)

## 2022-07-26 NOTE — Progress Notes (Signed)
Established Patient Office Visit   Subjective:  Patient ID: Chelsea Dean, female    DOB: 02/03/1928  Age: 89 y.o. MRN: [id number]  Chief Complaint  Patient presents with   Medical Management of Chronic Issues    3 month follow up, would like ears irrigated. No concerns.     HPI Encounter Diagnoses  Name Primary?   Essential hypertension Yes   Stage 3b chronic kidney disease (HCC)    Prediabetes    Microcytic anemia    Presbycusis of both ears    Follow-up of above accompanied by her daughter Toney Reil who expresses concern about her mom's hearing.  Hearing has declined despite using amplification.  She wonders about wax.  Continues all other medications as directed.   Review of Systems  Constitutional: Negative.   HENT:  Positive for hearing loss. Negative for ear discharge and ear pain.   Eyes:  Negative for blurred vision, discharge and redness.  Respiratory: Negative.    Cardiovascular: Negative.   Gastrointestinal:  Negative for abdominal pain, blood in stool and melena.  Genitourinary: Negative.  Negative for hematuria.  Musculoskeletal: Negative.  Negative for myalgias.  Skin:  Negative for rash.  Neurological:  Negative for tingling, loss of consciousness and weakness.  Endo/Heme/Allergies:  Negative for polydipsia.     Current Outpatient Medications:    acetaminophen (TYLENOL) 650 MG CR tablet, Take 650-1,300 mg by mouth every 8 (eight) hours as needed for pain., Disp: , Rfl:    amLODipine (NORVASC) 10 MG tablet, Take 1 tablet (10 mg total) by mouth daily., Disp: 90 tablet, Rfl: 1   apixaban (ELIQUIS) 5 MG TABS tablet, TAKE 1 TABLET BY MOUTH TWICE DAILY, Disp: 60 tablet, Rfl: 5   atorvastatin (LIPITOR) 10 MG tablet, TAKE 1 TABLET BY MOUTH EVERY DAY, Disp: 90 tablet, Rfl: 3   diclofenac sodium (VOLTAREN) 1 % GEL, Apply 2 g topically daily as needed (KNEE PAIN)., Disp: , Rfl:    empagliflozin (JARDIANCE) 10 MG TABS tablet, TAKE 1 TABLET BY MOUTH EVERY MORNING BEFORE  BREAKFAST, Disp: 90 tablet, Rfl: 0   losartan (COZAAR) 100 MG tablet, TAKE 1 TABLET BY MOUTH EVERY DAY, Disp: 90 tablet, Rfl: 1   metoprolol tartrate (LOPRESSOR) 50 MG tablet, TAKE 1 TABLET BY MOUTH 2 TIMES A DAY, Disp: 180 tablet, Rfl: 3   Multiple Vitamin (MULTIVITAMIN) tablet, Take 1 tablet by mouth daily., Disp: , Rfl:    traMADol (ULTRAM) 50 MG tablet, TAKE 1 TABLET BY MOUTH EVERY 12 HOURS ASNEEDED FOR PAIN, Disp: 60 tablet, Rfl: 0   traZODone (DESYREL) 50 MG tablet, 1/2 po q hs prn sleep (Patient not taking: Reported on 07/26/2022), Disp: 30 tablet, Rfl: 1  Current Facility-Administered Medications:    triamcinolone acetonide (KENALOG-40) injection 80 mg, 80 mg, Intra-articular, Once, Mliss Sax, MD   Objective:     BP 136/70 (BP Location: Right Arm, Patient Position: Sitting, Cuff Size: Normal)   Pulse (!) 59   Temp 97.8 F (36.6 C) (Temporal)   Ht 5\' 2"  (1.575 m)   Wt 141 lb (64 kg)   SpO2 95%   BMI 25.79 kg/m    Physical Exam Constitutional:      General: She is not in acute distress.    Appearance: Normal appearance. She is not ill-appearing, toxic-appearing or diaphoretic.  HENT:     Head: Normocephalic and atraumatic.     Right Ear: External ear normal.     Left Ear: External ear normal.  Eyes:  General: No scleral icterus.       Right eye: No discharge.        Left eye: No discharge.     Extraocular Movements: Extraocular movements intact.     Conjunctiva/sclera: Conjunctivae normal.  Cardiovascular:     Rate and Rhythm: Normal rate and regular rhythm. Occasional Extrasystoles are present. Pulmonary:     Effort: Pulmonary effort is normal. No respiratory distress.  Musculoskeletal:     Cervical back: No rigidity or tenderness.  Lymphadenopathy:     Cervical: No cervical adenopathy.  Skin:    General: Skin is warm and dry.  Neurological:     Mental Status: She is alert and oriented to person, place, and time.  Psychiatric:        Mood and  Affect: Mood normal.        Behavior: Behavior normal.      No results found for any visits on 07/26/22.    The ASCVD Risk score (Arnett DK, et al., 2019) failed to calculate for the following reasons:   The 2019 ASCVD risk score is only valid for ages 43 to 60    Assessment & Plan:   Essential hypertension -     Basic metabolic panel -     CBC  Stage 3b chronic kidney disease (HCC) -     Basic metabolic panel  Prediabetes -     Basic metabolic panel -     Hemoglobin A1c  Microcytic anemia -     Iron, TIBC and Ferritin Panel -     CBC  Presbycusis of both ears    Return in about 6 months (around 01/25/2023), or if symptoms worsen or fail to improve.    Mliss Sax, MD

## 2022-07-27 LAB — IRON,TIBC AND FERRITIN PANEL
Ferritin: 71 ng/mL (ref 16–288)
Iron: <10 ug/dL — ABNORMAL LOW (ref 45–160)

## 2022-07-27 MED ORDER — IRON (FERROUS SULFATE) 325 (65 FE) MG PO TABS
ORAL_TABLET | ORAL | 2 refills | Status: AC
Start: 2022-07-27 — End: ?

## 2022-07-27 NOTE — Addendum Note (Signed)
Addended by: Andrez Grime on: 07/27/2022 07:13 AM   Modules accepted: Orders

## 2022-08-11 ENCOUNTER — Telehealth: Payer: Self-pay

## 2022-08-11 NOTE — Telephone Encounter (Signed)
Scheduled AMV for 7/15- Telephone

## 2022-08-30 ENCOUNTER — Ambulatory Visit (INDEPENDENT_AMBULATORY_CARE_PROVIDER_SITE_OTHER): Payer: 59

## 2022-08-30 VITALS — BP 110/70 | HR 64 | Temp 98.0°F | Ht 62.0 in | Wt 139.8 lb

## 2022-08-30 DIAGNOSIS — Z0000 Encounter for general adult medical examination without abnormal findings: Secondary | ICD-10-CM | POA: Diagnosis not present

## 2022-08-30 NOTE — Progress Notes (Signed)
Subjective:   Chelsea Dean is a 89 y.o. female who presents for Medicare Annual (Subsequent) preventive examination.  Visit Complete: in person     Review of Systems     Cardiac Risk Factors include: advanced age (>70men, >44 women);diabetes mellitus;dyslipidemia;hypertension     Objective:    Today's Vitals   08/30/22 1420  BP: 110/70  Pulse: 64  Temp: 98 F (36.7 C)  TempSrc: Oral  SpO2: 95%  Weight: 139 lb 12.8 oz (63.4 kg)  Height: 5\' 2"  (1.575 m)   Body mass index is 25.57 kg/m.     08/30/2022    2:27 PM 09/03/2021    9:23 AM 11/24/2020    4:58 PM 11/24/2020    1:19 PM 11/21/2020    7:17 PM 11/21/2020    7:05 PM 11/17/2020   10:41 AM  Advanced Directives  Does Patient Have a Medical Advance Directive? Yes Yes Yes Yes Yes Yes Yes  Type of Advance Directive Out of facility DNR (pink MOST or yellow form) Healthcare Power of [employer] of [place];Living will Healthcare Power of [employer] of [place];Living will  Healthcare Power of [place];Living will  Does patient want to make changes to medical advance directive?  No - Patient declined No - Patient declined Yes (ED - Information included in AVS) No - Patient declined    Copy of Healthcare Power of Attorney in Chart?  No - copy requested No - copy requested  No - copy requested  No - copy requested    Current Medications (verified) Outpatient Encounter Medications as of 08/30/2022  Medication Sig   acetaminophen (TYLENOL) 650 MG CR tablet Take 650-1,300 mg by mouth every 8 (eight) hours as needed for pain.   amLODipine (NORVASC) 10 MG tablet Take 1 tablet (10 mg total) by mouth daily.   apixaban (ELIQUIS) 5 MG TABS tablet TAKE 1 TABLET BY MOUTH TWICE DAILY   atorvastatin (LIPITOR) 10 MG tablet TAKE 1 TABLET BY MOUTH EVERY DAY   diclofenac sodium (VOLTAREN) 1 % GEL Apply 2 g topically daily as needed (KNEE PAIN).   empagliflozin (JARDIANCE) 10 MG TABS tablet TAKE 1 TABLET BY MOUTH  EVERY MORNING BEFORE BREAKFAST   losartan (COZAAR) 100 MG tablet TAKE 1 TABLET BY MOUTH EVERY DAY   metoprolol tartrate (LOPRESSOR) 50 MG tablet TAKE 1 TABLET BY MOUTH 2 TIMES A DAY   Multiple Vitamin (MULTIVITAMIN) tablet Take 1 tablet by mouth daily.   Iron, Ferrous Sulfate, 325 (65 Fe) MG TABS Take one twice daily by mouth if possible (Patient not taking: Reported on 08/30/2022)   traMADol (ULTRAM) 50 MG tablet TAKE 1 TABLET BY MOUTH EVERY 12 HOURS ASNEEDED FOR PAIN (Patient not taking: Reported on 08/30/2022)   traZODone (DESYREL) 50 MG tablet 1/2 po q hs prn sleep (Patient not taking: Reported on 07/26/2022)   Facility-Administered Encounter Medications as of 08/30/2022  Medication   triamcinolone acetonide (KENALOG-40) injection 80 mg    Allergies (verified) Ace inhibitors, Contrast media [iodinated contrast media], and Nsaids   History: Past Medical History:  Diagnosis Date   A-fib (HCC) 10/19/2016   Abdominal aortic aneurysm (AAA) without rupture (HCC) 09/15/2013   Acute idiopathic gout of right foot    AKI (acute kidney injury) (HCC) 10/18/2016   Anticoagulated on Coumadin    Arthritis    Diabetes mellitus without complication (HCC)    Diverticular disease of large intestine 04/16/2013   Diverticulosis    DM2 (diabetes mellitus, type 2) (HCC) 10/19/2016  Dysrhythmia    Atrial fib   History of colon polyps 04/16/2013   Hx of colonic polyps    Hyperlipidemia    Hypertension    Long term current use of anticoagulant therapy 02/05/2015   Lumbar radiculopathy 03/28/2014   Microalbuminuria 04/16/2013   Osteoarthritis 04/16/2013   Osteoarthritis of cervical spine 03/28/2014   Pelvic mass 10/19/2016   Right renal mass    SBO (small bowel obstruction) (HCC) 10/18/2016   Thrombocytopenia (HCC) 04/16/2013   Overview:  referred to hematology 01/2104   Past Surgical History:  Procedure Laterality Date   ABDOMINAL AORTIC ANEURYSM REPAIR     ABDOMINAL HYSTERECTOMY      APPLICATION OF WOUND VAC Left 11/26/2020   Procedure: APPLICATION OF WOUND VAC;  Surgeon: Leonie Douglas, MD;  Location: [facility] OR;  Service: Vascular;  Laterality: Left;   INCISION AND DRAINAGE Left 11/26/2020   Procedure: INCISION AND DRAINAGE OF LEFT LEG;  Surgeon: Leonie Douglas, MD;  Location: [facility] OR;  Service: Vascular;  Laterality: Left;   IR ANGIOGRAM EXTREMITY LEFT  04/01/2020   IR ANGIOGRAM PELVIS SELECTIVE OR SUPRASELECTIVE  04/01/2020   IR ANGIOGRAM SELECTIVE EACH ADDITIONAL VESSEL  04/01/2020   IR ANGIOGRAM SELECTIVE EACH ADDITIONAL VESSEL  04/01/2020   IR ANGIOGRAM SELECTIVE EACH ADDITIONAL VESSEL  04/01/2020   IR AORTAGRAM ABDOMINAL SERIALOGRAM  04/01/2020   IR EMBO ARTERIAL NOT HEMORR HEMANG INC GUIDE ROADMAPPING  04/01/2020   IR GENERIC HISTORICAL  07/01/2015   IR RADIOLOGIST EVAL & MGMT 07/01/2015 [facility] INTERV RAD   IR RADIOLOGIST EVAL & MGMT  03/11/2020   IR RADIOLOGIST EVAL & MGMT  04/30/2020   IR RADIOLOGIST EVAL & MGMT  06/22/2021   IR THROMBECT PRIM MECH INIT (INCLU) MOD SED  04/01/2020   IR US GUIDE VASC ACCESS LEFT  04/01/2020   IR US GUIDE VASC ACCESS LEFT  04/01/2020   IR US GUIDE VASC ACCESS RIGHT  04/01/2020   THROMBECTOMY FEMORAL ARTERY Left 04/01/2020   Procedure: THROMBECTOMY LEFT POPLITEAL ARTERY;  Surgeon: Larina Earthly, MD;  Location: [facility] OR;  Service: Vascular;  Laterality: Left;   Family History  Problem Relation Age of Onset   Cancer Sister        Rectal and Stomach   Social History   Socioeconomic History   Marital status: Married    Spouse name: Not on file   Number of children: Not on file   Years of education: Not on file   Highest education level: Not on file  Occupational History   Occupation: retired [employer]  Tobacco Use   Smoking status: Never   Smokeless tobacco: Former    Types: Snuff    Quit date: 2018  Vaping Use   Vaping status: Never Used  Substance and Sexual Activity   Alcohol use: No    Alcohol/week: 0.0 standard drinks of  alcohol   Drug use: No   Sexual activity: Not Currently  Other Topics Concern   Not on file  Social History Narrative   Admitted to [facility] 10/27/16   Married   Use snuff   Alcohol none   DNR   Social Determinants of Health   Financial Resource Strain: Low Risk  (08/30/2022)   Overall Financial Resource Strain (CARDIA)    Difficulty of Paying Living Expenses: Not hard at all  Food Insecurity: No Food Insecurity (08/30/2022)   Hunger Vital Sign    Worried About Running Out of Food in the Last  Year: Never true    Ran Out of Food in the Last Year: Never true  Transportation Needs: No Transportation Needs (08/30/2022)   PRAPARE - Administrator, Civil Service (Medical): No    Lack of Transportation (Non-Medical): No  Physical Activity: Inactive (08/30/2022)   Exercise Vital Sign    Days of Exercise per Week: 0 days    Minutes of Exercise per Session: 0 min  Stress: No Stress Concern Present (08/30/2022)   [employer] of Occupational Health - Occupational Stress Questionnaire    Feeling of Stress : Not at all  Social Connections: Moderately Isolated (08/30/2022)   Social Connection and Isolation Panel [NHANES]    Frequency of Communication with Friends and Family: More than three times a week    Frequency of Social Gatherings with Friends and Family: More than three times a week    Attends Religious Services: More than 4 times per year    Active Member of [employer] or Organizations: No    Attends Banker Meetings: Never    Marital Status: Widowed    Tobacco Counseling Counseling given: Not Answered   Clinical Intake:  Pre-visit preparation completed: Yes  Pain : No/denies pain     Nutritional Status: BMI 25 -29 Overweight Nutritional Risks: Nausea/ vomitting/ diarrhea (diarrhea last week has resolved) Diabetes: Yes CBG done?: No Did pt. bring in CBG monitor from home?: No  How often do you need to have someone help you when  you read instructions, pamphlets, or other written materials from your doctor or pharmacy?: 4 - Often  Interpreter Needed?: No  Information entered by :: Chelsea Dean   Activities of Daily Living    08/30/2022    2:22 PM 09/03/2021    9:34 AM  In your present state of health, do you have any difficulty performing the following activities:  Hearing? 1 1  Comment hearing aids that are maintained hearing aids- need fixed  Vision? 0 1  Difficulty concentrating or making decisions? 1 0  Walking or climbing stairs? 1 0  Comment uses a walker and has a ramp   Dressing or bathing? 1 1  Comment needs help with bathing has help with bathing only  Doing errands, shopping? 1 0  Comment daughter accompanies daughter assists  Quarry manager and eating ? N N  Using the Toilet? N N  In the past six months, have you accidently leaked urine? N N  Do you have problems with loss of bowel control? N N  Managing your Medications? Malvin Johns  Comment daughter manages daughter assists  Managing your Finances? Alpha Gula  Comment daughter Museum/gallery conservator or managing your Housekeeping? Malvin Johns  Comment daughter manages daughter assists    Patient Care Team: Mliss Sax, MD as PCP - General (Family Medicine) Gaspar Cola, [facility] (Inactive) as Pharmacist (Pharmacist)  Indicate any recent Medical Services you may have received from other than [facility] providers in the past year (date may be approximate).     Assessment:   This is a routine wellness examination for Chelsea Dean.  Hearing/Vision screen Hearing Screening - Comments:: Has hearing aids that are maintained Vision Screening - Comments:: Regular eye exams, [facility]  Dietary issues and exercise activities discussed:     Goals Addressed             This Visit's Progress    Patient Stated       08/30/2022, remain mobile  Depression Screen    08/30/2022    2:29 PM 07/26/2022    1:17 PM 01/20/2022    9:54 AM 10/21/2021    10:53 AM 09/03/2021    9:39 AM 08/06/2021    9:06 AM 03/23/2021    9:53 AM  PHQ 2/9 Scores  PHQ - 2 Score 0 0 0 0 0 0 0  PHQ- 9 Score 0          Fall Risk    08/30/2022    2:28 PM 07/26/2022    1:17 PM 01/20/2022    9:55 AM 10/21/2021   10:53 AM 09/03/2021    9:24 AM  Fall Risk   Falls in the past year? 1 0 0 0 0  Comment foot got caught      Number falls in past yr: 0 1 0 0 0  Injury with Fall? 0 0 0  0  Risk for fall due to : Medication side effect;Impaired mobility;Impaired balance/gait History of fall(s) No Fall Risks  History of fall(s)  Follow up Falls evaluation completed;Falls prevention discussed Falls evaluation completed Falls evaluation completed  Falls evaluation completed    MEDICARE RISK AT HOME:  Medicare Risk at Home - 08/30/22 1429     Any stairs in or around the home? Yes   has a ramp   If so, are there any without handrails? No   has a ramp   Home free of loose throw rugs in walkways, pet beds, electrical cords, etc? Yes    Adequate lighting in your home to reduce risk of falls? Yes    Life alert? No    Use of a cane, walker or w/c? Yes    Grab bars in the bathroom? Yes    Shower chair or bench in shower? Yes    Elevated toilet seat or a handicapped toilet? Yes             TIMED UP AND GO:  Was the test performed?  Yes  Length of time to ambulate 10 feet: 7 sec Gait slow and steady with assistive device    Cognitive Function:    05/09/2019    2:43 PM  MMSE - Mini Mental State Exam  Not completed: Refused;Unable to complete        08/30/2022    2:30 PM 09/03/2021    9:25 AM  6CIT Screen  What Year? 4 points 0 points  What month? 0 points 0 points  What time? 0 points 0 points  Count back from 20 4 points 0 points  Months in reverse 4 points 0 points  Repeat phrase 10 points 0 points  Total Score 22 points 0 points    Immunizations Immunization History  Administered Date(s) Administered   Fluad Quad(high Dose 65+) 12/05/2018, 12/05/2019,  11/24/2020   Influenza, High Dose Seasonal PF 01/17/2014, 10/23/2014, 12/28/2017, 01/20/2022   Influenza,inj,Quad PF,6+ Mos 11/21/2015   Influenza-Unspecified 01/02/2013, 02/23/2013, 01/17/2014, 10/23/2014   Moderna Sars-Covid-2 Vaccination 04/17/2019, 05/15/2019   PPD Test 10/27/2016   Pneumococcal Conjugate-13 10/23/2014   Pneumococcal Polysaccharide-23 08/29/2007   RSV,unspecified 02/11/2022   Zoster Recombinant(Shingrix) 08/06/2021, 01/20/2022    TDAP status: Due, Education has been provided regarding the importance of this vaccine. Advised may receive this vaccine at local pharmacy or Health Dept. Aware to provide a copy of the vaccination record if obtained from local pharmacy or Health Dept. Verbalized acceptance and understanding.  Flu Vaccine status: Up to date  Pneumococcal vaccine status: Up to date  Covid-19  vaccine status: Information provided on how to obtain vaccines.   Qualifies for Shingles Vaccine? Yes   Zostavax completed Yes   Shingrix Completed?: Yes  Screening Tests Health Maintenance  Topic Date Due   DEXA SCAN  Never done   FOOT EXAM  06/06/2021   OPHTHALMOLOGY EXAM  08/20/2022   Medicare Annual Wellness (AWV)  09/04/2022   INFLUENZA VACCINE  09/16/2022   HEMOGLOBIN A1C  01/25/2023   Pneumonia Vaccine 84+ Years old  Completed   HPV VACCINES  Aged Out   DTaP/Tdap/Td  Discontinued   COVID-19 Vaccine  Discontinued   Zoster Vaccines- Shingrix  Discontinued    Health Maintenance  Health Maintenance Due  Topic Date Due   DEXA SCAN  Never done   FOOT EXAM  06/06/2021   OPHTHALMOLOGY EXAM  08/20/2022   Medicare Annual Wellness (AWV)  09/04/2022    Colorectal cancer screening: No longer required.   Mammogram status: No longer required due to age.  Bone Density status: declines  Lung Cancer Screening: (Low Dose CT Chest recommended if Age 30-80 years, 20 pack-year currently smoking OR have quit w/in 15years.) does not qualify.   Lung Cancer  Screening Referral: no  Additional Screening:  Hepatitis C Screening: does not qualify;   Vision Screening: Recommended annual ophthalmology exams for early detection of glaucoma and other disorders of the eye. Is the patient up to date with their annual eye exam?  Yes  Who is the provider or what is the name of the office in which the patient attends annual eye exams? [facility] If pt is not established with a provider, would they like to be referred to a provider to establish care? No .   Dental Screening: Recommended annual dental exams for proper oral hygiene  Diabetic Foot Exam: Diabetic Foot Exam: Overdue, Pt has been advised about the importance in completing this exam. Pt is scheduled for diabetic foot exam on next appointment.  Community Resource Referral / Chronic Care Management: CRR required this visit?  No   CCM required this visit?  No     Plan:     I have personally reviewed and noted the following in the patient's chart:   Medical and social history Use of alcohol, tobacco or illicit drugs  Current medications and supplements including opioid prescriptions. Patient is not currently taking opioid prescriptions. Functional ability and status Nutritional status Physical activity Advanced directives List of other physicians Hospitalizations, surgeries, and ER visits in previous 12 months Vitals Screenings to include cognitive, depression, and falls Referrals and appointments  In addition, I have reviewed and discussed with patient certain preventive protocols, quality metrics, and best practice recommendations. A written personalized care plan for preventive services as well as general preventive health recommendations were provided to patient.     Barb Merino, Dean   1/61/0960   After Visit Summary: in person  Nurse Notes: 6 CIT is 22 from a zero last year. Daughter states she has been having trouble with memory.

## 2022-08-30 NOTE — Patient Instructions (Signed)
Chelsea Dean , Thank you for taking time to come for your Medicare Wellness Visit. I appreciate your ongoing commitment to your health goals. Please review the following plan we discussed and let me know if I can assist you in the future.   These are the goals we discussed:  Goals      Maintain independence as much as possible.     Monitor and Manage My Blood Sugar-Diabetes Type 2     Timeframe:  Long-Range Goal Priority:  High Start Date:  05/06/2020                           Expected End Date:  11/07/2022                     Follow Up within 90 days   - check blood sugar at prescribed times - check blood sugar if I feel it is too high or too low - enter blood sugar readings and medication or insulin into daily log    Why is this important?   Checking your blood sugar at home helps to keep it from getting very high or very low.  Writing the results in a diary or log helps the doctor know how to care for you.  Your blood sugar log should have the time, date and the results.  Also, write down the amount of insulin or other medicine that you take.  Other information, like what you ate, exercise done and how you were feeling, will also be helpful.     Notes:      Patient Stated     Continue to maintain current lifestyle     Patient Stated     08/30/2022, remain mobile        This is a list of the screening recommended for you and due dates:  Health Maintenance  Topic Date Due   DEXA scan (bone density measurement)  Never done   Complete foot exam   06/06/2021   Eye exam for diabetics  08/20/2022   Medicare Annual Wellness Visit  09/04/2022   Flu Shot  09/16/2022   Hemoglobin A1C  01/25/2023   Pneumonia Vaccine  Completed   HPV Vaccine  Aged Out   DTaP/Tdap/Td vaccine  Discontinued   COVID-19 Vaccine  Discontinued   Zoster (Shingles) Vaccine  Discontinued    Advanced directives: Please bring a copy of your POA (Power of Attorney) and/or Living Will to your next  appointment.   Conditions/risks identified: none  Next appointment: Follow up in one year for your annual wellness visit    Preventive Care 65 Years and Older, Female Preventive care refers to lifestyle choices and visits with your health care provider that can promote health and wellness. What does preventive care include? A yearly physical exam. This is also called an annual well check. Dental exams once or twice a year. Routine eye exams. Ask your health care provider how often you should have your eyes checked. Personal lifestyle choices, including: Daily care of your teeth and gums. Regular physical activity. Eating a healthy diet. Avoiding tobacco and drug use. Limiting alcohol use. Practicing safe sex. Taking low-dose aspirin every day. Taking vitamin and mineral supplements as recommended by your health care provider. What happens during an annual well check? The services and screenings done by your health care provider during your annual well check will depend on your age, overall health, lifestyle risk factors, and family  history of disease. Counseling  Your health care provider may ask you questions about your: Alcohol use. Tobacco use. Drug use. Emotional well-being. Home and relationship well-being. Sexual activity. Eating habits. History of falls. Memory and ability to understand (cognition). Work and work Astronomer. Reproductive health. Screening  You may have the following tests or measurements: Height, weight, and BMI. Blood pressure. Lipid and cholesterol levels. These may be checked every 5 years, or more frequently if you are over 2 years old. Skin check. Lung cancer screening. You may have this screening every year starting at age 85 if you have a 30-pack-year history of smoking and currently smoke or have quit within the past 15 years. Fecal occult blood test (FOBT) of the stool. You may have this test every year starting at age 30. Flexible  sigmoidoscopy or colonoscopy. You may have a sigmoidoscopy every 5 years or a colonoscopy every 10 years starting at age 62. Hepatitis C blood test. Hepatitis B blood test. Sexually transmitted disease (STD) testing. Diabetes screening. This is done by checking your blood sugar (glucose) after you have not eaten for a while (fasting). You may have this done every 1-3 years. Bone density scan. This is done to screen for osteoporosis. You may have this done starting at age 69. Mammogram. This may be done every 1-2 years. Talk to your health care provider about how often you should have regular mammograms. Talk with your health care provider about your test results, treatment options, and if necessary, the need for more tests. Vaccines  Your health care provider may recommend certain vaccines, such as: Influenza vaccine. This is recommended every year. Tetanus, diphtheria, and acellular pertussis (Tdap, Td) vaccine. You may need a Td booster every 10 years. Zoster vaccine. You may need this after age 36. Pneumococcal 13-valent conjugate (PCV13) vaccine. One dose is recommended after age 29. Pneumococcal polysaccharide (PPSV23) vaccine. One dose is recommended after age 69. Talk to your health care provider about which screenings and vaccines you need and how often you need them. This information is not intended to replace advice given to you by your health care provider. Make sure you discuss any questions you have with your health care provider. Document Released: 02/28/2015 Document Revised: 10/22/2015 Document Reviewed: 12/03/2014 Elsevier Interactive Patient Education  2017 [employer].  Fall Prevention in the Home Falls can cause injuries. They can happen to people of all ages. There are many things you can do to make your home safe and to help prevent falls. What can I do on the outside of my home? Regularly fix the edges of walkways and driveways and fix any cracks. Remove anything that  might make you trip as you walk through a door, such as a raised step or threshold. Trim any bushes or trees on the path to your home. Use bright outdoor lighting. Clear any walking paths of anything that might make someone trip, such as rocks or tools. Regularly check to see if handrails are loose or broken. Make sure that both sides of any steps have handrails. Any raised decks and porches should have guardrails on the edges. Have any leaves, snow, or ice cleared regularly. Use sand or salt on walking paths during winter. Clean up any spills in your garage right away. This includes oil or grease spills. What can I do in the bathroom? Use night lights. Install grab bars by the toilet and in the tub and shower. Do not use towel bars as grab bars. Use non-skid mats  or decals in the tub or shower. If you need to sit down in the shower, use a plastic, non-slip stool. Keep the floor dry. Clean up any water that spills on the floor as soon as it happens. Remove soap buildup in the tub or shower regularly. Attach bath mats securely with double-sided non-slip rug tape. Do not have throw rugs and other things on the floor that can make you trip. What can I do in the bedroom? Use night lights. Make sure that you have a light by your bed that is easy to reach. Do not use any sheets or blankets that are too big for your bed. They should not hang down onto the floor. Have a firm chair that has side arms. You can use this for support while you get dressed. Do not have throw rugs and other things on the floor that can make you trip. What can I do in the kitchen? Clean up any spills right away. Avoid walking on wet floors. Keep items that you use a lot in easy-to-reach places. If you need to reach something above you, use a strong step stool that has a grab bar. Keep electrical cords out of the way. Do not use floor polish or wax that makes floors slippery. If you must use wax, use non-skid floor  wax. Do not have throw rugs and other things on the floor that can make you trip. What can I do with my stairs? Do not leave any items on the stairs. Make sure that there are handrails on both sides of the stairs and use them. Fix handrails that are broken or loose. Make sure that handrails are as long as the stairways. Check any carpeting to make sure that it is firmly attached to the stairs. Fix any carpet that is loose or worn. Avoid having throw rugs at the top or bottom of the stairs. If you do have throw rugs, attach them to the floor with carpet tape. Make sure that you have a light switch at the top of the stairs and the bottom of the stairs. If you do not have them, ask someone to add them for you. What else can I do to help prevent falls? Wear shoes that: Do not have high heels. Have rubber bottoms. Are comfortable and fit you well. Are closed at the toe. Do not wear sandals. If you use a stepladder: Make sure that it is fully opened. Do not climb a closed stepladder. Make sure that both sides of the stepladder are locked into place. Ask someone to hold it for you, if possible. Clearly mark and make sure that you can see: Any grab bars or handrails. First and last steps. Where the edge of each step is. Use tools that help you move around (mobility aids) if they are needed. These include: Canes. Walkers. Scooters. Crutches. Turn on the lights when you go into a dark area. Replace any light bulbs as soon as they burn out. Set up your furniture so you have a clear path. Avoid moving your furniture around. If any of your floors are uneven, fix them. If there are any pets around you, be aware of where they are. Review your medicines with your doctor. Some medicines can make you feel dizzy. This can increase your chance of falling. Ask your doctor what other things that you can do to help prevent falls. This information is not intended to replace advice given to you by your  health care provider. Make  sure you discuss any questions you have with your health care provider. Document Released: 11/28/2008 Document Revised: 07/10/2015 Document Reviewed: 03/08/2014 Elsevier Interactive Patient Education  2017 [employer].

## 2022-09-20 ENCOUNTER — Other Ambulatory Visit: Payer: Self-pay | Admitting: Family Medicine

## 2022-09-20 DIAGNOSIS — I4891 Unspecified atrial fibrillation: Secondary | ICD-10-CM

## 2022-09-20 DIAGNOSIS — I10 Essential (primary) hypertension: Secondary | ICD-10-CM

## 2022-09-20 DIAGNOSIS — N1831 Chronic kidney disease, stage 3a: Secondary | ICD-10-CM

## 2022-09-20 DIAGNOSIS — E119 Type 2 diabetes mellitus without complications: Secondary | ICD-10-CM

## 2022-10-06 ENCOUNTER — Ambulatory Visit (INDEPENDENT_AMBULATORY_CARE_PROVIDER_SITE_OTHER): Payer: 59 | Admitting: Family Medicine

## 2022-10-06 ENCOUNTER — Telehealth: Payer: Self-pay | Admitting: Family Medicine

## 2022-10-06 ENCOUNTER — Encounter: Payer: Self-pay | Admitting: Family Medicine

## 2022-10-06 VITALS — BP 118/84 | HR 60 | Temp 97.2°F | Ht 62.0 in | Wt 138.0 lb

## 2022-10-06 DIAGNOSIS — N1832 Chronic kidney disease, stage 3b: Secondary | ICD-10-CM | POA: Diagnosis not present

## 2022-10-06 DIAGNOSIS — M25562 Pain in left knee: Secondary | ICD-10-CM | POA: Diagnosis not present

## 2022-10-06 DIAGNOSIS — E611 Iron deficiency: Secondary | ICD-10-CM | POA: Diagnosis not present

## 2022-10-06 DIAGNOSIS — D509 Iron deficiency anemia, unspecified: Secondary | ICD-10-CM

## 2022-10-06 DIAGNOSIS — H6122 Impacted cerumen, left ear: Secondary | ICD-10-CM | POA: Diagnosis not present

## 2022-10-06 DIAGNOSIS — E7800 Pure hypercholesterolemia, unspecified: Secondary | ICD-10-CM | POA: Diagnosis not present

## 2022-10-06 DIAGNOSIS — B079 Viral wart, unspecified: Secondary | ICD-10-CM

## 2022-10-06 DIAGNOSIS — G8929 Other chronic pain: Secondary | ICD-10-CM | POA: Diagnosis not present

## 2022-10-06 DIAGNOSIS — M25561 Pain in right knee: Secondary | ICD-10-CM

## 2022-10-06 DIAGNOSIS — I10 Essential (primary) hypertension: Secondary | ICD-10-CM

## 2022-10-06 LAB — COMPREHENSIVE METABOLIC PANEL
ALT: 7 U/L (ref 0–35)
AST: 12 U/L (ref 0–37)
Albumin: 4.2 g/dL (ref 3.5–5.2)
Alkaline Phosphatase: 57 U/L (ref 39–117)
BUN: 20 mg/dL (ref 6–23)
CO2: 30 mEq/L (ref 19–32)
Calcium: 9.6 mg/dL (ref 8.4–10.5)
Chloride: 103 mEq/L (ref 96–112)
Creatinine, Ser: 1.12 mg/dL (ref 0.40–1.20)
GFR: 42.42 mL/min — ABNORMAL LOW (ref >60.00)
Glucose, Bld: 129 mg/dL — ABNORMAL HIGH (ref 70–99)
Potassium: 3 mEq/L — ABNORMAL LOW (ref 3.5–5.1)
Sodium: 143 mEq/L (ref 135–145)
Total Bilirubin: 0.7 mg/dL (ref 0.2–1.2)
Total Protein: 7.1 g/dL (ref 6.0–8.3)

## 2022-10-06 LAB — LIPID PANEL
Cholesterol: 126 mg/dL (ref 0–200)
HDL: 33.9 mg/dL — ABNORMAL LOW (ref >39.00)
LDL Cholesterol: 70 mg/dL (ref 0–99)
NonHDL: 91.93
Total CHOL/HDL Ratio: 4
Triglycerides: 108 mg/dL (ref 0.0–149.0)
VLDL: 21.6 mg/dL (ref 0.0–40.0)

## 2022-10-06 LAB — CBC WITH DIFFERENTIAL/PLATELET
Basophils Absolute: 0 10*3/uL (ref 0.0–0.1)
Basophils Relative: 0.5 % (ref 0.0–3.0)
Eosinophils Absolute: 0.2 10*3/uL (ref 0.0–0.7)
Eosinophils Relative: 2.8 % (ref 0.0–5.0)
HCT: 37.5 % (ref 36.0–46.0)
Hemoglobin: 11.6 g/dL — ABNORMAL LOW (ref 12.0–15.0)
Lymphocytes Relative: 14.1 % (ref 12.0–46.0)
Lymphs Abs: 0.8 10*3/uL (ref 0.7–4.0)
MCHC: 31 g/dL (ref 30.0–36.0)
MCV: 79.5 fl (ref 78.0–100.0)
Monocytes Absolute: 0.7 10*3/uL (ref 0.1–1.0)
Monocytes Relative: 12.5 % — ABNORMAL HIGH (ref 3.0–12.0)
Neutro Abs: 3.8 10*3/uL (ref 1.4–7.7)
Neutrophils Relative %: 70.1 % (ref 43.0–77.0)
Platelets: 121 10*3/uL — ABNORMAL LOW (ref 150.0–400.0)
RBC: 4.71 Mil/uL (ref 3.87–5.11)
RDW: 17.7 % — ABNORMAL HIGH (ref 11.5–15.5)
WBC: 5.5 10*3/uL (ref 4.0–10.5)

## 2022-10-06 NOTE — Telephone Encounter (Signed)
Pt has a [facility] appt 10/13/22 for knee injection.

## 2022-10-06 NOTE — Telephone Encounter (Signed)
This needs to be an OV. Pt has an OV scheduled for 10/06/22. Pt can have knee inj done at that time. If PCP requests a future appt be scheduled to perform knee inj, then a subsequent OV can be scheduled at that time.

## 2022-10-06 NOTE — Progress Notes (Signed)
Established Patient Office Visit   Subjective:  Patient ID: Chelsea Dean, female    DOB: [birth year]  Age: 89 y.o. MRN: [id number]  Chief Complaint  Patient presents with   Medical Management of Chronic Issues    3 month follow up. Pt complains of right leg and knee pain.  Pt requesting a referral to a [facility] for right facial mole.     HPI Encounter Diagnoses  Name Primary?   Essential hypertension Yes   Stage 3b chronic kidney disease (HCC)    Microcytic anemia    Iron deficiency    Elevated LDL cholesterol level    Chronic pain of both knees    Filiform wart    For follow-up of above.  Continues all medications as directed.  Unable to tolerate iron.  Ongoing pain and swelling in her knees.  Right greater than left.  Concerned about a growth on the right side of her face that seems to be changing.   Review of Systems  Constitutional: Negative.   HENT: Negative.    Eyes:  Negative for blurred vision, discharge and redness.  Respiratory: Negative.    Cardiovascular: Negative.   Gastrointestinal:  Negative for abdominal pain.  Genitourinary: Negative.   Musculoskeletal:  Positive for joint pain. Negative for myalgias.  Skin:  Negative for rash.  Neurological:  Negative for tingling, loss of consciousness and weakness.  Endo/Heme/Allergies:  Negative for polydipsia.     Current Outpatient Medications:    acetaminophen (TYLENOL) 650 MG CR tablet, Take 650-1,300 mg by mouth every 8 (eight) hours as needed for pain., Disp: , Rfl:    amLODipine (NORVASC) 10 MG tablet, Take 1 tablet (10 mg total) by mouth daily., Disp: 90 tablet, Rfl: 1   apixaban (ELIQUIS) 5 MG TABS tablet, TAKE 1 TABLET BY MOUTH TWICE DAILY, Disp: 60 tablet, Rfl: 5   atorvastatin (LIPITOR) 10 MG tablet, TAKE 1 TABLET BY MOUTH EVERY DAY, Disp: 90 tablet, Rfl: 3   diclofenac sodium (VOLTAREN) 1 % GEL, Apply 2 g topically daily as needed (KNEE PAIN)., Disp: , Rfl:    Iron, Ferrous Sulfate,  325 (65 Fe) MG TABS, Take one twice daily by mouth if possible (Patient not taking: Reported on 08/30/2022), Disp: 60 tablet, Rfl: 2   JARDIANCE 10 MG TABS tablet, TAKE 1 TABLET BY MOUTH EVERY MORNING BEFORE BREAKFAST, Disp: 90 tablet, Rfl: 0   losartan (COZAAR) 100 MG tablet, TAKE 1 TABLET BY MOUTH EVERY DAY, Disp: 90 tablet, Rfl: 1   metoprolol tartrate (LOPRESSOR) 50 MG tablet, TAKE 1 TABLET BY MOUTH 2 TIMES A DAY, Disp: 180 tablet, Rfl: 3   Multiple Vitamin (MULTIVITAMIN) tablet, Take 1 tablet by mouth daily., Disp: , Rfl:    traMADol (ULTRAM) 50 MG tablet, TAKE 1 TABLET BY MOUTH EVERY 12 HOURS ASNEEDED FOR PAIN (Patient not taking: Reported on 08/30/2022), Disp: 60 tablet, Rfl: 0   traZODone (DESYREL) 50 MG tablet, 1/2 po q hs prn sleep (Patient not taking: Reported on 07/26/2022), Disp: 30 tablet, Rfl: 1  Current Facility-Administered Medications:    triamcinolone acetonide (KENALOG-40) injection 80 mg, 80 mg, Intra-articular, Once, Mliss Sax, MD   Objective:     BP 118/84   Pulse 60   Temp (!) 97.2 F (36.2 C)   Ht 5\' 2"  (1.575 m)   Wt 138 lb (62.6 kg)   SpO2 95%   BMI 25.24 kg/m    Physical Exam Constitutional:      General: She is  not in acute distress.    Appearance: Normal appearance. She is not ill-appearing, toxic-appearing or diaphoretic.  HENT:     Head: Normocephalic and atraumatic.     Right Ear: External ear normal.     Left Ear: External ear normal.     Mouth/Throat:     Mouth: Mucous membranes are moist.     Pharynx: Oropharynx is clear. No oropharyngeal exudate or posterior oropharyngeal erythema.  Eyes:     General: No scleral icterus.       Right eye: No discharge.        Left eye: No discharge.     Extraocular Movements: Extraocular movements intact.     Conjunctiva/sclera: Conjunctivae normal.     Pupils: Pupils are equal, round, and reactive to light.  Cardiovascular:     Rate and Rhythm: Normal rate and regular rhythm. Occasional  Extrasystoles are present. Pulmonary:     Effort: Pulmonary effort is normal. No respiratory distress.     Breath sounds: Normal breath sounds.  Abdominal:     General: Bowel sounds are normal.     Palpations: There is no mass.     Tenderness: There is no abdominal tenderness. There is no guarding.  Musculoskeletal:     Cervical back: No rigidity or tenderness.     Right knee: Swelling present. No erythema. Decreased range of motion. Tenderness present over the medial joint line.  Skin:    General: Skin is warm and dry.       Neurological:     Mental Status: She is alert and oriented to person, place, and time.  Psychiatric:        Mood and Affect: Mood normal.        Behavior: Behavior normal.      No results found for any visits on 10/06/22.    The ASCVD Risk score (Arnett DK, et al., 2019) failed to calculate for the following reasons:   The 2019 ASCVD risk score is only valid for ages 31 to 76    Assessment & Plan:   Essential hypertension -     Comprehensive metabolic panel  Stage 3b chronic kidney disease (HCC) -     Comprehensive metabolic panel  Microcytic anemia -     CBC with Differential/Platelet  Iron deficiency -     Iron, TIBC and Ferritin Panel -     CBC with Differential/Platelet -     Ambulatory referral to Hematology / Oncology  Elevated LDL cholesterol level -     Lipid panel  Chronic pain of both knees  Filiform wart -     Ambulatory referral to Dermatology    Return in about 3 months (around 01/06/2023), or Return for knee injection.Mliss Sax, MD

## 2022-10-07 LAB — IRON,TIBC AND FERRITIN PANEL
%SAT: 17 % (ref 16–45)
Ferritin: 90 ng/mL (ref 16–288)
Iron: 54 ug/dL (ref 45–160)
TIBC: 318 ug/dL (ref 250–450)

## 2022-10-08 NOTE — Telephone Encounter (Signed)
10/08/22 - pt has been rescheduled for an OV for 10/12/22 for her knee injection.

## 2022-10-12 ENCOUNTER — Ambulatory Visit (INDEPENDENT_AMBULATORY_CARE_PROVIDER_SITE_OTHER): Payer: 59 | Admitting: Family Medicine

## 2022-10-12 ENCOUNTER — Encounter: Payer: Self-pay | Admitting: Family Medicine

## 2022-10-12 VITALS — BP 124/84 | HR 63 | Temp 97.3°F | Ht 62.0 in | Wt 138.8 lb

## 2022-10-12 DIAGNOSIS — M1711 Unilateral primary osteoarthritis, right knee: Secondary | ICD-10-CM | POA: Diagnosis not present

## 2022-10-12 DIAGNOSIS — E611 Iron deficiency: Secondary | ICD-10-CM | POA: Diagnosis not present

## 2022-10-12 MED ORDER — TRIAMCINOLONE ACETONIDE 40 MG/ML IJ SUSP
40.0000 mg | Freq: Once | INTRAMUSCULAR | Status: AC
Start: 2022-10-12 — End: 2022-10-12
  Administered 2022-10-12: 40 mg via INTRA_ARTICULAR

## 2022-10-12 NOTE — Progress Notes (Signed)
Established Patient Office Visit   Subjective:  Patient ID: Chelsea Dean, female    DOB: 08-Jan-1928  Age: 89 y.o. MRN: [id number]  No chief complaint on file.   HPI Encounter Diagnoses  Name Primary?   Primary osteoarthritis of right knee Yes   Iron deficiency   Longstanding history of severe osteoarthritis of both knees.  Requests traction today.  These tend to help her for several months.  She tolerates them well.  Iron levels came up greater than expected.    Review of Systems  Constitutional: Negative.   HENT: Negative.    Eyes:  Negative for blurred vision, discharge and redness.  Respiratory: Negative.    Cardiovascular: Negative.   Gastrointestinal:  Negative for abdominal pain.  Genitourinary: Negative.   Musculoskeletal:  Positive for joint pain. Negative for myalgias.  Skin:  Negative for rash.  Neurological:  Negative for tingling, loss of consciousness and weakness.  Endo/Heme/Allergies:  Negative for polydipsia.     Current Outpatient Medications:    acetaminophen (TYLENOL) 650 MG CR tablet, Take 650-1,300 mg by mouth every 8 (eight) hours as needed for pain., Disp: , Rfl:    amLODipine (NORVASC) 10 MG tablet, Take 1 tablet (10 mg total) by mouth daily., Disp: 90 tablet, Rfl: 1   apixaban (ELIQUIS) 5 MG TABS tablet, TAKE 1 TABLET BY MOUTH TWICE DAILY, Disp: 60 tablet, Rfl: 5   atorvastatin (LIPITOR) 10 MG tablet, TAKE 1 TABLET BY MOUTH EVERY DAY, Disp: 90 tablet, Rfl: 3   diclofenac sodium (VOLTAREN) 1 % GEL, Apply 2 g topically daily as needed (KNEE PAIN)., Disp: , Rfl:    Iron, Ferrous Sulfate, 325 (65 Fe) MG TABS, Take one twice daily by mouth if possible (Patient not taking: Reported on 08/30/2022), Disp: 60 tablet, Rfl: 2   JARDIANCE 10 MG TABS tablet, TAKE 1 TABLET BY MOUTH EVERY MORNING BEFORE BREAKFAST, Disp: 90 tablet, Rfl: 0   losartan (COZAAR) 100 MG tablet, TAKE 1 TABLET BY MOUTH EVERY DAY, Disp: 90 tablet, Rfl: 1   metoprolol tartrate (LOPRESSOR)  50 MG tablet, TAKE 1 TABLET BY MOUTH 2 TIMES A DAY, Disp: 180 tablet, Rfl: 3   Multiple Vitamin (MULTIVITAMIN) tablet, Take 1 tablet by mouth daily., Disp: , Rfl:    traMADol (ULTRAM) 50 MG tablet, TAKE 1 TABLET BY MOUTH EVERY 12 HOURS ASNEEDED FOR PAIN (Patient not taking: Reported on 08/30/2022), Disp: 60 tablet, Rfl: 0   traZODone (DESYREL) 50 MG tablet, 1/2 po q hs prn sleep (Patient not taking: Reported on 07/26/2022), Disp: 30 tablet, Rfl: 1  Current Facility-Administered Medications:    triamcinolone acetonide (KENALOG-40) injection 40 mg, 40 mg, Intra-articular, Once,    Objective:     BP 124/84   Pulse 63   Temp (!) 97.3 F (36.3 C)   Ht 5\' 2"  (1.575 m)   Wt 138 lb 12.8 oz (63 kg)   SpO2 94%   BMI 25.39 kg/m    Physical Exam Constitutional:      General: She is not in acute distress.    Appearance: Normal appearance. She is not ill-appearing, toxic-appearing or diaphoretic.  HENT:     Head: Normocephalic and atraumatic.     Right Ear: External ear normal.     Left Ear: External ear normal.  Eyes:     General: No scleral icterus.       Right eye: No discharge.        Left eye: No discharge.     Extraocular Movements:  Extraocular movements intact.     Conjunctiva/sclera: Conjunctivae normal.  Pulmonary:     Effort: Pulmonary effort is normal. No respiratory distress.  Musculoskeletal:     Right knee: No swelling, deformity, effusion or erythema. Tenderness present over the medial joint line.  Skin:    General: Skin is warm and dry.  Neurological:     Mental Status: She is alert and oriented to person, place, and time.  Psychiatric:        Mood and Affect: Mood normal.        Behavior: Behavior normal.    Aspiration/Injection Procedure Note Dedre Shorts [id number] [birth year]  Procedure: Injection Indications: pain   Procedure Details Consent: Risks of procedure as well as the alternatives and risks of each were explained to the (patient/caregiver).   Consent for procedure obtained. Time Out: Verified patient identification, verified procedure, site/side was marked, verified correct patient position, special equipment/implants available, medications/allergies/relevent history reviewed, required imaging and test results available.  Performed   Local Anesthesia Used:Lidocaine 1% plain; 1mL Amount of Fluid Aspirated:  none Character of Fluid:  NA Fluid was sent for: NA A sterile dressing was applied. Medial superior patella femur space was identified and marked.  Cleansed with Betadine x 3.  1 cc of lidocaine 1% was used for local anesthesia.  Joint was injected with 1 cc each of Marcaine 0.5, lidocaine 2% and Kenalog 40. Patient did tolerate procedure well. Estimated blood loss: 0cc  Mliss Sax 10/12/2022, 2:56 PM   No results found for any visits on 10/12/22.    The ASCVD Risk score (Arnett DK, et al., 2019) failed to calculate for the following reasons:   The 2019 ASCVD risk score is only valid for ages 34 to 59    Assessment & Plan:   Primary osteoarthritis of right knee -     Triamcinolone Acetonide  Iron deficiency    Return if symptoms worsen or fail to improve.  Okay to cancel referral to hematology.  Patient will take iron sulfate 325 every other day.  Will expect erythema and discomfort tomorrow.  That should clear within the day.  If not, return to clinic.   Mliss Sax, MD

## 2022-10-13 ENCOUNTER — Ambulatory Visit: Payer: 59

## 2022-10-15 ENCOUNTER — Other Ambulatory Visit: Payer: Self-pay | Admitting: Family

## 2022-10-15 DIAGNOSIS — D649 Anemia, unspecified: Secondary | ICD-10-CM

## 2022-10-19 ENCOUNTER — Inpatient Hospital Stay: Payer: 59 | Attending: Family Medicine

## 2022-10-19 ENCOUNTER — Inpatient Hospital Stay: Payer: 59 | Admitting: Family

## 2022-10-20 ENCOUNTER — Other Ambulatory Visit: Payer: Self-pay | Admitting: Family Medicine

## 2022-10-20 DIAGNOSIS — I10 Essential (primary) hypertension: Secondary | ICD-10-CM

## 2022-10-26 ENCOUNTER — Other Ambulatory Visit: Payer: Self-pay | Admitting: Family Medicine

## 2022-10-26 DIAGNOSIS — I4891 Unspecified atrial fibrillation: Secondary | ICD-10-CM

## 2023-01-06 ENCOUNTER — Ambulatory Visit: Payer: 59 | Admitting: Family Medicine

## 2023-01-06 ENCOUNTER — Encounter: Payer: Self-pay | Admitting: Family Medicine

## 2023-01-06 VITALS — BP 124/68 | HR 69 | Temp 98.1°F | Ht 62.0 in | Wt 142.0 lb

## 2023-01-06 DIAGNOSIS — Z23 Encounter for immunization: Secondary | ICD-10-CM | POA: Diagnosis not present

## 2023-01-06 DIAGNOSIS — E611 Iron deficiency: Secondary | ICD-10-CM | POA: Diagnosis not present

## 2023-01-06 DIAGNOSIS — E876 Hypokalemia: Secondary | ICD-10-CM

## 2023-01-06 DIAGNOSIS — E538 Deficiency of other specified B group vitamins: Secondary | ICD-10-CM

## 2023-01-06 DIAGNOSIS — D509 Iron deficiency anemia, unspecified: Secondary | ICD-10-CM

## 2023-01-06 LAB — BASIC METABOLIC PANEL
BUN: 16 mg/dL (ref 6–23)
CO2: 28 meq/L (ref 19–32)
Calcium: 9.8 mg/dL (ref 8.4–10.5)
Chloride: 101 meq/L (ref 96–112)
Creatinine, Ser: 1.28 mg/dL — ABNORMAL HIGH (ref 0.40–1.20)
GFR: 36.08 mL/min — ABNORMAL LOW (ref >60.00)
Glucose, Bld: 163 mg/dL — ABNORMAL HIGH (ref 70–99)
Potassium: 2.7 meq/L — CL (ref 3.5–5.1)
Sodium: 141 meq/L (ref 135–145)

## 2023-01-06 LAB — CBC
HCT: 37.5 % (ref 36.0–46.0)
Hemoglobin: 11.6 g/dL — ABNORMAL LOW (ref 12.0–15.0)
MCHC: 31 g/dL (ref 30.0–36.0)
MCV: 81.8 fL (ref 78.0–100.0)
Platelets: 99 10*3/uL — ABNORMAL LOW (ref 150.0–400.0)
RBC: 4.59 Mil/uL (ref 3.87–5.11)
RDW: 17.4 % — ABNORMAL HIGH (ref 11.5–15.5)
WBC: 5.4 10*3/uL (ref 4.0–10.5)

## 2023-01-06 LAB — VITAMIN B12: Vitamin B-12: 242 pg/mL (ref 211–911)

## 2023-01-06 LAB — MAGNESIUM: Magnesium: 1.5 mg/dL (ref 1.5–2.5)

## 2023-01-06 MED ORDER — VITAMIN B-12 1000 MCG PO TABS: 1000.0000 ug | ORAL_TABLET | Freq: Every day | ORAL | 1 refills | Status: DC

## 2023-01-06 MED ORDER — MAGNESIUM 250 MG PO CAPS: 250.0000 mg | ORAL_CAPSULE | Freq: Every day | ORAL | 2 refills | Status: AC

## 2023-01-06 MED ORDER — POTASSIUM CHLORIDE CRYS ER 20 MEQ PO TBCR: 20.0000 meq | EXTENDED_RELEASE_TABLET | Freq: Every day | ORAL | 3 refills | Status: DC

## 2023-01-06 NOTE — Addendum Note (Signed)
Addended by: Andrez Grime on: 01/06/2023 04:19 PM   Modules accepted: Orders

## 2023-01-06 NOTE — Addendum Note (Signed)
Addended by: Nadene Rubins A on: 01/06/2023 01:30 PM   Modules accepted: Orders

## 2023-01-06 NOTE — Progress Notes (Addendum)
Established Patient Office Visit   Subjective:  Patient ID: Chelsea Dean, female    DOB: 02/28/1927  Age: 89 y.o. MRN: [id number]  Chief Complaint  Patient presents with   Medical Management of Chronic Issues    3 month follow up. Pt is not fasting.     HPI Encounter Diagnoses  Name Primary?   Iron deficiency Yes   Microcytic anemia    Hypokalemia    Need for immunization against influenza    B12 deficiency    For follow-up of above.  Accompanied by her daughter Toney Reil.  Continues with iron supplementation.  Follow-up of hypokalemia.  She is on no medications that would waste potassium.  Continues on medications below.  Continues to tolerate iron therapy   Review of Systems  Constitutional: Negative.   HENT: Negative.    Eyes:  Negative for blurred vision, discharge and redness.  Respiratory: Negative.    Cardiovascular: Negative.   Gastrointestinal:  Negative for abdominal pain.  Genitourinary: Negative.   Musculoskeletal: Negative.  Negative for myalgias.  Skin:  Negative for rash.  Neurological:  Negative for tingling, loss of consciousness and weakness.  Endo/Heme/Allergies:  Negative for polydipsia.     Current Outpatient Medications:    acetaminophen (TYLENOL) 650 MG CR tablet, Take 650-1,300 mg by mouth every 8 (eight) hours as needed for pain., Disp: , Rfl:    amLODipine (NORVASC) 10 MG tablet, TAKE 1 TABLET BY MOUTH EVERY DAY, Disp: 90 tablet, Rfl: 1   atorvastatin (LIPITOR) 10 MG tablet, TAKE 1 TABLET BY MOUTH EVERY DAY, Disp: 90 tablet, Rfl: 3   cyanocobalamin (VITAMIN B12) 1000 MCG tablet, Take 1 tablet (1,000 mcg total) by mouth daily., Disp: 90 tablet, Rfl: 1   diclofenac sodium (VOLTAREN) 1 % GEL, Apply 2 g topically daily as needed (KNEE PAIN)., Disp: , Rfl:    ELIQUIS 5 MG TABS tablet, TAKE 1 TABLET BY MOUTH 2 TIMES A DAY, Disp: 60 tablet, Rfl: 5   Iron, Ferrous Sulfate, 325 (65 Fe) MG TABS, Take one twice daily by mouth if possible, Disp: 60 tablet,  Rfl: 2   JARDIANCE 10 MG TABS tablet, TAKE 1 TABLET BY MOUTH EVERY MORNING BEFORE BREAKFAST, Disp: 90 tablet, Rfl: 0   losartan (COZAAR) 100 MG tablet, TAKE 1 TABLET BY MOUTH EVERY DAY, Disp: 90 tablet, Rfl: 1   Magnesium 250 MG CAPS, Take 250 mg by mouth daily., Disp: 90 capsule, Rfl: 2   metoprolol tartrate (LOPRESSOR) 50 MG tablet, TAKE 1 TABLET BY MOUTH 2 TIMES A DAY, Disp: 180 tablet, Rfl: 3   Multiple Vitamin (MULTIVITAMIN) tablet, Take 1 tablet by mouth daily., Disp: , Rfl:    potassium chloride SA (KLOR-CON M) 20 MEQ tablet, Take 1 tablet (20 mEq total) by mouth daily., Disp: 30 tablet, Rfl: 3   traMADol (ULTRAM) 50 MG tablet, TAKE 1 TABLET BY MOUTH EVERY 12 HOURS ASNEEDED FOR PAIN (Patient not taking: Reported on 08/30/2022), Disp: 60 tablet, Rfl: 0   traZODone (DESYREL) 50 MG tablet, 1/2 po q hs prn sleep (Patient not taking: Reported on 07/26/2022), Disp: 30 tablet, Rfl: 1   Objective:     BP 124/68   Pulse 69   Temp 98.1 F (36.7 C)   Ht 5\' 2"  (1.575 m)   Wt 142 lb (64.4 kg)   SpO2 95%   BMI 25.97 kg/m    Physical Exam Constitutional:      General: She is not in acute distress.    Appearance: Normal  appearance. She is not ill-appearing, toxic-appearing or diaphoretic.  HENT:     Head: Normocephalic and atraumatic.     Right Ear: External ear normal.     Left Ear: External ear normal.  Eyes:     General: No scleral icterus.       Right eye: No discharge.        Left eye: No discharge.     Extraocular Movements: Extraocular movements intact.     Conjunctiva/sclera: Conjunctivae normal.  Cardiovascular:     Rate and Rhythm: Normal rate and regular rhythm.  Pulmonary:     Effort: Pulmonary effort is normal. No respiratory distress.     Breath sounds: No wheezing, rhonchi or rales.  Skin:    General: Skin is warm and dry.  Neurological:     Mental Status: She is alert and oriented to person, place, and time.  Psychiatric:        Mood and Affect: Mood normal.         Behavior: Behavior normal.      Results for orders placed or performed in visit on 01/06/23  Basic metabolic panel  Result Value Ref Range   Sodium 141 135 - 145 mEq/L   Potassium 2.7 (LL) 3.5 - 5.1 mEq/L   Chloride 101 96 - 112 mEq/L   CO2 28 19 - 32 mEq/L   Glucose, Bld 163 (H) 70 - 99 mg/dL   BUN 16 6 - 23 mg/dL   Creatinine, Ser 1.61 (H) 0.40 - 1.20 mg/dL   GFR 09.60 (L) >45.40 mL/min   Calcium 9.8 8.4 - 10.5 mg/dL  CBC  Result Value Ref Range   WBC 5.4 4.0 - 10.5 K/uL   RBC 4.59 3.87 - 5.11 Mil/uL   Platelets 99.0 (L) 150.0 - 400.0 K/uL   Hemoglobin 11.6 (L) 12.0 - 15.0 g/dL   HCT 98.1 19.1 - 47.8 %   MCV 81.8 78.0 - 100.0 fl   MCHC 31.0 30.0 - 36.0 g/dL   RDW 29.5 (H) 62.1 - 30.8 %  Magnesium  Result Value Ref Range   Magnesium 1.5 1.5 - 2.5 mg/dL  Vitamin M57  Result Value Ref Range   Vitamin B-12 242 211 - 911 pg/mL      The ASCVD Risk score (Arnett DK, et al., 2019) failed to calculate for the following reasons:   The 2019 ASCVD risk score is only valid for ages 39 to 32    Assessment & Plan:   Iron deficiency -     CBC -     Iron, TIBC and Ferritin Panel  Microcytic anemia -     CBC -     Vitamin B12  Hypokalemia -     Basic metabolic panel -     Magnesium -     Potassium Chloride Crys ER; Take 1 tablet (20 mEq total) by mouth daily.  Dispense: 30 tablet; Refill: 3 -     Magnesium; Take 250 mg by mouth daily.  Dispense: 90 capsule; Refill: 2  Need for immunization against influenza -     Flu Vaccine Trivalent High Dose (Fluad)  B12 deficiency -     Vitamin B-12; Take 1 tablet (1,000 mcg total) by mouth daily.  Dispense: 90 tablet; Refill: 1    Return in about 4 weeks (around 02/03/2023).    Mliss Sax, MD

## 2023-01-06 NOTE — Addendum Note (Signed)
Addended by: Andrez Grime on: 01/06/2023 04:20 PM   Modules accepted: Orders

## 2023-01-07 LAB — IRON,TIBC AND FERRITIN PANEL
%SAT: 16 % (ref 16–45)
Ferritin: 102 ng/mL (ref 16–288)
Iron: 47 ug/dL (ref 45–160)
TIBC: 295 ug/dL (ref 250–450)

## 2023-01-12 ENCOUNTER — Telehealth: Payer: Self-pay | Admitting: Family Medicine

## 2023-01-12 NOTE — Telephone Encounter (Signed)
Caller Name: Wendie Simmer daughter Jethro Bolus Ph #: [phone] Chief Complaint: Since she started taking potassium chloride she has had constant diarrhea.   This call was transferred to Nurse Triage/Access Nurse. This is for documentation purposes. No follow up required at this time.

## 2023-01-25 ENCOUNTER — Ambulatory Visit (INDEPENDENT_AMBULATORY_CARE_PROVIDER_SITE_OTHER): Payer: 59 | Admitting: Family Medicine

## 2023-01-25 ENCOUNTER — Encounter: Payer: Self-pay | Admitting: Family Medicine

## 2023-01-25 DIAGNOSIS — E876 Hypokalemia: Secondary | ICD-10-CM | POA: Diagnosis not present

## 2023-01-25 LAB — BASIC METABOLIC PANEL
BUN: 18 mg/dL (ref 6–23)
CO2: 30 meq/L (ref 19–32)
Calcium: 10.2 mg/dL (ref 8.4–10.5)
Chloride: 102 meq/L (ref 96–112)
Creatinine, Ser: 1.33 mg/dL — ABNORMAL HIGH (ref 0.40–1.20)
GFR: 34.44 mL/min — ABNORMAL LOW (ref >60.00)
Glucose, Bld: 136 mg/dL — ABNORMAL HIGH (ref 70–99)
Potassium: 3 meq/L — ABNORMAL LOW (ref 3.5–5.1)
Sodium: 142 meq/L (ref 135–145)

## 2023-01-25 MED ORDER — POTASSIUM CHLORIDE CRYS ER 20 MEQ PO TBCR: 20.0000 meq | EXTENDED_RELEASE_TABLET | ORAL | 3 refills | Status: AC

## 2023-01-25 NOTE — Progress Notes (Signed)
Established Patient Office Visit   Subjective:  Patient ID: Chelsea Dean, female    DOB: 1927-10-17  Age: 89 y.o. MRN: [id number]  Chief Complaint  Patient presents with   Medical Management of Chronic Issues    Follow up. Daughter states the potassium caused diarrhea so she cut down to every other day.     HPI Encounter Diagnoses  Name Primary?   Hypokalemia    For follow-up of hypokalemia.  Developed diarrhea while taking the 20 mEq daily.  Daughter backed it off to every other day and diarrhea resolved.   Review of Systems  Constitutional: Negative.   HENT: Negative.    Eyes:  Negative for blurred vision, discharge and redness.  Respiratory: Negative.    Cardiovascular: Negative.   Gastrointestinal:  Negative for abdominal pain.  Genitourinary: Negative.   Musculoskeletal: Negative.  Negative for myalgias.  Skin:  Negative for rash.  Neurological:  Negative for tingling, loss of consciousness and weakness.  Endo/Heme/Allergies:  Negative for polydipsia.     Current Outpatient Medications:    acetaminophen (TYLENOL) 650 MG CR tablet, Take 650-1,300 mg by mouth every 8 (eight) hours as needed for pain., Disp: , Rfl:    amLODipine (NORVASC) 10 MG tablet, TAKE 1 TABLET BY MOUTH EVERY DAY, Disp: 90 tablet, Rfl: 1   atorvastatin (LIPITOR) 10 MG tablet, TAKE 1 TABLET BY MOUTH EVERY DAY, Disp: 90 tablet, Rfl: 3   cyanocobalamin (VITAMIN B12) 1000 MCG tablet, Take 1 tablet (1,000 mcg total) by mouth daily., Disp: 90 tablet, Rfl: 1   diclofenac sodium (VOLTAREN) 1 % GEL, Apply 2 g topically daily as needed (KNEE PAIN)., Disp: , Rfl:    ELIQUIS 5 MG TABS tablet, TAKE 1 TABLET BY MOUTH 2 TIMES A DAY, Disp: 60 tablet, Rfl: 5   Iron, Ferrous Sulfate, 325 (65 Fe) MG TABS, Take one twice daily by mouth if possible, Disp: 60 tablet, Rfl: 2   JARDIANCE 10 MG TABS tablet, TAKE 1 TABLET BY MOUTH EVERY MORNING BEFORE BREAKFAST, Disp: 90 tablet, Rfl: 0   losartan (COZAAR) 100 MG tablet,  TAKE 1 TABLET BY MOUTH EVERY DAY, Disp: 90 tablet, Rfl: 1   Magnesium 250 MG CAPS, Take 250 mg by mouth daily., Disp: 90 capsule, Rfl: 2   metoprolol tartrate (LOPRESSOR) 50 MG tablet, TAKE 1 TABLET BY MOUTH 2 TIMES A DAY, Disp: 180 tablet, Rfl: 3   Multiple Vitamin (MULTIVITAMIN) tablet, Take 1 tablet by mouth daily., Disp: , Rfl:    potassium chloride SA (KLOR-CON M) 20 MEQ tablet, Take 1 tablet (20 mEq total) by mouth every other day., Disp: 45 tablet, Rfl: 3   traMADol (ULTRAM) 50 MG tablet, TAKE 1 TABLET BY MOUTH EVERY 12 HOURS ASNEEDED FOR PAIN (Patient not taking: Reported on 08/30/2022), Disp: 60 tablet, Rfl: 0   traZODone (DESYREL) 50 MG tablet, 1/2 po q hs prn sleep (Patient not taking: Reported on 07/26/2022), Disp: 30 tablet, Rfl: 1   Objective:     BP 122/72   Pulse (!) 50   Temp (!) 97.3 F (36.3 C)   Ht 5\' 2"  (1.575 m)   Wt 141 lb 3.2 oz (64 kg)   SpO2 97%   BMI 25.83 kg/m    Physical Exam Constitutional:      General: She is not in acute distress.    Appearance: Normal appearance. She is not ill-appearing, toxic-appearing or diaphoretic.  HENT:     Head: Normocephalic and atraumatic.     Right Ear:  External ear normal.     Left Ear: External ear normal.  Eyes:     General: No scleral icterus.       Right eye: No discharge.        Left eye: No discharge.     Extraocular Movements: Extraocular movements intact.     Conjunctiva/sclera: Conjunctivae normal.  Pulmonary:     Effort: Pulmonary effort is normal. No respiratory distress.  Skin:    General: Skin is warm and dry.  Neurological:     Mental Status: She is alert and oriented to person, place, and time.  Psychiatric:        Mood and Affect: Mood normal.        Behavior: Behavior normal.      No results found for any visits on 01/25/23.    The ASCVD Risk score (Arnett DK, et al., 2019) failed to calculate for the following reasons:   The 2019 ASCVD risk score is only valid for ages 76 to 72     Assessment & Plan:   Hypokalemia -     Basic metabolic panel -     Potassium Chloride Crys ER; Take 1 tablet (20 mEq total) by mouth every other day.  Dispense: 45 tablet; Refill: 3    Return in about 8 weeks (around 03/22/2023).  Continue the 20 mEq tablet every other day.  Information was given on hypokalemia.  Mliss Sax, MD

## 2023-02-01 ENCOUNTER — Ambulatory Visit: Payer: 59 | Admitting: Family Medicine

## 2023-03-17 ENCOUNTER — Other Ambulatory Visit: Payer: Self-pay | Admitting: Family Medicine

## 2023-03-17 DIAGNOSIS — I10 Essential (primary) hypertension: Secondary | ICD-10-CM

## 2023-03-17 DIAGNOSIS — N183 Chronic kidney disease, stage 3 unspecified: Secondary | ICD-10-CM

## 2023-03-17 DIAGNOSIS — E119 Type 2 diabetes mellitus without complications: Secondary | ICD-10-CM

## 2023-03-25 ENCOUNTER — Other Ambulatory Visit: Payer: Self-pay | Admitting: Family Medicine

## 2023-03-25 DIAGNOSIS — E119 Type 2 diabetes mellitus without complications: Secondary | ICD-10-CM

## 2023-03-25 DIAGNOSIS — N1831 Chronic kidney disease, stage 3a: Secondary | ICD-10-CM

## 2023-03-26 ENCOUNTER — Other Ambulatory Visit: Payer: Self-pay | Admitting: Family Medicine

## 2023-03-26 DIAGNOSIS — I4891 Unspecified atrial fibrillation: Secondary | ICD-10-CM

## 2023-04-08 ENCOUNTER — Ambulatory Visit: Payer: 59 | Admitting: Family Medicine

## 2023-04-12 ENCOUNTER — Ambulatory Visit (INDEPENDENT_AMBULATORY_CARE_PROVIDER_SITE_OTHER): Payer: 59 | Admitting: Family Medicine

## 2023-04-12 ENCOUNTER — Encounter: Payer: Self-pay | Admitting: Family Medicine

## 2023-04-12 VITALS — BP 104/64 | HR 59 | Temp 97.3°F | Ht 62.0 in | Wt 139.4 lb

## 2023-04-12 DIAGNOSIS — N1832 Chronic kidney disease, stage 3b: Secondary | ICD-10-CM | POA: Diagnosis not present

## 2023-04-12 DIAGNOSIS — R7303 Prediabetes: Secondary | ICD-10-CM

## 2023-04-12 DIAGNOSIS — E876 Hypokalemia: Secondary | ICD-10-CM | POA: Diagnosis not present

## 2023-04-12 LAB — MAGNESIUM: Magnesium: 2.2 mg/dL (ref 1.5–2.5)

## 2023-04-12 LAB — BASIC METABOLIC PANEL
BUN: 26 mg/dL — ABNORMAL HIGH (ref 6–23)
CO2: 25 meq/L (ref 19–32)
Calcium: 10.4 mg/dL (ref 8.4–10.5)
Chloride: 107 meq/L (ref 96–112)
Creatinine, Ser: 1.55 mg/dL — ABNORMAL HIGH (ref 0.40–1.20)
GFR: 28.62 mL/min — ABNORMAL LOW (ref >60.00)
Glucose, Bld: 62 mg/dL — ABNORMAL LOW (ref 70–99)
Potassium: 4.1 meq/L (ref 3.5–5.1)
Sodium: 142 meq/L (ref 135–145)

## 2023-04-12 LAB — HEMOGLOBIN A1C: Hgb A1c MFr Bld: 6.7 % — ABNORMAL HIGH (ref 4.6–6.5)

## 2023-04-12 NOTE — Progress Notes (Addendum)
 Established Patient Office Visit   Subjective:  Patient ID: Chelsea Dean, female    DOB: 05-31-1927  Age: 89 y.o. MRN: [id number]  Chief Complaint  Patient presents with   Medical Management of Chronic Issues    8 wek follow up K check.     HPI Encounter Diagnoses  Name Primary?   Hypokalemia Yes   Hypomagnesemia    Prediabetes    Stage 3b chronic kidney disease (HCC)    Follow-up of above.  Continues to potassium chloride every other day along with magnesium 250 mg daily for hypokalemia and Magnesemia.  Continues Jardiance for prediabetes and CKD 3B.   Review of Systems  Constitutional: Negative.   HENT:  Positive for hearing loss.   Eyes:  Negative for blurred vision, discharge and redness.  Respiratory: Negative.    Cardiovascular: Negative.   Gastrointestinal:  Negative for abdominal pain.  Genitourinary: Negative.   Musculoskeletal: Negative.  Negative for myalgias.  Skin:  Negative for rash.  Neurological:  Negative for tingling, loss of consciousness and weakness.  Endo/Heme/Allergies:  Negative for polydipsia.     Current Outpatient Medications:    acetaminophen (TYLENOL) 650 MG CR tablet, Take 650-1,300 mg by mouth every 8 (eight) hours as needed for pain., Disp: , Rfl:    amLODipine (NORVASC) 10 MG tablet, TAKE 1 TABLET BY MOUTH EVERY DAY, Disp: 90 tablet, Rfl: 1   atorvastatin (LIPITOR) 10 MG tablet, TAKE 1 TABLET BY MOUTH EVERY DAY, Disp: 90 tablet, Rfl: 3   cyanocobalamin (VITAMIN B12) 1000 MCG tablet, Take 1 tablet (1,000 mcg total) by mouth daily., Disp: 90 tablet, Rfl: 1   diclofenac sodium (VOLTAREN) 1 % GEL, Apply 2 g topically daily as needed (KNEE PAIN)., Disp: , Rfl:    ELIQUIS 5 MG TABS tablet, TAKE 1 TABLET BY MOUTH 2 TIMES A DAY, Disp: 60 tablet, Rfl: 5   Iron, Ferrous Sulfate, 325 (65 Fe) MG TABS, Take one twice daily by mouth if possible, Disp: 60 tablet, Rfl: 2   JARDIANCE 10 MG TABS tablet, TAKE 1 TABLET BY MOUTH EVERY MORNING BEFORE  BREAKFAST, Disp: 90 tablet, Rfl: 0   losartan (COZAAR) 100 MG tablet, TAKE 1 TABLET BY MOUTH EVERY DAY, Disp: 90 tablet, Rfl: 1   Magnesium 250 MG CAPS, Take 250 mg by mouth daily., Disp: 90 capsule, Rfl: 2   metoprolol tartrate (LOPRESSOR) 50 MG tablet, TAKE 1 TABLET BY MOUTH 2 TIMES A DAY, Disp: 180 tablet, Rfl: 3   Multiple Vitamin (MULTIVITAMIN) tablet, Take 1 tablet by mouth daily., Disp: , Rfl:    potassium chloride SA (KLOR-CON M) 20 MEQ tablet, Take 1 tablet (20 mEq total) by mouth every other day., Disp: 45 tablet, Rfl: 3   traMADol (ULTRAM) 50 MG tablet, TAKE 1 TABLET BY MOUTH EVERY 12 HOURS ASNEEDED FOR PAIN, Disp: 60 tablet, Rfl: 0   traZODone (DESYREL) 50 MG tablet, 1/2 po q hs prn sleep, Disp: 30 tablet, Rfl: 1   Objective:     BP 104/64   Pulse (!) 59   Temp (!) 97.3 F (36.3 C)   Ht 5\' 2"  (1.575 m)   Wt 139 lb 6.4 oz (63.2 kg)   SpO2 94%   BMI 25.50 kg/m  Wt Readings from Last 3 Encounters:  04/12/23 139 lb 6.4 oz (63.2 kg)  01/25/23 141 lb 3.2 oz (64 kg)  01/06/23 142 lb (64.4 kg)      Physical Exam Constitutional:      General: She is  not in acute distress.    Appearance: Normal appearance. She is not ill-appearing, toxic-appearing or diaphoretic.  HENT:     Head: Normocephalic and atraumatic.     Right Ear: External ear normal.     Left Ear: External ear normal.  Eyes:     General: No scleral icterus.       Right eye: No discharge.        Left eye: No discharge.     Extraocular Movements: Extraocular movements intact.     Conjunctiva/sclera: Conjunctivae normal.  Pulmonary:     Effort: Pulmonary effort is normal. No respiratory distress.  Skin:    General: Skin is warm and dry.  Neurological:     Mental Status: She is alert and oriented to person, place, and time.  Psychiatric:        Mood and Affect: Mood normal.        Behavior: Behavior normal.      Results for orders placed or performed in visit on 04/12/23  Basic metabolic panel  Result  Value Ref Range   Sodium 142 135 - 145 mEq/L   Potassium 4.1 3.5 - 5.1 mEq/L   Chloride 107 96 - 112 mEq/L   CO2 25 19 - 32 mEq/L   Glucose, Bld 62 (L) 70 - 99 mg/dL   BUN 26 (H) 6 - 23 mg/dL   Creatinine, Ser 9.14 (H) 0.40 - 1.20 mg/dL   GFR 78.29 (L) >56.21 mL/min   Calcium 10.4 8.4 - 10.5 mg/dL  Hemoglobin H0Q  Result Value Ref Range   Hgb A1c MFr Bld 6.7 (H) 4.6 - 6.5 %  Magnesium  Result Value Ref Range   Magnesium 2.2 1.5 - 2.5 mg/dL      The ASCVD Risk score (Arnett DK, et al., 2019) failed to calculate for the following reasons:   The 2019 ASCVD risk score is only valid for ages 14 to 57    Assessment & Plan:   Hypokalemia -     Basic metabolic panel -     Aldosterone + renin activity w/ ratio  Hypomagnesemia -     Magnesium  Prediabetes -     Basic metabolic panel -     Hemoglobin A1c  Stage 3b chronic kidney disease (HCC) -     Basic metabolic panel -     Multiple Myeloma Panel (SPEP&IFE w/QIG); Future    Return in about 3 months (around 07/10/2023), or if symptoms worsen or fail to improve.    Mliss Sax, MD

## 2023-04-12 NOTE — Addendum Note (Signed)
 Addended by: Andrez Grime on: 04/12/2023 04:29 PM   Modules accepted: Orders

## 2023-04-17 LAB — ALDOSTERONE + RENIN ACTIVITY W/ RATIO
ALDO / PRA Ratio: 1.8 ratio (ref 0.9–28.9)
Aldosterone: 4 ng/dL
Renin Activity: 2.25 ng/mL/h (ref 0.25–5.82)

## 2023-04-18 ENCOUNTER — Other Ambulatory Visit (INDEPENDENT_AMBULATORY_CARE_PROVIDER_SITE_OTHER)

## 2023-04-18 DIAGNOSIS — N1832 Chronic kidney disease, stage 3b: Secondary | ICD-10-CM

## 2023-04-22 ENCOUNTER — Other Ambulatory Visit: Payer: Self-pay | Admitting: Family Medicine

## 2023-04-22 DIAGNOSIS — E538 Deficiency of other specified B group vitamins: Secondary | ICD-10-CM

## 2023-04-22 DIAGNOSIS — I10 Essential (primary) hypertension: Secondary | ICD-10-CM

## 2023-04-22 LAB — MULTIPLE MYELOMA PANEL, SERUM
Albumin SerPl Elph-Mcnc: 3.7 g/dL (ref 2.9–4.4)
Albumin/Glob SerPl: 1.2 (ref 0.7–1.7)
Alpha 1: 0.3 g/dL (ref 0.0–0.4)
Alpha2 Glob SerPl Elph-Mcnc: 0.7 g/dL (ref 0.4–1.0)
B-Globulin SerPl Elph-Mcnc: 1 g/dL (ref 0.7–1.3)
Gamma Glob SerPl Elph-Mcnc: 1.2 g/dL (ref 0.4–1.8)
Globulin, Total: 3.2 g/dL (ref 2.2–3.9)
IgA/Immunoglobulin A, Serum: 231 mg/dL (ref 64–422)
IgG (Immunoglobin G), Serum: 1277 mg/dL (ref 586–1602)
IgM (Immunoglobulin M), Srm: 31 mg/dL (ref 26–217)
Total Protein: 6.9 g/dL (ref 6.0–8.5)

## 2023-06-01 LAB — HM DIABETES EYE EXAM

## 2023-06-17 ENCOUNTER — Other Ambulatory Visit: Payer: Self-pay | Admitting: Family Medicine

## 2023-06-17 DIAGNOSIS — N1831 Chronic kidney disease, stage 3a: Secondary | ICD-10-CM

## 2023-06-17 DIAGNOSIS — E119 Type 2 diabetes mellitus without complications: Secondary | ICD-10-CM

## 2023-06-17 DIAGNOSIS — I10 Essential (primary) hypertension: Secondary | ICD-10-CM

## 2023-06-17 DIAGNOSIS — N183 Chronic kidney disease, stage 3 unspecified: Secondary | ICD-10-CM

## 2023-07-01 DIAGNOSIS — H905 Unspecified sensorineural hearing loss: Secondary | ICD-10-CM | POA: Diagnosis not present

## 2023-07-12 ENCOUNTER — Ambulatory Visit: Payer: 59 | Admitting: Family Medicine

## 2023-07-15 DIAGNOSIS — J069 Acute upper respiratory infection, unspecified: Secondary | ICD-10-CM | POA: Diagnosis not present

## 2023-07-15 DIAGNOSIS — R051 Acute cough: Secondary | ICD-10-CM | POA: Diagnosis not present

## 2023-07-26 ENCOUNTER — Encounter: Payer: Self-pay | Admitting: Family Medicine

## 2023-07-26 ENCOUNTER — Ambulatory Visit (INDEPENDENT_AMBULATORY_CARE_PROVIDER_SITE_OTHER): Admitting: Family Medicine

## 2023-07-26 VITALS — BP 114/72 | HR 57 | Temp 97.0°F | Ht 62.0 in | Wt 140.2 lb

## 2023-07-26 DIAGNOSIS — N1832 Chronic kidney disease, stage 3b: Secondary | ICD-10-CM | POA: Diagnosis not present

## 2023-07-26 DIAGNOSIS — R7303 Prediabetes: Secondary | ICD-10-CM

## 2023-07-26 DIAGNOSIS — E538 Deficiency of other specified B group vitamins: Secondary | ICD-10-CM | POA: Diagnosis not present

## 2023-07-26 DIAGNOSIS — R058 Other specified cough: Secondary | ICD-10-CM | POA: Diagnosis not present

## 2023-07-26 DIAGNOSIS — E7800 Pure hypercholesterolemia, unspecified: Secondary | ICD-10-CM

## 2023-07-26 LAB — BASIC METABOLIC PANEL WITH GFR
BUN: 38 mg/dL — ABNORMAL HIGH (ref 6–23)
CO2: 27 meq/L (ref 19–32)
Calcium: 10.4 mg/dL (ref 8.4–10.5)
Chloride: 103 meq/L (ref 96–112)
Creatinine, Ser: 1.85 mg/dL — ABNORMAL HIGH (ref 0.40–1.20)
GFR: 23.1 mL/min — ABNORMAL LOW (ref >60.00)
Glucose, Bld: 163 mg/dL — ABNORMAL HIGH (ref 70–99)
Potassium: 3.9 meq/L (ref 3.5–5.1)
Sodium: 138 meq/L (ref 135–145)

## 2023-07-26 LAB — HEMOGLOBIN A1C: Hgb A1c MFr Bld: 6.5 % (ref 4.6–6.5)

## 2023-07-26 LAB — LDL CHOLESTEROL, DIRECT: Direct LDL: 70 mg/dL

## 2023-07-26 MED ORDER — BENZONATATE 200 MG PO CAPS
200.0000 mg | ORAL_CAPSULE | Freq: Two times a day (BID) | ORAL | 0 refills | Status: AC | PRN
Start: 2023-07-26 — End: ?

## 2023-07-26 NOTE — Progress Notes (Signed)
 Established Patient Office Visit   Subjective:  Patient ID: Chelsea Dean, female    DOB: 25-Jan-1928  Age: 89 y.o. MRN: [id number]  Chief Complaint  Patient presents with   Hospitalization Follow-up    3 month follow up. Pt is not fasting.    Cough    Pt had a cough 2 weeks ago. Was given antibiotics and cough medicine. Pt care giver requesting a refill for cough medicine due to pt still coughing a lot at night.     Cough Pertinent negatives include no eye redness, myalgias or rash.   Encounter Diagnoses  Name Primary?   Prediabetes Yes   Stage 3b chronic kidney disease (HCC)    B12 deficiency    Hypomagnesemia    Elevated LDL cholesterol level    Post-viral cough syndrome    For follow-up of above.  Continues on medications as indicated below.  Continues potassium every other day.  Ongoing dry cough after URI are over the past 2 weeks.  Denies wheezing difficulty breathing fever or chills.   Review of Systems  Constitutional: Negative.   HENT: Negative.    Eyes:  Negative for blurred vision, discharge and redness.  Respiratory:  Positive for cough.   Cardiovascular: Negative.   Gastrointestinal:  Negative for abdominal pain.  Genitourinary: Negative.   Musculoskeletal: Negative.  Negative for myalgias.  Skin:  Negative for rash.  Neurological:  Negative for tingling, loss of consciousness and weakness.  Endo/Heme/Allergies:  Negative for polydipsia.     Current Outpatient Medications:    acetaminophen  (TYLENOL ) 650 MG CR tablet, Take 650-1,300 mg by mouth every 8 (eight) hours as needed for pain., Disp: , Rfl:    amLODipine  (NORVASC ) 10 MG tablet, TAKE 1 TABLET BY MOUTH EVERY DAY, Disp: 90 tablet, Rfl: 1   atorvastatin  (LIPITOR) 10 MG tablet, TAKE 1 TABLET BY MOUTH EVERY DAY, Disp: 90 tablet, Rfl: 3   benzonatate (TESSALON) 200 MG capsule, Take 1 capsule (200 mg total) by mouth 2 (two) times daily as needed for cough., Disp: 20 capsule, Rfl: 0   Cyanocobalamin   (B-12) 1000 MCG TABS, TAKE 1 TABLET BY MOUTH EVERY DAY, Disp: 100 tablet, Rfl: 0   diclofenac sodium (VOLTAREN) 1 % GEL, Apply 2 g topically daily as needed (KNEE PAIN)., Disp: , Rfl:    ELIQUIS  5 MG TABS tablet, TAKE 1 TABLET BY MOUTH 2 TIMES A DAY, Disp: 60 tablet, Rfl: 5   GNP VITAMIN B-12 1000 MCG TBCR, TAKE 1 TABLET BY MOUTH EVERY DAY, Disp: 90 tablet, Rfl: 1   Iron , Ferrous Sulfate , 325 (65 Fe) MG TABS, Take one twice daily by mouth if possible, Disp: 60 tablet, Rfl: 2   JARDIANCE  10 MG TABS tablet, TAKE 1 TABLET BY MOUTH EVERY MORNING BEFORE BREAKFAST, Disp: 90 tablet, Rfl: 0   losartan  (COZAAR ) 100 MG tablet, TAKE 1 TABLET BY MOUTH EVERY DAY, Disp: 90 tablet, Rfl: 1   Magnesium  250 MG CAPS, Take 250 mg by mouth daily., Disp: 90 capsule, Rfl: 2   metoprolol  tartrate (LOPRESSOR ) 50 MG tablet, TAKE 1 TABLET BY MOUTH 2 TIMES A DAY, Disp: 180 tablet, Rfl: 3   Multiple Vitamin (MULTIVITAMIN) tablet, Take 1 tablet by mouth daily., Disp: , Rfl:    potassium chloride  SA (KLOR-CON  M) 20 MEQ tablet, Take 1 tablet (20 mEq total) by mouth every other day., Disp: 45 tablet, Rfl: 3   traMADol  (ULTRAM ) 50 MG tablet, TAKE 1 TABLET BY MOUTH EVERY 12 HOURS ASNEEDED FOR PAIN, Disp:  60 tablet, Rfl: 0   traZODone  (DESYREL ) 50 MG tablet, 1/2 po q hs prn sleep, Disp: 30 tablet, Rfl: 1   Objective:     BP 114/72 (Cuff Size: Normal)   Pulse (!) 57   Temp (!) 97 F (36.1 C) (Temporal)   Ht 5\' 2"  (1.575 m)   Wt 140 lb 3.2 oz (63.6 kg)   SpO2 97%   BMI 25.64 kg/m    Physical Exam Constitutional:      General: She is not in acute distress.    Appearance: Normal appearance. She is not ill-appearing, toxic-appearing or diaphoretic.  HENT:     Head: Normocephalic and atraumatic.     Right Ear: External ear normal.     Left Ear: External ear normal.     Mouth/Throat:     Mouth: Mucous membranes are moist.     Pharynx: Oropharynx is clear. No oropharyngeal exudate or posterior oropharyngeal erythema.   Eyes:     General: No scleral icterus.       Right eye: No discharge.        Left eye: No discharge.     Extraocular Movements: Extraocular movements intact.     Conjunctiva/sclera: Conjunctivae normal.     Pupils: Pupils are equal, round, and reactive to light.  Cardiovascular:     Rate and Rhythm: Normal rate and regular rhythm.  Pulmonary:     Effort: Pulmonary effort is normal. No respiratory distress.     Breath sounds: Normal breath sounds. No wheezing, rhonchi or rales.  Abdominal:     General: Bowel sounds are normal.     Tenderness: There is no abdominal tenderness. There is no guarding.  Musculoskeletal:     Cervical back: No rigidity or tenderness.     Right lower leg: No edema.     Left lower leg: No edema.  Skin:    General: Skin is warm and dry.  Neurological:     Mental Status: She is alert and oriented to person, place, and time.  Psychiatric:        Mood and Affect: Mood normal.        Behavior: Behavior normal.      No results found for any visits on 07/26/23.    The ASCVD Risk score (Arnett DK, et al., 2019) failed to calculate for the following reasons:   The 2019 ASCVD risk score is only valid for ages 30 to 5    Assessment & Plan:   Prediabetes -     Basic metabolic panel with GFR -     Hemoglobin A1c  Stage 3b chronic kidney disease (HCC) -     Basic metabolic panel with GFR  B12 deficiency -     Vitamin B12  Hypomagnesemia -     Magnesium   Elevated LDL cholesterol level -     LDL cholesterol, direct  Post-viral cough syndrome -     Benzonatate; Take 1 capsule (200 mg total) by mouth 2 (two) times daily as needed for cough.  Dispense: 20 capsule; Refill: 0    Return in about 3 months (around 10/26/2023).  Continue all medicines as above.  Will make adjustments pending labs.  Tessalon as needed for postinflammatory cough syndrome.  Could consider low-dose prednisone  if there is no improvement in the next week or so.  Tonna Frederic, MD

## 2023-07-27 LAB — VITAMIN B12: Vitamin B-12: 1841 pg/mL — ABNORMAL HIGH (ref 200–1100)

## 2023-07-27 LAB — MAGNESIUM: Magnesium: 2.3 mg/dL (ref 1.5–2.5)

## 2023-07-28 ENCOUNTER — Ambulatory Visit: Payer: Self-pay | Admitting: Family Medicine

## 2023-08-01 ENCOUNTER — Other Ambulatory Visit: Payer: Self-pay | Admitting: Family Medicine

## 2023-08-01 ENCOUNTER — Telehealth: Payer: Self-pay | Admitting: Family Medicine

## 2023-08-01 DIAGNOSIS — N1832 Chronic kidney disease, stage 3b: Secondary | ICD-10-CM

## 2023-08-01 NOTE — Telephone Encounter (Signed)
 Copied from CRM [id number]. Topic: Clinical - Medical Advice >> Aug 01, 2023  9:53 AM Keitha Pata L wrote: Reason for CRM: patient daughter is calling in reference to a referral status and what the doctor needs her mother to do. Please call Cathyann Cobia [phone]

## 2023-08-08 ENCOUNTER — Ambulatory Visit (INDEPENDENT_AMBULATORY_CARE_PROVIDER_SITE_OTHER): Admitting: Family Medicine

## 2023-08-08 ENCOUNTER — Encounter: Payer: Self-pay | Admitting: Family Medicine

## 2023-08-08 VITALS — BP 98/62 | HR 64 | Temp 96.4°F | Ht 62.0 in | Wt 138.4 lb

## 2023-08-08 DIAGNOSIS — K921 Melena: Secondary | ICD-10-CM | POA: Diagnosis not present

## 2023-08-08 DIAGNOSIS — Z79899 Other long term (current) drug therapy: Secondary | ICD-10-CM | POA: Diagnosis not present

## 2023-08-08 DIAGNOSIS — E875 Hyperkalemia: Secondary | ICD-10-CM | POA: Diagnosis not present

## 2023-08-08 DIAGNOSIS — R195 Other fecal abnormalities: Secondary | ICD-10-CM | POA: Diagnosis not present

## 2023-08-08 DIAGNOSIS — E1122 Type 2 diabetes mellitus with diabetic chronic kidney disease: Secondary | ICD-10-CM | POA: Diagnosis not present

## 2023-08-08 DIAGNOSIS — Z7984 Long term (current) use of oral hypoglycemic drugs: Secondary | ICD-10-CM | POA: Diagnosis not present

## 2023-08-08 DIAGNOSIS — N200 Calculus of kidney: Secondary | ICD-10-CM | POA: Diagnosis not present

## 2023-08-08 DIAGNOSIS — I714 Abdominal aortic aneurysm, without rupture, unspecified: Secondary | ICD-10-CM | POA: Diagnosis not present

## 2023-08-08 DIAGNOSIS — I129 Hypertensive chronic kidney disease with stage 1 through stage 4 chronic kidney disease, or unspecified chronic kidney disease: Secondary | ICD-10-CM | POA: Diagnosis not present

## 2023-08-08 DIAGNOSIS — N2889 Other specified disorders of kidney and ureter: Secondary | ICD-10-CM | POA: Diagnosis not present

## 2023-08-08 DIAGNOSIS — K5730 Diverticulosis of large intestine without perforation or abscess without bleeding: Secondary | ICD-10-CM | POA: Diagnosis not present

## 2023-08-08 DIAGNOSIS — R058 Other specified cough: Secondary | ICD-10-CM | POA: Diagnosis not present

## 2023-08-08 DIAGNOSIS — N183 Chronic kidney disease, stage 3 unspecified: Secondary | ICD-10-CM | POA: Diagnosis not present

## 2023-08-08 DIAGNOSIS — D696 Thrombocytopenia, unspecified: Secondary | ICD-10-CM | POA: Diagnosis not present

## 2023-08-08 DIAGNOSIS — Z66 Do not resuscitate: Secondary | ICD-10-CM | POA: Diagnosis not present

## 2023-08-08 DIAGNOSIS — N179 Acute kidney failure, unspecified: Secondary | ICD-10-CM | POA: Diagnosis not present

## 2023-08-08 DIAGNOSIS — G629 Polyneuropathy, unspecified: Secondary | ICD-10-CM | POA: Insufficient documentation

## 2023-08-08 DIAGNOSIS — D62 Acute posthemorrhagic anemia: Secondary | ICD-10-CM | POA: Diagnosis not present

## 2023-08-08 DIAGNOSIS — N132 Hydronephrosis with renal and ureteral calculous obstruction: Secondary | ICD-10-CM | POA: Diagnosis not present

## 2023-08-08 DIAGNOSIS — E872 Acidosis, unspecified: Secondary | ICD-10-CM | POA: Diagnosis not present

## 2023-08-08 DIAGNOSIS — K922 Gastrointestinal hemorrhage, unspecified: Secondary | ICD-10-CM | POA: Diagnosis not present

## 2023-08-08 DIAGNOSIS — Z7901 Long term (current) use of anticoagulants: Secondary | ICD-10-CM | POA: Diagnosis not present

## 2023-08-08 DIAGNOSIS — M1711 Unilateral primary osteoarthritis, right knee: Secondary | ICD-10-CM | POA: Diagnosis not present

## 2023-08-08 DIAGNOSIS — I480 Paroxysmal atrial fibrillation: Secondary | ICD-10-CM | POA: Diagnosis not present

## 2023-08-08 DIAGNOSIS — Z8679 Personal history of other diseases of the circulatory system: Secondary | ICD-10-CM | POA: Diagnosis not present

## 2023-08-08 DIAGNOSIS — R197 Diarrhea, unspecified: Secondary | ICD-10-CM | POA: Diagnosis not present

## 2023-08-08 DIAGNOSIS — E785 Hyperlipidemia, unspecified: Secondary | ICD-10-CM | POA: Diagnosis not present

## 2023-08-08 DIAGNOSIS — E1151 Type 2 diabetes mellitus with diabetic peripheral angiopathy without gangrene: Secondary | ICD-10-CM | POA: Diagnosis not present

## 2023-08-08 DIAGNOSIS — K644 Residual hemorrhoidal skin tags: Secondary | ICD-10-CM | POA: Diagnosis not present

## 2023-08-08 DIAGNOSIS — D649 Anemia, unspecified: Secondary | ICD-10-CM | POA: Diagnosis not present

## 2023-08-08 DIAGNOSIS — K3189 Other diseases of stomach and duodenum: Secondary | ICD-10-CM | POA: Diagnosis not present

## 2023-08-08 NOTE — Progress Notes (Signed)
 Established Patient Office Visit   Subjective:  Patient ID: Chelsea Dean, female    DOB: [birth year]  Age: 89 y.o. MRN: [id number]  Chief Complaint  Patient presents with   Leg Pain    Pt complains of burning in both lower legs. X 1 week.    Melena    Pt complains of black stool and diarrhea x 1 week. Left side pain. No NV, fever or chills.     HPI Encounter Diagnoses  Name Primary?   Gastrointestinal hemorrhage, unspecified gastrointestinal hemorrhage type Yes   Post-viral cough syndrome    Neuropathy    1 week history of dark diarrhea described as black.  Patient has been taking Imodium but no Pepto-Bismol.  There has been no abdominal pain, fever chills or nausea vomiting.  However she has experienced left lower back pain.  This pain is nonradiating.  History of diverticulosis.  No actual blood seen with the stool.  Accompanied by her daughter who is her primary caregiver who says that the patient just has not been herself.  She took antibiotics for a cough at the end of May.  She continues to experience a dry cough.  Over the last week she has been experiencing burning in her legs and feet when she goes to bed.   Review of Systems  Constitutional: Negative.  Negative for chills and fever.  HENT: Negative.    Eyes:  Negative for blurred vision, discharge and redness.  Respiratory: Negative.    Cardiovascular: Negative.   Gastrointestinal:  Positive for diarrhea and melena (?). Negative for abdominal pain, blood in stool, constipation, heartburn, nausea and vomiting.  Genitourinary: Negative.   Musculoskeletal: Negative.  Negative for myalgias.  Skin:  Negative for rash.  Neurological:  Negative for tingling, loss of consciousness and weakness.  Endo/Heme/Allergies:  Negative for polydipsia.     Current Outpatient Medications:    acetaminophen  (TYLENOL ) 650 MG CR tablet, Take 650-1,300 mg by mouth every 8 (eight) hours as needed for pain., Disp: , Rfl:    amLODipine   (NORVASC ) 10 MG tablet, TAKE 1 TABLET BY MOUTH EVERY DAY, Disp: 90 tablet, Rfl: 1   atorvastatin  (LIPITOR) 10 MG tablet, TAKE 1 TABLET BY MOUTH EVERY DAY, Disp: 90 tablet, Rfl: 3   benzonatate  (TESSALON ) 200 MG capsule, Take 1 capsule (200 mg total) by mouth 2 (two) times daily as needed for cough., Disp: 20 capsule, Rfl: 0   Cyanocobalamin  (B-12) 1000 MCG TABS, TAKE 1 TABLET BY MOUTH EVERY DAY, Disp: 100 tablet, Rfl: 0   diclofenac sodium (VOLTAREN) 1 % GEL, Apply 2 g topically daily as needed (KNEE PAIN)., Disp: , Rfl:    ELIQUIS  5 MG TABS tablet, TAKE 1 TABLET BY MOUTH 2 TIMES A DAY, Disp: 60 tablet, Rfl: 5   GNP VITAMIN B-12 1000 MCG TBCR, TAKE 1 TABLET BY MOUTH EVERY DAY, Disp: 90 tablet, Rfl: 1   Iron , Ferrous Sulfate , 325 (65 Fe) MG TABS, Take one twice daily by mouth if possible, Disp: 60 tablet, Rfl: 2   JARDIANCE  10 MG TABS tablet, TAKE 1 TABLET BY MOUTH EVERY MORNING BEFORE BREAKFAST, Disp: 90 tablet, Rfl: 0   losartan  (COZAAR ) 100 MG tablet, TAKE 1 TABLET BY MOUTH EVERY DAY, Disp: 90 tablet, Rfl: 1   Magnesium  250 MG CAPS, Take 250 mg by mouth daily., Disp: 90 capsule, Rfl: 2   Magnesium  Oxide (GNP MAGNESIUM  OXIDE) 250 MG TABS, TAKE 1 TABLET BY MOUTH EVERY DAY, Disp: 100 tablet, Rfl: 0  metoprolol  tartrate (LOPRESSOR ) 50 MG tablet, TAKE 1 TABLET BY MOUTH 2 TIMES A DAY, Disp: 180 tablet, Rfl: 3   Multiple Vitamin (MULTIVITAMIN) tablet, Take 1 tablet by mouth daily., Disp: , Rfl:    potassium chloride  SA (KLOR-CON  M) 20 MEQ tablet, Take 1 tablet (20 mEq total) by mouth every other day., Disp: 45 tablet, Rfl: 3   traMADol  (ULTRAM ) 50 MG tablet, TAKE 1 TABLET BY MOUTH EVERY 12 HOURS ASNEEDED FOR PAIN, Disp: 60 tablet, Rfl: 0   traZODone  (DESYREL ) 50 MG tablet, 1/2 po q hs prn sleep, Disp: 30 tablet, Rfl: 1   Objective:     BP 98/62 (Cuff Size: Normal)   Pulse 64   Temp (!) 96.4 F (35.8 C) (Temporal)   Ht 5' 2 (1.575 m)   Wt 138 lb 6.4 oz (62.8 kg)   SpO2 99%   BMI 25.31 kg/m   BP Readings from Last 3 Encounters:  08/08/23 98/62  07/26/23 114/72  04/12/23 104/64   Wt Readings from Last 3 Encounters:  08/08/23 138 lb 6.4 oz (62.8 kg)  07/26/23 140 lb 3.2 oz (63.6 kg)  04/12/23 139 lb 6.4 oz (63.2 kg)      Physical Exam Constitutional:      General: She is not in acute distress.    Appearance: Normal appearance. She is not ill-appearing, toxic-appearing or diaphoretic.  HENT:     Head: Normocephalic and atraumatic.     Right Ear: External ear normal.     Left Ear: External ear normal.     Mouth/Throat:     Mouth: Mucous membranes are moist.     Pharynx: Oropharynx is clear. No oropharyngeal exudate or posterior oropharyngeal erythema.   Eyes:     General: No scleral icterus.       Right eye: No discharge.        Left eye: No discharge.     Extraocular Movements: Extraocular movements intact.     Conjunctiva/sclera: Conjunctivae normal.     Comments: Conjunctiva are pale  Pulmonary:     Effort: Pulmonary effort is normal. No respiratory distress.     Breath sounds: No wheezing, rhonchi or rales.  Abdominal:     General: There is no distension.     Tenderness: There is no abdominal tenderness. There is guarding (mild guard at epigastrum). There is no right CVA tenderness or left CVA tenderness.  Genitourinary:    Rectum: Guaiac result positive. External hemorrhoid (anal tag noted) present. No mass, tenderness, anal fissure or internal hemorrhoid. Normal anal tone.     Comments:  Stool on glove was dark green with blood.  Skin:    General: Skin is warm and dry.   Neurological:     Mental Status: She is alert and oriented to person, place, and time.   Psychiatric:        Mood and Affect: Mood normal.        Behavior: Behavior normal.      No results found for any visits on 08/08/23.    The ASCVD Risk score (Arnett DK, et al., 2019) failed to calculate for the following reasons:   The 2019 ASCVD risk score is only valid for ages 51 to  30    Assessment & Plan:   Gastrointestinal hemorrhage, unspecified gastrointestinal hemorrhage type  Post-viral cough syndrome  Neuropathy    Return To ER now. Follow up with me in one to two weeks..  Concerned about source of GI bleed.  ER evaluation  tonight.  [name], MD

## 2023-08-12 ENCOUNTER — Telehealth: Payer: Self-pay

## 2023-08-12 NOTE — Transitions of Care (Post Inpatient/ED Visit) (Signed)
 08/12/2023  Name: Chelsea Dean MRN: [id number] DOB: Jun 30, 1927  Today's TOC FU Call Status: Today's TOC FU Call Status:: Successful TOC FU Call Completed TOC FU Call Complete Date: 08/12/23 Patient's Name and Date of Birth confirmed.  Transition Care Management Follow-up Telephone Call Date of Discharge: 08/11/23 Discharge Facility: Other [facility]) Name of Other (Non[facility]) Discharge Facility: [facility] Type of Discharge: Inpatient Admission Primary Inpatient Discharge Diagnosis:: GI bleed How have you been since you were released from the hospital?: Better Any questions or concerns?: No  Items Reviewed: Did you receive and understand the discharge instructions provided?: Yes Medications obtained,verified, and reconciled?: Yes (Medications Reviewed) Any new allergies since your discharge?: No Dietary orders reviewed?: Yes Do you have support at home?: Yes People in Home [RPT]: child(ren), adult  Medications Reviewed Today: Medications Reviewed Today     Reviewed by Emmitt Pan, LPN (Licensed Practical Nurse) on 08/12/23 at 1108  Med List Status: <None>   Medication Order Taking? Sig Documenting Provider Last Dose Status Informant  acetaminophen  (TYLENOL ) 650 MG CR tablet [id number] Yes Take 650-1,300 mg by mouth every 8 (eight) hours as needed for pain. [provider]  Active Child  amLODipine  (NORVASC ) 10 MG tablet [id number]  TAKE 1 TABLET BY MOUTH EVERY DAY  Patient not taking: Reported on 08/12/2023   Berneta Elsie Sayre, MD  Active   atorvastatin  (LIPITOR) 10 MG tablet [id number] Yes TAKE 1 TABLET BY MOUTH EVERY DAY Berneta Elsie Sayre, MD  Active   benzonatate  (TESSALON ) 200 MG capsule [id number] Yes Take 1 capsule (200 mg total) by mouth 2 (two) times daily as needed for cough. Berneta Elsie Sayre, MD  Active   Cyanocobalamin  (B-12) 1000 MCG TABS [id number] Yes TAKE 1 TABLET BY MOUTH EVERY DAY Berneta Elsie Sayre, MD  Active   diclofenac  sodium (VOLTAREN) 1 % GEL [id number] Yes Apply 2 g topically daily as needed (KNEE PAIN). [provider]  Active Child           Med Note JERALYN DUNCANS A   Wed Mar 26, 2020 11:11 AM)    ELIQUIS  5 MG TABS tablet [id number]  TAKE 1 TABLET BY MOUTH 2 TIMES A DAY  Patient not taking: Reported on 08/12/2023   Berneta Elsie Sayre, MD  Active   [facility] VITAMIN B-12 1000 MCG TBCR [id number] Yes TAKE 1 TABLET BY MOUTH EVERY DAY Berneta Elsie Sayre, MD  Active   Iron , Ferrous Sulfate , 325 (65 Fe) MG TABS [id number] Yes Take one twice daily by mouth if possible Berneta Elsie Sayre, MD  Active   JARDIANCE  10 MG TABS tablet [id number] Yes TAKE 1 TABLET BY MOUTH EVERY MORNING BEFORE BREAKFAST Berneta Elsie Sayre, MD  Active   losartan  (COZAAR ) 100 MG tablet [id number]  TAKE 1 TABLET BY MOUTH EVERY DAY  Patient not taking: Reported on 08/12/2023   Berneta Elsie Sayre, MD  Active   Magnesium  250 MG CAPS [id number] Yes Take 250 mg by mouth daily. Berneta Elsie Sayre, MD  Active   Magnesium  Oxide [facility] MAGNESIUM  OXIDE) 250 MG TABS [id number] Yes TAKE 1 TABLET BY MOUTH EVERY DAY Berneta Elsie Sayre, MD  Active   metoprolol  tartrate (LOPRESSOR ) 50 MG tablet [id number] Yes TAKE 1 TABLET BY MOUTH 2 TIMES A DAY Berneta Elsie Sayre, MD  Active   Multiple Vitamin (MULTIVITAMIN) tablet [id number] Yes Take 1 tablet by mouth daily. [provider]  Active   potassium chloride  SA (KLOR-CON  M) 20 MEQ tablet [id number] Yes Take 1 tablet (20 mEq  total) by mouth every other day. Berneta Elsie Sayre, MD  Active   traMADol  (ULTRAM ) 50 MG tablet [id number] Yes TAKE 1 TABLET BY MOUTH EVERY 12 HOURS ASNEEDED FOR PAIN Berneta Elsie Sayre, MD  Active Child  traZODone  (DESYREL ) 50 MG tablet [id number] Yes 1/2 po q hs prn sleep Berneta Elsie Sayre, MD  Active             Home Care and Equipment/Supplies: Were Home Health Services Ordered?: NA Any new equipment or medical supplies ordered?:  NA  Functional Questionnaire: Do you need assistance with bathing/showering or dressing?: Yes Do you need assistance with meal preparation?: Yes Do you need assistance with eating?: No Do you have difficulty maintaining continence: Yes Do you need assistance with getting out of bed/getting out of a chair/moving?: Yes Do you have difficulty managing or taking your medications?: Yes  Follow up appointments reviewed: PCP Follow-up appointment confirmed?: Yes Date of PCP follow-up appointment?: 08/15/23 Follow-up Provider: [facility] Follow-up appointment confirmed?: NA Do you need transportation to your follow-up appointment?: No Do you understand care options if your condition(s) worsen?: Yes-patient verbalized understanding    SIGNATURE Julian Lemmings, LPN [facility] Nurse Health Advisor Direct Dial [phone]

## 2023-08-15 ENCOUNTER — Encounter: Payer: Self-pay | Admitting: Family Medicine

## 2023-08-15 ENCOUNTER — Ambulatory Visit (INDEPENDENT_AMBULATORY_CARE_PROVIDER_SITE_OTHER): Admitting: Family Medicine

## 2023-08-15 ENCOUNTER — Ambulatory Visit: Payer: Self-pay | Admitting: Family Medicine

## 2023-08-15 ENCOUNTER — Telehealth: Payer: Self-pay

## 2023-08-15 VITALS — BP 98/62 | HR 61 | Temp 98.0°F | Ht 62.0 in | Wt 143.0 lb

## 2023-08-15 DIAGNOSIS — Z09 Encounter for follow-up examination after completed treatment for conditions other than malignant neoplasm: Secondary | ICD-10-CM | POA: Diagnosis not present

## 2023-08-15 DIAGNOSIS — G629 Polyneuropathy, unspecified: Secondary | ICD-10-CM

## 2023-08-15 LAB — CBC
HCT: 26 % — ABNORMAL LOW (ref 36.0–46.0)
Hemoglobin: 8 g/dL — CL (ref 12.0–15.0)
MCHC: 30.9 g/dL (ref 30.0–36.0)
MCV: 84.1 fl (ref 78.0–100.0)
Platelets: 104 10*3/uL — ABNORMAL LOW (ref 150.0–400.0)
RBC: 3.09 Mil/uL — ABNORMAL LOW (ref 3.87–5.11)
RDW: 18.6 % — ABNORMAL HIGH (ref 11.5–15.5)
WBC: 6.3 10*3/uL (ref 4.0–10.5)

## 2023-08-15 LAB — BASIC METABOLIC PANEL WITH GFR
BUN: 17 mg/dL (ref 6–23)
CO2: 24 meq/L (ref 19–32)
Calcium: 9.8 mg/dL (ref 8.4–10.5)
Chloride: 106 meq/L (ref 96–112)
Creatinine, Ser: 1.35 mg/dL — ABNORMAL HIGH (ref 0.40–1.20)
GFR: 33.7 mL/min — ABNORMAL LOW (ref >60.00)
Glucose, Bld: 85 mg/dL (ref 70–99)
Potassium: 3.8 meq/L (ref 3.5–5.1)
Sodium: 138 meq/L (ref 135–145)

## 2023-08-15 NOTE — Telephone Encounter (Signed)
 CRITICAL VALUE STICKER  CRITICAL VALUE: Hgb 8.0  RECEIVER (on-site recipient of call): Dedra  DATE & TIME NOTIFIED: 08/15/23 1311  MESSENGER (representative from lab): Darice  MD NOTIFIED: Berneta  TIME OF NOTIFICATION: 08/15/23 1324  RESPONSE:  Provider aware

## 2023-08-15 NOTE — Progress Notes (Signed)
 Established Patient Office Visit   Subjective:  Patient ID: Chelsea Dean, female    DOB: 11/14/1927  Age: 89 y.o. MRN: [id number]  Chief Complaint  Patient presents with   Hospitalization Follow-up    Dark stool still    HPI Encounter Diagnoses  Name Primary?   Hospital discharge follow-up Yes   For hospital discharge follow-up status post acute GI bleed.  Upper GI was performed that was reported to be normal.  She was given a unit of blood.  Eliquis  losartan  and amlodipine  were all discontinued.  Colonoscopy was considered but she would be a poor candidate for that procedure.  She has been taking Eliquis  for stroke prevention with history of atrial fibs.  It was reasonably believed that the Eliquis  was likely part of the reason for her bleed.   Review of Systems  Constitutional: Negative.   HENT: Negative.    Eyes:  Negative for blurred vision, discharge and redness.  Respiratory: Negative.    Cardiovascular: Negative.   Gastrointestinal:  Negative for abdominal pain.  Genitourinary: Negative.   Musculoskeletal: Negative.  Negative for myalgias.  Skin:  Negative for rash.  Neurological:  Negative for tingling, loss of consciousness and weakness.  Endo/Heme/Allergies:  Negative for polydipsia.     Current Outpatient Medications:    acetaminophen  (TYLENOL ) 650 MG CR tablet, Take 650-1,300 mg by mouth every 8 (eight) hours as needed for pain., Disp: , Rfl:    atorvastatin  (LIPITOR) 10 MG tablet, TAKE 1 TABLET BY MOUTH EVERY DAY, Disp: 90 tablet, Rfl: 3   benzonatate  (TESSALON ) 200 MG capsule, Take 1 capsule (200 mg total) by mouth 2 (two) times daily as needed for cough., Disp: 20 capsule, Rfl: 0   Cyanocobalamin  (B-12) 1000 MCG TABS, TAKE 1 TABLET BY MOUTH EVERY DAY, Disp: 100 tablet, Rfl: 0   diclofenac sodium (VOLTAREN) 1 % GEL, Apply 2 g topically daily as needed (KNEE PAIN)., Disp: , Rfl:    Iron , Ferrous Sulfate , 325 (65 Fe) MG TABS, Take one twice daily by mouth if  possible, Disp: 60 tablet, Rfl: 2   JARDIANCE  10 MG TABS tablet, TAKE 1 TABLET BY MOUTH EVERY MORNING BEFORE BREAKFAST, Disp: 90 tablet, Rfl: 0   Magnesium  250 MG CAPS, Take 250 mg by mouth daily., Disp: 90 capsule, Rfl: 2   Magnesium  Oxide (GNP MAGNESIUM  OXIDE) 250 MG TABS, TAKE 1 TABLET BY MOUTH EVERY DAY, Disp: 100 tablet, Rfl: 0   Multiple Vitamin (MULTIVITAMIN) tablet, Take 1 tablet by mouth daily., Disp: , Rfl:    potassium chloride  SA (KLOR-CON  M) 20 MEQ tablet, Take 1 tablet (20 mEq total) by mouth every other day., Disp: 45 tablet, Rfl: 3   traMADol  (ULTRAM ) 50 MG tablet, TAKE 1 TABLET BY MOUTH EVERY 12 HOURS ASNEEDED FOR PAIN, Disp: 60 tablet, Rfl: 0   amLODipine  (NORVASC ) 10 MG tablet, TAKE 1 TABLET BY MOUTH EVERY DAY (Patient not taking: Reported on 08/12/2023), Disp: 90 tablet, Rfl: 1   ELIQUIS  5 MG TABS tablet, TAKE 1 TABLET BY MOUTH 2 TIMES A DAY (Patient not taking: Reported on 08/12/2023), Disp: 60 tablet, Rfl: 5   GNP VITAMIN B-12 1000 MCG TBCR, TAKE 1 TABLET BY MOUTH EVERY DAY (Patient not taking: Reported on 08/15/2023), Disp: 90 tablet, Rfl: 1   losartan  (COZAAR ) 100 MG tablet, TAKE 1 TABLET BY MOUTH EVERY DAY (Patient not taking: Reported on 08/12/2023), Disp: 90 tablet, Rfl: 1   metoprolol  tartrate (LOPRESSOR ) 50 MG tablet, TAKE 1 TABLET BY MOUTH 2  TIMES A DAY (Patient not taking: Reported on 08/15/2023), Disp: 180 tablet, Rfl: 3   traZODone  (DESYREL ) 50 MG tablet, 1/2 po q hs prn sleep (Patient not taking: Reported on 08/15/2023), Disp: 30 tablet, Rfl: 1   Objective:     BP 98/62 (BP Location: Right Arm, Patient Position: Sitting)   Pulse 61   Temp 98 F (36.7 C) (Oral)   Ht 5' 2 (1.575 m)   Wt 143 lb (64.9 kg)   SpO2 97%   BMI 26.16 kg/m    Physical Exam Constitutional:      General: She is not in acute distress.    Appearance: Normal appearance. She is not ill-appearing, toxic-appearing or diaphoretic.  HENT:     Head: Normocephalic and atraumatic.     Right  Ear: External ear normal.     Left Ear: External ear normal.     Mouth/Throat:     Mouth: Mucous membranes are moist.     Pharynx: Oropharynx is clear. No oropharyngeal exudate or posterior oropharyngeal erythema.   Eyes:     General: No scleral icterus.       Right eye: No discharge.        Left eye: No discharge.     Extraocular Movements: Extraocular movements intact.     Conjunctiva/sclera: Conjunctivae normal.    Cardiovascular:     Rate and Rhythm: Normal rate. Rhythm irregularly irregular.  Pulmonary:     Effort: Pulmonary effort is normal. No respiratory distress.     Breath sounds: Normal breath sounds. No wheezing or rales.  Abdominal:     General: Bowel sounds are normal.     Tenderness: There is no abdominal tenderness. There is no guarding or rebound.   Musculoskeletal:     Cervical back: No rigidity or tenderness.   Skin:    General: Skin is warm and dry.   Neurological:     Mental Status: She is alert and oriented to person, place, and time.   Psychiatric:        Mood and Affect: Mood normal.        Behavior: Behavior normal.      No results found for any visits on 08/15/23.    The ASCVD Risk score (Arnett DK, et al., 2019) failed to calculate for the following reasons:   The 2019 ASCVD risk score is only valid for ages 73 to 60    Assessment & Plan:   Hospital discharge follow-up -     CBC -     Basic metabolic panel with GFR    Return in about 4 weeks (around 09/12/2023).  Will continue to hold Eliquis , losartan  and amlodipine .  Discussed this with Jonette her daughter.  And we both agreed this was necessary.  She will be at higher risk for stroke.  [name], MD

## 2023-08-16 NOTE — Telephone Encounter (Signed)
-----   Message from [facility] S sent at 08/16/2023  8:42 AM EDT ----- Please call pt ----- Message ----- From: Berneta Elsie Sayre, MD Sent: 08/15/2023   2:50 PM EDT To: Flonnie CHRISTELLA Norse, CMA  Hemoglobin remains low.  It is consistent with the recent results obtained during your hospital visit. ----- Message ----- From: Interface, Lab In Three [facility] One Sent: 08/15/2023   1:12 PM EDT To: Elsie Sayre Berneta, MD

## 2023-08-18 NOTE — Telephone Encounter (Signed)
 Copied from CRM [id number]. Topic: Clinical - Home Health Verbal Orders >> Aug 18, 2023  4:19 PM Thersia BROCKS wrote: Caller/Agency: [facility] [place] Number: [phone] Service Requested: Pallative Care Services  Frequency: PRN  Any new concerns about the patient? No

## 2023-08-25 MED ORDER — GABAPENTIN 100 MG PO CAPS: 100.0000 mg | ORAL_CAPSULE | Freq: Every day | ORAL | 3 refills | Status: AC

## 2023-08-31 DIAGNOSIS — I129 Hypertensive chronic kidney disease with stage 1 through stage 4 chronic kidney disease, or unspecified chronic kidney disease: Secondary | ICD-10-CM | POA: Diagnosis not present

## 2023-08-31 DIAGNOSIS — D631 Anemia in chronic kidney disease: Secondary | ICD-10-CM | POA: Diagnosis not present

## 2023-08-31 DIAGNOSIS — E876 Hypokalemia: Secondary | ICD-10-CM | POA: Diagnosis not present

## 2023-08-31 DIAGNOSIS — N184 Chronic kidney disease, stage 4 (severe): Secondary | ICD-10-CM | POA: Diagnosis not present

## 2023-09-09 ENCOUNTER — Ambulatory Visit

## 2023-09-09 DIAGNOSIS — Z0000 Encounter for general adult medical examination without abnormal findings: Secondary | ICD-10-CM

## 2023-09-09 NOTE — Patient Instructions (Signed)
 Ms. Chelsea Dean , Thank you for taking time out of your busy schedule to complete your Annual Wellness Visit with me. I enjoyed our conversation and look forward to speaking with you again next year. I, as well as your care team,  appreciate your ongoing commitment to your health goals. Please review the following plan we discussed and let me know if I can assist you in the future. Your Game plan/ To Do List    Referrals: If you haven't heard from the office you've been referred to, please reach out to them at the phone provided.  N/a Follow up Visits: Next Medicare AWV with our clinical staff: 09/14/2024 at 4:20   Have you seen your provider in the last 6 months (3 months if uncontrolled diabetes)? Yes Next Office Visit with your provider: 09/12/2023 at 2:20  Clinician Recommendations:  Aim for 30 minutes of exercise or brisk walking, 6-8 glasses of water, and 5 servings of fruits and vegetables each day.       This is a list of the screening recommended for you and due dates:  Health Maintenance  Topic Date Due   DEXA scan (bone density measurement)  Never done   Complete foot exam   06/06/2021   Eye exam for diabetics  08/20/2022   Flu Shot  09/16/2023   Hemoglobin A1C  01/25/2024   Medicare Annual Wellness Visit  09/08/2024   Pneumococcal Vaccine for age over 31  Completed   Hepatitis B Vaccine  Aged Out   HPV Vaccine  Aged Out   Meningitis B Vaccine  Aged Out   DTaP/Tdap/Td vaccine  Discontinued   COVID-19 Vaccine  Discontinued   Zoster (Shingles) Vaccine  Discontinued    Advanced directives: (In Chart) A copy of your advanced directives are scanned into your chart should your provider ever need it. Advance Care Planning is important because it:  [x]  Makes sure you receive the medical care that is consistent with your values, goals, and preferences  [x]  It provides guidance to your family and loved ones and reduces their decisional burden about whether or not they are making the  right decisions based on your wishes.  Follow the link provided in your after visit summary or read over the paperwork we have mailed to you to help you started getting your Advance Directives in place. If you need assistance in completing these, please reach out to us  so that we can help you!  See attachments for Preventive Care and Fall Prevention Tips.

## 2023-09-09 NOTE — Progress Notes (Signed)
 Subjective:   Fatina Sprankle is a 89 y.o. who presents for a Medicare Wellness preventive visit.  As a reminder, Annual Wellness Visits don't include a physical exam, and some assessments may be limited, especially if this visit is performed virtually. We may recommend an in-person follow-up visit with your provider if needed.  Visit Complete: Virtual I connected with  Cathlean Bihari on 09/09/23 by a audio enabled telemedicine application and verified that I am speaking with the correct person using two identifiers.  Patient Location: Home  Provider Location: Office/Clinic  I discussed the limitations of evaluation and management by telemedicine. The patient expressed understanding and agreed to proceed.  Vital Signs: Because this visit was a virtual/telehealth visit, some criteria may be missing or patient reported. Any vitals not documented were not able to be obtained and vitals that have been documented are patient reported.  VideoError- Librarian, academic were attempted between this provider and patient, however failed, due to patient having technical difficulties OR patient did not have access to video capability.  We continued and completed visit with audio only.   Persons Participating in Visit: Patient assisted by daughter.  AWV Questionnaire: No: Patient Medicare AWV questionnaire was not completed prior to this visit.  Cardiac Risk Factors include: advanced age (>25men, >45 women);diabetes mellitus;dyslipidemia;hypertension     Objective:    Today's Vitals   There is no height or weight on file to calculate BMI.     09/09/2023    4:19 PM 08/30/2022    2:27 PM 09/03/2021    9:23 AM 11/24/2020    4:58 PM 11/24/2020    1:19 PM 11/21/2020    7:17 PM 11/21/2020    7:05 PM  Advanced Directives  Does Patient Have a Medical Advance Directive? Yes Yes Yes Yes Yes Yes Yes  Type of Advance Directive Out of facility DNR (pink MOST or yellow form) Out  of facility DNR (pink MOST or yellow form) Healthcare Power of [employer] of [place];Living will Healthcare Power of [employer] of [place];Living will   Does patient want to make changes to medical advance directive?   No - Patient declined No - Patient declined Yes (ED - Information included in AVS) No - Patient declined   Copy of Healthcare Power of Attorney in Chart?   No - copy requested No - copy requested  No - copy requested     Current Medications (verified) Outpatient Encounter Medications as of 09/09/2023  Medication Sig   acetaminophen  (TYLENOL ) 650 MG CR tablet Take 650-1,300 mg by mouth every 8 (eight) hours as needed for pain.   atorvastatin  (LIPITOR) 10 MG tablet TAKE 1 TABLET BY MOUTH EVERY DAY   benzonatate  (TESSALON ) 200 MG capsule Take 1 capsule (200 mg total) by mouth 2 (two) times daily as needed for cough.   Cyanocobalamin  (B-12) 1000 MCG TABS TAKE 1 TABLET BY MOUTH EVERY DAY   diclofenac sodium (VOLTAREN) 1 % GEL Apply 2 g topically daily as needed (KNEE PAIN).   gabapentin  (NEURONTIN ) 100 MG capsule Take 1 capsule (100 mg total) by mouth at bedtime.   Iron , Ferrous Sulfate , 325 (65 Fe) MG TABS Take one twice daily by mouth if possible   JARDIANCE  10 MG TABS tablet TAKE 1 TABLET BY MOUTH EVERY MORNING BEFORE BREAKFAST   Magnesium  250 MG CAPS Take 250 mg by mouth daily.   Magnesium  Oxide (GNP MAGNESIUM  OXIDE) 250 MG TABS TAKE 1 TABLET BY MOUTH EVERY DAY  Multiple Vitamin (MULTIVITAMIN) tablet Take 1 tablet by mouth daily.   potassium chloride  SA (KLOR-CON  M) 20 MEQ tablet Take 1 tablet (20 mEq total) by mouth every other day.   traMADol  (ULTRAM ) 50 MG tablet TAKE 1 TABLET BY MOUTH EVERY 12 HOURS ASNEEDED FOR PAIN   amLODipine  (NORVASC ) 10 MG tablet TAKE 1 TABLET BY MOUTH EVERY DAY (Patient not taking: Reported on 09/09/2023)   ELIQUIS  5 MG TABS tablet TAKE 1 TABLET BY MOUTH 2 TIMES A DAY (Patient not taking: Reported on 09/09/2023)   GNP  VITAMIN B-12 1000 MCG TBCR TAKE 1 TABLET BY MOUTH EVERY DAY (Patient not taking: Reported on 08/15/2023)   losartan  (COZAAR ) 100 MG tablet TAKE 1 TABLET BY MOUTH EVERY DAY (Patient not taking: Reported on 09/09/2023)   metoprolol  tartrate (LOPRESSOR ) 50 MG tablet TAKE 1 TABLET BY MOUTH 2 TIMES A DAY (Patient not taking: Reported on 09/09/2023)   traZODone  (DESYREL ) 50 MG tablet 1/2 po q hs prn sleep (Patient not taking: Reported on 09/09/2023)   No facility-administered encounter medications on file as of 09/09/2023.    Allergies (verified) Ace inhibitors, Contrast media [iodinated contrast media], and Nsaids   History: Past Medical History:  Diagnosis Date   A-fib (HCC) 10/19/2016   Abdominal aortic aneurysm (AAA) without rupture (HCC) 09/15/2013   Acute idiopathic gout of right foot    AKI (acute kidney injury) (HCC) 10/18/2016   Anticoagulated on Coumadin     Arthritis    Diabetes mellitus without complication (HCC)    Diverticular disease of large intestine 04/16/2013   Diverticulosis    DM2 (diabetes mellitus, type 2) (HCC) 10/19/2016   Dysrhythmia    Atrial fib   History of colon polyps 04/16/2013   Hx of colonic polyps    Hyperlipidemia    Hypertension    Long term current use of anticoagulant therapy 02/05/2015   Lumbar radiculopathy 03/28/2014   Microalbuminuria 04/16/2013   Osteoarthritis 04/16/2013   Osteoarthritis of cervical spine 03/28/2014   Pelvic mass 10/19/2016   Right renal mass    SBO (small bowel obstruction) (HCC) 10/18/2016   Thrombocytopenia (HCC) 04/16/2013   Overview:  referred to hematology 01/2104   Past Surgical History:  Procedure Laterality Date   ABDOMINAL AORTIC ANEURYSM REPAIR     ABDOMINAL HYSTERECTOMY     APPLICATION OF WOUND VAC Left 11/26/2020   Procedure: APPLICATION OF WOUND VAC;  Surgeon: Magda Debby SAILOR, MD;  Location: [facility] OR;  Service: Vascular;  Laterality: Left;   INCISION AND DRAINAGE Left 11/26/2020   Procedure: INCISION AND  DRAINAGE OF LEFT LEG;  Surgeon: Magda Debby SAILOR, MD;  Location: [facility] OR;  Service: Vascular;  Laterality: Left;   IR ANGIOGRAM EXTREMITY LEFT  04/01/2020   IR ANGIOGRAM PELVIS SELECTIVE OR SUPRASELECTIVE  04/01/2020   IR ANGIOGRAM SELECTIVE EACH ADDITIONAL VESSEL  04/01/2020   IR ANGIOGRAM SELECTIVE EACH ADDITIONAL VESSEL  04/01/2020   IR ANGIOGRAM SELECTIVE EACH ADDITIONAL VESSEL  04/01/2020   IR AORTAGRAM ABDOMINAL SERIALOGRAM  04/01/2020   IR EMBO ARTERIAL NOT HEMORR HEMANG INC GUIDE ROADMAPPING  04/01/2020   IR GENERIC HISTORICAL  07/01/2015   IR RADIOLOGIST EVAL & MGMT 07/01/2015 [facility] INTERV RAD   IR RADIOLOGIST EVAL & MGMT  03/11/2020   IR RADIOLOGIST EVAL & MGMT  04/30/2020   IR RADIOLOGIST EVAL & MGMT  06/22/2021   IR THROMBECT PRIM MECH INIT (INCLU) MOD SED  04/01/2020   IR US  GUIDE VASC ACCESS LEFT  04/01/2020   IR US  GUIDE  VASC ACCESS LEFT  04/01/2020   IR US  GUIDE VASC ACCESS RIGHT  04/01/2020   THROMBECTOMY FEMORAL ARTERY Left 04/01/2020   Procedure: THROMBECTOMY LEFT POPLITEAL ARTERY;  Surgeon: Oris Krystal FALCON, MD;  Location: [facility] OR;  Service: Vascular;  Laterality: Left;   Family History  Problem Relation Age of Onset   Cancer Sister        Rectal and Stomach   Social History   Socioeconomic History   Marital status: Married    Spouse name: Not on file   Number of children: Not on file   Years of education: Not on file   Highest education level: Not on file  Occupational History   Occupation: retired [employer]  Tobacco Use   Smoking status: Never   Smokeless tobacco: Former    Types: Snuff    Quit date: 2018  Vaping Use   Vaping status: Never Used  Substance and Sexual Activity   Alcohol use: No    Alcohol/week: 0.0 standard drinks of alcohol   Drug use: No   Sexual activity: Not Currently  Other Topics Concern   Not on file  Social History Narrative   Admitted to [facility] 10/27/16   Married   Use snuff   Alcohol none   DNR   Social Drivers of  Health   Financial Resource Strain: Low Risk  (09/09/2023)   Overall Financial Resource Strain (CARDIA)    Difficulty of Paying Living Expenses: Not hard at all  Food Insecurity: No Food Insecurity (09/09/2023)   Hunger Vital Sign    Worried About Running Out of Food in the Last Year: Never true    Ran Out of Food in the Last Year: Never true  Transportation Needs: No Transportation Needs (09/09/2023)   PRAPARE - Administrator, Civil Service (Medical): No    Lack of Transportation (Non-Medical): No  Physical Activity: Insufficiently Active (09/09/2023)   Exercise Vital Sign    Days of Exercise per Week: 7 days    Minutes of Exercise per Session: 10 min  Stress: No Stress Concern Present (09/09/2023)   [employer] of Occupational Health - Occupational Stress Questionnaire    Feeling of Stress: Not at all  Social Connections: Unknown (09/09/2023)   Social Connection and Isolation Panel    Frequency of Communication with Friends and Family: Never    Frequency of Social Gatherings with Friends and Family: Not on file    Attends Religious Services: Never    Database administrator or Organizations: No    Attends Banker Meetings: Never    Marital Status: Widowed    Tobacco Counseling Counseling given: Not Answered    Clinical Intake:  Pre-visit preparation completed: Yes  Pain : No/denies pain     Nutritional Risks: None Diabetes: Yes CBG done?: No Did pt. bring in CBG monitor from home?: No  Lab Results  Component Value Date   HGBA1C 6.5 07/26/2023   HGBA1C 6.7 (H) 04/12/2023   HGBA1C 6.5 07/26/2022     How often do you need to have someone help you when you read instructions, pamphlets, or other written materials from your doctor or pharmacy?: 1 - Never  Interpreter Needed?: No  Information entered by :: NAllen LPN   Activities of Daily Living     09/09/2023    4:13 PM  In your present state of health, do you have any difficulty  performing the following activities:  Hearing? 1  Comment has hearing aids  Vision? 0  Difficulty concentrating or making decisions? 1  Walking or climbing stairs? 1  Dressing or bathing? 1  Doing errands, shopping? 1  Preparing Food and eating ? Y  Using the Toilet? N  In the past six months, have you accidently leaked urine? N  Do you have problems with loss of bowel control? N  Managing your Medications? Y  Managing your Finances? Y  Housekeeping or managing your Housekeeping? Y    Patient Care Team: Berneta Elsie Sayre, MD as PCP - General (Family Medicine)  I have updated your Care Teams any recent Medical Services you may have received from other providers in the past year.     Assessment:   This is a routine wellness examination for Katieann.  Hearing/Vision screen Hearing Screening - Comments:: Has hearing aids Vision Screening - Comments:: Regular eye exams, [facility]   Goals Addressed             This Visit's Progress    Patient Stated       09/09/2023, daughter would like her to walk more       Depression Screen     09/09/2023    4:23 PM 08/30/2022    2:29 PM 07/26/2022    1:17 PM 01/20/2022    9:54 AM 10/21/2021   10:53 AM 09/03/2021    9:39 AM 08/06/2021    9:06 AM  PHQ 2/9 Scores  PHQ - 2 Score 0 0 0 0 0 0 0  PHQ- 9 Score  0         Fall Risk     09/09/2023    4:22 PM 08/15/2023   10:13 AM 07/26/2023    8:42 AM 08/30/2022    2:28 PM 07/26/2022    1:17 PM  Fall Risk   Falls in the past year? 0 0 0 1 0  Comment    foot got caught   Number falls in past yr: 0 0 0 0 1  Injury with Fall? 0 0 0 0 0  Risk for fall due to : Medication side effect;Impaired mobility;Impaired balance/gait No Fall Risks No Fall Risks Medication side effect;Impaired mobility;Impaired balance/gait History of fall(s)  Follow up Falls evaluation completed;Falls prevention discussed Falls evaluation completed Falls evaluation completed Falls evaluation completed;Falls prevention  discussed Falls evaluation completed    MEDICARE RISK AT HOME:  Medicare Risk at Home Any stairs in or around the home?: Yes (ramp) If so, are there any without handrails?: No Home free of loose throw rugs in walkways, pet beds, electrical cords, etc?: Yes Adequate lighting in your home to reduce risk of falls?: Yes Life alert?: No Use of a cane, walker or w/c?: Yes Grab bars in the bathroom?: Yes Shower chair or bench in shower?: Yes Elevated toilet seat or a handicapped toilet?: Yes  TIMED UP AND GO:  Was the test performed?  No  Cognitive Function: Unable: Due to language barrier, hearing or vision limitations or other daughter says her memory has declined and can not complete    05/09/2019    2:43 PM  MMSE - Mini Mental State Exam  Not completed: Refused;Unable to complete        08/30/2022    2:30 PM 09/03/2021    9:25 AM  6CIT Screen  What Year? 4 points 0 points  What month? 0 points 0 points  What time? 0 points 0 points  Count back from 20 4 points 0 points  Months  in reverse 4 points 0 points  Repeat phrase 10 points 0 points  Total Score 22 points 0 points    Immunizations Immunization History  Administered Date(s) Administered   Fluad Quad(high Dose 65+) 12/05/2018, 12/05/2019, 11/24/2020   Fluad Trivalent(High Dose 65+) 01/06/2023   Influenza, High Dose Seasonal PF 01/17/2014, 10/23/2014, 12/28/2017, 01/20/2022   Influenza,inj,Quad PF,6+ Mos 11/21/2015   Influenza-Unspecified 01/02/2013, 02/23/2013, 01/17/2014, 10/23/2014   Moderna Sars-Covid-2 Vaccination 04/17/2019, 05/15/2019   PPD Test 10/27/2016   Pneumococcal Conjugate-13 10/23/2014   Pneumococcal Polysaccharide-23 08/29/2007   RSV,unspecified 02/11/2022   Zoster Recombinant(Shingrix) 08/06/2021, 01/20/2022    Screening Tests Health Maintenance  Topic Date Due   DEXA SCAN  Never done   FOOT EXAM  06/06/2021   OPHTHALMOLOGY EXAM  08/20/2022   INFLUENZA VACCINE  09/16/2023   HEMOGLOBIN A1C   01/25/2024   Medicare Annual Wellness (AWV)  09/08/2024   Pneumococcal Vaccine: 50+ Years  Completed   Hepatitis B Vaccines  Aged Out   HPV VACCINES  Aged Out   Meningococcal B Vaccine  Aged Out   DTaP/Tdap/Td  Discontinued   COVID-19 Vaccine  Discontinued   Zoster Vaccines- Shingrix  Discontinued    Health Maintenance  Health Maintenance Due  Topic Date Due   DEXA SCAN  Never done   FOOT EXAM  06/06/2021   OPHTHALMOLOGY EXAM  08/20/2022   Health Maintenance Items Addressed: Declines bone density. Requesting eye exam  Additional Screening:  Vision Screening: Recommended annual ophthalmology exams for early detection of glaucoma and other disorders of the eye. Would you like a referral to an eye doctor? No    Dental Screening: Recommended annual dental exams for proper oral hygiene  Community Resource Referral / Chronic Care Management: CRR required this visit?  No   CCM required this visit?  No   Plan:    I have personally reviewed and noted the following in the patient's chart:   Medical and social history Use of alcohol, tobacco or illicit drugs  Current medications and supplements including opioid prescriptions. Patient is not currently taking opioid prescriptions. Functional ability and status Nutritional status Physical activity Advanced directives List of other physicians Hospitalizations, surgeries, and ER visits in previous 12 months Vitals Screenings to include cognitive, depression, and falls Referrals and appointments  In addition, I have reviewed and discussed with patient certain preventive protocols, quality metrics, and best practice recommendations. A written personalized care plan for preventive services as well as general preventive health recommendations were provided to patient.   Ardella FORBES Dawn, LPN   2/74/7974   After Visit Summary: (Pick Up) Due to this being a telephonic visit, with patients personalized plan was offered to patient  and patient has requested to Pick up at office.  Notes: Nothing significant to report at this time.

## 2023-09-12 ENCOUNTER — Encounter: Payer: Self-pay | Admitting: Family Medicine

## 2023-09-12 ENCOUNTER — Ambulatory Visit (INDEPENDENT_AMBULATORY_CARE_PROVIDER_SITE_OTHER): Admitting: Family Medicine

## 2023-09-12 VITALS — BP 98/62 | HR 65 | Temp 97.3°F | Ht 62.0 in | Wt 137.8 lb

## 2023-09-12 DIAGNOSIS — D509 Iron deficiency anemia, unspecified: Secondary | ICD-10-CM

## 2023-09-12 DIAGNOSIS — N1832 Chronic kidney disease, stage 3b: Secondary | ICD-10-CM | POA: Diagnosis not present

## 2023-09-12 NOTE — Progress Notes (Signed)
 Established Patient Office Visit   Subjective:  Patient ID: Chelsea Dean, female    DOB: 05-30-1927  Age: 89 y.o. MRN: [id number]  Chief Complaint  Patient presents with   Medical Management of Chronic Issues    4 week follow up did go kidney specialist  July 13    HPI Encounter Diagnoses  Name Primary?   Stage 3b chronic kidney disease (HCC) Yes   Microcytic anemia    No further dark tarry stools or hematochezia after stopping the Eliquis .  Status post renal consult.  Would like to minimize doctors visits.   Review of Systems  Constitutional: Negative.   HENT:  Positive for hearing loss.   Eyes:  Negative for blurred vision, discharge and redness.  Respiratory: Negative.    Cardiovascular: Negative.   Gastrointestinal:  Negative for abdominal pain.  Genitourinary: Negative.   Musculoskeletal: Negative.  Negative for myalgias.  Skin:  Negative for rash.  Neurological:  Negative for tingling, loss of consciousness and weakness.  Endo/Heme/Allergies:  Negative for polydipsia.     Current Outpatient Medications:    acetaminophen  (TYLENOL ) 650 MG CR tablet, Take 650-1,300 mg by mouth every 8 (eight) hours as needed for pain., Disp: , Rfl:    benzonatate  (TESSALON ) 200 MG capsule, Take 1 capsule (200 mg total) by mouth 2 (two) times daily as needed for cough., Disp: 20 capsule, Rfl: 0   Cyanocobalamin  (B-12) 1000 MCG TABS, TAKE 1 TABLET BY MOUTH EVERY DAY, Disp: 100 tablet, Rfl: 0   diclofenac sodium (VOLTAREN) 1 % GEL, Apply 2 g topically daily as needed (KNEE PAIN)., Disp: , Rfl:    gabapentin  (NEURONTIN ) 100 MG capsule, Take 1 capsule (100 mg total) by mouth at bedtime., Disp: 30 capsule, Rfl: 3   Iron , Ferrous Sulfate , 325 (65 Fe) MG TABS, Take one twice daily by mouth if possible, Disp: 60 tablet, Rfl: 2   JARDIANCE  10 MG TABS tablet, TAKE 1 TABLET BY MOUTH EVERY MORNING BEFORE BREAKFAST, Disp: 90 tablet, Rfl: 0   Magnesium  250 MG CAPS, Take 250 mg by mouth daily.,  Disp: 90 capsule, Rfl: 2   Magnesium  Oxide (GNP MAGNESIUM  OXIDE) 250 MG TABS, TAKE 1 TABLET BY MOUTH EVERY DAY, Disp: 100 tablet, Rfl: 0   Multiple Vitamin (MULTIVITAMIN) tablet, Take 1 tablet by mouth daily., Disp: , Rfl:    potassium chloride  SA (KLOR-CON  M) 20 MEQ tablet, Take 1 tablet (20 mEq total) by mouth every other day., Disp: 45 tablet, Rfl: 3   traMADol  (ULTRAM ) 50 MG tablet, TAKE 1 TABLET BY MOUTH EVERY 12 HOURS ASNEEDED FOR PAIN, Disp: 60 tablet, Rfl: 0   GNP VITAMIN B-12 1000 MCG TBCR, TAKE 1 TABLET BY MOUTH EVERY DAY (Patient not taking: Reported on 08/15/2023), Disp: 90 tablet, Rfl: 1   Objective:     BP 98/62 (BP Location: Right Arm, Patient Position: Sitting, Cuff Size: Normal)   Pulse 65   Temp (!) 97.3 F (36.3 C) (Temporal)   Ht 5' 2 (1.575 m)   Wt 137 lb 12.8 oz (62.5 kg)   SpO2 97%   BMI 25.20 kg/m    Physical Exam Constitutional:      General: She is not in acute distress.    Appearance: Normal appearance. She is not ill-appearing, toxic-appearing or diaphoretic.  HENT:     Head: Normocephalic and atraumatic.     Right Ear: External ear normal.     Left Ear: External ear normal.  Eyes:     General: No  scleral icterus.       Right eye: No discharge.        Left eye: No discharge.     Extraocular Movements: Extraocular movements intact.     Conjunctiva/sclera: Conjunctivae normal.  Cardiovascular:     Rate and Rhythm: Normal rate. Rhythm irregularly irregular.  Pulmonary:     Effort: Pulmonary effort is normal. No respiratory distress.     Breath sounds: No wheezing or rales.  Skin:    General: Skin is warm and dry.  Neurological:     Mental Status: She is alert and oriented to person, place, and time.  Psychiatric:        Mood and Affect: Mood normal.        Behavior: Behavior normal.      No results found for any visits on 09/12/23.    The ASCVD Risk score (Arnett DK, et al., 2019) failed to calculate for the following reasons:   The  2019 ASCVD risk score is only valid for ages 50 to 53    Assessment & Plan:   Stage 3b chronic kidney disease (HCC)  Microcytic anemia    Return in about 8 weeks (around 11/07/2023), or We have stopped the amlodipine , losartan , metoprolol  and atorvastatin ..    [name], MD

## 2023-09-13 NOTE — Telephone Encounter (Unsigned)
 Copied from CRM [id number]. Topic: Clinical - Home Health Verbal Orders >> Sep 13, 2023  1:43 PM Thersia BROCKS wrote: Caller/Agency: [facility] Number: [phone] Service Requested: Start of Palliative Care Services  Frequency: PRN  Any new concerns about the patient? No

## 2023-09-14 ENCOUNTER — Encounter: Payer: Self-pay | Admitting: Family Medicine

## 2023-09-20 ENCOUNTER — Other Ambulatory Visit: Payer: Self-pay | Admitting: Family Medicine

## 2023-09-20 DIAGNOSIS — N1831 Chronic kidney disease, stage 3a: Secondary | ICD-10-CM

## 2023-09-20 DIAGNOSIS — E119 Type 2 diabetes mellitus without complications: Secondary | ICD-10-CM

## 2023-10-27 ENCOUNTER — Ambulatory Visit: Admitting: Family Medicine

## 2023-11-07 ENCOUNTER — Ambulatory Visit: Admitting: Family Medicine

## 2023-11-08 ENCOUNTER — Other Ambulatory Visit: Payer: Self-pay | Admitting: Family Medicine

## 2023-11-29 ENCOUNTER — Encounter: Payer: Self-pay | Admitting: Family Medicine

## 2023-11-29 ENCOUNTER — Ambulatory Visit (INDEPENDENT_AMBULATORY_CARE_PROVIDER_SITE_OTHER): Admitting: Family Medicine

## 2023-11-29 VITALS — BP 116/68 | HR 101 | Temp 97.4°F | Ht 62.0 in | Wt 135.4 lb

## 2023-11-29 DIAGNOSIS — Z23 Encounter for immunization: Secondary | ICD-10-CM

## 2023-11-29 DIAGNOSIS — D509 Iron deficiency anemia, unspecified: Secondary | ICD-10-CM

## 2023-11-29 DIAGNOSIS — N1831 Chronic kidney disease, stage 3a: Secondary | ICD-10-CM | POA: Diagnosis not present

## 2023-11-29 DIAGNOSIS — E119 Type 2 diabetes mellitus without complications: Secondary | ICD-10-CM

## 2023-11-29 LAB — BASIC METABOLIC PANEL WITH GFR
BUN: 25 mg/dL — ABNORMAL HIGH (ref 6–23)
CO2: 26 meq/L (ref 19–32)
Calcium: 10.8 mg/dL — ABNORMAL HIGH (ref 8.4–10.5)
Chloride: 102 meq/L (ref 96–112)
Creatinine, Ser: 1.67 mg/dL — ABNORMAL HIGH (ref 0.40–1.20)
GFR: 26.05 mL/min — ABNORMAL LOW (ref >60.00)
Glucose, Bld: 179 mg/dL — ABNORMAL HIGH (ref 70–99)
Potassium: 3.8 meq/L (ref 3.5–5.1)
Sodium: 138 meq/L (ref 135–145)

## 2023-11-29 LAB — CBC
HCT: 41 % (ref 36.0–46.0)
Hemoglobin: 12.4 g/dL (ref 12.0–15.0)
MCHC: 30.3 g/dL (ref 30.0–36.0)
MCV: 75.9 fl — ABNORMAL LOW (ref 78.0–100.0)
Platelets: 118 K/uL — ABNORMAL LOW (ref 150.0–400.0)
RBC: 5.4 Mil/uL — ABNORMAL HIGH (ref 3.87–5.11)
RDW: 17.2 % — ABNORMAL HIGH (ref 11.5–15.5)
WBC: 4.4 K/uL (ref 4.0–10.5)

## 2023-11-29 LAB — HEMOGLOBIN A1C: Hgb A1c MFr Bld: 6.6 % — ABNORMAL HIGH (ref 4.6–6.5)

## 2023-11-29 NOTE — Progress Notes (Signed)
 Established Patient Office Visit   Subjective:  Patient ID: Chelsea Dean, female    DOB: [birth year]  Age: 89 y.o. MRN: [id number]  Chief Complaint  Patient presents with   Medical Management of Chronic Issues    8 week follow up. Pt is not fasting.     HPI Encounter Diagnoses  Name Primary?   Stage 3a chronic kidney disease (HCC) Yes   Microcytic anemia    Type 2 diabetes mellitus without complication, without long-term current use of insulin  (HCC)    Immunization due    Feeling okay and appetite is good.  No melena or hematuria status post discontinue of Eliquis .  Blood pressure improved off of her antihypertensive medications.  As usual, she is at peace.   Review of Systems  Constitutional: Negative.   HENT: Negative.    Eyes:  Negative for blurred vision, discharge and redness.  Respiratory: Negative.    Cardiovascular: Negative.   Gastrointestinal:  Negative for abdominal pain.  Genitourinary: Negative.   Musculoskeletal: Negative.  Negative for myalgias.  Skin:  Negative for rash.  Neurological:  Negative for tingling, loss of consciousness and weakness.  Endo/Heme/Allergies:  Negative for polydipsia.     Current Outpatient Medications:    acetaminophen  (TYLENOL ) 650 MG CR tablet, Take 650-1,300 mg by mouth every 8 (eight) hours as needed for pain., Disp: , Rfl:    benzonatate  (TESSALON ) 200 MG capsule, Take 1 capsule (200 mg total) by mouth 2 (two) times daily as needed for cough., Disp: 20 capsule, Rfl: 0   Cyanocobalamin  (B-12) 1000 MCG TABS, TAKE 1 TABLET BY MOUTH EVERY DAY, Disp: 100 tablet, Rfl: 0   diclofenac sodium (VOLTAREN) 1 % GEL, Apply 2 g topically daily as needed (KNEE PAIN)., Disp: , Rfl:    gabapentin  (NEURONTIN ) 100 MG capsule, Take 1 capsule (100 mg total) by mouth at bedtime., Disp: 30 capsule, Rfl: 3   GNP MAGNESIUM  OXIDE 250 MG TABS, TAKE 1 TABLET BY MOUTH EVERY DAY, Disp: 100 tablet, Rfl: 0   GNP VITAMIN B-12 1000 MCG TBCR, TAKE 1 TABLET BY  MOUTH EVERY DAY (Patient not taking: Reported on 08/15/2023), Disp: 90 tablet, Rfl: 1   Iron , Ferrous Sulfate , 325 (65 Fe) MG TABS, Take one twice daily by mouth if possible, Disp: 60 tablet, Rfl: 2   JARDIANCE  10 MG TABS tablet, TAKE 1 TABLET BY MOUTH EVERY MORNING BEFORE BREAKFAST, Disp: 90 tablet, Rfl: 0   Magnesium  250 MG CAPS, Take 250 mg by mouth daily., Disp: 90 capsule, Rfl: 2   Multiple Vitamin (MULTIVITAMIN) tablet, Take 1 tablet by mouth daily., Disp: , Rfl:    potassium chloride  SA (KLOR-CON  M) 20 MEQ tablet, Take 1 tablet (20 mEq total) by mouth every other day., Disp: 45 tablet, Rfl: 3   traMADol  (ULTRAM ) 50 MG tablet, TAKE 1 TABLET BY MOUTH EVERY 12 HOURS ASNEEDED FOR PAIN, Disp: 60 tablet, Rfl: 0   Objective:     BP 116/68 (BP Location: Right Arm, Patient Position: Sitting, Cuff Size: Normal)   Pulse (!) 101   Temp (!) 97.4 F (36.3 C) (Temporal)   Ht 5' 2 (1.575 m)   Wt 135 lb 6.4 oz (61.4 kg)   SpO2 97%   BMI 24.76 kg/m  BP Readings from Last 3 Encounters:  11/29/23 116/68  09/12/23 98/62  08/15/23 98/62   Wt Readings from Last 3 Encounters:  11/29/23 135 lb 6.4 oz (61.4 kg)  09/12/23 137 lb 12.8 oz (62.5 kg)  08/15/23 143  lb (64.9 kg)      Physical Exam Constitutional:      General: She is not in acute distress.    Appearance: Normal appearance. She is not ill-appearing, toxic-appearing or diaphoretic.  HENT:     Head: Normocephalic and atraumatic.     Right Ear: External ear normal.     Left Ear: External ear normal.  Eyes:     General: No scleral icterus.       Right eye: No discharge.        Left eye: No discharge.     Extraocular Movements: Extraocular movements intact.     Conjunctiva/sclera: Conjunctivae normal.  Cardiovascular:     Rate and Rhythm: Normal rate and regular rhythm.  Pulmonary:     Effort: Pulmonary effort is normal. No respiratory distress.     Breath sounds: No wheezing or rales.  Skin:    General: Skin is warm and dry.   Neurological:     Mental Status: She is alert and oriented to person, place, and time.  Psychiatric:        Mood and Affect: Mood normal.        Behavior: Behavior normal.      No results found for any visits on 11/29/23.    The ASCVD Risk score (Arnett DK, et al., 2019) failed to calculate for the following reasons:   The 2019 ASCVD risk score is only valid for ages 62 to 80    Assessment & Plan:   Stage 3a chronic kidney disease (HCC) -     Basic metabolic panel with GFR  Microcytic anemia -     CBC  Type 2 diabetes mellitus without complication, without long-term current use of insulin  (HCC) -     Basic metabolic panel with GFR -     Hemoglobin A1c  Immunization due -     Flu vaccine HIGH DOSE PF(Fluzone Trivalent)    Return in about 3 months (around 02/29/2024).    [name], MD

## 2023-12-01 ENCOUNTER — Ambulatory Visit: Payer: Self-pay | Admitting: Family Medicine

## 2023-12-26 ENCOUNTER — Other Ambulatory Visit: Payer: Self-pay | Admitting: Family Medicine

## 2023-12-26 DIAGNOSIS — E119 Type 2 diabetes mellitus without complications: Secondary | ICD-10-CM

## 2023-12-26 DIAGNOSIS — N1831 Chronic kidney disease, stage 3a: Secondary | ICD-10-CM

## 2023-12-26 NOTE — Telephone Encounter (Signed)
 Medication refill request for Jardiance  10mg  LOV 11/29/2023 FOV 02/28/2024  Last rf 09/20/2023. Med sent

## 2024-02-28 ENCOUNTER — Encounter: Payer: Self-pay | Admitting: Family Medicine

## 2024-02-28 ENCOUNTER — Ambulatory Visit: Admitting: Family Medicine

## 2024-02-28 VITALS — BP 126/72 | HR 86 | Ht 62.0 in | Wt 134.8 lb

## 2024-02-28 DIAGNOSIS — D509 Iron deficiency anemia, unspecified: Secondary | ICD-10-CM | POA: Diagnosis not present

## 2024-02-28 DIAGNOSIS — N1832 Chronic kidney disease, stage 3b: Secondary | ICD-10-CM

## 2024-02-28 DIAGNOSIS — E119 Type 2 diabetes mellitus without complications: Secondary | ICD-10-CM | POA: Diagnosis not present

## 2024-02-28 LAB — CBC
HCT: 40.3 % (ref 36.0–46.0)
Hemoglobin: 12.6 g/dL (ref 12.0–15.0)
MCHC: 31.2 g/dL (ref 30.0–36.0)
MCV: 76.1 fl — ABNORMAL LOW (ref 78.0–100.0)
Platelets: 113 K/uL — ABNORMAL LOW (ref 150.0–400.0)
RBC: 5.29 Mil/uL — ABNORMAL HIGH (ref 3.87–5.11)
RDW: 17.3 % — ABNORMAL HIGH (ref 11.5–15.5)
WBC: 4.2 K/uL (ref 4.0–10.5)

## 2024-02-28 LAB — BASIC METABOLIC PANEL WITH GFR
BUN: 23 mg/dL (ref 6–23)
CO2: 26 meq/L (ref 19–32)
Calcium: 10.6 mg/dL — ABNORMAL HIGH (ref 8.4–10.5)
Chloride: 101 meq/L (ref 96–112)
Creatinine, Ser: 1.46 mg/dL — ABNORMAL HIGH (ref 0.40–1.20)
GFR: 30.56 mL/min — ABNORMAL LOW
Glucose, Bld: 106 mg/dL — ABNORMAL HIGH (ref 70–99)
Potassium: 4.2 meq/L (ref 3.5–5.1)
Sodium: 137 meq/L (ref 135–145)

## 2024-02-28 LAB — HEMOGLOBIN A1C: Hgb A1c MFr Bld: 6.3 % (ref 4.6–6.5)

## 2024-02-28 NOTE — Progress Notes (Signed)
 "  Established Patient Office Visit   Subjective:  Patient ID: Chelsea Dean, female    DOB: [birth year]  Age: 89 y.o. MRN: [id number]  Chief Complaint  Patient presents with   Follow-up    No concerns     HPI Encounter Diagnoses  Name Primary?   Type 2 diabetes mellitus without complication, without long-term current use of insulin  (HCC) Yes   Microcytic anemia    Stage 3b chronic kidney disease (HCC)    Doing okay.  No rectal bleeding or melena.  Continues medications as above.  Accompanied by her daughter Jonette who is her primary caregiver.  She is comfortable at home.   Review of Systems  Constitutional: Negative.   HENT: Negative.    Eyes:  Negative for blurred vision, discharge and redness.  Respiratory: Negative.    Cardiovascular: Negative.   Gastrointestinal:  Negative for abdominal pain, blood in stool and melena.  Genitourinary: Negative.   Musculoskeletal: Negative.  Negative for myalgias.  Skin:  Negative for rash.  Neurological:  Negative for tingling, loss of consciousness and weakness.  Endo/Heme/Allergies:  Negative for polydipsia.    Current Medications[1]   Objective:     BP 126/72 (BP Location: Left Arm, Patient Position: Sitting, Cuff Size: Normal)   Pulse 86   Ht 5' 2 (1.575 m)   Wt 134 lb 12.8 oz (61.1 kg)   SpO2 100%   BMI 24.66 kg/m  BP Readings from Last 3 Encounters:  02/28/24 126/72  11/29/23 116/68  09/12/23 98/62   Wt Readings from Last 3 Encounters:  02/28/24 134 lb 12.8 oz (61.1 kg)  11/29/23 135 lb 6.4 oz (61.4 kg)  09/12/23 137 lb 12.8 oz (62.5 kg)      Physical Exam Constitutional:      General: She is not in acute distress.    Appearance: Normal appearance. She is not ill-appearing, toxic-appearing or diaphoretic.  HENT:     Head: Normocephalic and atraumatic.     Right Ear: External ear normal.     Left Ear: External ear normal.  Eyes:     General: No scleral icterus.       Right eye: No discharge.        Left  eye: No discharge.     Extraocular Movements: Extraocular movements intact.     Conjunctiva/sclera: Conjunctivae normal.  Cardiovascular:     Rate and Rhythm: Normal rate and regular rhythm.  Pulmonary:     Effort: Pulmonary effort is normal. No respiratory distress.     Breath sounds: No wheezing, rhonchi or rales.  Skin:    General: Skin is warm and dry.  Neurological:     Mental Status: She is alert and oriented to person, place, and time.  Psychiatric:        Mood and Affect: Mood normal.        Behavior: Behavior normal.      No results found for any visits on 02/28/24.    The ASCVD Risk score (Arnett DK, et al., 2019) failed to calculate for the following reasons:   The 2019 ASCVD risk score is only valid for ages 2 to 9   * - Cholesterol units were assumed    Assessment & Plan:   Type 2 diabetes mellitus without complication, without long-term current use of insulin  (HCC) -     Basic metabolic panel with GFR -     Hemoglobin A1c  Microcytic anemia -     CBC  Stage 3b chronic  kidney disease (HCC) -     Basic metabolic panel with GFR    Return in about 3 months (around 05/28/2024), or if symptoms worsen or fail to improve.    Chelsea Sim Lent, MD    [1]  Current Outpatient Medications:    acetaminophen  (TYLENOL ) 650 MG CR tablet, Take 650-1,300 mg by mouth every 8 (eight) hours as needed for pain., Disp: , Rfl:    benzonatate  (TESSALON ) 200 MG capsule, Take 1 capsule (200 mg total) by mouth 2 (two) times daily as needed for cough., Disp: 20 capsule, Rfl: 0   Cyanocobalamin  (B-12) 1000 MCG TABS, TAKE 1 TABLET BY MOUTH EVERY DAY, Disp: 100 tablet, Rfl: 0   diclofenac sodium (VOLTAREN) 1 % GEL, Apply 2 g topically daily as needed (KNEE PAIN)., Disp: , Rfl:    gabapentin  (NEURONTIN ) 100 MG capsule, Take 1 capsule (100 mg total) by mouth at bedtime., Disp: 30 capsule, Rfl: 3   GNP MAGNESIUM  OXIDE 250 MG TABS, TAKE 1 TABLET BY MOUTH EVERY DAY, Disp: 100  tablet, Rfl: 0   GNP VITAMIN B-12 1000 MCG TBCR, TAKE 1 TABLET BY MOUTH EVERY DAY, Disp: 90 tablet, Rfl: 1   Iron , Ferrous Sulfate , 325 (65 Fe) MG TABS, Take one twice daily by mouth if possible, Disp: 60 tablet, Rfl: 2   JARDIANCE  10 MG TABS tablet, TAKE 1 TABLET BY MOUTH EVERY MORNING BEFORE BREAKFAST, Disp: 90 tablet, Rfl: 0   Magnesium  250 MG CAPS, Take 250 mg by mouth daily., Disp: 90 capsule, Rfl: 2   Multiple Vitamin (MULTIVITAMIN) tablet, Take 1 tablet by mouth daily., Disp: , Rfl:    potassium chloride  SA (KLOR-CON  M) 20 MEQ tablet, Take 1 tablet (20 mEq total) by mouth every other day., Disp: 45 tablet, Rfl: 3   traMADol  (ULTRAM ) 50 MG tablet, TAKE 1 TABLET BY MOUTH EVERY 12 HOURS ASNEEDED FOR PAIN, Disp: 60 tablet, Rfl: 0  "

## 2024-03-01 ENCOUNTER — Ambulatory Visit: Payer: Self-pay | Admitting: Family Medicine

## 2024-05-28 ENCOUNTER — Ambulatory Visit: Admitting: Family Medicine

## 2024-09-14 ENCOUNTER — Ambulatory Visit
